# Patient Record
Sex: Male | Born: 1945 | Race: Black or African American | Hispanic: No | Marital: Married | Smoking: Former smoker
Health system: Southern US, Community
[De-identification: ages and names within clinical notes are randomized; demographics above are authoritative.]

## PROBLEM LIST (undated history)

## (undated) ENCOUNTER — Emergency Department (HOSPITAL_COMMUNITY): Admission: EM | Disposition: A | Discharge status: Home / Self Care

## (undated) DIAGNOSIS — I10 Essential (primary) hypertension: Secondary | ICD-10-CM

## (undated) DIAGNOSIS — R519 Headache, unspecified: Secondary | ICD-10-CM

## (undated) DIAGNOSIS — R51 Headache: Secondary | ICD-10-CM

## (undated) DIAGNOSIS — N400 Enlarged prostate without lower urinary tract symptoms: Secondary | ICD-10-CM

## (undated) DIAGNOSIS — M1990 Unspecified osteoarthritis, unspecified site: Secondary | ICD-10-CM

## (undated) HISTORY — PX: JOINT REPLACEMENT: SHX530

---

## 1998-06-08 ENCOUNTER — Ambulatory Visit (HOSPITAL_BASED_OUTPATIENT_CLINIC_OR_DEPARTMENT_OTHER): Admission: RE | Admit: 1998-06-08 | Discharge: 1998-06-08 | Payer: Self-pay | Admitting: Orthopedic Surgery

## 2000-05-14 HISTORY — PX: BACK SURGERY: SHX140

## 2001-01-06 ENCOUNTER — Encounter: Payer: Self-pay | Admitting: Family Medicine

## 2001-01-06 ENCOUNTER — Ambulatory Visit (HOSPITAL_COMMUNITY): Admission: RE | Admit: 2001-01-06 | Discharge: 2001-01-06 | Payer: Self-pay | Admitting: Family Medicine

## 2001-04-03 ENCOUNTER — Inpatient Hospital Stay (HOSPITAL_COMMUNITY): Admission: RE | Admit: 2001-04-03 | Discharge: 2001-04-07 | Payer: Self-pay | Admitting: Neurosurgery

## 2002-11-27 ENCOUNTER — Ambulatory Visit (HOSPITAL_COMMUNITY): Admission: RE | Admit: 2002-11-27 | Discharge: 2002-11-27 | Payer: Self-pay | Admitting: Gastroenterology

## 2002-11-27 ENCOUNTER — Encounter (INDEPENDENT_AMBULATORY_CARE_PROVIDER_SITE_OTHER): Payer: Self-pay | Admitting: *Deleted

## 2004-05-14 HISTORY — PX: KNEE ARTHROSCOPY: SHX127

## 2004-05-23 ENCOUNTER — Ambulatory Visit (HOSPITAL_COMMUNITY): Admission: RE | Admit: 2004-05-23 | Discharge: 2004-05-23 | Payer: Self-pay | Admitting: Orthopedic Surgery

## 2004-05-23 ENCOUNTER — Ambulatory Visit (HOSPITAL_BASED_OUTPATIENT_CLINIC_OR_DEPARTMENT_OTHER): Admission: RE | Admit: 2004-05-23 | Discharge: 2004-05-23 | Payer: Self-pay | Admitting: Orthopedic Surgery

## 2007-05-15 HISTORY — PX: SHOULDER ARTHROSCOPY WITH OPEN ROTATOR CUFF REPAIR: SHX6092

## 2007-06-03 ENCOUNTER — Ambulatory Visit (HOSPITAL_BASED_OUTPATIENT_CLINIC_OR_DEPARTMENT_OTHER): Admission: RE | Admit: 2007-06-03 | Discharge: 2007-06-04 | Payer: Self-pay | Admitting: Orthopedic Surgery

## 2010-09-26 NOTE — Op Note (Signed)
NAMECONSTANTINE, RUDDICK                 ACCOUNT NO.:  [id number]   MEDICAL RECORD NO.:  [id number]          PATIENT TYPE:  AMB   LOCATION:  DSC                          FACILITY:  [facility]   PHYSICIAN:  Katy Fitch. Sypher, M.D. 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   OPERATION:  1. Examination of right shoulder under anesthesia documenting      stability of the glenohumeral joint.  2. Arthroscopic evaluation of the glenohumeral joint, followed by      debridement of labrum and deep surface of rotator cuff tear, also      documenting 20% degenerative tear of biceps tendon just proximal to      rotator interval.  3. Arthroscopic subacromial decompression with bursectomy,      coracoacromial ligament relaxation and acromioplasty.  4. Arthroscopic resection of distal clavicle.  5. Open reconstruction of retracted rotator cuff tear including entire      supraspinatus and infraspinatus tendons.  This tear was      approximately 5 cm wide.   INDICATIONS:  Christoper Bushey is a 65 year old gentleman referred through  the courtesy of Dr. Catha Gosselin for evaluation and management of a  painful right shoulder.  Clinical examination revealed signs of  impingement and weakness of abduction.  Plain x-rays documented AC  arthritis.  There was sclerosis of the greater tuberosity bony with  overgrowth consistent with a chronic rotator cuff tear.  An MRI of the  right shoulder was obtained documenting a retracted, significant rotator  cuff tear and AC  degenerative arthritis.   After informed consent, Mr. Baize is brought to the operating room at  this time.   PROCEDURE:  Tanav Orsak is brought to the operating room and placed in a  supine position on the operating table.   Following an anesthesia consult by Dr. Sampson Goon in the holding area, a  right interscalene block was placed with excellent anesthesia of the  right arm and forequarter.  It appeared that a phrenic nerve block had  occurred as Mr. Bowring did notice slight air hunger.   After detailed informed consent during which questions were invited and  answered, Mr. Legan was transferred to room 6 at the [facility] surgical  center and under Dr. Jarrett Ables direct supervision placed under  general endotracheal anesthesia.  He was carefully positioned in the  beach-chair position with the aid of a torso and head holder designed  for shoulder arthroscopy.   The entire right upper extremity and forequarter were prepped with  DuraPrep and draped with impervious arthroscopy drapes.   The procedure commenced with examination of the shoulder under  anesthesia.  His combined elevation was 180 degrees, external rotation  of  95 degrees, internal rotation of 80.  He was stable in all planes of  stress testing.   The arthroscope was then introduced through a standard posterior viewing  portal.  Diagnostic arthroscopy revealed intact hyaline articular  cartilage surfaces on the glenoid and humeral head.  The labrum was  degenerative from 2 o'clock anteriorly to 10 o'clock posteriorly.  An  anterior portal was created under direct vision, followed by debridement  of the labrum.  The rotator cuff was examined.  The subscapularis was  intact.  The supraspinatus was completely released from the greater  tuberosity and degenerative.  The infraspinatus was completely released  and the greater tuberosity retracted and degenerative.  The teres minor  was intact.  The biceps had a 20%  undersurface tear.  The 4.5-mm suction  shaver was used through the anterior portal to debride the biceps and  deep surface of the rotator cuff.  Synovitis within the joint was  debrided.  Hemostasis was achieved with bipolar cautery, followed by  removal of the arthroscopic equipment.  The scope was placed in the  subacromial space.  After bursectomy, the coracoacromial ligament was  released with the cutting cautery, followed by leveling of the acromion  to a type 1 morphology.  The capsule of the [facility] joint was worn through  and the spur at the distal clavicle was documented with the digital  camera.  After release of the coracoacromial ligament, a significant  spur was noted at the anterior acromion that had grown within the  ligament.  The acromial spur was leveled, as was the spur at the  inferior aspect of the distal clavicle.  The distal 15 mm of clavicle  was removed arthroscopically with the suction bur.  Hemostasis was  achieved with the bipolar cautery.   As this tear was significantly retracted and degenerative, in my  judgment open repair had greater merit with through-bone suture  technique.  Therefore, we remove the arthroscopic equipment and  proceeded with a 4-cm muscle-splitting incision.  The anterior and  middle thirds of the deltoid were split, followed by identification of  the tear.  The edges were freshened with a scalpel and rongeur, followed  by placement of a grasping McLaughlin through-bone suture that was  brought through drill holes over the lateral cortex of the humerus.  To  biocorkscrew anchors, one placed at the medial footprint of the  infraspinatus, one at the medial footprint the supraspinatus, were  placed.  Suture placement was carefully done to avoid the long head of  the biceps.   The tear was advanced laterally to an anatomic footprint with the  [facility] through-bone suture, followed by tying four mattress sutures,  insetting the margin of the  repair.  The suture pairs anteriorly and  posteriorly were then used with an over-the-top technique utilizing a  swivel lock, insetting the repair on the greater tuberosity.   A very satisfactory construct was achieved.   The subacromial space was then thoroughly irrigated with the power  irrigation, followed by use of a hand bur to feather the edge of the  acromion.  The deltoid split was then repaired with a corner suture of  #2 FiberWire, followed by simple suture of 0 Vicryl.  The skin was  repaired with subdermal suture of 2-0 Vicryl and intradermal 3-0 Prolene  with Steri-Strips.  Mr. Klee was placed in a sling and awakened from  general anesthesia, subsequently transferred to the Post Anesthesia Care  Unit for observation  of his vital signs.  We anticipate discharge to the  recovery care center for observation for 23 hours.  During this period  of time he will be provided Ancef 1 g IV q.8h.  x3 doses and appropriate analgesics in the form of p.o. and IV Dilaudid  as well as possible use of IV PCA morphine.  There were no apparent  complications.  Passive calf compression pumps were used for deep vein  thrombosis prophylaxis.  We will mobilize Mr. Gerding immediately  postoperatively.      Katy Fitch Sypher, M.D.  Electronically Signed     RVS/MEDQ  D:  06/03/2007  T:  06/04/2007  Job:  [id number]   cc:   Caryn Bee L. Little, M.D.

## 2010-09-29 NOTE — Op Note (Signed)
NAMECAMDON, SAETERN                 ACCOUNT NO.:  [id number]   MEDICAL RECORD NO.:  [id number]          PATIENT TYPE:  AMB   LOCATION:  DSC                          FACILITY:  [facility]   PHYSICIAN:  Feliberto Gottron. Turner Daniels, M.D.   DATE OF BIRTH:  [birth year]   DATE OF PROCEDURE:  05/23/2004  DATE OF DISCHARGE:                                 OPERATIVE REPORT   New that this Homero Fellers running the pain.   OPERATIVE NOTE:   PREOPERATIVE DIAGNOSIS:  Left knee medial meniscal tear, focal grade III.   POSTOPERATIVE DIAGNOSIS:  Left knee medial meniscal tear, focal grade III.   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   SURGEON:  Feliberto Gottron. Turner Daniels, M.D.   FIRST ASSISTANT:  None.   ANESTHETIC:  Local with IV sedation.   ESTIMATED BLOOD LOSS:  Minimal.   FLUID REPLACEMENT:  800 cc crystalloid.   TOURNIQUET TIME:  None.   INDICATIONS FOR PROCEDURE:  The patient is a 65 year old gentleman who has  had prior arthroscopy of his left knee some years ago,  Retwisted or  reinjured his knee recently, has had a recurrent medial joint line pain,  catching and popping.  Plain radiographs showed fairly normal cartilage site  but his signs and symptoms are classic for a medial meniscal tear.  He has  failed conservative treatment  with anti-inflammatory medicines,  observation,  cortisone injection provided temporary relief. He now desires  repeat elective arthroscopic evaluation and treatment of his left knee  accepting the risks, benefits of surgery.   DESCRIPTION OF PROCEDURE:  The patient identified by armband, taken the  operating room at [facility]. [facility].  Appropriate anesthetic monitors were attached. Local anesthesia with IV  sedation was induced with the patient in supine position and the left lower  extremity prepped and draped in usual sterile fashion from the ankle  to the  mid thigh.  Lateral post was applied to the table prior to the prep and  drape. Using a #11 blade standard inferomedial and inferolateral  peripatellar portals were then made allowing introduction of the arthroscope  through the inferolateral portal and the outflow through the inferomedial  portal. Diagnostic arthroscopy revealed a fairly normal patellofemoral  joint. Moving of medial side grade III chondromalacia near  the notch was  identified and debrided.  A posterior medial fairly big tear the medial  meniscus was identified debrided with straight biters, a 3.5 gator sucker  shaver as well as a 4.4 [place]  sucker shaver. The ACL was noted to be  intact on the lateral side.  There was an incidental fraying of the  lateral  meniscus which was lightly debrided. The  knee was then irrigated out with normal saline solution and the gutters were  cleared medially and laterally as were the posterior horns. The arthroscopic  instruments were removed. A dressing of Xeroform, 4x4 dressing sponges,  Webril and Ace wrap applied. The patient was then awakened and taken to the  recovery room without difficulty.      Emilio Aspen  D:  05/23/2004  T:  05/23/2004  Job:  [id number]

## 2010-09-29 NOTE — Op Note (Signed)
[place]. [facility]  Patient:    KIZER, NOBBE Visit Number: [id number] MRN: [id number]          Service Type: SUR Location: 3000 [id number] Attending Physician:  Cristi Loron Dictated by:   Cristi Loron, M.D. Proc. Date: 04/03/01 Admit Date:  04/03/2001                             Operative Report  PREOPERATIVE DIAGNOSES:  L2-3, L3-4, L4-5, L5-S1 spinal stenosis, degenerative disk disease, spondylosis, lumbago, lumbar radiculopathy.  POSTOPERATIVE DIAGNOSES:  L2-3, L3-4, L4-5, L5-S1 spinal stenosis, degenerative disk disease, spondylosis, lumbago, lumbar radiculopathy.  PROCEDURES: 1. Bilateral L3, L4, and L5 extensive laminectomy. 2. Bilateral laminotomy and foraminotomy at L2 using microdissection.  SURGEON:  Cristi Loron, M.D.  ANESTHESIA:  General endotracheal.  ESTIMATED BLOOD LOSS:  300 cc.  SPECIMENS:  None.  DRAINS:  None.  COMPLICATIONS:  None.  BRIEF HISTORY:  The patient is a 65 year old black male who suffers from back and leg pain.  He failed medical management and was worked up as an outpatient with a lumbar MRI.  It demonstrated significant lumbar spinal stenosis.  I therefore discussed treatment options with him and he decided to proceed with a decompressive lumbar laminectomy.  DESCRIPTION OF PROCEDURE:  The patient was brought to the operating room by the anesthesia team.  General endotracheal anesthesia was induced.  The patient was then turned to the prone position on the Wilson frame.  His lumbosacral region was then prepared with Betadine scrub and Betadine solution and sterile drapes were applied.  I then injected the area to be incised with Marcaine with epinephrine solution.  I used a scalpel to make a midline incision over the spinous processes of L2 to the sacrum.  I used electrocautery to dissect down to the thoracolumbar fascia and divided it bilaterally, performing a bilateral subperiosteal  dissection, stripping the paraspinous musculature from the spinous processes and laminae of L2 down to the upper sacrum.  I inserted the McCullough retractor for exposure.  I then obtained an intraoperative radiograph to confirm our location.  I then used to rongeurs to remove the spinous process of L3, L4, and L5.  I then used the high-speed drill to perform bilateral laminotomies at L2, L3, L4, and L5.  I then brought the operating microscope into the field and under its magnification and illumination, I completed the decompression.  I used the Kerrison punch to remove the L5-S1 ligamentum flavum.  I then completed the L5 laminectomy and removed the L4-5 ligamentum flavum, then completed the L4 laminectomy and removed the L3-4 ligamentum flavum, and then completed the L3 laminectomy and then removed the L2-3 ligamentum flavum.  I then used microdissection to free up the thecal sac and the nerve roots from the hypertrophied ligamentum flavum and facet joints and in the lateral recesses bilaterally and then used the Kerrison punch to remove the hypertrophied ligament and medial facet bilaterally, decompressing the thecal sac from L2 to the upper sacrum and performing foraminotomies about the bilateral L3, L4, L5, and S1 nerve roots.  I then palpated along the exit route of the nerve roots bilaterally with a nerve hook and noted that they were well-decompressed.  Of note, the worst area of compression was around the right L5 nerve root, around which there was severe foraminal stenosis, which I opened up using the Kerrison punch.  I then inspected the  dura and noted that there was a small thinned area at L4-5 posteriorly.  There was no spinal fluid leaking, and I laid a piece of DuraGen over the area of thinned dura.  I then copiously irrigated the wound with bacitracin solution and removed the solution.  I achieved stringent hemostasis using bipolar electrocautery and removed the McCullough  retractor and reapproximated the patients thoracolumbar fascia with interrupted 3-0 Vicryl suture, the subcutaneous tissue with interrupted 2-0 Vicryl suture, and the skin with Steri-Strips and benzoin.  The wound was then coated with bacitracin ointment, and a sterile dressing was applied, the drapes were removed, and the patient was subsequently returned to the supine position, where he was extubated by the anesthesia team and transported to the postanesthesia care unit in stable condition.  All sponge, instrument, and needle counts were correct at the end of the case. Dictated by:   Cristi Loron, M.D. Attending Physician:  Tressie Stalker D DD:  04/03/01 TD:  04/03/01 Job: [id number]

## 2010-09-29 NOTE — Op Note (Signed)
   NAME:  Jared [name], Jared [name]                           ACCOUNT NO.:  [id number]   MEDICAL RECORD NO.:  [id number]                   PATIENT TYPE:  AMB   LOCATION:  ENDO                                 FACILITY:  [facility]   PHYSICIAN:  Petra Kuba, M.D.                 DATE OF BIRTH:  [birth year]   DATE OF PROCEDURE:  11/27/2002  DATE OF DISCHARGE:                                 OPERATIVE REPORT   PROCEDURE:  Colonoscopy with polypectomy.   INDICATIONS:  History of colon polyp, family history of colon cancer in a  sister at a young age.  Consent was signed after risks, benefits, methods,  and options thoroughly discussed in the office in the past.   MEDICINES USED:  Demerol 60 mg, Versed 6 mg.   PROCEDURE:  Rectal inspection was pertinent for external hemorrhoids, small.  Digital exam was negative.  The video regular colonoscope was inserted and  then fairly easily advanced around the colon to the cecum.  This did require  some abdominal pressure and no position changes.  No abnormalities were seen  on insertion.  The cecum was identified by the appendiceal orifice and the  ileocecal valve.  Prep was fairly adequate, did require some washing and  suctioning for adequate visualization.  On slow withdrawal through the  colon, the cecum, ascending, and transverse were normal.  There was a rare  occasional left-sided diverticulum seen as we slowly withdrew back to the  rectum and there were also four tiny to small left-sided polyps, which were  all hot biopsied and put in the same container.  Anorectal pull-through and  retroflexion confirm small hemorrhoids.  The scope was straightened and  readvanced a short way into the left side of the colon, air was suctioned,  the scope removed.  The patient tolerated the procedure well.  There was no  obvious immediate complication.   ENDOSCOPIC DIAGNOSES:  1. Internal-external hemorrhoids.  2. Occasional rare left-sided diverticula.  3. Four tiny to  small left-sided polyps, hot biopsied.  4. Otherwise within normal limits to the cecum.   PLAN:  Happy to see back p.r.n.  Return care to Dr. Clarene Duke for the customary  health care maintenance to include yearly rectals and guaiacs, and otherwise  await pathology to determine future colonic screening.                                               Petra Kuba, M.D.    MEM/MEDQ  D:  11/27/2002  T:  11/28/2002  Job:  [id number]   cc:   Caryn Bee L. Little, M.D.  [address]  [place]  [place] [address]  Fax: [phone]

## 2010-09-29 NOTE — Discharge Summary (Signed)
[place]. [facility]  Patient:    RESHAWN, OSTLUND Visit Number: [id number] MRN: [id number]          Service Type: SUR Location: 3000 [id number] Attending Physician:  Cristi Loron Dictated by:   Mena Goes. Franky Macho, M.D. Admit Date:  04/03/2001 Discharge Date: 04/07/2001                             Discharge Summary  ADMISSION DIAGNOSIS:  Right leg pain, numbness, and weakness.  DISCHARGE DIAGNOSIS:  Right leg pain, numbness, weakness, and lumbar spinal stenosis.  PROCEDURE:  L2-5 decompressive laminectomy.  COMPLICATIONS:  Postoperative fever.  HISTORY OF PRESENT ILLNESS:  Mr. Dambrosia is a 65 year old gentleman who was admitted to the hospital for right lower extremity pain.  He had spinal stenosis on his MRI and degenerative disk disease at L3-4 and L4-5 and L5-S1. Dr. Lovell Sheehan felt that his decompressive laminectomy would be beneficial and Mr. Weed agreed.  HOSPITAL COURSE:  He underwent simple procedure without difficulty. Postoperatively, Mr. Paolucci had normal neurologic examination.  His wound was clean, dry, and without signs of infection.  He did have postoperative fever. Urine culture was done and was negative.  Lung films showed some atelectasis. He had a white count of 8.5 versus 6.1 on admission.  He will be discharged home.  He is afebrile this morning at 97.1.  He will call for return appointment with Dr. Lovell Sheehan in about three weeks.  He was given instructions on heavy lifting, bending, or twisting, no driving. Dictated by:   Mena Goes. Franky Macho, M.D. Attending Physician:  Tressie Stalker D DD:  04/07/01 TD:  04/07/01 Job: [id number]

## 2011-02-01 LAB — BASIC METABOLIC PANEL
BUN: 14
CO2: 28
Calcium: 9.3
Chloride: 105
Creatinine, Ser: 1.21
GFR calc Af Amer: >60
GFR calc non Af Amer: >60
Glucose, Bld: 101 — ABNORMAL HIGH
Potassium: 4.4
Sodium: 139

## 2011-02-01 LAB — POCT HEMOGLOBIN-HEMACUE: Hemoglobin: 14.4

## 2011-04-02 ENCOUNTER — Encounter (HOSPITAL_COMMUNITY): Payer: Self-pay | Admitting: Pharmacy Technician

## 2011-04-09 ENCOUNTER — Encounter (HOSPITAL_COMMUNITY): Payer: Self-pay

## 2011-04-09 ENCOUNTER — Other Ambulatory Visit: Payer: Self-pay

## 2011-04-09 ENCOUNTER — Encounter (HOSPITAL_COMMUNITY)
Admission: RE | Admit: 2011-04-09 | Discharge: 2011-04-09 | Disposition: A | Discharge status: Home / Self Care | Payer: Medicare Other | Source: Ambulatory Visit | Attending: Anesthesiology | Admitting: Anesthesiology

## 2011-04-09 ENCOUNTER — Encounter (HOSPITAL_COMMUNITY)
Admission: RE | Admit: 2011-04-09 | Discharge: 2011-04-09 | Disposition: A | Discharge status: Home / Self Care | Payer: Medicare Other | Source: Ambulatory Visit | Attending: Orthopedic Surgery | Admitting: Orthopedic Surgery

## 2011-04-09 HISTORY — DX: Essential (primary) hypertension: I10

## 2011-04-09 HISTORY — DX: Unspecified osteoarthritis, unspecified site: M19.90

## 2011-04-09 LAB — CBC
HCT: 42.4 % (ref 39.0–52.0)
Hemoglobin: 14.6 g/dL (ref 13.0–17.0)
MCV: 91.2 fL (ref 78.0–100.0)
RDW: 12.6 % (ref 11.5–15.5)
WBC: 5.8 10*3/uL (ref 4.0–10.5)

## 2011-04-09 LAB — SURGICAL PCR SCREEN
MRSA, PCR: NEGATIVE
Staphylococcus aureus: NEGATIVE

## 2011-04-09 LAB — BASIC METABOLIC PANEL
BUN: 16 mg/dL (ref 6–23)
Chloride: 104 mEq/L (ref 96–112)
Creatinine, Ser: 1.33 mg/dL (ref 0.50–1.35)
Glucose, Bld: 88 mg/dL (ref 70–99)
Potassium: 4 mEq/L (ref 3.5–5.1)

## 2011-04-09 LAB — TYPE AND SCREEN
ABO/RH(D): O POS
Antibody Screen: NEGATIVE

## 2011-04-09 NOTE — Pre-Procedure Instructions (Signed)
20 Jared [name]  04/09/2011   Your procedure is scheduled on:  04-16-11 Monday  Report to [facility] Short Stay Center at 8:30 AM.  Call this number if you have problems the morning of surgery: [phone]   Remember:   Do not eat food:After Midnight.  May have clear liquids: up to 4 Hours before arrival.  Clear liquids include soda, tea, black coffee, apple or grape juice, broth.  Take these medicines the morning of surgery with A SIP OF WATER: amlodipine   Do not wear jewelry, make-up or nail polish.  Do not wear lotions, powders, or perfumes. You may wear deodorant.  Do not shave 48 hours prior to surgery.  Do not bring valuables to the hospital.  Contacts, dentures or bridgework may not be worn into surgery.  Leave suitcase in the car. After surgery it may be brought to your room.  For patients admitted to the hospital, checkout time is 11:00 AM the day of discharge.   Patients discharged the day of surgery will not be allowed to drive home.  Name and phone number of your driver: gwen Bergen - wife  [phone]  Special Instructions: CHG Shower Use Special Wash: 1/2 bottle night before surgery and 1/2 bottle morning of surgery.   Please read over the following fact sheets that you were given: Pain Booklet, Coughing and Deep Breathing, Blood Transfusion Information, MRSA Information and Surgical Site Infection Prevention

## 2011-04-10 ENCOUNTER — Other Ambulatory Visit: Payer: Self-pay | Admitting: Orthopedic Surgery

## 2011-04-12 NOTE — Progress Notes (Signed)
Rogelia Mire RN left message for [place] at Dr. Wadie Lessen office at 1025 today about no orders in [employer] or  [address]

## 2011-04-12 NOTE — H&P (Signed)
   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  He comes in today having pretty much reached his wit's end.  He works as both a Education officer, environmental and a custodian.  He is now having a great deal of trouble doing his job.  The pain wakes him up at night.  He has trouble getting in and out of a chair and if anything the varus deformity had previously has progressed.  PAST MEDICAL HISTORY:  Unchanged regarding anything like a stroke, heart attack.  He has no history of diabetes.  He is treated for high blood pressure.  Review of systems reviewed thoroughly and all other systems are negative as related to the chief complaint.  OBJECTIVE:  Well-nourished, well-developed 65 year old gentleman seated on the exam table in no apparent distress.  When he tries to walk he has obvious varus deformities, left greater than right knee. Both knees lack 10 degrees of full extension.  They flex to about 110 degrees.  Trace effusion on the left, none on the right.  Very tender along the medial joint line.  Hawkins test is negative.  RADIOGRAPHS:  X-rays were ordered, performed, and interpreted by me today.  Plain radiographs show end-stage arthritis, bone-on-bone, with actually some gas on the medial side of the joint on both knees and on the lateral view of the left knee you can actually see erosion of the femur into the tibia.  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  PLAN:  Options were discussed at length.  He has already failed anti-inflammatory medicines, cortisone injections.  He does not want to do Supartz injection because of the low success rate.  Therefore, we will get him set up for left total knee arthroplasty.  We will try to get that done at [facility] sooner than later.  Risks and benefits of surgery have been discussed and questioned.  Models are brought into the room and he is aware of what he is  getting himself into.

## 2011-04-13 ENCOUNTER — Other Ambulatory Visit: Payer: Self-pay | Admitting: Orthopedic Surgery

## 2011-04-13 NOTE — Progress Notes (Signed)
Left message on the voice mail of Jared [name](?) at Dr Wadie Lessen office and requested surgical orders again.

## 2011-04-16 ENCOUNTER — Encounter (HOSPITAL_COMMUNITY)
Admission: RE | Disposition: A | Discharge status: Home Health | Payer: Self-pay | Source: Ambulatory Visit | Attending: Orthopedic Surgery

## 2011-04-16 ENCOUNTER — Inpatient Hospital Stay (HOSPITAL_COMMUNITY): Payer: Medicare Other | Admitting: Anesthesiology

## 2011-04-16 ENCOUNTER — Inpatient Hospital Stay (HOSPITAL_COMMUNITY)
Admission: RE | Admit: 2011-04-16 | Discharge: 2011-04-18 | DRG: 470 | Disposition: A | Discharge status: Home Health | Payer: Medicare Other | Source: Ambulatory Visit | Attending: Orthopedic Surgery | Admitting: Orthopedic Surgery

## 2011-04-16 ENCOUNTER — Encounter (HOSPITAL_COMMUNITY): Payer: Self-pay | Admitting: Anesthesiology

## 2011-04-16 ENCOUNTER — Encounter (HOSPITAL_COMMUNITY): Payer: Self-pay | Admitting: *Deleted

## 2011-04-16 ENCOUNTER — Encounter (HOSPITAL_COMMUNITY): Payer: Self-pay | Admitting: Orthopedic Surgery

## 2011-04-16 DIAGNOSIS — M1712 Unilateral primary osteoarthritis, left knee: Secondary | ICD-10-CM

## 2011-04-16 DIAGNOSIS — I10 Essential (primary) hypertension: Secondary | ICD-10-CM | POA: Diagnosis present

## 2011-04-16 DIAGNOSIS — Z01812 Encounter for preprocedural laboratory examination: Secondary | ICD-10-CM

## 2011-04-16 DIAGNOSIS — M1710 Unilateral primary osteoarthritis, unspecified knee: Principal | ICD-10-CM | POA: Diagnosis present

## 2011-04-16 DIAGNOSIS — Z79899 Other long term (current) drug therapy: Secondary | ICD-10-CM

## 2011-04-16 DIAGNOSIS — Z7901 Long term (current) use of anticoagulants: Secondary | ICD-10-CM

## 2011-04-16 HISTORY — PX: TOTAL KNEE ARTHROPLASTY: SHX125

## 2011-04-16 LAB — PROTIME-INR
INR: 1.08 (ref 0.00–1.49)
Prothrombin Time: 14.2 seconds (ref 11.6–15.2)

## 2011-04-16 LAB — DIFFERENTIAL
Basophils Absolute: 0 10*3/uL (ref 0.0–0.1)
Basophils Relative: 0 % (ref 0–1)
Eosinophils Absolute: 0.3 10*3/uL (ref 0.0–0.7)
Eosinophils Relative: 5 % (ref 0–5)
Lymphocytes Relative: 64 % — ABNORMAL HIGH (ref 12–46)
Monocytes Absolute: 0.3 10*3/uL (ref 0.1–1.0)

## 2011-04-16 SURGERY — ARTHROPLASTY, KNEE, TOTAL
Anesthesia: Regional | Site: Knee | Laterality: Left | Wound class: Clean

## 2011-04-16 MED ORDER — THERA M PLUS PO TABS
1.0000 | ORAL_TABLET | Freq: Every day | ORAL | Status: DC
  Administered 2011-04-16 – 2011-04-18 (×3): 1 via ORAL
  Filled 2011-04-16 (×3): qty 1

## 2011-04-16 MED ORDER — HYDROMORPHONE HCL PF 1 MG/ML IJ SOLN
0.5000 mg | INTRAMUSCULAR | Status: DC | PRN
  Administered 2011-04-16 – 2011-04-17 (×4): 1 mg via INTRAVENOUS
  Filled 2011-04-16 (×3): qty 1

## 2011-04-16 MED ORDER — PHENOL 1.4 % MT LIQD
1.0000 | OROMUCOSAL | Status: DC | PRN
  Filled 2011-04-16: qty 177

## 2011-04-16 MED ORDER — EPHEDRINE SULFATE 50 MG/ML IJ SOLN
INTRAMUSCULAR | Status: DC | PRN
  Administered 2011-04-16: 5 mg via INTRAVENOUS

## 2011-04-16 MED ORDER — BUPIVACAINE-EPINEPHRINE PF 0.5-1:200000 % IJ SOLN
INTRAMUSCULAR | Status: DC | PRN
  Administered 2011-04-16: 30 mL

## 2011-04-16 MED ORDER — PATIENT'S GUIDE TO USING COUMADIN BOOK
Freq: Once | Status: AC
  Administered 2011-04-16: 16:00:00
  Filled 2011-04-16: qty 1

## 2011-04-16 MED ORDER — TAMSULOSIN HCL 0.4 MG PO CAPS
0.4000 mg | ORAL_CAPSULE | Freq: Every day | ORAL | Status: DC
  Administered 2011-04-16 – 2011-04-18 (×3): 0.4 mg via ORAL
  Filled 2011-04-16 (×3): qty 1

## 2011-04-16 MED ORDER — PHENYLEPHRINE HCL 10 MG/ML IJ SOLN
INTRAMUSCULAR | Status: DC | PRN
  Administered 2011-04-16: 40 ug via INTRAVENOUS

## 2011-04-16 MED ORDER — ACETAMINOPHEN 650 MG RE SUPP: 650.0000 mg | Freq: Four times a day (QID) | RECTAL | Status: DC | PRN

## 2011-04-16 MED ORDER — SODIUM CHLORIDE 0.9 % IR SOLN
Status: DC | PRN
  Administered 2011-04-16: 3000 mL

## 2011-04-16 MED ORDER — CEFAZOLIN SODIUM 1-5 GM-% IV SOLN
INTRAVENOUS | Status: DC | PRN
  Administered 2011-04-16: 2 g via INTRAVENOUS

## 2011-04-16 MED ORDER — CHLORHEXIDINE GLUCONATE 4 % EX LIQD: 60.0000 mL | Freq: Once | CUTANEOUS | Status: DC

## 2011-04-16 MED ORDER — ONDANSETRON HCL 4 MG PO TABS: 4.0000 mg | ORAL_TABLET | Freq: Four times a day (QID) | ORAL | Status: DC | PRN

## 2011-04-16 MED ORDER — CEFUROXIME SODIUM 1.5 G IJ SOLR
INTRAMUSCULAR | Status: DC | PRN
  Administered 2011-04-16: 1.5 g

## 2011-04-16 MED ORDER — ALUM & MAG HYDROXIDE-SIMETH 200-200-20 MG/5ML PO SUSP: 30.0000 mL | ORAL | Status: DC | PRN

## 2011-04-16 MED ORDER — HYDROCODONE-ACETAMINOPHEN 5-325 MG PO TABS
1.0000 | ORAL_TABLET | ORAL | Status: DC | PRN
  Administered 2011-04-16: 2 via ORAL
  Filled 2011-04-16: qty 2

## 2011-04-16 MED ORDER — ONDANSETRON HCL 4 MG/2ML IJ SOLN
INTRAMUSCULAR | Status: DC | PRN
  Administered 2011-04-16: 4 mg via INTRAVENOUS

## 2011-04-16 MED ORDER — OXYCODONE-ACETAMINOPHEN 5-325 MG PO TABS
1.0000 | ORAL_TABLET | ORAL | Status: DC | PRN
Start: 2011-04-16 — End: 2011-04-18
  Administered 2011-04-17 – 2011-04-18 (×5): 2 via ORAL
  Filled 2011-04-16 (×5): qty 2

## 2011-04-16 MED ORDER — HYDROMORPHONE HCL PF 1 MG/ML IJ SOLN
INTRAMUSCULAR | Status: AC
  Administered 2011-04-17: 1 mg via INTRAVENOUS
  Filled 2011-04-16: qty 1

## 2011-04-16 MED ORDER — WARFARIN SODIUM 7.5 MG PO TABS
7.5000 mg | ORAL_TABLET | Freq: Once | ORAL | Status: AC
  Administered 2011-04-16: 7.5 mg via ORAL
  Filled 2011-04-16: qty 1

## 2011-04-16 MED ORDER — LACTATED RINGERS IV SOLN
INTRAVENOUS | Status: DC
  Administered 2011-04-16: 09:00:00 via INTRAVENOUS

## 2011-04-16 MED ORDER — KCL IN DEXTROSE-NACL 20-5-0.45 MEQ/L-%-% IV SOLN
INTRAVENOUS | Status: DC
  Administered 2011-04-16 – 2011-04-18 (×4): via INTRAVENOUS
  Filled 2011-04-16 (×8): qty 1000

## 2011-04-16 MED ORDER — FENTANYL CITRATE 0.05 MG/ML IJ SOLN
INTRAMUSCULAR | Status: DC | PRN
  Administered 2011-04-16: 25 ug via INTRAVENOUS
  Administered 2011-04-16: 100 ug via INTRAVENOUS
  Administered 2011-04-16: 25 ug via INTRAVENOUS
  Administered 2011-04-16: 50 ug via INTRAVENOUS
  Administered 2011-04-16 (×2): 25 ug via INTRAVENOUS

## 2011-04-16 MED ORDER — METOCLOPRAMIDE HCL 10 MG PO TABS: 5.0000 mg | ORAL_TABLET | Freq: Three times a day (TID) | ORAL | Status: DC | PRN

## 2011-04-16 MED ORDER — PROPOFOL 10 MG/ML IV EMUL
INTRAVENOUS | Status: DC | PRN
  Administered 2011-04-16: 200 mg via INTRAVENOUS

## 2011-04-16 MED ORDER — MENTHOL 3 MG MT LOZG: 1.0000 | LOZENGE | OROMUCOSAL | Status: DC | PRN

## 2011-04-16 MED ORDER — MIDAZOLAM HCL 5 MG/5ML IJ SOLN
INTRAMUSCULAR | Status: DC | PRN
  Administered 2011-04-16: 2 mg via INTRAVENOUS

## 2011-04-16 MED ORDER — ZOLPIDEM TARTRATE 5 MG PO TABS: 5.0000 mg | ORAL_TABLET | Freq: Every evening | ORAL | Status: DC | PRN

## 2011-04-16 MED ORDER — METHOCARBAMOL 100 MG/ML IJ SOLN
500.0000 mg | Freq: Four times a day (QID) | INTRAVENOUS | Status: DC | PRN
  Filled 2011-04-16: qty 5

## 2011-04-16 MED ORDER — HETASTARCH-ELECTROLYTES 6 % IV SOLN
INTRAVENOUS | Status: DC | PRN
  Administered 2011-04-16: 10:00:00 via INTRAVENOUS

## 2011-04-16 MED ORDER — AMLODIPINE BESYLATE 10 MG PO TABS
10.0000 mg | ORAL_TABLET | Freq: Every day | ORAL | Status: DC
  Administered 2011-04-17 – 2011-04-18 (×2): 10 mg via ORAL
  Filled 2011-04-16 (×2): qty 1

## 2011-04-16 MED ORDER — LACTATED RINGERS IV SOLN
INTRAVENOUS | Status: DC | PRN
  Administered 2011-04-16 (×2): via INTRAVENOUS

## 2011-04-16 MED ORDER — ENOXAPARIN SODIUM 30 MG/0.3ML ~~LOC~~ SOLN
30.0000 mg | Freq: Two times a day (BID) | SUBCUTANEOUS | Status: DC
  Administered 2011-04-17 – 2011-04-18 (×3): 30 mg via SUBCUTANEOUS
  Filled 2011-04-16 (×4): qty 0.3

## 2011-04-16 MED ORDER — DEXTROSE-NACL 5-0.45 % IV SOLN: INTRAVENOUS | Status: DC

## 2011-04-16 MED ORDER — METHOCARBAMOL 100 MG/ML IJ SOLN
500.0000 mg | Freq: Once | INTRAVENOUS | Status: AC
  Administered 2011-04-16: 500 mg via INTRAVENOUS
  Filled 2011-04-16: qty 5

## 2011-04-16 MED ORDER — ONDANSETRON HCL 4 MG/2ML IJ SOLN: 4.0000 mg | Freq: Four times a day (QID) | INTRAMUSCULAR | Status: DC | PRN

## 2011-04-16 MED ORDER — WARFARIN VIDEO: Freq: Once | Status: DC

## 2011-04-16 MED ORDER — METHOCARBAMOL 500 MG PO TABS
500.0000 mg | ORAL_TABLET | Freq: Four times a day (QID) | ORAL | Status: DC | PRN
  Administered 2011-04-16 – 2011-04-17 (×2): 500 mg via ORAL
  Filled 2011-04-16 (×3): qty 1

## 2011-04-16 MED ORDER — ACETAMINOPHEN 325 MG PO TABS
650.0000 mg | ORAL_TABLET | Freq: Four times a day (QID) | ORAL | Status: DC | PRN
  Administered 2011-04-18: 650 mg via ORAL
  Filled 2011-04-16: qty 2

## 2011-04-16 MED ORDER — METOCLOPRAMIDE HCL 5 MG/ML IJ SOLN
5.0000 mg | Freq: Three times a day (TID) | INTRAMUSCULAR | Status: DC | PRN
  Filled 2011-04-16: qty 2

## 2011-04-16 MED ORDER — DIPHENHYDRAMINE HCL 12.5 MG/5ML PO ELIX
12.5000 mg | ORAL_SOLUTION | ORAL | Status: DC | PRN
  Filled 2011-04-16: qty 10

## 2011-04-16 SURGICAL SUPPLY — 54 items
BANDAGE ELASTIC 6 VELCRO ST LF (GAUZE/BANDAGES/DRESSINGS) ×2 IMPLANT
BANDAGE ESMARK 6X9 LF (GAUZE/BANDAGES/DRESSINGS) ×1 IMPLANT
BLADE SAG 18X100X1.27 (BLADE) ×2 IMPLANT
BLADE SAW SGTL 13X75X1.27 (BLADE) ×2 IMPLANT
BLADE SURG ROTATE 9660 (MISCELLANEOUS) IMPLANT
BNDG ELASTIC 6X10 VLCR STRL LF (GAUZE/BANDAGES/DRESSINGS) IMPLANT
BNDG ESMARK 6X9 LF (GAUZE/BANDAGES/DRESSINGS) ×2
BOWL SMART MIX CTS (DISPOSABLE) ×2 IMPLANT
CEMENT HV SMART SET (Cement) ×4 IMPLANT
CLOTH BEACON ORANGE TIMEOUT ST (SAFETY) ×2 IMPLANT
COVER BACK TABLE 24X17X13 BIG (DRAPES) IMPLANT
COVER SURGICAL LIGHT HANDLE (MISCELLANEOUS) ×2 IMPLANT
CUFF TOURNIQUET SINGLE 34IN LL (TOURNIQUET CUFF) ×2 IMPLANT
CUFF TOURNIQUET SINGLE 44IN (TOURNIQUET CUFF) IMPLANT
DRAPE EXTREMITY T 121X128X90 (DRAPE) ×2 IMPLANT
DRAPE U-SHAPE 47X51 STRL (DRAPES) ×2 IMPLANT
DURAPREP 26ML APPLICATOR (WOUND CARE) ×2 IMPLANT
ELECT REM PT RETURN 9FT ADLT (ELECTROSURGICAL) ×2
ELECTRODE REM PT RTRN 9FT ADLT (ELECTROSURGICAL) ×1 IMPLANT
EVACUATOR 1/8 PVC DRAIN (DRAIN) ×2 IMPLANT
GAUZE XEROFORM 1X8 LF (GAUZE/BANDAGES/DRESSINGS) ×2 IMPLANT
GLOVE BIO SURGEON STRL SZ7 (GLOVE) ×2 IMPLANT
GLOVE BIO SURGEON STRL SZ7.5 (GLOVE) ×2 IMPLANT
GLOVE BIOGEL PI IND STRL 7.0 (GLOVE) ×1 IMPLANT
GLOVE BIOGEL PI IND STRL 8 (GLOVE) ×1 IMPLANT
GLOVE BIOGEL PI INDICATOR 7.0 (GLOVE) ×1
GLOVE BIOGEL PI INDICATOR 8 (GLOVE) ×1
GOWN PREVENTION PLUS XLARGE (GOWN DISPOSABLE) ×2 IMPLANT
GOWN STRL NON-REIN LRG LVL3 (GOWN DISPOSABLE) ×4 IMPLANT
HANDPIECE INTERPULSE COAX TIP (DISPOSABLE) ×1
HOOD PEEL AWAY FACE SHEILD DIS (HOOD) ×4 IMPLANT
KIT BASIN OR (CUSTOM PROCEDURE TRAY) ×2 IMPLANT
KIT ROOM TURNOVER OR (KITS) ×2 IMPLANT
MANIFOLD NEPTUNE II (INSTRUMENTS) ×2 IMPLANT
NS IRRIG 1000ML POUR BTL (IV SOLUTION) ×2 IMPLANT
PACK TOTAL JOINT (CUSTOM PROCEDURE TRAY) ×2 IMPLANT
PAD ARMBOARD 7.5X6 YLW CONV (MISCELLANEOUS) ×2 IMPLANT
PADDING CAST COTTON 6X4 STRL (CAST SUPPLIES) IMPLANT
PADDING WEBRIL 6 STERILE (GAUZE/BANDAGES/DRESSINGS) ×2 IMPLANT
SET HNDPC FAN SPRY TIP SCT (DISPOSABLE) ×1 IMPLANT
SPONGE GAUZE 4X4 12PLY (GAUZE/BANDAGES/DRESSINGS) ×4 IMPLANT
STAPLER VISISTAT 35W (STAPLE) ×2 IMPLANT
SUCTION FRAZIER TIP 10 FR DISP (SUCTIONS) ×2 IMPLANT
SURGIFLO TRUKIT (HEMOSTASIS) IMPLANT
SUT VIC AB 0 CTX 36 (SUTURE) ×1
SUT VIC AB 0 CTX36XBRD ANTBCTR (SUTURE) ×1 IMPLANT
SUT VIC AB 1 CTX 36 (SUTURE) ×1
SUT VIC AB 1 CTX36XBRD ANBCTR (SUTURE) ×1 IMPLANT
SUT VIC AB 2-0 CT1 27 (SUTURE) ×1
SUT VIC AB 2-0 CT1 TAPERPNT 27 (SUTURE) ×1 IMPLANT
TOWEL OR 17X24 6PK STRL BLUE (TOWEL DISPOSABLE) ×2 IMPLANT
TOWEL OR 17X26 10 PK STRL BLUE (TOWEL DISPOSABLE) ×2 IMPLANT
TRAY FOLEY CATH 14FR (SET/KITS/TRAYS/PACK) ×2 IMPLANT
WATER STERILE IRR 1000ML POUR (IV SOLUTION) ×2 IMPLANT

## 2011-04-16 NOTE — H&P (View-Only) (Signed)
Cynthia Michael RN left message for Cathy at Dr. Rowan's office at 1025 today about no orders in [employer] or  [employer]. 

## 2011-04-16 NOTE — Progress Notes (Signed)
ANTICOAGULATION CONSULT NOTE - Initial Consult  Pharmacy Consult for Coumadin Indication: VTE prophylaxis  No Known Allergies  Vital Signs: Temp: 99.3 F (37.4 C) (12/03 1500) Temp src: Oral (12/03 0752) BP: 135/65 mmHg (12/03 1513) Pulse Rate: 71  (12/03 1522)  Labs: No results found for this basename: HGB:2,HCT:3,PLT:3,APTT:3,LABPROT:3,INR:3,HEPARINUNFRC:3,CREATININE:3,CKTOTAL:3,CKMB:3,TROPONINI:3 in the last 72 hours CrCl is unknown because there is no height on file for the current visit.  Medical History: Past Medical History  Diagnosis Date  . Arthritis     knees and back  . Hypertension     stress test done about 12 yrs ago - results wnl per pt.    Medications:  Scheduled:    . methocarbamol(ROBAXIN) IV  500 mg Intravenous Once  . DISCONTD: chlorhexidine  60 mL Topical Once    Assessment: 18 YOM s/p left total knee arthroplasty to start coumadin for VTE prophylaxis. Pt not on anticoagulants prior to admission. No baseline cbc or PT/INR in chart  Goal of Therapy:  INR 2-3   Plan:  1) Start coumadin 7.5mg  po x 1 tonight 2) check baseline PT/INR 3) f/u daily INR  Riki Rusk 04/16/2011,3:50 PM

## 2011-04-16 NOTE — Anesthesia Procedure Notes (Addendum)
Anesthesia Regional Block:  Femoral nerve block  Pre-Anesthetic Checklist: ,, timeout performed, Correct Patient, Correct Site, Correct Laterality, Correct Procedure,, site marked, risks and benefits discussed, Surgical consent,  Pre-op evaluation,  At surgeon's request and post-op pain management  Laterality: Left  Prep: chloraprep       Needles:  Injection technique: Single-shot  Needle Type: Echogenic Stimulator Needle     Needle Length: 5cm 5 cm Needle Gauge: 22 and 22 G    Additional Needles:  Procedures: ultrasound guided and nerve stimulator Femoral nerve block  Nerve Stimulator or Paresthesia:  Response: quadraceps contraction, 0.45 mA,   Additional Responses:   Narrative:  Start time: 04/16/2011 8:41 AM End time: 04/16/2011 8:51 AM Injection made incrementally with aspirations every 5 mL.  Performed by: Personally  Anesthesiologist: Halford Decamp, MD  Additional Notes: Functioning IV was confirmed and monitors were applied.  A 50mm 22ga Arrow echogenic stimulator needle was used. Sterile prep and drape,hand hygiene and sterile gloves were used. Ultrasound guidance: relevent anatomy identified, needle position confirmed, local anesthetic spread visualized around nerve(s)., vascular puncture avoided.  Image printed for medical record. Negative aspiration and negative test dose prior to incremental administration of local anesthetic. The patient tolerated the procedure well.    Femoral nerve block Procedure Name: LMA Insertion Date/Time: 04/16/2011 9:19 AM Performed by: Margaree Mackintosh Pre-anesthesia Checklist: Patient identified, Emergency Drugs available, Suction available, Patient being monitored and Timeout performed Patient Re-evaluated:Patient Re-evaluated prior to inductionOxygen Delivery Method: Circle System Utilized Preoxygenation: Pre-oxygenation with 100% oxygen Intubation Type: IV induction LMA: LMA inserted LMA Size: 4.0 Number of attempts:  1 Placement Confirmation: positive ETCO2 and breath sounds checked- equal and bilateral Tube secured with: Tape Dental Injury: Teeth and Oropharynx as per pre-operative assessment

## 2011-04-16 NOTE — Op Note (Signed)
PATIENT ID:      KASHTEN GOWIN  MRN:     [id number] DOB/AGE:    1945/05/21 / 65 y.o.       OPERATIVE REPORT    DATE OF PROCEDURE:  04/16/2011       PREOPERATIVE DIAGNOSIS:   OSTEOARTHRITIS LEFT KNEE      There is no height or weight on file to calculate BMI.                                                        POSTOPERATIVE DIAGNOSIS:   OSTEOARTHRITIS LEFT KNEE                                                                      PROCEDURE:  Procedure(s): L TOTAL KNEE ARTHROPLASTY Using Depuy Sigma RP implants #5 Femur, #5Tibia, 10mm sigma RP bearing, 41 Patella     SURGEON: Rayne Loiseau J    ASSISTANT:   Shirl Harris PA-C   (Present and scrubbed throughout the case, critical for assistance with exposure, retraction, instrumentation, and closure.)         ANESTHESIA: GA combined with regional for post-op pain   DRAINS: (2) Hemovact drain(s) in the L knee with  Suction Open and Urinary Catheter (Foley)   TOURNIQUET TIME: *    EBL: Min   COMPLICATIONS:  None     SPECIMENS: None   INDICATIONS FOR PROCEDURE: The patient has  OSTEOARTHRITIS LEFT KNEE, varus deformities, XR shows bone on bone arthritis. Patient has failed all conservative measures including anti-inflammatory medicines, narcotics, attempts at  exercise and weight loss, cortisone injections and viscosupplementation.  Risks and benefits of surgery have been discussed, questions answered.   DESCRIPTION OF PROCEDURE: The patient identified by armband, received  right femoral nerve block and IV antibiotics, in the holding area at [facility]. Patient taken to the operating room, appropriate anesthetic  monitors were attached General endotracheal anesthesia induced with  the patient in supine position, Foley catheter was inserted. Tourniquet  applied high to the operative thigh. Lateral post and foot positioner  applied to the table, the lower extremity was then prepped and draped  in usual sterile fashion from  the ankle to the tourniquet. Time-out procedure was performed. The limb was wrapped with an Esmarch bandage and the tourniquet inflated to 350 mmHg. We began the operation by making the anterior midline incision starting at  handbreadth above the patella going over the patella 1 cm medial to and  4 cm distal to the tibial tubercle. Small bleeders in the skin and the  subcutaneous tissue identified and cauterized. Transverse retinaculum was incised and reflected medially and a medial parapatellar arthrotomy was accomplished. the patella was everted and theprepatellar fat pad resected. The superficial medial collateral  ligament was then elevated from anterior to posterior along the proximal  flare of the tibia and anterior half of the menisci resected. The knee was hyperflexed exposing bone on bone arthritis. Peripheral and notch osteophytes as well as the cruciate ligaments were then resected. We continued to  work our way  around posteriorly along the proximal tibia, and externally  rotated the tibia subluxing it out from underneath the femur. A McHale  retractor was placed through the notch and a lateral Hohmann retractor  placed, and we then drilled through the proximal tibia in line with the  axis of the tibia followed by an intramedullary guide rod and 2-degree  posterior slope cutting guide. The tibial cutting guide was pinned into place  allowing resection of 4 mm of bone medially and about 9 mm of bone  laterally because of her varus deformity. Satisfied with the tibial resection, we then  entered the distal femur 2 mm anterior to the PCL origin with the  intramedullary guide rod and applied the distal femoral cutting guide  set at 11mm, with 5 degrees of valgus. This was pinned along the  epicondylar axis. At this point, the distal femoral cut was accomplished without difficulty. We then sized for a #5 femoral component and pinned the guide in 3 degrees of external rotation.The chamfer  cutting guide was pinned into place. The anterior, posterior, and chamfer cuts were accomplished without difficulty followed by  the Sigma RP box cutting guide and the box cut. We also removed posterior osteophytes from the posterior femoral condyles. At this  time, the knee was brought into full extension. We checked our  extension and flexion gaps and found them symmetric at 10mm.  The patella thickness measured at 26 mm. We felt a 41 patella would  fit. We set the cutting guide at 15 and removed the posterior 9.5-10 mm  of the patella sized for 41 button and drilled the lollipop. The knee  was then once again hyperflexed exposing the proximal tibia. We sized for a #5 tibial base plate, applied the smokestack and the conical reamer followed by the the Delta fin keel punch. We then hammered into place the Sigma RP trial femoral component, inserted a 10-mm trial bearing, trial patellar button, and took the knee through range of motion from 0-130 degrees. No thumb pressure was required for patellar  tracking. At this point, all trial components were removed, a double batch of DePuy HV cement with 1500 mg of Zinacef was mixed and applied to all bony metallic mating surfaces except for the posterior condyles of the femur itself. In order, we  hammered into place the tibial tray and removed excess cement, the femoral component and removed excess cement, a 10-mm Sigma RP bearing  was inserted, and the knee brought to full extension with compression.  The patellar button was clamped into place, and excess cement  removed. While the cement cured the wound was irrigated out with normal saline solution pulse lavage, and medium Hemovac drains were placed from an anterolateral  approach. Ligament stability and patellar tracking were checked and found to be excellent. The parapatellar arthrotomy was closed with  running #1 Vicryl suture. The subcutaneous tissue with 0 and 2-0 undyed  Vicryl suture, and the skin  with skin staples. A dressing of Xeroform,  4 x 4, dressing sponges, Webril, and Ace wrap applied. The patient  awakened, extubated, and taken to recovery room without difficulty.   Camdyn Beske J 04/16/2011, 11:03 AM

## 2011-04-16 NOTE — Anesthesia Postprocedure Evaluation (Signed)
Anesthesia Post Note  Patient: Jared [name]  Procedure(s) Performed:  TOTAL KNEE ARTHROPLASTY - LEFT TOTAL KNEE ARTHROPLASTY   Anesthesia type: General  Patient location: PACU  Post pain: Pain level controlled  Post assessment: Patient's Cardiovascular Status Stable  Last Vitals:  Filed Vitals:   04/16/11 1300  BP: 150/72  Pulse: 78  Temp: 36.8 C  Resp: 15    Post vital signs: Reviewed and stable  Level of consciousness: sedated  Complications: No apparent anesthesia complications

## 2011-04-16 NOTE — Interval H&P Note (Signed)
History and Physical Interval Note:  04/16/2011 9:01 AM  Jared [name]  has presented today for surgery, with the diagnosis of OSTEOARTHRITIS LEFT KNEE  The various methods of treatment have been discussed with the patient and family. After consideration of risks, benefits and other options for treatment, the patient has consented to  Procedure(s): TOTAL KNEE ARTHROPLASTY as a surgical intervention .  The patients' history has been reviewed, patient examined, no change in status, stable for surgery.  I have reviewed the patients' chart and labs.  Questions were answered to the patient's satisfaction.     Nestor Lewandowsky

## 2011-04-16 NOTE — Anesthesia Preprocedure Evaluation (Signed)
Anesthesia Evaluation  Patient identified by MRN, date of birth, ID band Patient awake    Reviewed: Allergy & Precautions, H&P , NPO status , Patient's Chart, lab work & pertinent test results, reviewed documented beta blocker date and time   Airway Mallampati: I      Dental  (+) Edentulous Upper and Partial Lower   Pulmonary neg pulmonary ROS,    Pulmonary exam normal       Cardiovascular hypertension, Pt. on medications     Neuro/Psych Negative Neurological ROS  Negative Psych ROS   GI/Hepatic negative GI ROS, Neg liver ROS,   Endo/Other  Negative Endocrine ROS  Renal/GU negative Renal ROS  Genitourinary negative   Musculoskeletal   Abdominal   Peds  Hematology negative hematology ROS (+)   Anesthesia Other Findings   Reproductive/Obstetrics                           Anesthesia Physical Anesthesia Plan  ASA: II  Anesthesia Plan: General and Regional   Post-op Pain Management: MAC Combined w/ Regional for Post-op pain   Induction: Intravenous  Airway Management Planned: LMA  Additional Equipment:   Intra-op Plan:   Post-operative Plan: Extubation in OR  Informed Consent: I have reviewed the patients History and Physical, chart, labs and discussed the procedure including the risks, benefits and alternatives for the proposed anesthesia with the patient or authorized representative who has indicated his/her understanding and acceptance.   Dental advisory given  Plan Discussed with:   Anesthesia Plan Comments:         Anesthesia Quick Evaluation

## 2011-04-16 NOTE — Preoperative (Signed)
Beta Blockers   Reason not to administer Beta Blockers:Not Applicable. No home beta blockers 

## 2011-04-16 NOTE — Transfer of Care (Signed)
Immediate Anesthesia Transfer of Care Note  Patient: Jared [name]  Procedure(s) Performed:  TOTAL KNEE ARTHROPLASTY - LEFT TOTAL KNEE ARTHROPLASTY   Patient Location: PACU  Anesthesia Type: GA combined with regional for post-op pain  Level of Consciousness: awake and sedated  Airway & Oxygen Therapy: Patient Spontanous Breathing and Patient connected to nasal cannula oxygen  Post-op Assessment: Report given to PACU RN and Post -op Vital signs reviewed and stable  Post vital signs: stable  Complications: No apparent anesthesia complications

## 2011-04-17 LAB — BASIC METABOLIC PANEL
BUN: 10 mg/dL (ref 6–23)
CO2: 26 mEq/L (ref 19–32)
Chloride: 102 mEq/L (ref 96–112)
Glucose, Bld: 149 mg/dL — ABNORMAL HIGH (ref 70–99)
Potassium: 4.1 mEq/L (ref 3.5–5.1)
Sodium: 135 mEq/L (ref 135–145)

## 2011-04-17 LAB — CBC
HCT: 34.8 % — ABNORMAL LOW (ref 39.0–52.0)
Hemoglobin: 11.8 g/dL — ABNORMAL LOW (ref 13.0–17.0)
RBC: 3.77 MIL/uL — ABNORMAL LOW (ref 4.22–5.81)

## 2011-04-17 LAB — PROTIME-INR: INR: 1.19 (ref 0.00–1.49)

## 2011-04-17 MED ORDER — WARFARIN SODIUM 6 MG PO TABS
6.0000 mg | ORAL_TABLET | Freq: Once | ORAL | Status: AC
  Administered 2011-04-17: 6 mg via ORAL
  Filled 2011-04-17: qty 1

## 2011-04-17 NOTE — Progress Notes (Signed)
ANTICOAGULATION CONSULT NOTE - Follow Up Consult  Pharmacy Consult for Coumadin Indication: VTE prophylaxis  No Known Allergies  Patient Measurements:   Adjusted Body Weight:    Vital Signs: Temp: 98.7 F (37.1 C) (12/04 0707) Temp src: Oral (12/04 0707) BP: 157/79 mmHg (12/04 0707) Pulse Rate: 98  (12/04 0707)  Labs:  Basename 04/17/11 0605 04/16/11 1647  HGB 11.8* --  HCT 34.8* --  PLT 142* --  APTT -- --  LABPROT 15.4* 14.2  INR 1.19 1.08  HEPARINUNFRC -- --  CREATININE 1.19 --  CKTOTAL -- --  CKMB -- --  TROPONINI -- --   CrCl is unknown because there is no height on file for the current visit.   Medications:  Scheduled:    . amLODipine  10 mg Oral Daily  . enoxaparin  30 mg Subcutaneous Q12H  . methocarbamol(ROBAXIN) IV  500 mg Intravenous Once  . multivitamins ther. w/minerals  1 tablet Oral Daily  . patient's guide to using coumadin book   Does not apply Once  . Tamsulosin HCl  0.4 mg Oral Daily  . warfarin  7.5 mg Oral ONCE-1800  . warfarin   Does not apply Once  . DISCONTD: chlorhexidine  60 mL Topical Once   Infusions:    . dextrose 5 % and 0.45 % NaCl with KCl 20 mEq/L 125 mL/hr at 04/17/11 0700  . lactated ringers 50 mL/hr at 04/16/11 0841  . DISCONTD: dextrose 5 % and 0.45% NaCl     PRN: acetaminophen, acetaminophen, alum & mag hydroxide-simeth, diphenhydrAMINE, HYDROcodone-acetaminophen, HYDROmorphone, menthol-cetylpyridinium, methocarbamol(ROBAXIN) IV, methocarbamol, metoCLOPramide (REGLAN) injection, metoCLOPramide, ondansetron (ZOFRAN) IV, ondansetron, oxyCODONE-acetaminophen, phenol, zolpidem, DISCONTD: cefUROXime, DISCONTD: sodium chloride irrigation  Assessment: S/p Left TKA 12/3. INR up to 1.19 today. No bleeding noted. Hgb 11.8 (baseline 14.6 on 04/09/11).  Goal of Therapy:  INR 1.5-2 per Dr.Rowan's note today.   Plan:  Lovenox 30mg /12h. Coumadin 6mg  x 1 today  Merilynn Finland, [employer] 04/17/2011,8:57 AM

## 2011-04-17 NOTE — Progress Notes (Signed)
Physical Therapy Evaluation Patient Details Name: Jared [name] MRN: [id number] DOB: [birth year] Today's Date: 04/17/2011  Problem List:  Patient Active Problem List  Diagnoses  . Arthritis of left knee    Past Medical History:  Past Medical History  Diagnosis Date  . Arthritis     knees and back  . Hypertension     stress test done about 12 yrs ago - results wnl per pt.   Past Surgical History:  Past Surgical History  Procedure Date  . Rotator cuff repair 2009    right shoulder  . Back surgery 2002  . Enlarged prostate     enlarged prostate-takes Flomax    PT Assessment/Plan/Recommendation PT Assessment Clinical Impression Statement: 65 yo male s/p LTKA presents with decr functional mobility and impairments listed below; will benefit from acute PT services to maximize independence and safety with mobility, transfers, amb, steps to enable safe dc home PT Recommendation/Assessment: Patient will need skilled PT in the acute care venue PT Problem List: Decreased strength;Decreased range of motion;Decreased activity tolerance;Decreased mobility;Decreased knowledge of use of DME;Pain;Decreased knowledge of precautions PT Therapy Diagnosis : Difficulty walking;Abnormality of gait;Acute pain PT Plan PT Frequency: 7X/week PT Treatment/Interventions: DME instruction;Gait training;Stair training;Functional mobility training;Therapeutic activities;Therapeutic exercise;Patient/family education PT Recommendation Recommendations for Other Services: OT consult Follow Up Recommendations: Home health PT Equipment Recommended: 3 in 1 bedside comode;Rolling walker with 5" wheels PT Goals  Acute Rehab PT Goals PT Goal Formulation: With patient Time For Goal Achievement: 7 days Pt will go Supine/Side to Sit: with modified independence PT Goal: Supine/Side to Sit - Progress: Progressing toward goal Pt will go Sit to Supine/Side: with supervision PT Goal: Sit to Supine/Side - Progress:  Progressing toward goal Pt will go Sit to Stand: with supervision PT Goal: Sit to Stand - Progress: Progressing toward goal Pt will go Stand to Sit: with supervision PT Goal: Stand to Sit - Progress: Progressing toward goal Pt will Ambulate: >150 feet;with supervision;with rolling walker PT Goal: Ambulate - Progress: Progressing toward goal Pt will Go Up / Down Stairs: 3-5 stairs;with supervision;with rail(s) PT Goal: Up/Down Stairs - Progress: Other (comment) Pt will Perform Home Exercise Program: Independently PT Goal: Perform Home Exercise Program - Progress: Progressing toward goal  PT Evaluation Precautions/Restrictions  Precautions Precautions: Knee Restrictions Weight Bearing Restrictions: Yes LLE Weight Bearing: Weight bearing as tolerated Prior Functioning  Home Living Lives With: Spouse Receives Help From: Family Type of Home: House Home Layout: Two level;Able to live on main level with bedroom/bathroom Home Access: Stairs to enter Entrance Stairs-Rails: Right;Left Entrance Stairs-Number of Steps: 4 Home Adaptive Equipment: None Prior Function Level of Independence: Independent with homemaking with ambulation Driving: Yes Vocation: Full time employment 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 Cognition Arousal/Alertness: Awake/alert Overall Cognitive Status: Appears within functional limits for tasks assessed Orientation Level: Oriented X4 Sensation/Coordination Sensation Light Touch: Appears Intact Coordination Gross Motor Movements are Fluid and Coordinated: Yes Fine Motor Movements are Fluid and Coordinated: Yes Extremity Assessment RUE Assessment RUE Assessment: Within Functional Limits LUE Assessment LUE Assessment: Within Functional Limits RLE Assessment RLE Assessment: Within Functional Limits LLE Assessment LLE Assessment: Exceptions to [facility] LLE Strength LLE Overall Strength Comments: Decr AROM and strength,  limited by pain postop; postive quad activation, though weak; range approx 5 deg to 75degrees by visual approximation Mobility (including Balance) Bed Mobility Bed Mobility: Yes Supine to Sit: 4: Min assist;With rails Supine to Sit Details (indicate cue type and reason): step-by-step cues for technique, min  physical assist for LLE Transfers Transfers: Yes Sit to Stand: 4: Min assist;From bed;With upper extremity assist Sit to Stand Details (indicate cue type and reason): cues for safe technique, safe hand placement Stand to Sit: 4: Min assist;To chair/3-in-1;With armrests Stand to Sit Details: cues for optimal positioning, safe technique and hand placement Ambulation/Gait Ambulation/Gait: Yes Ambulation/Gait Assistance: 1: +2 Total assist;Patient percentage (comment) (pt=85%, decreasing to approx 70%) Ambulation/Gait Assistance Details (indicate cue type and reason): requiring more assist as distance incr secondary to dizziness (likely from meds); cues for sequence, and to activate Left quad for stability in stance; required physical block of left knee secondary to buckling; cues to selfmonitor for activity tol Ambulation Distance (Feet): 15 Feet Assistive device: Rolling walker Gait Pattern: Step-to pattern;Antalgic  Posture/Postural Control Posture/Postural Control: No significant limitations Exercise  Total Joint Exercises Ankle Circles/Pumps: AROM;20 reps;Both;Supine;Seated Quad Sets: AROM;Left;20 reps;Seated;Supine Heel Slides: AAROM;Left;5 reps;AROM;10 reps End of Session PT - End of Session Equipment Utilized During Treatment: Gait belt Activity Tolerance: Patient limited by fatigue Patient left: in chair;with call bell in reach;with family/visitor present Nurse Communication: Mobility status for transfers;Mobility status for ambulation General Behavior During Session: [facility] for tasks performed Cognition: [facility] for tasks performed  Van Clines [facility] [place], [place]  [phone]  04/17/2011, 11:56 AM

## 2011-04-17 NOTE — Progress Notes (Signed)
Patient ID: Jared [name], male   DOB: [birth year], 65 y.o.   MRN: [id number] PATIENT ID: Jared [name]  MRN: [id number]  DOB/AGE:  December 21, 1945 / 65 y.o.  1 Day Post-Op Procedure(s) (LRB): TOTAL KNEE ARTHROPLASTY (Left)    PROGRESS NOTE Subjective: Patient is alert, oriented, no Nausea, no Vomiting, yes passing gas, no Bowel Movement. Taking PO liquids. Denies SOB, Chest or Calf Pain. Using Incentive Spirometer, PAS in place. Ambulate today WBAT, CPM 0-30 x 5hr Patient reports pain as 7 on 0-10 scale  .    Objective: Vital signs in last 24 hours: Filed Vitals:   04/16/11 1522 04/16/11 1553 04/16/11 2208 04/17/11 0707  BP:  153/70 131/77 157/79  Pulse: 71 73 70 98  Temp:  98.3 F (36.8 C) 98 F (36.7 C) 98.7 F (37.1 C)  TempSrc:  Oral Oral Oral  Resp: 21 20 24 20   SpO2: 98% 100% 98% 99%      Intake/Output from previous day: I/O last 3 completed shifts: In: 4129.2 [P.O.:250; I.V.:3379.2; IV Piggyback:500] Out: 3270 [Urine:2250; Drains:1000; Blood:20]   Intake/Output this shift: Total I/O In: -  Out: 750 [Urine:650; Drains:100]   LABORATORY DATA:  Basename 04/17/11 0605 04/16/11 1647  WBC 7.2 --  HGB 11.8* --  HCT 34.8* --  PLT 142* --  NA -- --  K -- --  CL -- --  CO2 -- --  BUN -- --  CREATININE -- --  GLUCOSE -- --  GLUCAP -- --  INR 1.19 1.08  CALCIUM -- --    Examination: Neurologically intact ABD soft Neurovascular intact Sensation intact distally Intact pulses distally Dorsiflexion/Plantar flexion intact Incision: dressing C/D/I No cellulitis present Compartment soft hemovac DC'd}  Assessment:   1 Day Post-Op Procedure(s) (LRB): TOTAL KNEE ARTHROPLASTY (Left) ADDITIONAL DIAGNOSIS:  Hypertension  Plan: PT/OT WBAT, CPM 5/hrs day until ROM 0-90 degrees, then D/C CPM DVT Prophylaxis:  Lovenox\Coumadin bridge target INR 1.5-2.0 DISCHARGE PLAN: Home DISCHARGE NEEDS: HHPT, HHRN, CPM, Walker and 3-in-1 comode seat     Thuan Tippett J 04/17/2011,  7:50 AM

## 2011-04-18 ENCOUNTER — Encounter (HOSPITAL_COMMUNITY): Payer: Self-pay | Admitting: Orthopedic Surgery

## 2011-04-18 LAB — CBC
HCT: 29.7 % — ABNORMAL LOW (ref 39.0–52.0)
Hemoglobin: 10.2 g/dL — ABNORMAL LOW (ref 13.0–17.0)
RBC: 3.26 MIL/uL — ABNORMAL LOW (ref 4.22–5.81)
RDW: 12.2 % (ref 11.5–15.5)
WBC: 9.1 10*3/uL (ref 4.0–10.5)

## 2011-04-18 LAB — PROTIME-INR: INR: 1.42 (ref 0.00–1.49)

## 2011-04-18 MED ORDER — WARFARIN SODIUM 5 MG PO TABS: 5.0000 mg | ORAL_TABLET | Freq: Every day | ORAL | Status: DC

## 2011-04-18 MED ORDER — OXYCODONE-ACETAMINOPHEN 5-325 MG PO TABS: 1.0000 | ORAL_TABLET | ORAL | Status: AC | PRN

## 2011-04-18 MED ORDER — METHOCARBAMOL 500 MG PO TABS: 500.0000 mg | ORAL_TABLET | Freq: Four times a day (QID) | ORAL | Status: AC | PRN

## 2011-04-18 MED ORDER — WARFARIN SODIUM 6 MG PO TABS
6.0000 mg | ORAL_TABLET | Freq: Once | ORAL | Status: DC
  Filled 2011-04-18: qty 1

## 2011-04-18 NOTE — Discharge Summary (Signed)
Patient ID: Jared [name] MRN: [id number] DOB/AGE: 09/18/1945 65 y.o.  Admit date: 04/16/2011 Discharge date: 04/18/2011  Admission Diagnoses:  Principal Problem:  *Arthritis of left knee   Discharge Diagnoses:  Same  Past Medical History  Diagnosis Date  . Arthritis     knees and back  . Hypertension     stress test done about 12 yrs ago - results wnl per pt.    Surgeries: Procedure(s): TOTAL KNEE ARTHROPLASTY on 04/16/2011   Consultants:    Discharged Condition: Improved  Hospital Course: ISHAAQ PENNA is an 65 y.o. male who was admitted 04/16/2011 for operative treatment ofArthritis of left knee. Patient has severe unremitting pain that affects sleep, daily activities, and work/hobbies. After pre-op clearance the patient was taken to the operating room on 04/16/2011 and underwent  Procedure(s): TOTAL KNEE ARTHROPLASTY.    Patient was given perioperative antibiotics: Anti-infectives     Start     Dose/Rate Route Frequency Ordered Stop   04/16/11 1001   cefUROXime (ZINACEF) injection  Status:  Discontinued          As needed 04/16/11 1002 04/16/11 1120           Patient was given sequential compression devices, early ambulation, and chemoprophylaxis to prevent DVT.  Patient benefited maximally from hospital stay and there were no complications.    Recent vital signs: Patient Vitals for the past 24 hrs:  BP Temp Temp src Pulse Resp SpO2  04/18/11 0625 137/62 mmHg 98.1 F (36.7 C) Oral 77  18  100 %  04/18/11 0230 - 99.8 F (37.7 C) Oral - - -  04/18/11 0119 - 100.5 F (38.1 C) Oral - - -  May 02, 2011 2327 - 102.2 F (39 C) Oral - - -  05-02-11 2005 158/86 mmHg 100.3 F (37.9 C) Oral 86  18  98 %     Recent laboratory studies:  Basename 04/18/11 0545 2011/05/02 0605  WBC 9.1 7.2  HGB 10.2* 11.8*  HCT 29.7* 34.8*  PLT 134* 142*  NA -- 135  K -- 4.1  CL -- 102  CO2 -- 26  BUN -- 10  CREATININE -- 1.19  GLUCOSE -- 149*  INR 1.42 1.19  CALCIUM -- 8.0*      Discharge Medications:  Current Discharge Medication List    START taking these medications   Details  methocarbamol (ROBAXIN) 500 MG tablet Take 1 tablet (500 mg total) by mouth every 6 (six) hours as needed. Qty: 60 tablet, Refills: 0    oxyCODONE-acetaminophen (PERCOCET) 5-325 MG per tablet Take 1-2 tablets by mouth every 4 (four) hours as needed for pain. Qty: 60 tablet, Refills: 0    warfarin (COUMADIN) 5 MG tablet Take 1 tablet (5 mg total) by mouth daily. Qty: 30 tablet, Refills: 0      CONTINUE these medications which have NOT CHANGED   Details  amLODipine (NORVASC) 10 MG tablet Take 10 mg by mouth daily.     Multiple Vitamins-Minerals (MULTIVITAMINS THER. W/MINERALS) TABS Take 1 tablet by mouth daily.      Tamsulosin HCl (FLOMAX) 0.4 MG CAPS Take 0.4 mg by mouth daily.        STOP taking these medications     aspirin 81 MG tablet      Naproxen Sodium (ALEVE) 220 MG CAPS         Diagnostic Studies: Dg Chest 2 View  04/09/2011  *RADIOLOGY REPORT*  Clinical Data: Preoperative cardiopulmonary evaluation. Hypertension.  CHEST - 2  VIEW  Comparison: 03/03/2010.  Findings: There is slight enlargement cardiac silhouette which is larger than on the previous study.  Mediastinal and hilar contours appear stable.  There is overall mild hyperinflation configuration. No pulmonary edema, pneumonia, or pleural effusion is seen.  There is osteophyte formation in the spine.  IMPRESSION: Slight enlargement of the cardiac silhouette which is larger than on prior study.  Overall mild hyperinflation configuration. No pneumonia, pulmonary edema, or pleural effusion is evident.  Original Report Authenticated By: Crawford Givens, M.D.    Disposition:   Discharge Orders    Future Orders Please Complete By Expires   Increase activity slowly      Walker       May shower / Bathe      Driving Restrictions      Comments:   No driving for 2 weeks.   Change dressing (specify)      Comments:    Dressing change as needed.   Call MD for:  temperature >100.4      Call MD for:  severe uncontrolled pain      Call MD for:  redness, tenderness, or signs of infection (pain, swelling, redness, odor or Guzy/yellow discharge around incision site)      Discharge instructions      Comments:   F/U in 12-14 days         Signed: Shirl Harris M. 04/18/2011, 8:03 AM

## 2011-04-18 NOTE — Progress Notes (Signed)
ANTICOAGULATION CONSULT NOTE - Follow Up Consult  Pharmacy Consult for Coumadin Indication: VTE prophylaxis  No Known Allergies  Patient Measurements:   Adjusted Body Weight:    Vital Signs: Temp: 98.1 F (36.7 C) (12/05 0625) Temp src: Oral (12/05 0625) BP: 137/62 mmHg (12/05 0625) Pulse Rate: 77  (12/05 0625)  Labs:  Basename 04/18/11 0545 04/17/11 0605 04/16/11 1647  HGB 10.2* 11.8* --  HCT 29.7* 34.8* --  PLT 134* 142* --  APTT -- -- --  LABPROT 17.6* 15.4* 14.2  INR 1.42 1.19 1.08  HEPARINUNFRC -- -- --  CREATININE -- 1.19 --  CKTOTAL -- -- --  CKMB -- -- --  TROPONINI -- -- --   CrCl is unknown because there is no height on file for the current visit.   Medications:  Scheduled:     . amLODipine  10 mg Oral Daily  . enoxaparin  30 mg Subcutaneous Q12H  . multivitamins ther. w/minerals  1 tablet Oral Daily  . Tamsulosin HCl  0.4 mg Oral Daily  . warfarin  6 mg Oral ONCE-1800  . warfarin   Does not apply Once   Infusions:     . dextrose 5 % and 0.45 % NaCl with KCl 20 mEq/L 125 mL/hr at 04/18/11 0635  . lactated ringers 50 mL/hr at 04/16/11 0841   PRN: acetaminophen, acetaminophen, alum & mag hydroxide-simeth, diphenhydrAMINE, HYDROcodone-acetaminophen, HYDROmorphone, menthol-cetylpyridinium, methocarbamol(ROBAXIN) IV, methocarbamol, metoCLOPramide (REGLAN) injection, metoCLOPramide, ondansetron (ZOFRAN) IV, ondansetron, oxyCODONE-acetaminophen, phenol, zolpidem  Assessment: S/p Left TKA 12/3. INR up to 1.42 today. No bleeding noted. Hgb 10.2 (baseline 14.6 on 04/09/11).  Goal of Therapy:  INR 1.5-2 per Dr.Rowan's note today.   Plan:  Lovenox 30mg /12h. Coumadin 6mg  x 1 today Agree with d/c home on 5mg /d maintenance  Merilynn Finland, [employer] 04/18/2011,9:00 AM

## 2011-04-18 NOTE — Progress Notes (Signed)
Occupational Therapy Evaluation Patient Details Name: Jared [name] MRN: [id number] DOB: 1945/09/11 Today's Date: 04/18/2011 8:31-8:52  ev1 Problem List:  Patient Active Problem List  Diagnoses  . Arthritis of left knee    Past Medical History:  Past Medical History  Diagnosis Date  . Arthritis     knees and back  . Hypertension     stress test done about 12 yrs ago - results wnl per pt.   Past Surgical History:  Past Surgical History  Procedure Date  . Rotator cuff repair 2009    right shoulder  . Back surgery 2002  . Enlarged prostate     enlarged prostate-takes Flomax    OT Assessment/Plan/Recommendation OT Assessment Clinical Impression Statement: Pt doing very well overall supervision for toileting and shower transfers. Still needs min assist for LB selfcare, but wife can assist until LB selfcare improves. Feel he will need a 3:1 for home.  No further acute or post acute OT needs. OT Recommendation/Assessment: Patient does not need any further OT services OT Recommendation Follow Up Recommendations: None Equipment Recommended: 3 in 1 bedside comode Individuals Consulted Consulted and Agree with Results and Recommendations: Family member/caregiver;Patient Family Member Consulted: Wife and nephew         OT Goals    OT Evaluation Precautions/Restrictions  Precautions Precautions: Knee Restrictions Weight Bearing Restrictions: No LLE Weight Bearing: Weight bearing as tolerated Prior Functioning Home Living Lives With: Spouse Receives Help From: Family Type of Home: House Home Layout: Two level;Able to live on main level with bedroom/bathroom Home Access: Stairs to enter Entrance Stairs-Rails: Right;Left Entrance Stairs-Number of Steps: 4 Bathroom Shower/Tub: Walk-in shower;Door [employer] Toilet: Standard Bathroom Accessibility: Yes Home Adaptive Equipment: None Prior Function Level of Independence: Independent with homemaking with ambulation Driving:  Yes Vocation: Full time employment ADL ADL Eating/Feeding: Simulated;Independent Where Assessed - Eating/Feeding: Chair Grooming: Simulated;Modified independent Where Assessed - Grooming: Standing at sink Upper Body Bathing: Simulated;Modified independent Where Assessed - Upper Body Bathing: Sitting, chair Lower Body Bathing: Simulated;Minimal assistance Where Assessed - Lower Body Bathing: Sit to stand from chair Upper Body Dressing: Simulated;Supervision/safety Where Assessed - Upper Body Dressing: Sitting, chair Lower Body Dressing: Minimal assistance Where Assessed - Lower Body Dressing: Sit to stand from chair Toilet Transfer: Performed;Supervision/safety Toilet Transfer Details (indicate cue type and reason): Pt requires min instructional cueing to decrease step length and move walker further out in front of her. Toilet Transfer Method: Proofreader: Raised toilet seat with arms (or 3-in-1 over toilet) Toileting - Clothing Manipulation: Simulated;Supervision/safety Where Assessed - Toileting Clothing Manipulation: Sit to stand from 3-in-1 or toilet Toileting - Hygiene: Simulated;Supervision/safety Where Assessed - Toileting Hygiene: Sit on 3-in-1 or toilet;Sit to stand from 3-in-1 or toilet Tub/Shower Transfer: Simulated;Supervision/safety Tub/Shower Transfer Method: Science writer: Other (comment) (Bedside commode) Equipment Used: Rolling walker ADL Comments: Pt demonstrates decreased ability to reach the left foot for donning sock and shoe. Wife will assist PRN until pt's flexibility improves. Vision/Perception  Vision - History Baseline Vision: No visual deficits Cognition Cognition Arousal/Alertness: Awake/alert Sensation/Coordination Sensation Light Touch: Appears Intact Coordination Gross Motor Movements are Fluid and Coordinated: Yes Fine Motor Movements are Fluid and Coordinated: Yes Extremity Assessment RUE  Assessment RUE Assessment: Within Functional Limits LUE Assessment LUE Assessment: Within Functional Limits Mobility  Bed Mobility Bed Mobility: No Exercises   End of Session OT - End of Session Equipment Utilized During Treatment: Other (comment) (Rolling Walker) Patient left: in chair;with call bell in reach;with family/visitor  present General Behavior During Session: [facility] for tasks performed Cognition: [facility] for tasks performed   [facility] OTR/L Pager number [phone]  04/18/2011, 9:14 AM

## 2011-04-18 NOTE — Progress Notes (Signed)
Physical Therapy Treatment Patient Details Name: Jared [name] MRN: [id number] DOB: Oct 24, 1945 Today's Date: 04/18/2011  PT Assessment/Plan  PT - Assessment/Plan Comments on Treatment Session: Good progress with mobility and steps, should dc home today PT Plan: Discharge plan remains appropriate PT Frequency: 7X/week Follow Up Recommendations: Home health PT Equipment Recommended: 3 in 1 bedside comode PT Goals  Acute Rehab PT Goals Time For Goal Achievement: 7 days Pt will go Supine/Side to Sit: with modified independence PT Goal: Supine/Side to Sit - Progress: Other (comment) PT Goal: Sit to Supine/Side - Progress: Other (comment) PT Goal: Sit to Stand - Progress: Progressing toward goal PT Goal: Stand to Sit - Progress: Progressing toward goal PT Goal: Ambulate - Progress: Progressing toward goal PT Goal: Up/Down Stairs - Progress: Progressing toward goal PT Goal: Perform Home Exercise Program - Progress: Other (comment)  PT Treatment Precautions/Restrictions  Precautions Precautions: Knee Restrictions Weight Bearing Restrictions: No LLE Weight Bearing: Weight bearing as tolerated Mobility (including Balance) Transfers Sit to Stand: 4: Min assist;From chair/3-in-1;With armrests (without physical contact) Sit to Stand Details (indicate cue type and reason): good use of UE assist to puch up; pt also performed sit to and from stand to low mat table without difficulty Stand to Sit: 4: Min assist;With upper extremity assist;To chair/3-in-1 (and to low mat table ]) Stand to Sit Details: cues to control descent Ambulation/Gait Ambulation/Gait: Yes Ambulation/Gait Assistance: 4: Min assist Ambulation/Gait Assistance Details (indicate cue type and reason): close guard and watch of L knee to monitor for buckling; cues to activate quad for l stance stability Ambulation Distance (Feet): 110 Feet Assistive device: Rolling walker Gait Pattern: Step-through pattern (monitor for left  knee buckle) Stairs: Yes Stairs Assistance: 4: Min assist Stairs Assistance Details (indicate cue type and reason): demo cues for technique, tactile cues to keep quad contracted for Left stance stability; Nephew presnt and given cues for correct guard and assist; Nephew correctly assisted pt up steps Stair Management Technique: One rail Left;Step to pattern Number of Stairs: 2  (x2)    Exercise    End of Session PT - End of Session Equipment Utilized During Treatment: Gait belt Activity Tolerance: Patient tolerated treatment well Patient left: in chair;with call bell in reach;with family/visitor present General Behavior During Session: [facility] for tasks performed Cognition: [facility] for tasks performed  Van Clines [facility] [place], [place] [phone]  04/18/2011, 1:20 PM

## 2011-04-18 NOTE — Progress Notes (Signed)
Discharge planning. Patient preoperatively setup with [facility]. No changes. CPM and 3in1 to be delivered by TNT to his home.

## 2011-04-18 NOTE — Progress Notes (Signed)
PATIENT ID: Jared [name]  MRN: [id number]  DOB/AGE:  1945/09/10 / 65 y.o.  2 Days Post-Op Procedure(s) (LRB): TOTAL KNEE ARTHROPLASTY (Left)    PROGRESS NOTE Subjective: Patient is alert, oriented, no Nausea, no Vomiting, yes passing gas, no Bowel Movement. Taking PO well. Denies SOB, Chest or Calf Pain. Using Incentive Spirometer, PAS in place. Ambulating well with PT Patient reports pain as mild  .    Objective: Vital signs in last 24 hours: Filed Vitals:   04/17/11 2327 04/18/11 0119 04/18/11 0230 04/18/11 0625  BP:    137/62  Pulse:    77  Temp: 102.2 F (39 C) 100.5 F (38.1 C) 99.8 F (37.7 C) 98.1 F (36.7 C)  TempSrc: Oral Oral Oral Oral  Resp:    18  SpO2:    100%      Intake/Output from previous day: I/O last 3 completed shifts: In: 2980 [P.O.:480; I.V.:2500] Out: 2350 [Urine:1850; Drains:500]   Intake/Output this shift:     LABORATORY DATA:  Basename 04/18/11 0545 04/17/11 0605  WBC 9.1 7.2  HGB 10.2* 11.8*  HCT 29.7* 34.8*  PLT 134* 142*  NA -- 135  K -- 4.1  CL -- 102  CO2 -- 26  BUN -- 10  CREATININE -- 1.19  GLUCOSE -- 149*  GLUCAP -- --  INR 1.42 1.19  CALCIUM -- 8.0*    Examination: Neurologically intact ABD soft Neurovascular intact Sensation intact distally Incision: dressing C/D/I}  Assessment:   2 Days Post-Op Procedure(s) (LRB): TOTAL KNEE ARTHROPLASTY (Left) ADDITIONAL DIAGNOSIS:  none  Plan: PT/OT WBAT, CPM 5/hrs day until ROM 0-90 degrees, then D/C CPM DVT Prophylaxis:  Lovenox\Coumadin bridge target INR 1.5-2.0 DISCHARGE PLAN: Home DISCHARGE NEEDS: HHPT, HHRN, CPM, Walker and 3-in-1 comode seat     Jared [name] M. 04/18/2011, 7:56 AM

## 2011-04-18 NOTE — Progress Notes (Addendum)
Pt is alert and oriented x 3. Pt significant other at bedside and supportive. Pt has decreased ROM of LLE related to L TKR. Knee precautions. +cms. WBAT with RW and one assist. Pt dressing to L knee is dry and intact. CPM per order. Pt lungs CTA. Heart rate regular. No s/sx cardiac distress or resp distress and no c/o such. Vital signs stable. Pt repts voiding without difficulty. Pt denies cough. Pt repts passing gas and LBM 12/3. Pt denies nausea or vomiting. Pt tolerates diet fair to good. Pt is progressing with PT/OT. Pt has no pressure skin issues. Pt can turn self and floats heels. Pt has slight edema of LLE related to surgery. Pt performs IS per order.

## 2011-08-27 DIAGNOSIS — I10 Essential (primary) hypertension: Secondary | ICD-10-CM | POA: Insufficient documentation

## 2013-02-17 ENCOUNTER — Other Ambulatory Visit: Payer: Self-pay | Admitting: Gastroenterology

## 2014-03-19 ENCOUNTER — Other Ambulatory Visit: Payer: Self-pay | Admitting: Orthopedic Surgery

## 2014-04-13 ENCOUNTER — Encounter (HOSPITAL_COMMUNITY): Payer: Self-pay

## 2014-04-13 ENCOUNTER — Ambulatory Visit (HOSPITAL_COMMUNITY)
Admission: RE | Admit: 2014-04-13 | Discharge: 2014-04-13 | Disposition: A | Discharge status: Home / Self Care | Payer: Medicare Other | Source: Ambulatory Visit | Attending: Orthopedic Surgery | Admitting: Orthopedic Surgery

## 2014-04-13 ENCOUNTER — Encounter (HOSPITAL_COMMUNITY)
Admission: RE | Admit: 2014-04-13 | Discharge: 2014-04-13 | Disposition: A | Discharge status: Home / Self Care | Payer: Medicare Other | Source: Ambulatory Visit | Attending: Orthopedic Surgery | Admitting: Orthopedic Surgery

## 2014-04-13 DIAGNOSIS — Z01818 Encounter for other preprocedural examination: Secondary | ICD-10-CM | POA: Insufficient documentation

## 2014-04-13 DIAGNOSIS — M179 Osteoarthritis of knee, unspecified: Secondary | ICD-10-CM | POA: Insufficient documentation

## 2014-04-13 LAB — PROTIME-INR
INR: 0.99 (ref 0.00–1.49)
Prothrombin Time: 13.2 seconds (ref 11.6–15.2)

## 2014-04-13 LAB — CBC WITH DIFFERENTIAL/PLATELET
BASOS ABS: 0 10*3/uL (ref 0.0–0.1)
BASOS PCT: 0 % (ref 0–1)
EOS PCT: 4 % (ref 0–5)
Eosinophils Absolute: 0.2 10*3/uL (ref 0.0–0.7)
HEMATOCRIT: 41 % (ref 39.0–52.0)
HEMOGLOBIN: 14.2 g/dL (ref 13.0–17.0)
Lymphocytes Relative: 60 % — ABNORMAL HIGH (ref 12–46)
Lymphs Abs: 3 10*3/uL (ref 0.7–4.0)
MCH: 31.3 pg (ref 26.0–34.0)
MCHC: 34.6 g/dL (ref 30.0–36.0)
MCV: 90.3 fL (ref 78.0–100.0)
MONO ABS: 0.3 10*3/uL (ref 0.1–1.0)
MONOS PCT: 6 % (ref 3–12)
NEUTROS ABS: 1.5 10*3/uL — AB (ref 1.7–7.7)
Neutrophils Relative %: 30 % — ABNORMAL LOW (ref 43–77)
Platelets: 171 10*3/uL (ref 150–400)
RBC: 4.54 MIL/uL (ref 4.22–5.81)
RDW: 12.6 % (ref 11.5–15.5)
WBC: 5 10*3/uL (ref 4.0–10.5)

## 2014-04-13 LAB — SURGICAL PCR SCREEN
MRSA, PCR: NEGATIVE
Staphylococcus aureus: NEGATIVE

## 2014-04-13 LAB — BASIC METABOLIC PANEL
Anion gap: 12 (ref 5–15)
BUN: 13 mg/dL (ref 6–23)
CALCIUM: 9.2 mg/dL (ref 8.4–10.5)
CHLORIDE: 101 meq/L (ref 96–112)
CO2: 26 meq/L (ref 19–32)
CREATININE: 1.23 mg/dL (ref 0.50–1.35)
GFR calc Af Amer: 67 mL/min — ABNORMAL LOW (ref >90)
GFR calc non Af Amer: 58 mL/min — ABNORMAL LOW (ref >90)
GLUCOSE: 94 mg/dL (ref 70–99)
Potassium: 4.2 mEq/L (ref 3.7–5.3)
Sodium: 139 mEq/L (ref 137–147)

## 2014-04-13 LAB — TYPE AND SCREEN
ABO/RH(D): O POS
ANTIBODY SCREEN: NEGATIVE

## 2014-04-13 LAB — APTT: aPTT: 32 seconds (ref 24–37)

## 2014-04-14 LAB — URINE CULTURE
CULTURE: NO GROWTH
Colony Count: NO GROWTH

## 2014-04-14 NOTE — Progress Notes (Signed)
Anesthesia Chart Review:  Patient is a 68 year old male scheduled for right TKA on 04/19/14 by Dr. Turner Danielsowan.    History includes former smoker, HTN, arthritis, back surgery, left TKA '12.  PCP is Dr. Catha GosselinKevin Little, last seen for CPE 03/17/14.  EKG on 03/17/14 (Dr. Clarene DukeLittle): SR, PAC's, anterolateral ST elevation/repolarization variant. He had ST elevation/early repolarization on EKG from 04/09/11 as well.  Dr. Clarene DukeLittle already reviewed his most recent EKG and felt patient could be given medical clearance for surgery in December.  CXR 04/13/14: Stable cardiomegaly. No acute cardiopulmonary disease.  Preoperative labs noted. Urine culture showed no growth.  If no acute changes then I would anticipate that he could proceed as planned.  Velna Ochsllison Zelenak, PA-C [facility]/Anesthesiology Phone [phone] 04/14/2014 3:25 PM

## 2014-04-15 NOTE — H&P (Signed)
TOTAL KNEE ADMISSION H&P  Patient is being admitted for right total knee arthroplasty.  Subjective:  Chief Complaint:right knee pain.  HPI: Jared [name], 68 y.o. male, has a history of pain and functional disability in the right knee due to arthritis and has failed non-surgical conservative treatments for greater than 12 weeks to includeNSAID's and/or analgesics, corticosteriod injections, use of assistive devices and activity modification.  Onset of symptoms was gradual, starting 2 years ago with gradually worsening course since that time. The patient noted no past surgery on the right knee(s).  Patient currently rates pain in the right knee(s) at 10 out of 10 with activity. Patient has night pain, worsening of pain with activity and weight bearing, pain that interferes with activities of daily living and pain with passive range of motion.  Patient has evidence of periarticular osteophytes and joint space narrowing by imaging studies. . There is no active infection.  Patient Active Problem List   Diagnosis Date Noted  . Arthritis of left knee 04/16/2011   Past Medical History  Diagnosis Date  . Arthritis     knees and back  . Hypertension     stress test done about 12 yrs ago - results wnl per pt.    Past Surgical History  Procedure Laterality Date  . Rotator cuff repair  2009    right shoulder  . Back surgery  2002  . Enlarged prostate      enlarged prostate-takes Flomax  . Total knee arthroplasty  04/16/2011    Procedure: TOTAL KNEE ARTHROPLASTY;  Surgeon: Nestor LewandowskyFrank J Rowan;  Location: [facility] OR;  Service: Orthopedics;  Laterality: Left;  LEFT TOTAL KNEE ARTHROPLASTY     No prescriptions prior to admission   No Known Allergies  History  Substance Use Topics  . Smoking status: Former Smoker -- 1.00 packs/day for 10 years    Types: Cigarettes    Quit date: 09/07/1974  . Smokeless tobacco: Never Used  . Alcohol Use: No     Comment: quit 1976 - beer    No family history on file.    Review of Systems  Constitutional: Negative.   HENT: Negative.   Eyes: Positive for blurred vision.       Glasses  Respiratory: Negative.   Cardiovascular: Negative.   Gastrointestinal: Negative.   Genitourinary: Negative.   Musculoskeletal: Positive for joint pain.  Skin: Negative.   Neurological: Negative.   Endo/Heme/Allergies: Negative.   Psychiatric/Behavioral: Negative.     Objective:  Physical Exam  Constitutional: He is oriented to person, place, and time. He appears well-developed and well-nourished.  HENT:  Head: Normocephalic and atraumatic.  Eyes: Pupils are equal, round, and reactive to light.  Neck: Normal range of motion. Neck supple.  Cardiovascular: Intact distal pulses.   Respiratory: Effort normal.  Musculoskeletal: He exhibits tenderness.  the patient has good strength and a range from 5 to 125.  Patient does have mild joint line tenderness.  No noticeable effusion.  He does have obvious crepitance with range of motion.  Neurological: He is alert and oriented to person, place, and time.  Skin: Skin is warm and dry.  Psychiatric: He has a normal mood and affect. His behavior is normal. Judgment and thought content normal.    Vital signs in last 24 hours:    Labs:   Estimated body mass index is 28.79 kg/(m^2) as calculated from the following:   Height as of 04/09/11: 5\' 11"  (1.803 m).   Weight as of 04/09/11: 93.6  kg (206 lb 5.6 oz).   Imaging Review Plain radiographs demonstrate severe arthritis of the medial compartment of the right knee.  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  Assessment/Plan:  End stage arthritis, right knee   The patient history, physical examination, clinical judgment of the provider and imaging studies are consistent with end stage degenerative joint disease of the right knee(s) and total knee arthroplasty is deemed medically necessary. The treatment options including medical management, injection  therapy arthroscopy and arthroplasty were discussed at length. The risks and benefits of total knee arthroplasty were presented and reviewed. The risks due to aseptic loosening, infection, stiffness, patella tracking problems, thromboembolic complications and other imponderables were discussed. The patient acknowledged the explanation, agreed to proceed with the plan and consent was signed. Patient is being admitted for inpatient treatment for surgery, pain control, PT, OT, prophylactic antibiotics, VTE prophylaxis, progressive ambulation and ADL's and discharge planning. The patient is planning to be discharged home with home health services

## 2014-04-18 MED ORDER — CEFAZOLIN SODIUM-DEXTROSE 2-3 GM-% IV SOLR
2.0000 g | INTRAVENOUS | Status: AC
  Administered 2014-04-19: 2 g via INTRAVENOUS
  Filled 2014-04-18: qty 50

## 2014-04-18 NOTE — Anesthesia Preprocedure Evaluation (Addendum)
Anesthesia Evaluation  Patient identified by MRN, date of birth, ID band Patient awake    Reviewed: Allergy & Precautions, H&P , NPO status , Patient's Chart, lab work & pertinent test results  Airway Mallampati: II  TM Distance: >3 FB Neck ROM: Full    Dental  (+) Edentulous Upper, Edentulous Lower, Dental Advisory Given   Pulmonary former smoker (quit 1976),  breath sounds clear to auscultation        Cardiovascular hypertension, Pt. on medications Rhythm:Regular  EKG 2012 sinus arrythmia.  CXR mild cardiomegaly   Neuro/Psych    GI/Hepatic   Endo/Other    Renal/GU GFR 67     Musculoskeletal   Abdominal (+)  Abdomen: soft.    Peds  Hematology   Anesthesia Other Findings   Reproductive/Obstetrics                           Anesthesia Physical Anesthesia Plan  ASA: II  Anesthesia Plan: General   Post-op Pain Management: MAC Combined w/ Regional for Post-op pain   Induction: Intravenous  Airway Management Planned: LMA and Oral ETT  Additional Equipment:   Intra-op Plan:   Post-operative Plan: Extubation in OR  Informed Consent: I have reviewed the patients History and Physical, chart, labs and discussed the procedure including the risks, benefits and alternatives for the proposed anesthesia with the patient or authorized representative who has indicated his/her understanding and acceptance.   Dental advisory given  Plan Discussed with:   Anesthesia Plan Comments:        Anesthesia Quick Evaluation

## 2014-04-19 ENCOUNTER — Inpatient Hospital Stay (HOSPITAL_COMMUNITY)
Admission: RE | Admit: 2014-04-19 | Discharge: 2014-04-21 | DRG: 470 | Disposition: A | Discharge status: Home / Self Care | Payer: Medicare Other | Source: Ambulatory Visit | Attending: Orthopedic Surgery | Admitting: Orthopedic Surgery

## 2014-04-19 ENCOUNTER — Encounter (HOSPITAL_COMMUNITY): Payer: Self-pay

## 2014-04-19 ENCOUNTER — Inpatient Hospital Stay (HOSPITAL_COMMUNITY): Payer: Medicare Other | Admitting: Anesthesiology

## 2014-04-19 ENCOUNTER — Inpatient Hospital Stay (HOSPITAL_COMMUNITY): Payer: Medicare Other | Admitting: Vascular Surgery

## 2014-04-19 ENCOUNTER — Encounter (HOSPITAL_COMMUNITY)
Admission: RE | Disposition: A | Discharge status: Home / Self Care | Payer: Self-pay | Source: Ambulatory Visit | Attending: Orthopedic Surgery

## 2014-04-19 DIAGNOSIS — M25561 Pain in right knee: Secondary | ICD-10-CM | POA: Diagnosis present

## 2014-04-19 DIAGNOSIS — D62 Acute posthemorrhagic anemia: Secondary | ICD-10-CM | POA: Diagnosis not present

## 2014-04-19 DIAGNOSIS — I10 Essential (primary) hypertension: Secondary | ICD-10-CM | POA: Diagnosis present

## 2014-04-19 DIAGNOSIS — M1711 Unilateral primary osteoarthritis, right knee: Principal | ICD-10-CM | POA: Diagnosis present

## 2014-04-19 DIAGNOSIS — M1710 Unilateral primary osteoarthritis, unspecified knee: Secondary | ICD-10-CM | POA: Diagnosis present

## 2014-04-19 HISTORY — PX: TOTAL KNEE ARTHROPLASTY: SHX125

## 2014-04-19 SURGERY — ARTHROPLASTY, KNEE, TOTAL
Anesthesia: General | Laterality: Right

## 2014-04-19 MED ORDER — HYDROMORPHONE HCL 1 MG/ML IJ SOLN
1.0000 mg | INTRAMUSCULAR | Status: DC | PRN
  Administered 2014-04-19 – 2014-04-20 (×3): 1 mg via INTRAVENOUS
  Filled 2014-04-19 (×3): qty 1

## 2014-04-19 MED ORDER — OXYCODONE-ACETAMINOPHEN 5-325 MG PO TABS: 1.0000 | ORAL_TABLET | ORAL | Status: DC | PRN

## 2014-04-19 MED ORDER — CEFUROXIME SODIUM 1.5 G IJ SOLR
INTRAMUSCULAR | Status: AC
  Filled 2014-04-19: qty 1.5

## 2014-04-19 MED ORDER — FENTANYL CITRATE 0.05 MG/ML IJ SOLN
INTRAMUSCULAR | Status: AC
  Filled 2014-04-19: qty 5

## 2014-04-19 MED ORDER — SODIUM CHLORIDE 0.9 % IR SOLN
Status: DC | PRN
  Administered 2014-04-19: 3000 mL

## 2014-04-19 MED ORDER — MAGNESIUM CITRATE PO SOLN: 1.0000 | Freq: Once | ORAL | Status: AC | PRN

## 2014-04-19 MED ORDER — METHOCARBAMOL 1000 MG/10ML IJ SOLN
500.0000 mg | Freq: Four times a day (QID) | INTRAVENOUS | Status: DC | PRN
  Filled 2014-04-19: qty 5

## 2014-04-19 MED ORDER — SODIUM CHLORIDE 0.9 % IJ SOLN
INTRAMUSCULAR | Status: DC | PRN
  Administered 2014-04-19: 40 mL

## 2014-04-19 MED ORDER — METHOCARBAMOL 500 MG PO TABS
500.0000 mg | ORAL_TABLET | Freq: Four times a day (QID) | ORAL | Status: DC | PRN
  Administered 2014-04-20 – 2014-04-21 (×3): 500 mg via ORAL
  Filled 2014-04-19 (×4): qty 1

## 2014-04-19 MED ORDER — LIDOCAINE HCL (CARDIAC) 20 MG/ML IV SOLN
INTRAVENOUS | Status: DC | PRN
  Administered 2014-04-19: 100 mg via INTRAVENOUS

## 2014-04-19 MED ORDER — TRANEXAMIC ACID 100 MG/ML IV SOLN
1000.0000 mg | INTRAVENOUS | Status: AC
  Administered 2014-04-19: 1000 mg via INTRAVENOUS
  Filled 2014-04-19: qty 10

## 2014-04-19 MED ORDER — ACETAMINOPHEN 650 MG RE SUPP: 650.0000 mg | Freq: Four times a day (QID) | RECTAL | Status: DC | PRN

## 2014-04-19 MED ORDER — BISACODYL 5 MG PO TBEC: 5.0000 mg | DELAYED_RELEASE_TABLET | Freq: Every day | ORAL | Status: DC | PRN

## 2014-04-19 MED ORDER — ROCURONIUM BROMIDE 100 MG/10ML IV SOLN
INTRAVENOUS | Status: DC | PRN
  Administered 2014-04-19: 50 mg via INTRAVENOUS

## 2014-04-19 MED ORDER — LIDOCAINE HCL (CARDIAC) 20 MG/ML IV SOLN
INTRAVENOUS | Status: AC
  Filled 2014-04-19: qty 5

## 2014-04-19 MED ORDER — MIDAZOLAM HCL 2 MG/2ML IJ SOLN
INTRAMUSCULAR | Status: AC
  Filled 2014-04-19: qty 2

## 2014-04-19 MED ORDER — METOCLOPRAMIDE HCL 5 MG/ML IJ SOLN: 5.0000 mg | Freq: Three times a day (TID) | INTRAMUSCULAR | Status: DC | PRN

## 2014-04-19 MED ORDER — DEXTROSE-NACL 5-0.45 % IV SOLN: INTRAVENOUS | Status: DC

## 2014-04-19 MED ORDER — ROCURONIUM BROMIDE 50 MG/5ML IV SOLN
INTRAVENOUS | Status: AC
  Filled 2014-04-19: qty 1

## 2014-04-19 MED ORDER — SENNOSIDES-DOCUSATE SODIUM 8.6-50 MG PO TABS: 1.0000 | ORAL_TABLET | Freq: Every evening | ORAL | Status: DC | PRN

## 2014-04-19 MED ORDER — FENTANYL CITRATE 0.05 MG/ML IJ SOLN
INTRAMUSCULAR | Status: DC | PRN
  Administered 2014-04-19: 100 ug via INTRAVENOUS
  Administered 2014-04-19: 50 ug via INTRAVENOUS

## 2014-04-19 MED ORDER — ONDANSETRON HCL 4 MG/2ML IJ SOLN
INTRAMUSCULAR | Status: DC | PRN
  Administered 2014-04-19: 4 mg via INTRAVENOUS

## 2014-04-19 MED ORDER — BUPIVACAINE-EPINEPHRINE (PF) 0.5% -1:200000 IJ SOLN
INTRAMUSCULAR | Status: DC | PRN
  Administered 2014-04-19: 30 mL via PERINEURAL

## 2014-04-19 MED ORDER — ALUM & MAG HYDROXIDE-SIMETH 200-200-20 MG/5ML PO SUSP: 30.0000 mL | ORAL | Status: DC | PRN

## 2014-04-19 MED ORDER — DIPHENHYDRAMINE HCL 12.5 MG/5ML PO ELIX: 12.5000 mg | ORAL_SOLUTION | ORAL | Status: DC | PRN

## 2014-04-19 MED ORDER — BUPIVACAINE LIPOSOME 1.3 % IJ SUSP
20.0000 mL | Freq: Once | INTRAMUSCULAR | Status: DC
  Filled 2014-04-19: qty 20

## 2014-04-19 MED ORDER — OXYCODONE HCL 5 MG PO TABS
5.0000 mg | ORAL_TABLET | ORAL | Status: DC | PRN
  Administered 2014-04-19 – 2014-04-21 (×7): 10 mg via ORAL
  Filled 2014-04-19 (×8): qty 2

## 2014-04-19 MED ORDER — ONDANSETRON HCL 4 MG/2ML IJ SOLN
INTRAMUSCULAR | Status: AC
  Filled 2014-04-19: qty 2

## 2014-04-19 MED ORDER — AMLODIPINE BESYLATE 10 MG PO TABS
10.0000 mg | ORAL_TABLET | Freq: Every day | ORAL | Status: DC
  Administered 2014-04-20 – 2014-04-21 (×2): 10 mg via ORAL
  Filled 2014-04-19 (×2): qty 1

## 2014-04-19 MED ORDER — MEPERIDINE HCL 25 MG/ML IJ SOLN: 6.2500 mg | INTRAMUSCULAR | Status: DC | PRN

## 2014-04-19 MED ORDER — PROMETHAZINE HCL 25 MG/ML IJ SOLN: 6.2500 mg | INTRAMUSCULAR | Status: DC | PRN

## 2014-04-19 MED ORDER — GLYCOPYRROLATE 0.2 MG/ML IJ SOLN
INTRAMUSCULAR | Status: DC | PRN
Start: 2014-04-19 — End: 2014-04-19
  Administered 2014-04-19: 0.6 mg via INTRAVENOUS
  Administered 2014-04-19: 0.2 mg via INTRAVENOUS

## 2014-04-19 MED ORDER — LACTATED RINGERS IV SOLN
INTRAVENOUS | Status: DC | PRN
  Administered 2014-04-19 (×2): via INTRAVENOUS

## 2014-04-19 MED ORDER — ONDANSETRON HCL 4 MG/2ML IJ SOLN: 4.0000 mg | Freq: Four times a day (QID) | INTRAMUSCULAR | Status: DC | PRN

## 2014-04-19 MED ORDER — ASPIRIN EC 325 MG PO TBEC: 325.0000 mg | DELAYED_RELEASE_TABLET | Freq: Two times a day (BID) | ORAL | Status: DC

## 2014-04-19 MED ORDER — DOCUSATE SODIUM 100 MG PO CAPS
100.0000 mg | ORAL_CAPSULE | Freq: Two times a day (BID) | ORAL | Status: DC
  Administered 2014-04-19 – 2014-04-21 (×4): 100 mg via ORAL
  Filled 2014-04-19 (×4): qty 1

## 2014-04-19 MED ORDER — FENTANYL CITRATE 0.05 MG/ML IJ SOLN
25.0000 ug | INTRAMUSCULAR | Status: DC | PRN
  Administered 2014-04-19 (×2): 50 ug via INTRAVENOUS

## 2014-04-19 MED ORDER — MIDAZOLAM HCL 2 MG/2ML IJ SOLN
INTRAMUSCULAR | Status: AC
  Administered 2014-04-19: 2 mg
  Filled 2014-04-19: qty 2

## 2014-04-19 MED ORDER — MENTHOL 3 MG MT LOZG: 1.0000 | LOZENGE | OROMUCOSAL | Status: DC | PRN

## 2014-04-19 MED ORDER — LACTATED RINGERS IV SOLN
INTRAVENOUS | Status: DC
  Administered 2014-04-19: 09:00:00 via INTRAVENOUS

## 2014-04-19 MED ORDER — CEFUROXIME SODIUM 1.5 G IJ SOLR
INTRAMUSCULAR | Status: DC | PRN
  Administered 2014-04-19: 1.5 g

## 2014-04-19 MED ORDER — METOCLOPRAMIDE HCL 10 MG PO TABS: 5.0000 mg | ORAL_TABLET | Freq: Three times a day (TID) | ORAL | Status: DC | PRN

## 2014-04-19 MED ORDER — FENTANYL CITRATE 0.05 MG/ML IJ SOLN
INTRAMUSCULAR | Status: AC
  Administered 2014-04-19: 100 ug
  Filled 2014-04-19: qty 2

## 2014-04-19 MED ORDER — KCL IN DEXTROSE-NACL 20-5-0.45 MEQ/L-%-% IV SOLN
INTRAVENOUS | Status: DC
  Administered 2014-04-19: 17:00:00 via INTRAVENOUS
  Filled 2014-04-19 (×8): qty 1000

## 2014-04-19 MED ORDER — NEOSTIGMINE METHYLSULFATE 10 MG/10ML IV SOLN
INTRAVENOUS | Status: DC | PRN
  Administered 2014-04-19: 4 mg via INTRAVENOUS

## 2014-04-19 MED ORDER — ACETAMINOPHEN 325 MG PO TABS: 650.0000 mg | ORAL_TABLET | Freq: Four times a day (QID) | ORAL | Status: DC | PRN

## 2014-04-19 MED ORDER — METHOCARBAMOL 500 MG PO TABS: 500.0000 mg | ORAL_TABLET | Freq: Two times a day (BID) | ORAL | Status: DC

## 2014-04-19 MED ORDER — PROPOFOL 10 MG/ML IV BOLUS
INTRAVENOUS | Status: AC
  Filled 2014-04-19: qty 20

## 2014-04-19 MED ORDER — ONDANSETRON HCL 4 MG PO TABS: 4.0000 mg | ORAL_TABLET | Freq: Four times a day (QID) | ORAL | Status: DC | PRN

## 2014-04-19 MED ORDER — PROPOFOL 10 MG/ML IV BOLUS
INTRAVENOUS | Status: DC | PRN
  Administered 2014-04-19: 150 mg via INTRAVENOUS

## 2014-04-19 MED ORDER — PHENOL 1.4 % MT LIQD: 1.0000 | OROMUCOSAL | Status: DC | PRN

## 2014-04-19 MED ORDER — FENTANYL CITRATE 0.05 MG/ML IJ SOLN
INTRAMUSCULAR | Status: AC
  Filled 2014-04-19: qty 2

## 2014-04-19 MED ORDER — 0.9 % SODIUM CHLORIDE (POUR BTL) OPTIME
TOPICAL | Status: DC | PRN
  Administered 2014-04-19: 1000 mL

## 2014-04-19 MED ORDER — EPHEDRINE SULFATE 50 MG/ML IJ SOLN
INTRAMUSCULAR | Status: DC | PRN
  Administered 2014-04-19 (×3): 10 mg via INTRAVENOUS
  Administered 2014-04-19: 15 mg via INTRAVENOUS

## 2014-04-19 MED ORDER — TAMSULOSIN HCL 0.4 MG PO CAPS
0.4000 mg | ORAL_CAPSULE | Freq: Every day | ORAL | Status: DC
  Administered 2014-04-20: 0.4 mg via ORAL
  Filled 2014-04-19 (×3): qty 1

## 2014-04-19 MED ORDER — BUPIVACAINE LIPOSOME 1.3 % IJ SUSP
INTRAMUSCULAR | Status: DC | PRN
  Administered 2014-04-19: 20 mL

## 2014-04-19 MED ORDER — ASPIRIN EC 325 MG PO TBEC
325.0000 mg | DELAYED_RELEASE_TABLET | Freq: Every day | ORAL | Status: DC
Start: 2014-04-20 — End: 2014-04-21
  Administered 2014-04-20 – 2014-04-21 (×2): 325 mg via ORAL
  Filled 2014-04-19 (×3): qty 1

## 2014-04-19 SURGICAL SUPPLY — 59 items
BANDAGE ESMARK 6X9 LF (GAUZE/BANDAGES/DRESSINGS) ×1 IMPLANT
BLADE SAG 18X100X1.27 (BLADE) ×3 IMPLANT
BLADE SAW SGTL 13X75X1.27 (BLADE) ×3 IMPLANT
BLADE SURG ROTATE 9660 (MISCELLANEOUS) IMPLANT
BNDG ELASTIC 6X10 VLCR STRL LF (GAUZE/BANDAGES/DRESSINGS) ×3 IMPLANT
BNDG ESMARK 6X9 LF (GAUZE/BANDAGES/DRESSINGS) ×3
BOWL SMART MIX CTS (DISPOSABLE) ×3 IMPLANT
CAP KNEE TOTAL 3 SIGMA ×3 IMPLANT
CEMENT HV SMART SET (Cement) ×6 IMPLANT
COVER SURGICAL LIGHT HANDLE (MISCELLANEOUS) ×3 IMPLANT
CUFF TOURNIQUET SINGLE 34IN LL (TOURNIQUET CUFF) ×3 IMPLANT
CUFF TOURNIQUET SINGLE 44IN (TOURNIQUET CUFF) IMPLANT
DRAPE EXTREMITY T 121X128X90 (DRAPE) ×3 IMPLANT
DRAPE IMP U-DRAPE 54X76 (DRAPES) ×3 IMPLANT
DRAPE U-SHAPE 47X51 STRL (DRAPES) ×3 IMPLANT
DURAPREP 26ML APPLICATOR (WOUND CARE) ×6 IMPLANT
ELECT REM PT RETURN 9FT ADLT (ELECTROSURGICAL) ×3
ELECTRODE REM PT RTRN 9FT ADLT (ELECTROSURGICAL) ×1 IMPLANT
EVACUATOR 1/8 PVC DRAIN (DRAIN) ×3 IMPLANT
GAUZE SPONGE 4X4 12PLY STRL (GAUZE/BANDAGES/DRESSINGS) ×3 IMPLANT
GAUZE XEROFORM 1X8 LF (GAUZE/BANDAGES/DRESSINGS) ×3 IMPLANT
GLOVE BIO SURGEON STRL SZ7.5 (GLOVE) ×3 IMPLANT
GLOVE BIO SURGEON STRL SZ8.5 (GLOVE) ×3 IMPLANT
GLOVE BIOGEL PI IND STRL 8 (GLOVE) ×1 IMPLANT
GLOVE BIOGEL PI IND STRL 9 (GLOVE) ×1 IMPLANT
GLOVE BIOGEL PI INDICATOR 8 (GLOVE) ×2
GLOVE BIOGEL PI INDICATOR 9 (GLOVE) ×2
GOWN STRL REUS W/ TWL LRG LVL3 (GOWN DISPOSABLE) ×1 IMPLANT
GOWN STRL REUS W/ TWL XL LVL3 (GOWN DISPOSABLE) ×2 IMPLANT
GOWN STRL REUS W/TWL LRG LVL3 (GOWN DISPOSABLE) ×2
GOWN STRL REUS W/TWL XL LVL3 (GOWN DISPOSABLE) ×4
HANDPIECE INTERPULSE COAX TIP (DISPOSABLE) ×2
HOOD PEEL AWAY FACE SHEILD DIS (HOOD) ×6 IMPLANT
KIT BASIN OR (CUSTOM PROCEDURE TRAY) ×3 IMPLANT
KIT ROOM TURNOVER OR (KITS) ×3 IMPLANT
MANIFOLD NEPTUNE II (INSTRUMENTS) ×3 IMPLANT
NDL SAFETY ECLIPSE 18X1.5 (NEEDLE) IMPLANT
NEEDLE 22X1 1/2 (OR ONLY) (NEEDLE) ×3 IMPLANT
NEEDLE HYPO 18GX1.5 SHARP (NEEDLE)
NEEDLE SPNL 18GX3.5 QUINCKE PK (NEEDLE) ×3 IMPLANT
NS IRRIG 1000ML POUR BTL (IV SOLUTION) ×3 IMPLANT
PACK TOTAL JOINT (CUSTOM PROCEDURE TRAY) ×3 IMPLANT
PACK UNIVERSAL I (CUSTOM PROCEDURE TRAY) ×3 IMPLANT
PAD ARMBOARD 7.5X6 YLW CONV (MISCELLANEOUS) ×6 IMPLANT
PADDING CAST COTTON 6X4 STRL (CAST SUPPLIES) ×3 IMPLANT
SET HNDPC FAN SPRY TIP SCT (DISPOSABLE) ×1 IMPLANT
STAPLER VISISTAT 35W (STAPLE) ×3 IMPLANT
SUCTION FRAZIER TIP 10 FR DISP (SUCTIONS) ×3 IMPLANT
SUT VIC AB 0 CTX 36 (SUTURE) ×2
SUT VIC AB 0 CTX36XBRD ANTBCTR (SUTURE) ×1 IMPLANT
SUT VIC AB 1 CTX 36 (SUTURE) ×2
SUT VIC AB 1 CTX36XBRD ANBCTR (SUTURE) ×1 IMPLANT
SUT VIC AB 2-0 CT1 27 (SUTURE) ×2
SUT VIC AB 2-0 CT1 TAPERPNT 27 (SUTURE) ×1 IMPLANT
SYR 30ML LL (SYRINGE) ×3 IMPLANT
SYR 50ML LL SCALE MARK (SYRINGE) ×3 IMPLANT
TOWEL OR 17X24 6PK STRL BLUE (TOWEL DISPOSABLE) ×3 IMPLANT
TOWEL OR 17X26 10 PK STRL BLUE (TOWEL DISPOSABLE) ×3 IMPLANT
WATER STERILE IRR 1000ML POUR (IV SOLUTION) IMPLANT

## 2014-04-19 NOTE — Transfer of Care (Signed)
Immediate Anesthesia Transfer of Care Note  Patient: Jared [name]  Procedure(s) Performed: Procedure(s): RIGHT TOTAL KNEE ARTHROPLASTY (Right)  Patient Location: PACU  Anesthesia Type:General and Regional  Level of Consciousness: patient cooperative and responds to stimulation  Airway & Oxygen Therapy: Patient Spontanous Breathing and Patient connected to nasal cannula oxygen  Post-op Assessment: Report given to PACU RN and Post -op Vital signs reviewed and stable  Post vital signs: Reviewed and stable  Complications: No apparent anesthesia complications

## 2014-04-19 NOTE — Progress Notes (Signed)
Orthopedic Tech Progress Note Patient Details:  Jared [name] [birth year] [id number]  CPM Right Knee CPM Right Knee: On Right Knee Flexion (Degrees): 60 Right Knee Extension (Degrees): 0 Additional Comments: trapeze bar patient helper Viewed order from doctor's order list  [name], [name] 04/19/2014, 12:48 PM

## 2014-04-19 NOTE — Progress Notes (Signed)
Orthopedic Tech Progress Note Patient Details:  Jared [name] 15-Dec-1945 [id number] On cpm at 8:05 pm 0-60 Patient ID: Jared [name], male   DOB: 15-Dec-1945, 68 y.o.   MRN: [id number]   Jared [name], Jared [name] 04/19/2014, 8:05 PM

## 2014-04-19 NOTE — Op Note (Signed)
PATIENT ID:      Jared MoundRoger L Schwanz  MRN:     [id number] DOB/AGE:    1946/03/21 / 68 y.o.       OPERATIVE REPORT    DATE OF PROCEDURE:  04/19/2014       PREOPERATIVE DIAGNOSIS:   RIGHT KNEE ARTHRITIS      Estimated body mass index is 29.72 kg/(m^2) as calculated from the following:   Height as of 04/13/14: 5\' 11"  (1.803 m).   Weight as of this encounter: 96.616 kg (213 lb).                                                        POSTOPERATIVE DIAGNOSIS:   RIGHT KNEE ARTHRITIS                                                                      PROCEDURE:  Procedure(s): RIGHT TOTAL KNEE ARTHROPLASTY Using Depuy Sigma RP implants #5R Femur, #5Tibia, 10mm Sigma RP bearing, 41 Patella     SURGEON: Johonna Binette J    ASSISTANT:   Eric K. [employer] PA-C   (Present and scrubbed throughout the case, critical for assistance with exposure, retraction, instrumentation, and closure.)         ANESTHESIA: GET, ACB, Exparel  DRAINS: 2 medium hemovac in knee   TOURNIQUET TIME: 75min   COMPLICATIONS:  None     SPECIMENS: None   INDICATIONS FOR PROCEDURE: The patient has  RIGHT KNEE ARTHRITIS, varus deformities, XR shows bone on bone arthritis. Patient has failed all conservative measures including anti-inflammatory medicines, narcotics, attempts at  exercise and weight loss, cortisone injections and viscosupplementation.  Risks and benefits of surgery have been discussed, questions answered.   DESCRIPTION OF PROCEDURE: The patient identified by armband, received  IV antibiotics, in [facility] holding area at [facility]. Patient taken to the operating room, appropriate anesthetic  monitors were attached, and general endotracheal anesthesia induced with  the patient in supine position, Foley catheter was inserted. Tourniquet  applied high to the operative thigh. Lateral post and foot positioner  applied to the table, the lower extremity was then prepped and draped  in usual sterile fashion from the ankle to  the tourniquet. Time-out procedure was performed. The limb was wrapped with an Esmarch bandage and the tourniquet inflated to 350 mmHg. We began the operation by making the anterior midline incision starting at handbreadth above the patella going over the patella 1 cm medial to and  4 cm distal to the tibial tubercle. Small bleeders in the skin and the  subcutaneous tissue identified and cauterized. Transverse retinaculum was incised and reflected medially and a medial parapatellar arthrotomy was accomplished. the patella was everted and theprepatellar fat pad resected. The superficial medial collateral  ligament was then elevated from anterior to posterior along the proximal  flare of the tibia and anterior half of the menisci resected. The knee was hyperflexed exposing bone on bone arthritis. Peripheral and notch osteophytes as well as the cruciate ligaments were then resected. We continued to  work our way around posteriorly  along the proximal tibia, and externally  rotated the tibia subluxing it out from underneath the femur. A McHale  retractor was placed through the notch and a lateral Hohmann retractor  placed, and we then drilled through the proximal tibia in line with the  axis of the tibia followed by an intramedullary guide rod and 2-degree  posterior slope cutting guide. The tibial cutting guide was pinned into place  allowing resection of 4 mm of bone medially and about 12 mm of bone  laterally because of her varus deformity. Satisfied with the tibial resection, we then  entered the distal femur 2 mm anterior to the PCL origin with the  intramedullary guide rod and applied the distal femoral cutting guide  set at 11mm, with 5 degrees of valgus. This was pinned along the  epicondylar axis. At this point, the distal femoral cut was accomplished without difficulty. We then sized for a #5R femoral component and pinned the guide in 3 degrees of external rotation.The chamfer cutting guide was  pinned into place. The anterior, posterior, and chamfer cuts were accomplished without difficulty followed by  the box cutting guide and the box cut. We also removed posterior osteophytes from the posterior femoral condyles. At this  time, the knee was brought into full extension. We checked our  extension and flexion gaps and found them symmetric at 10mm.  The patella thickness measured at 25 mm. We set the cutting guide at 15 and removed the posterior 10 mm  of the patella, sized for a 41 button and drilled the lollipop. The knee  was then once again hyperflexed exposing the proximal tibia. We sized for a #5 tibial base plate, applied the smokestack and the conical reamer followed by the the Delta fin keel punch. We then hammered into place the Sigma RP trial femoral component, inserted a 10-mm trial bearing, trial patellar button, and took the knee through range of motion from 0-130 degrees. No thumb pressure was required for patellar  tracking. At this point, all trial components were removed, a double batch of DePuy HV cement with 1500 mg of Zinacef was mixed and applied to all bony metallic mating surfaces except for the posterior condyles of the femur itself. In order, we  hammered into place the tibial tray and removed excess cement, the femoral component and removed excess cement, a 10-mm Sigma RP bearing  was inserted, and the knee brought to full extension with compression.  The patellar button was clamped into place, and excess cement  removed. While the cement cured the wound was irrigated out with normal saline solution pulse lavage, and medium Hemovac drains were placed from an anterolateral  approach. Ligament stability and patellar tracking were checked and found to be excellent. The parapatellar arthrotomy was closed with  running #1 Vicryl suture. The subcutaneous tissue with 0 and 2-0 undyed  Vicryl suture, and the skin with skin staples. A dressing of Xeroform,  4 x 4, dressing  sponges, Webril, and Ace wrap applied. The patient  awakened, extubated, and taken to recovery room without difficulty.   Salote Weidmann J 04/19/2014, 11:18 AM

## 2014-04-19 NOTE — Anesthesia Procedure Notes (Addendum)
Anesthesia Regional Block:  Adductor canal block  Pre-Anesthetic Checklist: ,, timeout performed, Correct Patient, Correct Site, Correct Laterality, Correct Procedure, Correct Position, site marked, Risks and benefits discussed,  Surgical consent,  Pre-op evaluation,  At surgeon's request and post-op pain management  Laterality: Right  Prep: Maximum Sterile Barrier Precautions used and chloraprep       Needles:  Injection technique: Single-shot  Needle Type: Echogenic Stimulator Needle     Needle Length: 10cm 10 cm Needle Gauge: 22 and 22 G    Additional Needles:  Procedures: ultrasound guided (picture in chart) Adductor canal block Narrative:  Injection made incrementally with aspirations every 5 mL.  Additional Notes: R AC block, 30ml .5% marcaine with epi, multiple asp, US guidance, talked to patient throughout, no complications   Procedure Name: Intubation Date/Time: 04/19/2014 9:57 AM Performed by: Rogelia BogaMUELLER, Wagner Tanzi P Pre-anesthesia Checklist: Patient identified, Timeout performed, Emergency Drugs available, Suction available and Patient being monitored Patient Re-evaluated:Patient Re-evaluated prior to inductionOxygen Delivery Method: Circle system utilized Preoxygenation: Pre-oxygenation with 100% oxygen Intubation Type: IV induction Ventilation: Mask ventilation without difficulty and Oral airway inserted - appropriate to patient size Laryngoscope Size: Mac and 4 Grade View: Grade I Tube type: Oral Tube size: 7.0 mm Number of attempts: 1 Airway Equipment and Method: Stylet and Bite block Placement Confirmation: ETT inserted through vocal cords under direct vision,  positive ETCO2 and breath sounds checked- equal and bilateral Secured at: 23 cm Tube secured with: Tape Comments: Intubation performed by Esau Grewianarose Onyango, SRNA

## 2014-04-19 NOTE — Interval H&P Note (Signed)
History and Physical Interval Note:  04/19/2014 9:08 AM  Jared [name]  has presented today for surgery, with the diagnosis of RIGHT KNEE ARTHRITIS  The various methods of treatment have been discussed with the patient and family. After consideration of risks, benefits and other options for treatment, the patient has consented to  Procedure(s): RIGHT TOTAL KNEE ARTHROPLASTY (Right) as a surgical intervention .  The patient's history has been reviewed, patient examined, no change in status, stable for surgery.  I have reviewed the patient's chart and labs.  Questions were answered to the patient's satisfaction.     Nestor LewandowskyOWAN,[name] J

## 2014-04-19 NOTE — Anesthesia Postprocedure Evaluation (Signed)
  Anesthesia Post-op Note  Patient: [name]  Procedure(s) Performed: Procedure(s): RIGHT TOTAL KNEE ARTHROPLASTY (Right)  Patient Location: PACU  Anesthesia Type:General  Level of Consciousness: awake and alert   Airway and Oxygen Therapy: Patient Spontanous Breathing and Patient connected to nasal cannula oxygen  Post-op Pain: mild  Post-op Assessment: Post-op Vital signs reviewed, Patient's Cardiovascular Status Stable, Respiratory Function Stable, Patent Airway and No signs of Nausea or vomiting  Post-op Vital Signs: Reviewed and stable  Last Vitals:  Filed Vitals:   04/19/14 1149  BP:   Pulse:   Temp: 36.1 C  Resp:     Complications: No apparent anesthesia complications

## 2014-04-19 NOTE — Progress Notes (Signed)
Utilization review completed.  

## 2014-04-19 NOTE — Evaluation (Signed)
Physical Therapy Evaluation Patient Details Name: Jared MoundRoger L Landsberg MRN: [id number] DOB: 28-May-1945 Today's Date: 04/19/2014   History of Present Illness  68 y.o. male [facility] for elective R TKA.  Pt with significant PMHx of L TKA (per pt ~3 years ago), lumbar surgery, HTN, and R RTC repair.   Clinical Impression  Pt is POD #0 and is moving at supervision level with RW around the room.  He is hopeful to d/c home tomorrow and from a physical standpoint I think this might be possible.  He has had his other knee replaced and is familiar with the process.   PT to follow acutely for deficits listed below.       Follow Up Recommendations Home health PT    Equipment Recommendations  None recommended by PT    Recommendations for Other Services   NA    Precautions / Restrictions Precautions Precautions: Knee Precaution Booklet Issued: Yes (comment) Precaution Comments: knee exercise handout given, WBAT status reviewed Restrictions Weight Bearing Restrictions: Yes RLE Weight Bearing: Weight bearing as tolerated      Mobility  Bed Mobility Overal bed mobility: Modified Independent             General bed mobility comments: Pt able to move to EOB using bed rail for leverage.  No help needed to progress right leg over EOB.   Transfers Overall transfer level: Needs assistance Equipment used: Rolling walker (2 wheeled) Transfers: Sit to/from Stand Sit to Stand: Supervision         General transfer comment: preformed multiple times from both bed and recliner chair.  Safe technique.   Ambulation/Gait Ambulation/Gait assistance: Supervision Ambulation Distance (Feet): 25 Feet Assistive device: Rolling walker (2 wheeled) Gait Pattern/deviations: Step-through pattern;Antalgic Gait velocity: decreased   General Gait Details: Pt with mildly antalgic gait pattern, good knee control in stance, and step through gait pattern already.          Balance Overall  balance assessment: Needs assistance Sitting-balance support: Feet supported;No upper extremity supported Sitting balance-Leahy Scale: Good     Standing balance support: Bilateral upper extremity supported;No upper extremity supported;Single extremity supported Standing balance-Leahy Scale: Good                               Pertinent Vitals/Pain Pain Assessment: 0-10 Pain Score: 5  Pain Location: right knee Pain Descriptors / Indicators: Aching;Burning Pain Intervention(s): Limited activity within patient's tolerance;Monitored during session;Repositioned;Premedicated before session    Home Living Family/patient expects to be discharged to:: Private residence Living Arrangements: Spouse/significant other Available Help at Discharge: Family;Available 24 hours/day Type of Home: House Home Access: Stairs to enter Entrance Stairs-Rails: Left;Right;Can reach both Entrance Stairs-Number of Steps: 3-4 Home Layout: One level Home Equipment: Walker - 2 wheels;Bedside commode;Shower seat      Prior Function Level of Independence: Independent         Comments: drives, works as a custodian day shift     Higher education careers adviserHand Dominance   Dominant Hand: Right    Extremity/Trunk Assessment   Upper Extremity Assessment: Defer to OT evaluation           Lower Extremity Assessment: RLE deficits/detail RLE Deficits / Details:  right leg with normal post op weakness and pain.  Pt with 4/5 ankle DF, 3-/5 knee extension, 3/5 hip flexion.     Cervical / Trunk Assessment: Other exceptions  Communication   Communication: No difficulties  Cognition  Arousal/Alertness: Lethargic;Suspect due to medications Behavior During Therapy: [facility] for tasks assessed/performed Overall Cognitive Status: Within Functional Limits for tasks assessed                         Exercises Total Joint Exercises Ankle Circles/Pumps: AROM;Both;20 reps;Supine      Assessment/Plan    PT Assessment  Patient needs continued PT services  PT Diagnosis Difficulty walking;Abnormality of gait;Generalized weakness;Acute pain   PT Problem List Decreased strength;Decreased range of motion;Decreased activity tolerance;Decreased balance;Decreased mobility;Decreased knowledge of use of DME;Decreased knowledge of precautions;Pain  PT Treatment Interventions DME instruction;Gait training;Stair training;Functional mobility training;Therapeutic activities;Therapeutic exercise;Patient/family education;Modalities;Manual techniques   PT Goals (Current goals can be found in the Care Plan section) Acute Rehab PT Goals Patient Stated Goal: to go home tomorrow PT Goal Formulation: With patient Time For Goal Achievement: 04/26/14 Potential to Achieve Goals: Good    Frequency 7X/week    End of Session   Activity Tolerance: Patient tolerated treatment well Patient left: in chair;with call bell/phone within reach Nurse Communication: Mobility status (to RN)         Time: 4098-1191: 1614-1641 PT Time Calculation (min) (ACUTE ONLY): 27 min   Charges:   PT Evaluation $Initial PT Evaluation Tier I: 1 Procedure PT Treatments $Gait Training: 8-22 mins        Doreene Forrey B. Anmol Fleck, PT, DPT [phone]   04/19/2014, 4:56 PM

## 2014-04-20 ENCOUNTER — Encounter (HOSPITAL_COMMUNITY): Payer: Self-pay | Admitting: Orthopedic Surgery

## 2014-04-20 LAB — CBC
HCT: 37.5 % — ABNORMAL LOW (ref 39.0–52.0)
HEMOGLOBIN: 12.8 g/dL — AB (ref 13.0–17.0)
MCH: 31.4 pg (ref 26.0–34.0)
MCHC: 34.1 g/dL (ref 30.0–36.0)
MCV: 91.9 fL (ref 78.0–100.0)
PLATELETS: 162 10*3/uL (ref 150–400)
RBC: 4.08 MIL/uL — ABNORMAL LOW (ref 4.22–5.81)
RDW: 12.5 % (ref 11.5–15.5)
WBC: 7.6 10*3/uL (ref 4.0–10.5)

## 2014-04-20 NOTE — Progress Notes (Signed)
Patient ID: Jared MoundRoger L Pressley, male   DOB: Aug 18, 1945, 68 y.o.   MRN: [id number] PATIENT ID: Jared MoundRoger L Mcglynn  MRN: [id number]  DOB/AGE:  Aug 18, 1945 / 68 y.o.  1 Day Post-Op Procedure(s) (LRB): RIGHT TOTAL KNEE ARTHROPLASTY (Right)    PROGRESS NOTE Subjective: Patient is alert, oriented, no Nausea, no Vomiting, yes passing gas, no Bowel Movement. Taking PO well. Denies SOB, Chest or Calf Pain. Using Incentive Spirometer, PAS in place. Ambulate 25 ft yesterday, CPM , 0-60 Patient reports pain as 3 on 0-10 scale  .    Objective: Vital signs in last 24 hours: Filed Vitals:   04/20/14 0000 04/20/14 0041 04/20/14 0400 04/20/14 0623  BP:  160/76  154/74  Pulse:  66  68  Temp:  100 F (37.8 C)  98.9 F (37.2 C)  TempSrc:  Oral    Resp: 20 14 16 16   Weight:      SpO2: 98% 98% 97% 98%      Intake/Output from previous day: I/O last 3 completed shifts: In: 2005 [P.O.:730; I.V.:1165; IV Piggyback:110] Out: 1330 [Urine:980; Drains:350]   Intake/Output this shift:     LABORATORY DATA:  Recent Labs  04/20/14 0515  WBC 7.6  HGB 12.8*  HCT 37.5*  PLT 162    Examination: Neurologically intact ABD soft Neurovascular intact Sensation intact distally Intact pulses distally Dorsiflexion/Plantar flexion intact Incision: dressing C/D/I No cellulitis present Compartment soft} Blood and plasma separated in drain indicating minimal recent drainage, drain pulled without difficulty.  Assessment:   1 Day Post-Op Procedure(s) (LRB): RIGHT TOTAL KNEE ARTHROPLASTY (Right) ADDITIONAL DIAGNOSIS: Expected Acute Blood Loss Anemia,   Plan: PT/OT WBAT, CPM 5/hrs day until ROM 0-90 degrees, then D/C CPM DVT Prophylaxis:  SCDx72hrs, ASA 325 mg BID x 2 weeks DISCHARGE PLAN: Home, when he passes physical therapy, which may be today DISCHARGE NEEDS: HHPT, CPM, Walker and 3-in-1 comode seat     Bryton Romagnoli J 04/20/2014, 8:00 AM

## 2014-04-20 NOTE — Progress Notes (Signed)
Now pt denies pain. States "i will hold off for a while." meds returned to pyxis at this time.

## 2014-04-20 NOTE — Plan of Care (Signed)
Problem: Phase II Progression Outcomes Goal: Tolerating diet Outcome: Completed/Met Date Met:  04/20/14  Problem: Phase III Progression Outcomes Goal: Pain controlled on oral analgesia Outcome: Progressing Has not received IV pain medications x1 shift Goal: Incision clean - minimal/no drainage Outcome: Progressing Goal: Anticoagulant follow-up in place Outcome: Completed/Met Date Met:  04/20/14 SCDs, ASA     Problem: Discharge Progression Outcomes Goal: CMS/Neurovascular status at or above baseline Outcome: Completed/Met Date Met:  04/20/14 Goal: Tolerates diet Outcome: Completed/Met Date Met:  04/20/14 Goal: Follows weight - bearing limitations Outcome: Completed/Met Date Met:  04/20/14

## 2014-04-20 NOTE — Progress Notes (Signed)
Physical Therapy Treatment Patient Details Name: Jared MoundRoger L Correll MRN: [id number] DOB: [birth year] Today's Date: 04/20/2014    History of Present Illness 68 [facility].o. [facility] for elective R TKA.  Pt with significant PMHx of L TKA (per pt ~3 years ago), lumbar surgery, HTN, and R RTC repair.     PT Comments    Pt is POD #1 and is progressing well with his mobility. He is generally, supervision with transfers and gait.  He was able to show proficiency on the steps and knee exercises were initiated.  PT will continue to follow acutely until d/c.    Follow Up Recommendations  Home health PT     Equipment Recommendations  None recommended by PT    Recommendations for Other Services   NA     Precautions / Restrictions Precautions Precautions: Knee Restrictions RLE Weight Bearing: Weight bearing as tolerated    Mobility         Transfers Overall transfer level: Needs assistance Equipment used: Rolling walker (2 wheeled) Transfers: Sit to/from Stand Sit to Stand: Supervision         General transfer comment: supervision for safety  Ambulation/Gait Ambulation/Gait assistance: Supervision Ambulation Distance (Feet): 180 Feet Assistive device: Rolling walker (2 wheeled) Gait Pattern/deviations: Step-through pattern;Antalgic Gait velocity: decreased Gait velocity interpretation: Below normal speed for age/gender General Gait Details: Pt with guarded, antalgic gait pattern.  Verbal cues for upright posture.  Gait pattern less stiff with increased gait distance and after preforming seated ROM exercise in therapy gym.    Stairs Stairs: Yes Stairs assistance: Supervision Stair Management: Two rails;Step to pattern;Forwards Number of Stairs: 5 General stair comments: Verbal cues for correct LE sequencing.  Supervision for safety         Balance Overall balance assessment: Needs assistance Sitting-balance support: Feet supported;No upper extremity  supported Sitting balance-Leahy Scale: Good     Standing balance support: Bilateral upper extremity supported;Single extremity supported;No upper extremity supported Standing balance-Leahy Scale: Good                      Cognition Arousal/Alertness: Awake/alert Behavior During Therapy: WFL for tasks assessed/performed Overall Cognitive Status: Within Functional Limits for tasks assessed                      Exercises Total Joint Exercises Long Arc Quad: AROM;Right;10 reps;Seated Knee Flexion: AROM;AAROM;10 reps;Seated        Pertinent Vitals/Pain Pain Assessment: 0-10 Pain Score: 4  Pain Location: right knee Pain Descriptors / Indicators: Burning;Aching Pain Intervention(s): Limited activity within patient's tolerance;Monitored during session;Premedicated before session;Repositioned           PT Goals (current goals can now be found in the care plan section) Acute Rehab PT Goals Patient Stated Goal: home today Progress towards PT goals: Progressing toward goals    Frequency  7X/week    PT Plan Current plan remains appropriate       End of Session   Activity Tolerance: Patient limited by pain Patient left: in chair;with call bell/phone within reach     Time: 1034-1059 PT Time Calculation (min) (ACUTE ONLY): 25 min  Charges:  $Gait Training: 8-22 mins $Therapeutic Exercise: 8-22 mins                      Kallen Delatorre B. Izacc Demeyer, PT, DPT [phone]   04/20/2014, 3:36 PM

## 2014-04-20 NOTE — Progress Notes (Signed)
Physical Therapy Treatment Patient Details Name: Jared MoundRoger L Chanthavong MRN: [id number] DOB: 1945-06-30 Today's Date: 04/20/2014    History of Present Illness 68 y.o. male [facility] for elective R TKA.  Pt with significant PMHx of L TKA (per pt ~3 years ago), lumbar surgery, HTN, and R RTC repair.     PT Comments    Pt has not d/c home due to retaining urine in his bladder.  We were able to progress gait, finish reviewing his knee exercises and practice the stairs once again.  PT will continue to follow acutely until he d/c.    Follow Up Recommendations  Home health PT     Equipment Recommendations  None recommended by PT    Recommendations for Other Services   NA     Precautions / Restrictions Precautions Precautions: Knee Precaution Comments: reviewed no pillow under operated knee Restrictions RLE Weight Bearing: Weight bearing as tolerated    Mobility  Bed Mobility Overal bed mobility: Modified Independent             General bed mobility comments: Pt using his pant leg to pull his right knee up in the bed.   Transfers Overall transfer level: Needs assistance Equipment used: Rolling walker (2 wheeled) Transfers: Sit to/from Stand Sit to Stand: Supervision         General transfer comment: supervision for safety  Ambulation/Gait Ambulation/Gait assistance: Supervision Ambulation Distance (Feet): 200 Feet Assistive device: Rolling walker (2 wheeled) Gait Pattern/deviations: Step-through pattern;Antalgic Gait velocity: decreased Gait velocity interpretation: Below normal speed for age/gender General Gait Details: supervision for safety, continued cues for upright posture and as pt fatigued, cues to stay inside of RW when turing corners.    Stairs Stairs: Yes Stairs assistance: Supervision Stair Management: Two rails;Step to pattern;Forwards Number of Stairs: 5 General stair comments: no cues needed for correct leg sequencing.  Supervision for  safety.          Balance Overall balance assessment: Needs assistance Sitting-balance support: Feet supported;No upper extremity supported Sitting balance-Leahy Scale: Good     Standing balance support: Bilateral upper extremity supported;Single extremity supported;No upper extremity supported Standing balance-Leahy Scale: Good                      Cognition Arousal/Alertness: Awake/alert Behavior During Therapy: WFL for tasks assessed/performed Overall Cognitive Status: Within Functional Limits for tasks assessed                      Exercises Total Joint Exercises Ankle Circles/Pumps: AROM;Both;20 reps;Supine Quad Sets: AROM;Right;10 reps;Supine Towel Squeeze: AROM;Both;10 reps;Supine Short Arc Quad: AROM;Right;10 reps;Supine Heel Slides: AAROM;Right;10 reps;Supine Hip ABduction/ADduction: AROM;Right;10 reps;Supine Straight Leg Raises: AROM;Right;10 reps;Supine Long Arc Quad: AROM;Right;10 reps;Seated Knee Flexion: AROM;AAROM;10 reps;Seated        Pertinent Vitals/Pain Pain Assessment: 0-10 Pain Score: 5  Pain Location: right knee Pain Descriptors / Indicators: Aching;Burning Pain Intervention(s): Limited activity within patient's tolerance;Monitored during session;Repositioned           PT Goals (current goals can now be found in the care plan section) Acute Rehab PT Goals Patient Stated Goal: home today Progress towards PT goals: Progressing toward goals    Frequency  7X/week    PT Plan Current plan remains appropriate       End of Session   Activity Tolerance: Patient limited by fatigue;Patient limited by pain Patient left: in bed;in CPM;with call bell/phone within reach     Time:  [id number] PT Time Calculation (min) (ACUTE ONLY): 28 min  Charges:  $Gait Training: 8-22 mins $Therapeutic Exercise: 8-22 mins                      Javanni Maring B. Amry Cathy, PT, DPT [phone]   04/20/2014, 3:42 PM

## 2014-04-20 NOTE — Discharge Summary (Signed)
Patient ID: Jared MoundRoger L Recendez MRN: [id number] DOB/AGE: 12/26/1945 68 y.o.  Admit date: 04/19/2014 Discharge date: 04/20/2014  Admission Diagnoses:  Active Problems:   Primary osteoarthritis of knee   Discharge Diagnoses:  Same  Past Medical History  Diagnosis Date  . Arthritis     knees and back  . Hypertension     stress test done about 12 yrs ago - results wnl per pt.    Surgeries: Procedure(s): RIGHT TOTAL KNEE ARTHROPLASTY on 04/19/2014   Consultants:    Discharged Condition: Improved  Hospital Course: Jared [name] is an 68 y.o. male who was admitted 04/19/2014 for operative treatment of R Knee DJD. Patient has severe unremitting pain that affects sleep, daily activities, and work/hobbies. After pre-op clearance the patient was taken to the operating room on 04/19/2014 and underwent  Procedure(s): RIGHT TOTAL KNEE ARTHROPLASTY.    Patient was given perioperative antibiotics: Anti-infectives    Start     Dose/Rate Route Frequency Ordered Stop   04/19/14 1018  cefUROXime (ZINACEF) injection  Status:  Discontinued       As needed 04/19/14 1018 04/19/14 1145   04/19/14 0600  ceFAZolin (ANCEF) IVPB 2 g/50 mL premix     2 g100 mL/hr over 30 Minutes Intravenous On call to O.R. 04/18/14 1310 04/19/14 1003       Patient was given sequential compression devices, early ambulation, and chemoprophylaxis to prevent DVT.  Patient benefited maximally from hospital stay and there were no complications.    Recent vital signs: Patient Vitals for the past 24 hrs:  BP Temp Temp src Pulse Resp SpO2  04/20/14 0623 (!) 154/74 mmHg 98.9 F (37.2 C) - 68 16 98 %  04/20/14 0400 - - - - 16 97 %  04/20/14 0041 (!) 160/76 mmHg 100 F (37.8 C) Oral 66 14 98 %  04/20/14 0000 - - - - 20 98 %  04/19/14 2028 (!) 153/73 mmHg 98.7 F (37.1 C) - 69 14 98 %  04/19/14 2000 - - - - 16 98 %  04/19/14 1522 (!) 141/69 mmHg 97.8 F (36.6 C) Oral 65 14 100 %  04/19/14 1500 - 97.6 F (36.4 C) - - - 100 %   04/19/14 1451 (!) 160/76 mmHg - - - 11 -  04/19/14 1445 - - - - - 100 %  04/19/14 1436 (!) 167/75 mmHg - - - 11 -  04/19/14 1430 - - - - 18 100 %  04/19/14 1421 (!) 159/74 mmHg - - - 11 -  04/19/14 1415 - - - - 13 -  04/19/14 1406 (!) 163/76 mmHg - - - 17 -  04/19/14 1400 - 97.5 F (36.4 C) - - 16 100 %  04/19/14 1351 (!) 150/69 mmHg - - - 13 100 %  04/19/14 1345 - - - - 19 100 %  04/19/14 1338 (!) 159/68 mmHg - - - 20 -  04/19/14 1330 - - - - 20 100 %  04/19/14 1321 (!) 153/75 mmHg - - - 17 -     Recent laboratory studies:  Recent Labs  04/20/14 0515  WBC 7.6  HGB 12.8*  HCT 37.5*  PLT 162     Discharge Medications:     Medication List    STOP taking these medications        warfarin 5 MG tablet  Commonly known as:  COUMADIN      TAKE these medications        amLODipine 10 MG tablet  Commonly known as:  NORVASC  Take 10 mg by mouth daily.     aspirin EC 325 MG tablet  Take 1 tablet (325 mg total) by mouth 2 (two) times daily.     methocarbamol 500 MG tablet  Commonly known as:  ROBAXIN  Take 1 tablet (500 mg total) by mouth 2 (two) times daily with a meal.     multivitamins ther. w/minerals Tabs tablet  Take 1 tablet by mouth daily.     oxyCODONE-acetaminophen 5-325 MG per tablet  Commonly known as:  ROXICET  Take 1 tablet by mouth every 4 (four) hours as needed.     tamsulosin 0.4 MG Caps capsule  Commonly known as:  FLOMAX  Take 0.4 mg by mouth.        Diagnostic Studies: Dg Chest 2 View  04/13/2014   CLINICAL DATA:  Hypertension.  EXAM: CHEST  2 VIEW  COMPARISON:  04/09/2011.  FINDINGS: Mediastinum hilar structures normal. Lungs are clear. Cardiomegaly with normal pulmonary vascularity. No pleural effusion or pneumothorax. Degenerative changes thoracic spine .  IMPRESSION: Stable cardiomegaly.  No acute cardiopulmonary disease.   Electronically Signed   By: Maisie Fushomas  Register   On: 04/13/2014 10:11    Disposition: 06-Home-Health Care Svc       Discharge Instructions    CPM    Complete by:  As directed   Continuous passive motion machine (CPM):      Use the CPM from 0 to 40 for 4 hours per day.      You may increase by 10 per day.  You may break it up into 2 or 3 sessions per day.      Use CPM for 2 weeks or until you are told to stop.     Call MD / Call 911    Complete by:  As directed   If you experience chest pain or shortness of breath, CALL 911 and be transported to the hospital emergency room.  If you develope a fever above 101 F, pus (white drainage) or increased drainage or redness at the wound, or calf pain, call your surgeon's office.     Constipation Prevention    Complete by:  As directed   Drink plenty of fluids.  Prune juice may be helpful.  You may use a stool softener, such as Colace (over the counter) 100 mg twice a day.  Use MiraLax (over the counter) for constipation as needed.     Diet - low sodium heart healthy    Complete by:  As directed      Increase activity slowly as tolerated    Complete by:  As directed            Follow-up Information    Follow up with Nestor LewandowskyOWAN,Brittain Hosie J, MD In 2 weeks.   Specialty:  Orthopedic Surgery   Contact information:   [address] [place] [address] [phone]       Follow up with [facility].   Why:  they will call to schedule home physical therapy visits   Contact information:   [address] [place] [address] [phone]       Follow up with Nestor LewandowskyOWAN,Lashai Grosch J, MD In 1 week.   Specialty:  Orthopedic Surgery   Contact information:   [address] [place] [address] [phone]        Signed: Nestor LewandowskyROWAN,Griffith Santilli J 04/20/2014, 1:17 PM

## 2014-04-20 NOTE — Progress Notes (Signed)
Pt has been voiding 50-15150ml at a time today.  Bladder scan at 1430 showed 858 ml of urine in bladder.  In & out catheter completed by myself, 950 ml of urine emptied from bladder.  Nursing to continue to monitor.

## 2014-04-20 NOTE — Progress Notes (Signed)
Occupational Therapy Evaluation Patient Details Name: Jared [name] MRN: [id number] DOB: 1946-05-08 Today's Date: 04/20/2014    History of Present Illness 68 y.o. male [facility] for elective R TKA.  Pt with significant PMHx of L TKA (per pt ~3 years ago), lumbar surgery, HTN, and R RTC repair.    Clinical Impression   PTA pt lived at home and was independent with ADLs and IADLs. Pt is mobilizing well and has hx of L TKA and is familiar with compensatory techniques. Pt hopes to d/c today and feel that he is safe to d/c from OT standpoint. No further acute OT needs. Pt will have 24/7 assist from his wife.     Follow Up Recommendations  No OT follow up;Supervision - Intermittent    Equipment Recommendations  None recommended by OT    Recommendations for Other Services       Precautions / Restrictions Precautions Precautions: Knee Precaution Comments: Reviewed knee precautions Restrictions Weight Bearing Restrictions: Yes RLE Weight Bearing: Weight bearing as tolerated      Mobility Bed Mobility Overal bed mobility: Modified Independent             General bed mobility comments: No use of bed rail. HOB flat.   Transfers Overall transfer level: Needs assistance Equipment used: Rolling walker (2 wheeled) Transfers: Sit to/from Stand Sit to Stand: Supervision         General transfer comment: Supervision for safety.          ADL Overall ADL's : Needs assistance/impaired Eating/Feeding: Independent;Sitting   Grooming: Standing;Supervision/safety   Upper Body Bathing: Set up;Sitting   Lower Body Bathing: Minimal assistance;Sit to/from stand   Upper Body Dressing : Set up;Sitting   Lower Body Dressing: Minimal assistance;Sit to/from stand   Toilet Transfer: Ambulation;RW;Supervision/safety StatisticianToilet Transfer Details (indicate cue type and reason): stood over toilet Toileting- Clothing Manipulation and Hygiene: Psychiatristupervision/safety      Tub/Shower Transfer Details (indicate cue type and reason): pt plans to sponge bathe Functional mobility during ADLs: Rolling walker;Supervision/safety       Vision  Pt wears glasses for reading and reports no change from baseline.                    Perception Perception Perception Tested?: No   Praxis Praxis Praxis tested?: Within functional limits    Pertinent Vitals/Pain Pain Assessment: 0-10 Pain Score: 3  Pain Location: R knee Pain Descriptors / Indicators: Aching Pain Intervention(s): Monitored during session;Repositioned     Hand Dominance Right   Extremity/Trunk Assessment Upper Extremity Assessment Upper Extremity Assessment: Overall WFL for tasks assessed   Lower Extremity Assessment Lower Extremity Assessment: Defer to PT evaluation   Cervical / Trunk Assessment Cervical / Trunk Assessment: Other exceptions Cervical / Trunk Exceptions: pt with history of back surgery pt reports >10 years ago   Communication Communication Communication: No difficulties   Cognition Arousal/Alertness: Awake/alert Behavior During Therapy: WFL for tasks assessed/performed Overall Cognitive Status: Within Functional Limits for tasks assessed                                Home Living Family/patient expects to be discharged to:: Private residence Living Arrangements: Spouse/significant other Available Help at Discharge: Family;Available 24 hours/day Type of Home: House Home Access: Stairs to enter Entergy CorporationEntrance Stairs-Number of Steps: 3-4 Entrance Stairs-Rails: Left;Right;Can reach both Home Layout: One level     Bathroom Shower/Tub: Walk-in  shower;Door   Foot LockerBathroom Toilet: Standard     Home Equipment: Environmental consultantWalker - 2 wheels;Bedside commode;Shower seat          Prior Functioning/Environment Level of Independence: Independent        Comments: drives, works as a Arboriculturistcustodian day shift    OT Diagnosis: Acute pain    End of Session Equipment Utilized  During Treatment: Gait belt;Rolling walker CPM Right Knee CPM Right Knee: Off (pt removed from CPM for OT session).   Activity Tolerance: Patient tolerated treatment well Patient left: in chair;with call bell/phone within reach   Time: 0926-0952 OT Time Calculation (min): 26 min Charges:  OT General Charges $OT Visit: 1 Procedure OT Evaluation $Initial OT Evaluation Tier I: 1 Procedure OT Treatments $Self Care/Home Management : 8-22 mins  Rae LipsMiller, Jeyla Bulger M 04/20/2014, 9:55 AM   Carney LivingLeeAnn Marie Roneisha Stern, OTR/L Occupational Therapist [phone] (pager)

## 2014-04-20 NOTE — Progress Notes (Signed)
PO pain meds due in about 30 min pt stated he will wait until then to receive the medication

## 2014-04-20 NOTE — Progress Notes (Signed)
Dannielle BurnEric Phillips, PA said to cancel discharge unless patient is able to void.  Nursing to continue to monitor.

## 2014-04-21 LAB — CBC
HCT: 32.6 % — ABNORMAL LOW (ref 39.0–52.0)
Hemoglobin: 11.1 g/dL — ABNORMAL LOW (ref 13.0–17.0)
MCH: 30.7 pg (ref 26.0–34.0)
MCHC: 34 g/dL (ref 30.0–36.0)
MCV: 90.1 fL (ref 78.0–100.0)
Platelets: 155 10*3/uL (ref 150–400)
RBC: 3.62 MIL/uL — ABNORMAL LOW (ref 4.22–5.81)
RDW: 12.4 % (ref 11.5–15.5)
WBC: 11.1 10*3/uL — ABNORMAL HIGH (ref 4.0–10.5)

## 2014-04-21 NOTE — Plan of Care (Signed)
Problem: Phase II Progression Outcomes Goal: Ambulates Outcome: Completed/Met Date Met:  04/21/14 Goal: Discharge plan established Outcome: Completed/Met Date Met:  04/21/14 Goal: Other Phase II Outcomes/Goals Outcome: Completed/Met Date Met:  04/21/14  Problem: Phase III Progression Outcomes Goal: Pain controlled on oral analgesia Outcome: Completed/Met Date Met:  04/21/14 Goal: Ambulates Outcome: Completed/Met Date Met:  04/21/14 Goal: Incision clean - minimal/no drainage Outcome: Completed/Met Date Met:  04/21/14 Goal: Discharge plan remains appropriate-arrangements made Outcome: Completed/Met Date Met:  04/21/14 Goal: Other Phase III Outcomes/Goals Outcome: Completed/Met Date Met:  04/21/14  Problem: Discharge Progression Outcomes Goal: Barriers To Progression Addressed/Resolved Outcome: Completed/Met Date Met:  04/21/14 Goal: Anticoagulant follow-up in place Outcome: Completed/Met Date Met:  04/21/14 Goal: Pain controlled with appropriate interventions Outcome: Completed/Met Date Met:  04/21/14 Goal: Hemodynamically stable Outcome: Completed/Met Date Met:  10/40/45 Goal: Complications resolved/controlled Outcome: Completed/Met Date Met:  04/21/14 Goal: Activity appropriate for discharge plan Outcome: Completed/Met Date Met:  04/21/14 Goal: Ambulates safely using assistive device Outcome: Completed/Met Date Met:  04/21/14 Goal: Discharge plan in place and appropriate Outcome: Completed/Met Date Met:  04/21/14 Goal: Negotiates stairs Outcome: Completed/Met Date Met:  04/21/14 Goal: Demonstrates ADLs as appropriate Outcome: Completed/Met Date Met:  04/21/14 Goal: Incision without S/S infection Outcome: Completed/Met Date Met:  04/21/14 Goal: Other Discharge Outcomes/Goals Outcome: Completed/Met Date Met:  04/21/14

## 2014-04-21 NOTE — Progress Notes (Signed)
Physical Therapy Treatment Patient Details Name: Jared MoundRoger L [name] MRN: [id number] DOB: 1945-11-09 Today's Date: 04/21/2014    History of Present Illness 68 [facility].o. [facility] for elective R TKA.  Pt with significant PMHx of L TKA (per pt ~3 years ago), lumbar surgery, HTN, and R RTC repair.     PT Comments    Pt motivated and eager for d/c.  D/c scheduled for this date if pt can empty bladder.  Follow Up Recommendations  Home health PT     Equipment Recommendations  None recommended by PT    Recommendations for Other Services       Precautions / Restrictions Precautions Precautions: Knee Precaution Comments: reviewed no pillow under operated knee Restrictions Weight Bearing Restrictions: No RLE Weight Bearing: Weight bearing as tolerated    Mobility  Bed Mobility Overal bed mobility: Modified Independent                Transfers Overall transfer level: Needs assistance Equipment used: Rolling walker (2 wheeled) Transfers: Sit to/from Stand Sit to Stand: Supervision         General transfer comment: supervision for safety  Ambulation/Gait Ambulation/Gait assistance: Supervision Ambulation Distance (Feet): 160 Feet Assistive device: Rolling walker (2 wheeled) Gait Pattern/deviations: Step-to pattern;Step-through pattern;Decreased step length - right;Decreased step length - left;Shuffle;Trunk flexed Gait velocity: decreased   General Gait Details: cues for posture, R knee ext, position from RW and sequence   Stairs         General stair comments: Pt states he is comfortable with ability on stairs and reviews sequence without cues  Wheelchair Mobility    Modified Rankin (Stroke Patients Only)       Balance                                    Cognition Arousal/Alertness: Awake/alert Behavior During Therapy: WFL for tasks assessed/performed Overall Cognitive Status: Within Functional Limits for tasks assessed                       Exercises Total Joint Exercises Ankle Circles/Pumps: AROM;Both;20 reps;Supine Quad Sets: AROM;Right;Supine;20 reps Heel Slides: AAROM;Right;Supine;20 reps Straight Leg Raises: Right;Supine;20 reps;AAROM;AROM Long Arc Quad: Right;Seated;AAROM;AROM;20 reps    General Comments        Pertinent Vitals/Pain Pain Assessment: 0-10 Pain Score: 6  Pain Location: R knee Pain Descriptors / Indicators: Aching;Sore Pain Intervention(s): Limited activity within patient's tolerance;Monitored during session;Patient requesting pain meds-RN notified    Home Living                      Prior Function            PT Goals (current goals can now be found in the care plan section) Acute Rehab PT Goals Patient Stated Goal: home today PT Goal Formulation: With patient Time For Goal Achievement: 04/26/14 Potential to Achieve Goals: Good Progress towards PT goals: Progressing toward goals    Frequency  7X/week    PT Plan Current plan remains appropriate    Co-evaluation             End of Session Equipment Utilized During Treatment: Gait belt Activity Tolerance: Patient tolerated treatment well Patient left: in chair;with call bell/phone within reach     Time: 0830-0906 PT Time Calculation (min) (ACUTE ONLY): 36 min  Charges:  $Gait Training: 8-22 mins $Therapeutic Exercise:  8-22 mins                    G Codes:      Jared [name] 04/21/2014, 11:43 AM

## 2014-04-21 NOTE — Progress Notes (Signed)
PATIENT ID: Jared MoundRoger L [name]  MRN: [id number]  DOB/AGE:  21-Dec-1945 / 68 y.o.  2 Days Post-Op Procedure(s) (LRB): RIGHT TOTAL KNEE ARTHROPLASTY (Right)    PROGRESS NOTE Subjective: Patient is alert, oriented, no Nausea, no Vomiting, yes passing gas, no Bowel Movement. Taking PO well. Denies SOB, Chest or Calf Pain. Using Incentive Spirometer, PAS in place. Ambulate WBAT, CPM 0-60 Patient reports pain as moderate  .    Objective: Vital signs in last 24 hours: Filed Vitals:   04/20/14 1817 04/20/14 1958 04/20/14 2220 04/21/14 0547  BP:   168/67 134/75  Pulse:   74 65  Temp: 99.9 F (37.7 C)  99.1 F (37.3 C) 98.4 F (36.9 C)  TempSrc:      Resp:  18 18 16   Weight:      SpO2:  99% 99% 98%      Intake/Output from previous day: I/O last 3 completed shifts: In: 1090 [P.O.:1090] Out: 2425 [Urine:2075; Drains:350]   Intake/Output this shift:     LABORATORY DATA:  Recent Labs  04/20/14 0515 04/21/14 0334  WBC 7.6 11.1*  HGB 12.8* 11.1*  HCT 37.5* 32.6*  PLT 162 155    Examination: Neurologically intact Neurovascular intact Sensation intact distally Intact pulses distally Dorsiflexion/Plantar flexion intact Incision: dressing C/D/I and no drainage No cellulitis present Compartment soft}  Assessment:   2 Days Post-Op Procedure(s) (LRB): RIGHT TOTAL KNEE ARTHROPLASTY (Right) ADDITIONAL DIAGNOSIS: Expected Acute Blood Loss Anemia,   Plan: PT/OT WBAT, CPM 5/hrs day until ROM 0-90 degrees, then D/C CPM DVT Prophylaxis:  SCDx72hrs, ASA 325 mg BID x 2 weeks DISCHARGE PLAN: Home, today after pt able to void on his own. DISCHARGE NEEDS: HHPT, HHRN, CPM, Walker and 3-in-1 comode seat     PHILLIPS, ERIC R 04/21/2014, 7:38 AM

## 2014-04-22 NOTE — Progress Notes (Signed)
CARE MANAGEMENT NOTE 04/22/2014  Patient:  Jared MoundGREEN,Jared [name]   Account Number:  [id number]  Date Initiated:  04/22/2014  Documentation initiated by:  [facility] Endoscopy IncKRIEG,Etsuko Dierolf  Subjective/Objective Assessment:   s/p rt TKA     Action/Plan:   Pt/OT evals- recommended HHPT   Anticipated DC Date:  04/21/2014   Anticipated DC Plan:  HOME W HOME HEALTH SERVICES      DC Planning Services  CM consult      [facility]  DURABLE MEDICAL EQUIPMENT   Choice offered to / List presented to:  C-1 Patient   DME arranged  CPM      DME agency  [employer]     HH arranged  HH-2 PT      HH agency  [facility]   Status of service:  Completed, signed off Medicare Important Message given?   (If response is "NO", the following Medicare IM given date fields will be blank) Date Medicare IM given:   Medicare IM given by:   Date Additional Medicare IM given:   Additional Medicare IM given by:    Discharge Disposition:  HOME W HOME HEALTH SERVICES  Per UR Regulation:    If discussed at Long Length of Stay Meetings, dates discussed:    Comments:  04/22/14 Spoke with patient on 04/20/14 about HHC and he chose [facility]. [facility]ed [facility] at [facility] and [facility] HHPT. [employer] is providing CPM. Patient has a rolling walker and 3N1 at home. Jacquelynn CreeMary Chivon Lepage RN, BSN, CCM

## 2015-04-26 ENCOUNTER — Other Ambulatory Visit: Payer: Self-pay | Admitting: Family Medicine

## 2015-04-26 DIAGNOSIS — R9389 Abnormal findings on diagnostic imaging of other specified body structures: Secondary | ICD-10-CM

## 2015-04-26 DIAGNOSIS — R911 Solitary pulmonary nodule: Secondary | ICD-10-CM

## 2015-05-05 ENCOUNTER — Ambulatory Visit
Admission: RE | Admit: 2015-05-05 | Discharge: 2015-05-05 | Disposition: A | Discharge status: Home / Self Care | Payer: Medicare Other | Source: Ambulatory Visit | Attending: Family Medicine | Admitting: Family Medicine

## 2015-05-05 DIAGNOSIS — R9389 Abnormal findings on diagnostic imaging of other specified body structures: Secondary | ICD-10-CM

## 2015-05-05 DIAGNOSIS — R911 Solitary pulmonary nodule: Secondary | ICD-10-CM

## 2015-05-05 MED ORDER — IOPAMIDOL (ISOVUE-300) INJECTION 61%: 75.0000 mL | Freq: Once | INTRAVENOUS | Status: DC | PRN

## 2015-11-21 ENCOUNTER — Other Ambulatory Visit: Payer: Self-pay | Admitting: Gastroenterology

## 2017-09-10 ENCOUNTER — Other Ambulatory Visit: Payer: Self-pay | Admitting: Orthopedic Surgery

## 2017-09-19 NOTE — Pre-Procedure Instructions (Signed)
HESTER FORGET  09/19/2017     Your procedure is scheduled on Wednesday, Oct 02, 2017 at 12:45 PM.   Report to [facility] Entrance "A" Admitting Office at 10:45 AM.   Call this number if you have problems the morning of surgery: [phone]   Questions prior to day of surgery, please call [phone] between 8 & 4 PM.   Remember:  Do not eat food or drink liquids after midnight Tuesday, 10/01/17.  Take these medicines the morning of surgery with A SIP OF WATER: Amlodipine (Norvasc), Oxycodone - if needed, may use eye drops.  Stop NSAIDS (Aleve, Ibuprofen, etc) and Multivitamins 7 days prior to surgery. Do not use Aspirin products 7 days prior to surgery.   Do not wear jewelry.  Do not wear lotions, powders, cologne or deodorant.  Men may shave face and neck.  Do not bring valuables to the hospital.  [facility] is not responsible for any belongings or valuables.  Contacts, dentures or bridgework may not be worn into surgery.  Leave your suitcase in the car.  After surgery it may be brought to your room.  For patients admitted to the hospital, discharge time will be determined by your treatment team.  [facility] - Preparing for Surgery  Before surgery, you can play an important role.  Because skin is not sterile, your skin needs to be as free of germs as possible.  You can reduce the number of germs on you skin by washing with CHG (chlorahexidine gluconate) soap before surgery.  CHG is an antiseptic cleaner which kills germs and bonds with the skin to continue killing germs even after washing.  Please DO NOT use if you have an allergy to CHG or antibacterial soaps.  If your skin becomes reddened/irritated stop using the CHG and inform your nurse when you arrive at Short Stay.  Do not shave (including legs and underarms) for at least 48 hours prior to the first CHG shower.  You may shave your face.  Please follow these instructions carefully:   1.  Shower with CHG Soap  the night before surgery and the                    morning of Surgery.  2.  If you choose to wash your hair, wash your hair first as usual with your       normal shampoo.  3.  After you shampoo, rinse your hair and body thoroughly to remove the shampoo.  4.  Use CHG as you would any other liquid soap.  You can apply chg directly       to the skin and wash gently with scrungie or a clean washcloth.  5.  Apply the CHG Soap to your body ONLY FROM THE NECK DOWN.        Do not use on open wounds or open sores.  Avoid contact with your eyes, ears, mouth and genitals (private parts).  Wash genitals (private parts) with your normal soap.  6.  Wash thoroughly, paying special attention to the area where your surgery        will be performed.  7.  Thoroughly rinse your body with warm water from the neck down.  8.  DO NOT shower/wash with your normal soap after using and rinsing off       the CHG Soap.  9.  Pat yourself dry with a clean towel.  10.  Wear clean pajamas.            11.  Place clean sheets on your bed the night of your first shower and do not        sleep with pets.  Day of Surgery  Shower as above. Do not apply any lotions/deodorants the morning of surgery.  Please wear clean clothes to the hospital.   Please read over the fact sheets that you were given.

## 2017-09-20 ENCOUNTER — Encounter (HOSPITAL_COMMUNITY): Payer: Self-pay

## 2017-09-20 ENCOUNTER — Other Ambulatory Visit: Payer: Self-pay

## 2017-09-20 ENCOUNTER — Ambulatory Visit (HOSPITAL_COMMUNITY)
Admission: RE | Admit: 2017-09-20 | Discharge: 2017-09-20 | Disposition: A | Discharge status: Home / Self Care | Payer: Medicare Other | Source: Ambulatory Visit | Attending: Orthopedic Surgery | Admitting: Orthopedic Surgery

## 2017-09-20 ENCOUNTER — Encounter (HOSPITAL_COMMUNITY)
Admission: RE | Admit: 2017-09-20 | Discharge: 2017-09-20 | Disposition: A | Discharge status: Home / Self Care | Payer: Medicare Other | Source: Ambulatory Visit | Attending: Orthopedic Surgery | Admitting: Orthopedic Surgery

## 2017-09-20 DIAGNOSIS — R001 Bradycardia, unspecified: Secondary | ICD-10-CM | POA: Insufficient documentation

## 2017-09-20 DIAGNOSIS — Z01810 Encounter for preprocedural cardiovascular examination: Secondary | ICD-10-CM | POA: Diagnosis not present

## 2017-09-20 DIAGNOSIS — Z01812 Encounter for preprocedural laboratory examination: Secondary | ICD-10-CM | POA: Insufficient documentation

## 2017-09-20 DIAGNOSIS — Z01811 Encounter for preprocedural respiratory examination: Secondary | ICD-10-CM

## 2017-09-20 HISTORY — DX: Headache, unspecified: R51.9

## 2017-09-20 HISTORY — DX: Headache: R51

## 2017-09-20 LAB — CBC WITH DIFFERENTIAL/PLATELET
Basophils Absolute: 0 10*3/uL (ref 0.0–0.1)
Basophils Relative: 0 %
EOS ABS: 0.3 10*3/uL (ref 0.0–0.7)
EOS PCT: 8 %
HCT: 41.6 % (ref 39.0–52.0)
Hemoglobin: 14.1 g/dL (ref 13.0–17.0)
LYMPHS ABS: 2.2 10*3/uL (ref 0.7–4.0)
Lymphocytes Relative: 55 %
MCH: 31.5 pg (ref 26.0–34.0)
MCHC: 33.9 g/dL (ref 30.0–36.0)
MCV: 93.1 fL (ref 78.0–100.0)
MONOS PCT: 10 %
Monocytes Absolute: 0.4 10*3/uL (ref 0.1–1.0)
Neutro Abs: 1.1 10*3/uL — ABNORMAL LOW (ref 1.7–7.7)
Neutrophils Relative %: 27 %
PLATELETS: 175 10*3/uL (ref 150–400)
RBC: 4.47 MIL/uL (ref 4.22–5.81)
RDW: 13.7 % (ref 11.5–15.5)
WBC: 4 10*3/uL (ref 4.0–10.5)

## 2017-09-20 LAB — PROTIME-INR
INR: 0.95
PROTHROMBIN TIME: 12.6 s (ref 11.4–15.2)

## 2017-09-20 LAB — URINALYSIS, ROUTINE W REFLEX MICROSCOPIC
BILIRUBIN URINE: NEGATIVE
Glucose, UA: NEGATIVE mg/dL
HGB URINE DIPSTICK: NEGATIVE
KETONES UR: NEGATIVE mg/dL
Leukocytes, UA: NEGATIVE
Nitrite: NEGATIVE
Protein, ur: NEGATIVE mg/dL
Specific Gravity, Urine: 1.016 (ref 1.005–1.030)
pH: 6 (ref 5.0–8.0)

## 2017-09-20 LAB — BASIC METABOLIC PANEL
Anion gap: 7 (ref 5–15)
BUN: 12 mg/dL (ref 6–20)
CO2: 26 mmol/L (ref 22–32)
CREATININE: 1.29 mg/dL — AB (ref 0.61–1.24)
Calcium: 9.4 mg/dL (ref 8.9–10.3)
Chloride: 110 mmol/L (ref 101–111)
GFR calc Af Amer: >60 mL/min (ref >60)
GFR calc non Af Amer: 53 mL/min — ABNORMAL LOW (ref >60)
Glucose, Bld: 86 mg/dL (ref 65–99)
Potassium: 4 mmol/L (ref 3.5–5.1)
Sodium: 143 mmol/L (ref 135–145)

## 2017-09-20 LAB — SURGICAL PCR SCREEN
MRSA, PCR: NEGATIVE
Staphylococcus aureus: NEGATIVE

## 2017-09-20 LAB — TYPE AND SCREEN
ABO/RH(D): O POS
Antibody Screen: NEGATIVE

## 2017-09-20 LAB — APTT: aPTT: 32 seconds (ref 24–36)

## 2017-09-20 NOTE — Progress Notes (Addendum)
PCP is Dr. Catha Gosselin  Denies seeing a cardiologist. Denies chest pain, cough,or fever. Denies ever having a card cath, Stress test, or echo

## 2017-10-01 DIAGNOSIS — M1612 Unilateral primary osteoarthritis, left hip: Secondary | ICD-10-CM | POA: Diagnosis present

## 2017-10-01 MED ORDER — TRANEXAMIC ACID 1000 MG/10ML IV SOLN
2000.0000 mg | INTRAVENOUS | Status: AC
  Administered 2017-10-02: 2000 mg via TOPICAL
  Filled 2017-10-01: qty 20

## 2017-10-01 MED ORDER — BUPIVACAINE LIPOSOME 1.3 % IJ SUSP
20.0000 mL | INTRAMUSCULAR | Status: AC
  Administered 2017-10-02: 20 mL
  Filled 2017-10-01: qty 20

## 2017-10-01 NOTE — H&P (Signed)
TOTAL HIP ADMISSION H&P  Patient is admitted for left total hip arthroplasty.  Subjective:  Chief Complaint: left hip pain  HPI: Jared [name], 72 y.o. male, has a history of pain and functional disability in the left hip(s) due to arthritis and patient has failed non-surgical conservative treatments for greater than 12 weeks to include NSAID's and/or analgesics, corticosteriod injections, flexibility and strengthening excercises and activity modification.  Onset of symptoms was gradual starting 2 years ago with gradually worsening course since that time.The patient noted no past surgery on the left hip(s).  Patient currently rates pain in the left hip at 10 out of 10 with activity. Patient has night pain, worsening of pain with activity and weight bearing, trendelenberg gait, pain that interfers with activities of daily living and pain with passive range of motion. Patient has evidence of periarticular osteophytes and joint space narrowing by imaging studies. This condition presents safety issues increasing the risk of falls.    There is no current active infection.  Patient Active Problem List   Diagnosis Date Noted  . Primary osteoarthritis of knee 04/19/2014  . BP (high blood pressure) 08/27/2011  . Arthritis of left knee 04/16/2011   Past Medical History:  Diagnosis Date  . Arthritis    knees and back  . Headache   . Hypertension    stress test done about 12 yrs ago - results wnl per pt.    Past Surgical History:  Procedure Laterality Date  . BACK SURGERY  2002  . Enlarged Prostate     enlarged prostate-takes Flomax  . ROTATOR CUFF REPAIR  2009   right shoulder  . TOTAL KNEE ARTHROPLASTY  04/16/2011   Procedure: TOTAL KNEE ARTHROPLASTY;  Surgeon: Nestor Lewandowsky;  Location: [facility] OR;  Service: Orthopedics;  Laterality: Left;  LEFT TOTAL KNEE ARTHROPLASTY   . TOTAL KNEE ARTHROPLASTY Right 04/19/2014   Procedure: RIGHT TOTAL KNEE ARTHROPLASTY;  Surgeon: Nestor Lewandowsky, MD;  Location: [facility]  OR;  Service: Orthopedics;  Laterality: Right;    No current facility-administered medications for this encounter.    Current Outpatient Medications  Medication Sig Dispense Refill Last Dose  . amLODipine (NORVASC) 10 MG tablet Take 10 mg by mouth daily.    04/19/2014 at Unknown time  . brimonidine (ALPHAGAN) 0.2 % ophthalmic solution Place 1 drop into both eyes 2 (two) times daily.      . dorzolamide-timolol (COSOPT) 22.3-6.8 MG/ML ophthalmic solution Place 1 drop into both eyes 2 (two) times daily.  11   . latanoprost (XALATAN) 0.005 % ophthalmic solution Place 1 drop into both eyes at bedtime.     . Multiple Vitamins-Minerals (MULTIVITAMINS THER. W/MINERALS) TABS Take 1 tablet by mouth daily.     Past Week at Unknown time  . naproxen sodium (ALEVE) 220 MG tablet Take 220 mg by mouth 2 (two) times daily as needed (for pain.).     Marland Kitchen tamsulosin (FLOMAX) 0.4 MG CAPS capsule Take 0.4 mg by mouth daily after supper.    04/19/2014 at Unknown time  . oxyCODONE-acetaminophen (ROXICET) 5-325 MG per tablet Take 1 tablet by mouth every 4 (four) hours as needed. 60 tablet 0    No Known Allergies  Social History   Tobacco Use  . Smoking status: Former Smoker    Packs/day: 1.00    Years: 10.00    Pack years: 10.00    Types: Cigarettes    Last attempt to quit: 09/07/1974    Years since quitting: 43.0  .  Smokeless tobacco: Never Used  Substance Use Topics  . Alcohol use: No    Comment: quit 1976 - beer    Family History  Problem Relation Age of Onset  . Migraines Mother   . Cancer Sister   . Cancer Sister      Review of Systems  Constitutional: Negative.   HENT: Positive for nosebleeds and sinus pain.   Eyes: Negative.   Respiratory: Negative.   Cardiovascular:       HTN  Gastrointestinal: Negative.   Genitourinary:       Enlarged prostate and ED  Musculoskeletal: Positive for joint pain.  Skin: Negative.   Neurological: Negative.   Endo/Heme/Allergies: Negative.    Psychiatric/Behavioral: Negative.     Objective:  Physical Exam  Constitutional: He is oriented to person, place, and time. He appears well-developed and well-nourished.  HENT:  Head: Normocephalic and atraumatic.  Eyes: Pupils are equal, round, and reactive to light.  Neck: Normal range of motion. Neck supple.  Cardiovascular: Intact distal pulses.  Respiratory: Effort normal.  Musculoskeletal: He exhibits tenderness.  he has a left-sided limp.  Internal rotation reproduces his pain.  Foot tap is negative.  If you attempt to abduct past 30 that also reproduces his pain.  Neurological: He is alert and oriented to person, place, and time.  Skin: Skin is warm and dry.  Psychiatric: He has a normal mood and affect. His behavior is normal. Judgment and thought content normal.    Vital signs in last 24 hours:    Labs:   Estimated body mass index is 30.1 kg/m as calculated from the following:   Height as of 09/20/17:  (1.803 m).   Weight as of 09/20/17: 97.9 kg (215 lb 12.8 oz).   Imaging Review Plain radiographs demonstrate moderate osteoarthritis with large peripheral osteophytes and drop osteophyte.      Preoperative templating of the joint replacement has been completed, documented, and submitted to the Operating Room personnel in order to optimize intra-operative equipment management.     Assessment/Plan:  End stage arthritis, left hip(s)  The patient history, physical examination, clinical judgement of the provider and imaging studies are consistent with end stage degenerative joint disease of the left hip(s) and total hip arthroplasty is deemed medically necessary. The treatment options including medical management, injection therapy, arthroscopy and arthroplasty were discussed at length. The risks and benefits of total hip arthroplasty were presented and reviewed. The risks due to aseptic loosening, infection, stiffness, dislocation/subluxation,  thromboembolic  complications and other imponderables were discussed.  The patient acknowledged the explanation, agreed to proceed with the plan and consent was signed. Patient is being admitted for inpatient treatment for surgery, pain control, PT, OT, prophylactic antibiotics, VTE prophylaxis, progressive ambulation and ADL's and discharge planning.The patient is planning to be discharged home with home health services.

## 2017-10-02 ENCOUNTER — Inpatient Hospital Stay (HOSPITAL_COMMUNITY): Payer: Medicare Other | Admitting: Anesthesiology

## 2017-10-02 ENCOUNTER — Encounter (HOSPITAL_COMMUNITY)
Admission: RE | Disposition: A | Discharge status: Home / Self Care | Payer: Self-pay | Source: Ambulatory Visit | Attending: Orthopedic Surgery

## 2017-10-02 ENCOUNTER — Encounter (HOSPITAL_COMMUNITY): Payer: Self-pay | Admitting: *Deleted

## 2017-10-02 ENCOUNTER — Inpatient Hospital Stay (HOSPITAL_COMMUNITY)
Admission: RE | Admit: 2017-10-02 | Discharge: 2017-10-03 | DRG: 470 | Disposition: A | Discharge status: Home Health | Payer: Medicare Other | Source: Ambulatory Visit | Attending: Orthopedic Surgery | Admitting: Orthopedic Surgery

## 2017-10-02 ENCOUNTER — Inpatient Hospital Stay (HOSPITAL_COMMUNITY): Payer: Medicare Other

## 2017-10-02 ENCOUNTER — Other Ambulatory Visit: Payer: Self-pay

## 2017-10-02 DIAGNOSIS — Z87891 Personal history of nicotine dependence: Secondary | ICD-10-CM | POA: Diagnosis not present

## 2017-10-02 DIAGNOSIS — I10 Essential (primary) hypertension: Secondary | ICD-10-CM | POA: Diagnosis present

## 2017-10-02 DIAGNOSIS — M1612 Unilateral primary osteoarthritis, left hip: Principal | ICD-10-CM | POA: Diagnosis present

## 2017-10-02 DIAGNOSIS — N400 Enlarged prostate without lower urinary tract symptoms: Secondary | ICD-10-CM | POA: Diagnosis present

## 2017-10-02 DIAGNOSIS — Z419 Encounter for procedure for purposes other than remedying health state, unspecified: Secondary | ICD-10-CM

## 2017-10-02 DIAGNOSIS — D62 Acute posthemorrhagic anemia: Secondary | ICD-10-CM | POA: Diagnosis not present

## 2017-10-02 HISTORY — DX: Benign prostatic hyperplasia without lower urinary tract symptoms: N40.0

## 2017-10-02 HISTORY — PX: TOTAL HIP ARTHROPLASTY: SHX124

## 2017-10-02 SURGERY — ARTHROPLASTY, HIP, TOTAL, ANTERIOR APPROACH
Anesthesia: General | Site: Hip | Laterality: Left

## 2017-10-02 MED ORDER — GLYCOPYRROLATE 0.2 MG/ML IJ SOLN
INTRAMUSCULAR | Status: DC | PRN
  Administered 2017-10-02: 0.2 mg via INTRAVENOUS

## 2017-10-02 MED ORDER — ALUMINUM HYDROXIDE GEL 320 MG/5ML PO SUSP
15.0000 mL | ORAL | Status: DC | PRN
  Filled 2017-10-02: qty 30

## 2017-10-02 MED ORDER — KCL IN DEXTROSE-NACL 20-5-0.45 MEQ/L-%-% IV SOLN
INTRAVENOUS | Status: DC
  Administered 2017-10-02: via INTRAVENOUS
  Filled 2017-10-02: qty 1000

## 2017-10-02 MED ORDER — HYDROMORPHONE HCL 2 MG/ML IJ SOLN
0.5000 mg | INTRAMUSCULAR | Status: DC | PRN
  Administered 2017-10-02: 0.5 mg via INTRAVENOUS
  Filled 2017-10-02: qty 1

## 2017-10-02 MED ORDER — METHOCARBAMOL 500 MG PO TABS
500.0000 mg | ORAL_TABLET | Freq: Four times a day (QID) | ORAL | Status: DC | PRN
  Administered 2017-10-02: 500 mg via ORAL

## 2017-10-02 MED ORDER — FLEET ENEMA 7-19 GM/118ML RE ENEM: 1.0000 | ENEMA | Freq: Once | RECTAL | Status: DC | PRN

## 2017-10-02 MED ORDER — PROPOFOL 10 MG/ML IV BOLUS
INTRAVENOUS | Status: DC | PRN
  Administered 2017-10-02: 110 mg via INTRAVENOUS

## 2017-10-02 MED ORDER — DEXAMETHASONE SODIUM PHOSPHATE 10 MG/ML IJ SOLN
10.0000 mg | Freq: Once | INTRAMUSCULAR | Status: AC
  Administered 2017-10-03: 10 mg via INTRAVENOUS
  Filled 2017-10-02: qty 1

## 2017-10-02 MED ORDER — FENTANYL CITRATE (PF) 100 MCG/2ML IJ SOLN
25.0000 ug | INTRAMUSCULAR | Status: DC | PRN
  Administered 2017-10-02 (×2): 50 ug via INTRAVENOUS

## 2017-10-02 MED ORDER — LACTATED RINGERS IV SOLN
INTRAVENOUS | Status: DC
  Administered 2017-10-02: 11:00:00 via INTRAVENOUS

## 2017-10-02 MED ORDER — DIPHENHYDRAMINE HCL 12.5 MG/5ML PO ELIX: 12.5000 mg | ORAL_SOLUTION | ORAL | Status: DC | PRN

## 2017-10-02 MED ORDER — MENTHOL 3 MG MT LOZG: 1.0000 | LOZENGE | OROMUCOSAL | Status: DC | PRN

## 2017-10-02 MED ORDER — EPHEDRINE SULFATE 50 MG/ML IJ SOLN
INTRAMUSCULAR | Status: DC | PRN
  Administered 2017-10-02: 5 mg via INTRAVENOUS
  Administered 2017-10-02: 10 mg via INTRAVENOUS
  Administered 2017-10-02: 5 mg via INTRAVENOUS

## 2017-10-02 MED ORDER — CELECOXIB 200 MG PO CAPS
200.0000 mg | ORAL_CAPSULE | Freq: Two times a day (BID) | ORAL | Status: DC
  Administered 2017-10-02 – 2017-10-03 (×2): 200 mg via ORAL
  Filled 2017-10-02 (×2): qty 1

## 2017-10-02 MED ORDER — BRIMONIDINE TARTRATE 0.2 % OP SOLN
1.0000 [drp] | Freq: Two times a day (BID) | OPHTHALMIC | Status: DC
  Administered 2017-10-02 – 2017-10-03 (×2): 1 [drp] via OPHTHALMIC
  Filled 2017-10-02: qty 5

## 2017-10-02 MED ORDER — ROCURONIUM BROMIDE 50 MG/5ML IV SOLN
INTRAVENOUS | Status: AC
  Filled 2017-10-02: qty 1

## 2017-10-02 MED ORDER — TRANEXAMIC ACID 1000 MG/10ML IV SOLN
1000.0000 mg | INTRAVENOUS | Status: AC
  Administered 2017-10-02: 1000 mg via INTRAVENOUS
  Filled 2017-10-02: qty 1100

## 2017-10-02 MED ORDER — METHOCARBAMOL 500 MG PO TABS
ORAL_TABLET | ORAL | Status: AC
  Filled 2017-10-02: qty 1

## 2017-10-02 MED ORDER — ROCURONIUM BROMIDE 10 MG/ML (PF) SYRINGE
PREFILLED_SYRINGE | INTRAVENOUS | Status: DC | PRN
  Administered 2017-10-02: 50 mg via INTRAVENOUS

## 2017-10-02 MED ORDER — FENTANYL CITRATE (PF) 250 MCG/5ML IJ SOLN
INTRAMUSCULAR | Status: AC
  Filled 2017-10-02: qty 5

## 2017-10-02 MED ORDER — BUPIVACAINE-EPINEPHRINE (PF) 0.5% -1:200000 IJ SOLN
INTRAMUSCULAR | Status: AC
  Filled 2017-10-02: qty 60

## 2017-10-02 MED ORDER — FENTANYL CITRATE (PF) 250 MCG/5ML IJ SOLN
INTRAMUSCULAR | Status: DC | PRN
  Administered 2017-10-02 (×2): 25 ug via INTRAVENOUS
  Administered 2017-10-02: 100 ug via INTRAVENOUS

## 2017-10-02 MED ORDER — PANTOPRAZOLE SODIUM 40 MG PO TBEC
40.0000 mg | DELAYED_RELEASE_TABLET | Freq: Every day | ORAL | Status: DC
  Administered 2017-10-02 – 2017-10-03 (×2): 40 mg via ORAL
  Filled 2017-10-02 (×2): qty 1

## 2017-10-02 MED ORDER — ONDANSETRON HCL 4 MG/2ML IJ SOLN
INTRAMUSCULAR | Status: DC | PRN
  Administered 2017-10-02: 4 mg via INTRAVENOUS

## 2017-10-02 MED ORDER — AMLODIPINE BESYLATE 10 MG PO TABS
10.0000 mg | ORAL_TABLET | Freq: Every day | ORAL | Status: DC
  Administered 2017-10-03: 10 mg via ORAL
  Filled 2017-10-02: qty 1

## 2017-10-02 MED ORDER — CHLORHEXIDINE GLUCONATE 4 % EX LIQD: 60.0000 mL | Freq: Once | CUTANEOUS | Status: DC

## 2017-10-02 MED ORDER — TAMSULOSIN HCL 0.4 MG PO CAPS
0.4000 mg | ORAL_CAPSULE | Freq: Every day | ORAL | Status: DC
  Administered 2017-10-02: 0.4 mg via ORAL
  Filled 2017-10-02: qty 1

## 2017-10-02 MED ORDER — ONDANSETRON HCL 4 MG/2ML IJ SOLN
4.0000 mg | Freq: Four times a day (QID) | INTRAMUSCULAR | Status: DC | PRN
  Administered 2017-10-02: 4 mg via INTRAVENOUS
  Filled 2017-10-02: qty 2

## 2017-10-02 MED ORDER — ASPIRIN EC 81 MG PO TBEC: 81.0000 mg | DELAYED_RELEASE_TABLET | Freq: Two times a day (BID) | ORAL | 0 refills | Status: DC

## 2017-10-02 MED ORDER — PROPOFOL 10 MG/ML IV BOLUS
INTRAVENOUS | Status: AC
  Filled 2017-10-02: qty 20

## 2017-10-02 MED ORDER — METOCLOPRAMIDE HCL 5 MG PO TABS: 5.0000 mg | ORAL_TABLET | Freq: Three times a day (TID) | ORAL | Status: DC | PRN

## 2017-10-02 MED ORDER — LIDOCAINE 2% (20 MG/ML) 5 ML SYRINGE
INTRAMUSCULAR | Status: AC
  Filled 2017-10-02: qty 5

## 2017-10-02 MED ORDER — TIZANIDINE HCL 2 MG PO TABS: 2.0000 mg | ORAL_TABLET | Freq: Four times a day (QID) | ORAL | 0 refills | Status: DC | PRN

## 2017-10-02 MED ORDER — PHENYLEPHRINE HCL 10 MG/ML IJ SOLN
INTRAMUSCULAR | Status: AC
  Filled 2017-10-02: qty 1

## 2017-10-02 MED ORDER — BISACODYL 5 MG PO TBEC: 5.0000 mg | DELAYED_RELEASE_TABLET | Freq: Every day | ORAL | Status: DC | PRN

## 2017-10-02 MED ORDER — BUPIVACAINE-EPINEPHRINE (PF) 0.5% -1:200000 IJ SOLN
INTRAMUSCULAR | Status: DC | PRN
  Administered 2017-10-02: 50 mL

## 2017-10-02 MED ORDER — OXYCODONE-ACETAMINOPHEN 5-325 MG PO TABS: 1.0000 | ORAL_TABLET | ORAL | 0 refills | Status: DC | PRN

## 2017-10-02 MED ORDER — GABAPENTIN 300 MG PO CAPS
300.0000 mg | ORAL_CAPSULE | Freq: Three times a day (TID) | ORAL | Status: DC
  Administered 2017-10-02 – 2017-10-03 (×3): 300 mg via ORAL
  Filled 2017-10-02 (×3): qty 1

## 2017-10-02 MED ORDER — DORZOLAMIDE HCL-TIMOLOL MAL 2-0.5 % OP SOLN
1.0000 [drp] | Freq: Two times a day (BID) | OPHTHALMIC | Status: DC
  Administered 2017-10-02 – 2017-10-03 (×2): 1 [drp] via OPHTHALMIC
  Filled 2017-10-02: qty 10

## 2017-10-02 MED ORDER — ACETAMINOPHEN 325 MG PO TABS: 325.0000 mg | ORAL_TABLET | Freq: Four times a day (QID) | ORAL | Status: DC | PRN

## 2017-10-02 MED ORDER — DEXTROSE 5 % IV SOLN
500.0000 mg | Freq: Four times a day (QID) | INTRAVENOUS | Status: DC | PRN
  Filled 2017-10-02: qty 5

## 2017-10-02 MED ORDER — DEXAMETHASONE SODIUM PHOSPHATE 10 MG/ML IJ SOLN
INTRAMUSCULAR | Status: DC | PRN
  Administered 2017-10-02: 10 mg via INTRAVENOUS

## 2017-10-02 MED ORDER — OXYCODONE HCL 5 MG PO TABS
ORAL_TABLET | ORAL | Status: AC
  Filled 2017-10-02: qty 2

## 2017-10-02 MED ORDER — ACETAMINOPHEN 500 MG PO TABS
1000.0000 mg | ORAL_TABLET | Freq: Four times a day (QID) | ORAL | Status: AC
  Administered 2017-10-02 – 2017-10-03 (×4): 1000 mg via ORAL
  Filled 2017-10-02 (×4): qty 2

## 2017-10-02 MED ORDER — SUGAMMADEX SODIUM 200 MG/2ML IV SOLN
INTRAVENOUS | Status: AC
  Filled 2017-10-02: qty 2

## 2017-10-02 MED ORDER — SUGAMMADEX SODIUM 200 MG/2ML IV SOLN
INTRAVENOUS | Status: DC | PRN
  Administered 2017-10-02: 200 mg via INTRAVENOUS

## 2017-10-02 MED ORDER — ASPIRIN EC 81 MG PO TBEC
81.0000 mg | DELAYED_RELEASE_TABLET | Freq: Every day | ORAL | Status: DC
  Administered 2017-10-03: 81 mg via ORAL
  Filled 2017-10-02: qty 1

## 2017-10-02 MED ORDER — POLYETHYLENE GLYCOL 3350 17 G PO PACK: 17.0000 g | PACK | Freq: Every day | ORAL | Status: DC | PRN

## 2017-10-02 MED ORDER — OXYCODONE HCL 5 MG PO TABS
5.0000 mg | ORAL_TABLET | ORAL | Status: DC | PRN
  Administered 2017-10-02: 10 mg via ORAL

## 2017-10-02 MED ORDER — ONDANSETRON HCL 4 MG/2ML IJ SOLN
INTRAMUSCULAR | Status: AC
  Filled 2017-10-02: qty 2

## 2017-10-02 MED ORDER — DOCUSATE SODIUM 100 MG PO CAPS
100.0000 mg | ORAL_CAPSULE | Freq: Two times a day (BID) | ORAL | Status: DC
  Administered 2017-10-02 – 2017-10-03 (×2): 100 mg via ORAL
  Filled 2017-10-02 (×2): qty 1

## 2017-10-02 MED ORDER — ONDANSETRON HCL 4 MG PO TABS: 4.0000 mg | ORAL_TABLET | Freq: Four times a day (QID) | ORAL | Status: DC | PRN

## 2017-10-02 MED ORDER — FENTANYL CITRATE (PF) 100 MCG/2ML IJ SOLN
INTRAMUSCULAR | Status: AC
  Administered 2017-10-02: 50 ug via INTRAVENOUS
  Filled 2017-10-02: qty 2

## 2017-10-02 MED ORDER — LIDOCAINE 2% (20 MG/ML) 5 ML SYRINGE
INTRAMUSCULAR | Status: DC | PRN
  Administered 2017-10-02: 80 mg via INTRAVENOUS

## 2017-10-02 MED ORDER — CEFAZOLIN SODIUM-DEXTROSE 2-4 GM/100ML-% IV SOLN
2.0000 g | INTRAVENOUS | Status: AC
  Administered 2017-10-02: 2 g via INTRAVENOUS
  Filled 2017-10-02: qty 100

## 2017-10-02 MED ORDER — DEXAMETHASONE SODIUM PHOSPHATE 10 MG/ML IJ SOLN
INTRAMUSCULAR | Status: AC
  Filled 2017-10-02: qty 1

## 2017-10-02 MED ORDER — 0.9 % SODIUM CHLORIDE (POUR BTL) OPTIME
TOPICAL | Status: DC | PRN
  Administered 2017-10-02: 1000 mL

## 2017-10-02 MED ORDER — METOCLOPRAMIDE HCL 5 MG/ML IJ SOLN: 5.0000 mg | Freq: Three times a day (TID) | INTRAMUSCULAR | Status: DC | PRN

## 2017-10-02 MED ORDER — LATANOPROST 0.005 % OP SOLN
1.0000 [drp] | Freq: Every day | OPHTHALMIC | Status: DC
  Administered 2017-10-02: 1 [drp] via OPHTHALMIC
  Filled 2017-10-02: qty 2.5

## 2017-10-02 MED ORDER — PHENOL 1.4 % MT LIQD: 1.0000 | OROMUCOSAL | Status: DC | PRN

## 2017-10-02 MED ORDER — TRANEXAMIC ACID 1000 MG/10ML IV SOLN
1000.0000 mg | Freq: Once | INTRAVENOUS | Status: AC
  Administered 2017-10-02: 1000 mg via INTRAVENOUS
  Filled 2017-10-02: qty 10

## 2017-10-02 SURGICAL SUPPLY — 52 items
BAG DECANTER FOR FLEXI CONT (MISCELLANEOUS) ×3 IMPLANT
BLADE SAW SGTL 18X1.27X75 (BLADE) ×2 IMPLANT
BLADE SAW SGTL 18X1.27X75MM (BLADE) ×1
CAPT HIP TOTAL 2 ×3 IMPLANT
COVER PERINEAL POST (MISCELLANEOUS) ×3 IMPLANT
COVER SURGICAL LIGHT HANDLE (MISCELLANEOUS) IMPLANT
DRAPE C-ARM 42X72 X-RAY (DRAPES) ×3 IMPLANT
DRAPE STERI IOBAN 125X83 (DRAPES) ×3 IMPLANT
DRAPE U-SHAPE 47X51 STRL (DRAPES) ×6 IMPLANT
DRSG AQUACEL AG ADV 3.5X10 (GAUZE/BANDAGES/DRESSINGS) ×3 IMPLANT
DURAPREP 26ML APPLICATOR (WOUND CARE) ×3 IMPLANT
ELECT BLADE 4.0 EZ CLEAN MEGAD (MISCELLANEOUS) ×3
ELECT REM PT RETURN 9FT ADLT (ELECTROSURGICAL) ×3
ELECTRODE BLDE 4.0 EZ CLN MEGD (MISCELLANEOUS) ×1 IMPLANT
ELECTRODE REM PT RTRN 9FT ADLT (ELECTROSURGICAL) ×1 IMPLANT
FACESHIELD WRAPAROUND (MASK) ×6 IMPLANT
GLOVE BIO SURGEON STRL SZ7.5 (GLOVE) ×3 IMPLANT
GLOVE BIO SURGEON STRL SZ8.5 (GLOVE) ×3 IMPLANT
GLOVE BIOGEL PI IND STRL 7.0 (GLOVE) ×1 IMPLANT
GLOVE BIOGEL PI IND STRL 7.5 (GLOVE) ×1 IMPLANT
GLOVE BIOGEL PI IND STRL 8 (GLOVE) ×1 IMPLANT
GLOVE BIOGEL PI IND STRL 9 (GLOVE) ×1 IMPLANT
GLOVE BIOGEL PI INDICATOR 7.0 (GLOVE) ×2
GLOVE BIOGEL PI INDICATOR 7.5 (GLOVE) ×2
GLOVE BIOGEL PI INDICATOR 8 (GLOVE) ×2
GLOVE BIOGEL PI INDICATOR 9 (GLOVE) ×2
GLOVE SURG SS PI 6.5 STRL IVOR (GLOVE) ×9 IMPLANT
GLOVE SURG SS PI 7.0 STRL IVOR (GLOVE) ×6 IMPLANT
GOWN STRL REUS W/ TWL LRG LVL3 (GOWN DISPOSABLE) ×2 IMPLANT
GOWN STRL REUS W/ TWL XL LVL3 (GOWN DISPOSABLE) ×2 IMPLANT
GOWN STRL REUS W/TWL LRG LVL3 (GOWN DISPOSABLE) ×4
GOWN STRL REUS W/TWL XL LVL3 (GOWN DISPOSABLE) ×4
KIT BASIN OR (CUSTOM PROCEDURE TRAY) ×3 IMPLANT
KIT TURNOVER KIT B (KITS) ×3 IMPLANT
MANIFOLD NEPTUNE II (INSTRUMENTS) IMPLANT
NEEDLE HYPO 22GX1.5 SAFETY (NEEDLE) ×6 IMPLANT
NS IRRIG 1000ML POUR BTL (IV SOLUTION) ×3 IMPLANT
PACK TOTAL JOINT (CUSTOM PROCEDURE TRAY) ×3 IMPLANT
PAD ARMBOARD 7.5X6 YLW CONV (MISCELLANEOUS) ×6 IMPLANT
SUT ETHIBOND NAB CT1 #1 30IN (SUTURE) ×3 IMPLANT
SUT VIC AB 0 CT1 27 (SUTURE) ×2
SUT VIC AB 0 CT1 27XBRD ANBCTR (SUTURE) ×1 IMPLANT
SUT VIC AB 1 CTX 36 (SUTURE) ×2
SUT VIC AB 1 CTX36XBRD ANBCTR (SUTURE) ×1 IMPLANT
SUT VIC AB 2-0 CT1 27 (SUTURE) ×2
SUT VIC AB 2-0 CT1 TAPERPNT 27 (SUTURE) ×1 IMPLANT
SUT VIC AB 3-0 CT1 27 (SUTURE) ×2
SUT VIC AB 3-0 CT1 TAPERPNT 27 (SUTURE) ×1 IMPLANT
SYR CONTROL 10ML LL (SYRINGE) ×6 IMPLANT
TOWEL OR 17X26 10 PK STRL BLUE (TOWEL DISPOSABLE) ×3 IMPLANT
TRAY CATH 16FR W/PLASTIC CATH (SET/KITS/TRAYS/PACK) IMPLANT
YANKAUER SUCT BULB TIP NO VENT (SUCTIONS) ×3 IMPLANT

## 2017-10-02 NOTE — Anesthesia Procedure Notes (Signed)
Procedure Name: Intubation Date/Time: 10/02/2017 11:09 AM Performed by: White, Amedeo Plenty, CRNA Pre-anesthesia Checklist: Patient identified, Emergency Drugs available, Suction available and Patient being monitored Patient Re-evaluated:Patient Re-evaluated prior to induction Oxygen Delivery Method: Circle System Utilized Preoxygenation: Pre-oxygenation with 100% oxygen Induction Type: IV induction Laryngoscope Size: Mac and 4 Grade View: Grade I Tube type: Oral Tube size: 7.5 mm Number of attempts: 1 Airway Equipment and Method: Stylet Placement Confirmation: ETT inserted through vocal cords under direct vision,  positive ETCO2 and breath sounds checked- equal and bilateral Secured at: 23 cm Tube secured with: Tape Dental Injury: Teeth and Oropharynx as per pre-operative assessment

## 2017-10-02 NOTE — Interval H&P Note (Signed)
History and Physical Interval Note:  10/02/2017 12:46 PM  Jared [name]  has presented today for surgery, with the diagnosis of LEFT HIP OSTEOARTHRITIS  The various methods of treatment have been discussed with the patient and family. After consideration of risks, benefits and other options for treatment, the patient has consented to  Procedure(s): LEFT TOTAL HIP ARTHROPLASTY ANTERIOR APPROACH (Left) as a surgical intervention .  The patient's history has been reviewed, patient examined, no change in status, stable for surgery.  I have reviewed the patient's chart and labs.  Questions were answered to the patient's satisfaction.     Nestor Lewandowsky

## 2017-10-02 NOTE — Transfer of Care (Signed)
Immediate Anesthesia Transfer of Care Note  Patient: Jared [name]  Procedure(s) Performed: LEFT TOTAL HIP ARTHROPLASTY ANTERIOR APPROACH (Left Hip)  Patient Location: PACU  Anesthesia Type:General  Level of Consciousness: awake, alert , oriented and patient cooperative  Airway & Oxygen Therapy: Patient Spontanous Breathing  Post-op Assessment: Report given to RN and Post -op Vital signs reviewed and stable  Post vital signs: Reviewed and stable  Last Vitals:  Vitals Value Taken Time  BP 133/110 10/02/2017  2:51 PM  Temp    Pulse 67 10/02/2017  2:52 PM  Resp 17 10/02/2017  2:52 PM  SpO2 99 % 10/02/2017  2:52 PM  Vitals shown include unvalidated device data.  Last Pain:  Vitals:   10/02/17 1117  TempSrc:   PainSc: 0-No pain         Complications: No apparent anesthesia complications

## 2017-10-02 NOTE — Progress Notes (Signed)
Orthopedic Tech Progress Note Patient Details:  KAYSON TASKER 16-Feb-1946 [id number]  Patient ID: [name], male   DOB: 1945-08-24, 72 y.o.   MRN: [id number] Pt cant have ohf due to age restrictions.  Trinna Post 10/02/2017, 11:22 PM

## 2017-10-02 NOTE — Discharge Instructions (Signed)

## 2017-10-02 NOTE — Progress Notes (Signed)
To [employer] as caregiver

## 2017-10-02 NOTE — Evaluation (Addendum)
Physical Therapy Evaluation Patient Details Name: Jared [name] MRN: [id number] DOB: 28-May-1945 Today's Date: 10/02/2017   History of Present Illness  72 y.o. male s/p L THA 10/02/17. PMH includes: HTN, BL TKA, Shoulder and Back surgery.   Clinical Impression  Patient is s/p above surgery resulting in functional limitations due to the deficits listed below (see PT Problem List). PTA, pt living with wife in home with 4 stairs to enter, independent with all mobility and working part time. Upon eval pt presents with post op pain and weakness. Min guard level for all mobility including short distance ambulation. Next PT visit will progress ambulation, standing therex, and focus on stair training before safe return home when medically appropriate. Anticipate patient will do well.   Patient will benefit from skilled PT to increase their independence and safety with mobility to allow discharge to the venue listed below.       Follow Up Recommendations Follow surgeon's recommendation for DC plan and follow-up therapies;Home health PT    Equipment Recommendations  None recommended by PT    Recommendations for Other Services       Precautions / Restrictions Precautions Precautions: Anterior Hip;Fall Precaution Booklet Issued: Yes (comment) Precaution Comments: per order, ant hip. reviewed verablly and handout Restrictions Weight Bearing Restrictions: Yes      Mobility  Bed Mobility Overal bed mobility: Needs Assistance Bed Mobility: Supine to Sit;Sit to Supine     Supine to sit: Supervision        Transfers Overall transfer level: Needs assistance Equipment used: Rolling walker (2 wheeled) Transfers: Sit to/from Stand Sit to Stand: Min guard         General transfer comment: cues for hand placement, able to power up with min guard   Ambulation/Gait Ambulation/Gait assistance: Min guard Ambulation Distance (Feet): 40 Feet Assistive device: Rolling walker (2 wheeled) Gait  Pattern/deviations: Step-to pattern Gait velocity: decreased   General Gait Details: decreased step length due to pain, RW for safety. min guard at this time.   Stairs            Wheelchair Mobility    Modified Rankin (Stroke Patients Only)       Balance Overall balance assessment: Needs assistance   Sitting balance-Leahy Scale: Good       Standing balance-Leahy Scale: Fair                               Pertinent Vitals/Pain Pain Assessment: 0-10 Pain Score: 4  Pain Location: L hip Pain Descriptors / Indicators: Aching;Operative site guarding Pain Intervention(s): Limited activity within patient's tolerance;Monitored during session;Premedicated before session;Repositioned    Home Living Family/patient expects to be discharged to:: Private residence Living Arrangements: Spouse/significant other Available Help at Discharge: Family Type of Home: House Home Access: Stairs to enter Entrance Stairs-Rails: Doctor, general practice of Steps: 4 Home Layout: One level Home Equipment: Environmental consultant - 2 wheels;Cane - single point;Shower seat      Prior Function Level of Independence: Independent         Comments: working part time, driving, no AD.      Hand Dominance        Extremity/Trunk Assessment   Upper Extremity Assessment Upper Extremity Assessment: Overall WFL for tasks assessed    Lower Extremity Assessment Lower Extremity Assessment: Overall WFL for tasks assessed(L hip weakness consistent with above procedure)       Communication   Communication: No difficulties  Cognition Arousal/Alertness: Awake/alert Behavior During Therapy: WFL for tasks assessed/performed Overall Cognitive Status: Within Functional Limits for tasks assessed                                        General Comments      Exercises Total Joint Exercises Ankle Circles/Pumps: 20 reps Quad Sets: 10 reps Heel Slides: 10 reps    Assessment/Plan    PT Assessment Patient needs continued PT services  PT Problem List Decreased strength;Decreased range of motion;Decreased activity tolerance;Decreased balance;Decreased mobility;Pain       PT Treatment Interventions DME instruction;Stair training;Gait training;Functional mobility training;Therapeutic activities;Therapeutic exercise    PT Goals (Current goals can be found in the Care Plan section)  Acute Rehab PT Goals Patient Stated Goal: return home tomorrow PT Goal Formulation: With patient Time For Goal Achievement: 10/09/17 Potential to Achieve Goals: Good    Frequency 7X/week   Barriers to discharge        Co-evaluation               AM-PAC PT "6 Clicks" Daily Activity  Outcome Measure Difficulty turning over in bed (including adjusting bedclothes, sheets and blankets)?: A Little Difficulty moving from lying on back to sitting on the side of the bed? : A Little Difficulty sitting down on and standing up from a chair with arms (e.g., wheelchair, bedside commode, etc,.)?: A Little Help needed moving to and from a bed to chair (including a wheelchair)?: A Little Help needed walking in hospital room?: A Little Help needed climbing 3-5 steps with a railing? : A Little 6 Click Score: 18    End of Session Equipment Utilized During Treatment: Gait belt Activity Tolerance: Patient tolerated treatment well Patient left: in bed;with call bell/phone within reach;with family/visitor present Nurse Communication: Mobility status PT Visit Diagnosis: Unsteadiness on feet (R26.81);Pain Pain - Right/Left: Left Pain - part of body: Hip    Time: 1810-1840 PT Time Calculation (min) (ACUTE ONLY): 30 min   Charges:   PT Evaluation $PT Eval Low Complexity: 1 Low PT Treatments $Gait Training: 8-22 mins   PT G Codes:       Etta Grandchild, PT, DPT Acute Rehab Services Pager: [phone]    Etta Grandchild 10/02/2017, 10:11 PM

## 2017-10-02 NOTE — Op Note (Signed)
OPERATIVE REPORT    DATE OF PROCEDURE:  10/02/2017       PREOPERATIVE DIAGNOSIS:  LEFT HIP OSTEOARTHRITIS                                                          POSTOPERATIVE DIAGNOSIS:  LEFT HIP OSTEOARTHRITIS                                                           PROCEDURE: Anterior L total hip arthroplasty using a 54 mm DePuy Pinnacle  Cup, [employer], 0-degree polyethylene liner, a +9 36 mm ceramic head, a 4 hi Depuy Triloc stem   SURGEON: Nestor Lewandowsky    ASSISTANT:   Tomi Likens. [employer]  (present throughout entire procedure and necessary for timely completion of the procedure)   ANESTHESIA: Spinal BLOOD LOSS: 300cc FLUID REPLACEMENT: 1500 crystalloid Antibiotic: 2gm ancef Tranexamic Acid: 1gm IV, 2gm Topical COMPLICATIONS: none    INDICATIONS FOR PROCEDURE: A 72 y.o. year-old With  LEFT HIP OSTEOARTHRITIS   for 3 years, x-rays show bone-on-bone arthritic changes, and osteophytes. Despite conservative measures with observation, anti-inflammatory medicine, narcotics, use of a cane, has severe unremitting pain and can ambulate only a few blocks before resting. Patient desires elective L total hip arthroplasty to decrease pain and increase function. The risks, benefits, and alternatives were discussed at length including but not limited to the risks of infection, bleeding, nerve injury, stiffness, blood clots, the need for revision surgery, cardiopulmonary complications, among others, and they were willing to proceed. Questions answered     PROCEDURE IN DETAIL: The patient was identified by armband,  received preoperative IV antibiotics in the holding area at [facility], taken to the operating room , appropriate anesthetic monitors  were attached and  anesthesia was induced with the patienton the gurney. The HANA boots were applied to the feet and he was then transferred to the HANA table with a peroneal post and support underneath the non-operative le,  which was locked in 5 lb traction. Theoperative lower extremity was then prepped and draped in the usual sterile fashion from just above the iliac crest to the knee. And a timeout procedure was performed. We then made a 10 cm incision along the interval at the leading edge of the tensor fascia lata of starting at 2 cm lateral to and 2 cm distal to the ASIS. Small bleeders in the skin and subcutaneous tissue identified and cauterized we dissected down to the fascia and made an incision in the fascia allowing [place] to elevate the fascia of the tensor muscle and exploited the interval between the rectus and the tensor fascia lata. A Hohmann retractor was then placed along the superior neck of the femur and a Cobra retractor along the inferior neck of the femur we teed the capsule starting out at the superior anterior aspect of the acetabulum going distally and made the T along the neck both leaflets of the T were tagged with #2 Ethibond suture. Cobra retractors were then placed along the inferior and superior neck allowing [place] to perform a standard neck cut  and removed the femoral head with a power corkscrew. We then placed a right angle Hohmann retractor along the anterior aspect of the acetabulum a spiked Cobra in the cotyloid notch and posteriorly a Muelller retractor. We then sequentially reamed up to a 53 mm basket reamer obtaining good coverage in all quadrants, verified by C-arm imaging. Under C-arm control with and hammered into place a 54 mm Pinnacle cup in 45 of abduction and 15 of anteversion. The cup seated nicely and required no supplemental screws. We then placed a central hole Eliminator and a 0 polyethylene liner. The foot was then externally rotated to 110, the HANA elevator was placed around the flare of the greater trochanter and the limb was extended and abducted delivering the proximal femur up into the wound. A medium Hohmann retractor was placed over the greater trochanter and a Mueller retractor  along the posterior femoral neck completing the exposure. We then performed releases superiorly and and inferiorly of the capsule going back to the pirformis fossa superiorly and to the lesser trochanter inferiorly. We then entered the proximal femur with the box cutting offset chisel followed by, a canal sounder, the chili pepper and broaching up to a 4 broach. This seated nicely and we reamed the calcar. A trial reduction was performed with a 1.5 mm 36 mm head.The limb lengths were excellent the hip was stable in 90 of external rotation. At this point the trial components removed and we hammered into place a # 4 hi Offset Tri-Lock stem with G[employer] coating. A + 1.5x36 mm ceramic ball was then hammered into place the hip was reduced and final C-arm images obtained. The wound was thoroughly irrigated with normal saline solution. We repaired the ant capsule and the tensor fascia lot a with running 0 vicryl suture. the subcutaneous tissue was closed with 2-0 and 3-0 Vicryl suture followed by an Aquacil dressing. At this point the patient was awaken and transferred to hospital gurney without difficulty. The subcutaneous tissue with 0 and 2-0 undyed Vicryl suture and the skin with running  3-0 vicryl subcuticular suture. Aquacil dressing was applied. The patient was then unclamped, rolled supine, awaken extubated and taken to recovery room without difficulty in stable condition.   Nestor Lewandowsky 10/02/2017, 2:14 PM

## 2017-10-02 NOTE — Anesthesia Preprocedure Evaluation (Addendum)
Anesthesia Evaluation  Patient identified by MRN, date of birth, ID band Patient awake    Reviewed: Allergy & Precautions, H&P , NPO status , Patient's Chart, lab work & pertinent test results  Airway Mallampati: II  TM Distance: >3 FB Neck ROM: Full    Dental no notable dental hx. (+) Upper Dentures, Partial Lower, Dental Advisory Given   Pulmonary neg pulmonary ROS, former smoker,    Pulmonary exam normal breath sounds clear to auscultation       Cardiovascular hypertension, Pt. on medications  Rhythm:Regular Rate:Normal     Neuro/Psych  Headaches, negative psych ROS   GI/Hepatic negative GI ROS, Neg liver ROS,   Endo/Other  negative endocrine ROS  Renal/GU negative Renal ROS  negative genitourinary   Musculoskeletal  (+) Arthritis , Osteoarthritis,    Abdominal   Peds  Hematology negative hematology ROS (+)   Anesthesia Other Findings   Reproductive/Obstetrics negative OB ROS                            Anesthesia Physical Anesthesia Plan  ASA: II  Anesthesia Plan: General   Post-op Pain Management:    Induction: Intravenous  PONV Risk Score and Plan: 2 and Ondansetron and Dexamethasone  Airway Management Planned: Oral ETT  Additional Equipment:   Intra-op Plan:   Post-operative Plan: Extubation in OR  Informed Consent: I have reviewed the patients History and Physical, chart, labs and discussed the procedure including the risks, benefits and alternatives for the proposed anesthesia with the patient or authorized representative who has indicated his/her understanding and acceptance.   Dental advisory given  Plan Discussed with: CRNA  Anesthesia Plan Comments:        Anesthesia Quick Evaluation

## 2017-10-03 ENCOUNTER — Encounter (HOSPITAL_COMMUNITY): Payer: Self-pay | Admitting: Orthopedic Surgery

## 2017-10-03 LAB — CBC
HCT: 38.8 % — ABNORMAL LOW (ref 39.0–52.0)
Hemoglobin: 12.8 g/dL — ABNORMAL LOW (ref 13.0–17.0)
MCH: 30.4 pg (ref 26.0–34.0)
MCHC: 33 g/dL (ref 30.0–36.0)
MCV: 92.2 fL (ref 78.0–100.0)
PLATELETS: 167 10*3/uL (ref 150–400)
RBC: 4.21 MIL/uL — ABNORMAL LOW (ref 4.22–5.81)
RDW: 13.1 % (ref 11.5–15.5)
WBC: 9.5 10*3/uL (ref 4.0–10.5)

## 2017-10-03 LAB — BASIC METABOLIC PANEL
Anion gap: 9 (ref 5–15)
BUN: 14 mg/dL (ref 6–20)
CHLORIDE: 102 mmol/L (ref 101–111)
CO2: 25 mmol/L (ref 22–32)
CREATININE: 1.34 mg/dL — AB (ref 0.61–1.24)
Calcium: 8.7 mg/dL — ABNORMAL LOW (ref 8.9–10.3)
GFR calc Af Amer: 59 mL/min — ABNORMAL LOW (ref >60)
GFR calc non Af Amer: 51 mL/min — ABNORMAL LOW (ref >60)
Glucose, Bld: 163 mg/dL — ABNORMAL HIGH (ref 65–99)
Potassium: 4 mmol/L (ref 3.5–5.1)
SODIUM: 136 mmol/L (ref 135–145)

## 2017-10-03 LAB — GLUCOSE, CAPILLARY: GLUCOSE-CAPILLARY: 124 mg/dL — AB (ref 65–99)

## 2017-10-03 NOTE — Progress Notes (Addendum)
Physical Therapy Treatment Patient Details Name: Jared [name] MRN: [id number] DOB: [birth year] Today's Date: 10/03/2017    History of Present Illness 72 y.o. male s/p L THA 10/02/17. PMH includes: HTN, BL TKA, Shoulder and Back surgery.     PT Comments    Pt performed gait training and stair training before d/c home.  Pt tolerated session well.  Extensive review for anterior hip precautions, PTA issued handout for carryover at home.  Informed supervising PT that patient would benefit from OT consult before d/c.  PT to place order.  Pt is safe to d/c from a mobility stand point after OT evaluations.      Follow Up Recommendations  Follow surgeon's recommendation for DC plan and follow-up therapies;Home health PT     Equipment Recommendations  None recommended by PT    Recommendations for Other Services       Precautions / Restrictions Precautions Precautions: Anterior Hip;Fall Precaution Booklet Issued: Yes (comment) Precaution Comments: per order, ant hip. reviewed verablly  Restrictions Weight Bearing Restrictions: Yes(L WBAT)    Mobility  Bed Mobility               General bed mobility comments: Pt sitting edge of bed on arrival.    Transfers Overall transfer level: Needs assistance Equipment used: Rolling walker (2 wheeled) Transfers: Sit to/from Stand Sit to Stand: Min guard         General transfer comment: cues for hand placement, able to power up with min guard   Ambulation/Gait Ambulation/Gait assistance: Min guard Ambulation Distance (Feet): 400 Feet Assistive device: Rolling walker (2 wheeled) Gait Pattern/deviations: Step-through pattern;Trunk flexed Gait velocity: decreased   General Gait Details: Cues for upper trunk control and safety with turns to the R to avoid ER of LLE.     Stairs Stairs: Yes Stairs assistance: Supervision Stair Management: One rail Right;Step to pattern Number of Stairs: 4 General stair comments: Cues for  sequencing and hand placement on railing.     Wheelchair Mobility    Modified Rankin (Stroke Patients Only)       Balance Overall balance assessment: Mild deficits observed, not formally tested                                          Cognition Arousal/Alertness: Awake/alert Behavior During Therapy: WFL for tasks assessed/performed Overall Cognitive Status: Within Functional Limits for tasks assessed                                        Exercises Total Joint Exercises Ankle Circles/Pumps: AROM;Both;20 reps;Supine Quad Sets: AROM;Left;10 reps;Supine Towel Squeeze: AROM;Both;10 reps;Supine Short Arc Quad: AROM;Left;10 reps;Supine Heel Slides: AROM;Left;10 reps;Supine Long Arc Quad: AROM;Left;10 reps;Supine Knee Flexion: AROM;Left;10 reps;Supine Marching in Standing: AROM;Left;10 reps;Supine    General Comments        Pertinent Vitals/Pain Pain Assessment: No/denies pain    Home Living Family/patient expects to be discharged to:: Private residence Living Arrangements: Spouse/significant other Available Help at Discharge: Family Type of Home: House Home Access: Stairs to enter Entrance Stairs-Rails: Right;Left Home Layout: One level Home Equipment: Environmental consultant - 2 wheels;Cane - single point;Bedside commode      Prior Function Level of Independence: Independent      Comments: working part time, driving, no AD.    PT  Goals (current goals can now be found in the care plan section) Acute Rehab PT Goals Patient Stated Goal: home today Potential to Achieve Goals: Good Progress towards PT goals: Progressing toward goals    Frequency    7X/week      PT Plan Current plan remains appropriate    Co-evaluation              AM-PAC PT "6 Clicks" Daily Activity  Outcome Measure  Difficulty turning over in bed (including adjusting bedclothes, sheets and blankets)?: None Difficulty moving from lying on back to sitting on the  side of the bed? : None Difficulty sitting down on and standing up from a chair with arms (e.g., wheelchair, bedside commode, etc,.)?: A Little Help needed moving to and from a bed to chair (including a wheelchair)?: A Little Help needed walking in hospital room?: A Little Help needed climbing 3-5 steps with a railing? : A Little 6 Click Score: 20    End of Session Equipment Utilized During Treatment: Gait belt Activity Tolerance: Patient tolerated treatment well Patient left: in bed;with call bell/phone within reach;with family/visitor present Nurse Communication: Mobility status PT Visit Diagnosis: Unsteadiness on feet (R26.81);Pain Pain - Right/Left: Left Pain - part of body: Hip     Time: 1610-9604 PT Time Calculation (min) (ACUTE ONLY): 26 min  Charges:  $Gait Training: 8-22 mins $Therapeutic Exercise: 8-22 mins                    G Codes:       [name], PTA pager [phone]    Florestine Avers 10/03/2017, 5:24 PM

## 2017-10-03 NOTE — Progress Notes (Signed)
Discharge instructions completed with patient and his wife.  They verbalized understanding of the information.  Pt alert and oriented x4.  Pt denies chest pain, shortness of breath, dizziness, lightheadedness, and n/v.  Pt discharged home.

## 2017-10-03 NOTE — Evaluation (Signed)
Occupational Therapy Evaluation and Discharge Patient Details Name: Jared [name] MRN: [id number] DOB: 01-Jun-1945 Today's Date: 10/03/2017    History of Present Illness 72 y.o. male s/p L THA 10/02/17. PMH includes: HTN, BL TKA, Shoulder and Back surgery.    Clinical Impression   This 72 yo male admitted and underwent above presents to acute OT with all education completed with pt and wife on B/D/toileting transfers/shower stall transfers to 3n1. They do not have any further basic ADL questions, we will D/C from acute OT.    Follow Up Recommendations  No OT follow up;Supervision - Intermittent    Equipment Recommendations  None recommended by OT       Precautions / Restrictions Precautions Precautions: Anterior Hip;Fall Precaution Comments: per order, ant hip. reviewed verablly  Restrictions Weight Bearing Restrictions: Yes             ADL either performed or assessed with clinical judgement   ADL                                         General ADL Comments: Educated pt and wife on most efficient sequence of getting dressed and shower stall transfers to 3n1. Pt is able to get socks off and on while seated in recliner by bending forward. Wife is there to A prn. Pt is familiar with transfers to/from recliner which is same as to/from 3n1.     Vision Patient Visual Report: No change from baseline              Pertinent Vitals/Pain Pain Assessment: No/denies pain     Hand Dominance Right   Extremity/Trunk Assessment Upper Extremity Assessment Upper Extremity Assessment: Overall WFL for tasks assessed           Communication Communication Communication: No difficulties   Cognition Arousal/Alertness: Awake/alert Behavior During Therapy: WFL for tasks assessed/performed Overall Cognitive Status: Within Functional Limits for tasks assessed                                                Home Living Family/patient expects to  be discharged to:: Private residence Living Arrangements: Spouse/significant other Available Help at Discharge: Family Type of Home: House Home Access: Stairs to enter Secretary/administrator of Steps: 4 Entrance Stairs-Rails: Right;Left Home Layout: One level     Bathroom Shower/Tub: Walk-in shower;Door   [employer] Toilet: Standard     Home Equipment: Environmental consultant - 2 wheels;Cane - single point;Bedside commode          Prior Functioning/Environment Level of Independence: Independent        Comments: working part time, driving, no AD.         OT Problem List: Decreased range of motion;Impaired balance (sitting and/or standing)         OT Goals(Current goals can be found in the care plan section) Acute Rehab OT Goals Patient Stated Goal: home today  OT Frequency:                AM-PAC PT "6 Clicks" Daily Activity     Outcome Measure Help from another person eating meals?: None Help from another person taking care of personal grooming?: A Little Help from another person toileting, which includes using toliet, bedpan, or urinal?: A Little  Help from another person bathing (including washing, rinsing, drying)?: A Little Help from another person to put on and taking off regular upper body clothing?: A Little Help from another person to put on and taking off regular lower body clothing?: A Little 6 Click Score: 19   End of Session Nurse Communication: (pt evaled by OT )  Activity Tolerance: Patient tolerated treatment well Patient left: in chair;with call bell/phone within reach;with family/visitor present  OT Visit Diagnosis: Other abnormalities of gait and mobility (R26.89)                Time: 1610-9604 OT Time Calculation (min): 13 min Charges:  OT General Charges $OT Visit: 1 Visit OT Evaluation $OT Eval Low Complexity: 1 Low Ignacia Palma, OTR/L [phone] 10/03/2017

## 2017-10-03 NOTE — Discharge Summary (Signed)
Patient ID: Jared [name] MRN: [id number] DOB/AGE: Feb 11, 1946 72 y.o.  Admit date: 10/02/2017 Discharge date: 10/03/2017  Admission Diagnoses:  Principal Problem:   Osteoarthritis of left hip Active Problems:   Primary osteoarthritis of left hip   Discharge Diagnoses:  Same  Past Medical History:  Diagnosis Date  . Arthritis    knees and back  . Enlarged prostate    takes Flomax  . Headache   . Hypertension    stress test done about 12 yrs ago - results wnl per pt.    Surgeries: Procedure(s): LEFT TOTAL HIP ARTHROPLASTY ANTERIOR APPROACH on 10/02/2017   Consultants:   Discharged Condition: Improved  Hospital Course: KRUZE ATCHLEY is an 72 y.o. male who was admitted 10/02/2017 for operative treatment ofOsteoarthritis of left hip. Patient has severe unremitting pain that affects sleep, daily activities, and work/hobbies. After pre-op clearance the patient was taken to the operating room on 10/02/2017 and underwent  Procedure(s): LEFT TOTAL HIP ARTHROPLASTY ANTERIOR APPROACH.    Patient was given perioperative antibiotics:  Anti-infectives (From admission, onward)   Start     Dose/Rate Route Frequency Ordered Stop   10/02/17 1100  ceFAZolin (ANCEF) IVPB 2g/100 mL premix     2 g 200 mL/hr over 30 Minutes Intravenous On call to O.R. 10/02/17 1057 10/02/17 1320       Patient was given sequential compression devices, early ambulation, and chemoprophylaxis to prevent DVT. Walked 40' on DOS.  Patient benefited maximally from hospital stay and there were no complications.    Recent vital signs:  Patient Vitals for the past 24 hrs:  BP Temp Temp src Pulse Resp SpO2 Weight  10/03/17 0448 (Abnormal) 137/93 (Abnormal) 97.4 F (36.3 C) Oral (Abnormal) 48 16 98 % no documentation  10/02/17 1956 (Abnormal) 151/71 no documentation no documentation (Abnormal) 49 16 96 % no documentation  10/02/17 1645 (Abnormal) 162/94 (Abnormal) 97.4 F (36.3 C) Oral (Abnormal) 50 no documentation  100 % no documentation  10/02/17 1628 no documentation (Abnormal) 97.2 F (36.2 C) no documentation 62 19 98 % no documentation  10/02/17 1615 (Abnormal) 150/76 no documentation no documentation (Abnormal) 45 16 96 % no documentation  10/02/17 1600 (Abnormal) 151/71 no documentation no documentation (Abnormal) 54 18 97 % no documentation  10/02/17 1545 (Abnormal) 149/76 no documentation no documentation (Abnormal) 52 18 95 % no documentation  10/02/17 1530 136/76 no documentation no documentation (Abnormal) 54 13 94 % no documentation  10/02/17 1515 129/78 no documentation no documentation (Abnormal) 58 19 95 % no documentation  10/02/17 1500 127/80 no documentation no documentation 62 (Abnormal) 21 95 % no documentation  10/02/17 1450 (Abnormal) 133/110 97.7 F (36.5 C) no documentation 64 17 100 % no documentation  10/02/17 1052 (Abnormal) 170/85 98.4 F (36.9 C) Oral (Abnormal) 50 20 100 % 215 lb (97.5 kg)     Recent laboratory studies:  Recent Labs    10/03/17 0352  WBC 9.5  HGB 12.8*  HCT 38.8*  PLT 167  NA 136  K 4.0  CL 102  CO2 25  BUN 14  CREATININE 1.34*  GLUCOSE 163*  CALCIUM 8.7*     Discharge Medications:   Allergies as of 10/03/2017   No Known Allergies     Medication List    Stop taking these medications   naproxen sodium 220 MG tablet Commonly known as:  ALEVE     Take these medications   amLODipine 10 MG tablet Commonly known as:  NORVASC Take 10 mg by  mouth daily.   aspirin EC 81 MG tablet Take 1 tablet (81 mg total) by mouth 2 (two) times daily.   brimonidine 0.2 % ophthalmic solution Commonly known as:  ALPHAGAN Place 1 drop into both eyes 2 (two) times daily.   dorzolamide-timolol 22.3-6.8 MG/ML ophthalmic solution Commonly known as:  COSOPT Place 1 drop into both eyes 2 (two) times daily.   latanoprost 0.005 % ophthalmic solution Commonly known as:  XALATAN Place 1 drop into both eyes at bedtime.   multivitamins ther. w/minerals  Tabs tablet Take 1 tablet by mouth daily.   oxyCODONE-acetaminophen 5-325 MG tablet Commonly known as:  PERCOCET/ROXICET Take 1 tablet by mouth every 4 (four) hours as needed for severe pain. What changed:  reasons to take this   tamsulosin 0.4 MG Caps capsule Commonly known as:  FLOMAX Take 0.4 mg by mouth daily after supper.   tiZANidine 2 MG tablet Commonly known as:  ZANAFLEX Take 1 tablet (2 mg total) by mouth every 6 (six) hours as needed.        Durable Medical Equipment  (From admission, onward)        Start     Ordered   10/02/17 1643  DME Walker rolling  Once    Question:  Patient needs a walker to treat with the following condition  Answer:  Status post total hip replacement, left   10/02/17 1642   10/02/17 1643  DME 3 n 1  Once     10/02/17 1642      Diagnostic Studies: Dg Chest 2 View  Result Date: 09/20/2017 CLINICAL DATA:  Preoperative chest radiograph prior to hip replacement. EXAM: CHEST - 2 VIEW COMPARISON:  04/13/2014 and prior radiographs FINDINGS: Heart size is UPPER limits of normal. There is no evidence of focal airspace disease, pulmonary edema, suspicious pulmonary nodule/mass, pleural effusion, or pneumothorax. No acute bony abnormalities are identified. IMPRESSION: No active cardiopulmonary disease. Electronically Signed   By: Harmon Pier M.D.   On: 09/20/2017 15:00   Dg C-arm 1-60 Min  Result Date: 10/02/2017 CLINICAL DATA:  Left hip arthroplasty EXAM: OPERATIVE LEFT HIP WITH PELVIS; DG C-ARM 61-120 MIN COMPARISON:  None. FLUOROSCOPY TIME:  Radiation Exposure Index (as provided by the fluoroscopic device): Not available If the device does not provide the exposure index: Fluoroscopy Time:  21 seconds Number of Acquired Images:  2 FINDINGS: Spot films show left hip replacement in satisfactory position. No acute soft tissue or bony abnormality is noted. IMPRESSION: Left hip replacement Electronically Signed   By: Alcide Clever M.D.   On: 10/02/2017 15:56    Dg Hip Operative Unilat W Or W/o Pelvis Left  Result Date: 10/02/2017 CLINICAL DATA:  Left hip arthroplasty EXAM: OPERATIVE LEFT HIP WITH PELVIS; DG C-ARM 61-120 MIN COMPARISON:  None. FLUOROSCOPY TIME:  Radiation Exposure Index (as provided by the fluoroscopic device): Not available If the device does not provide the exposure index: Fluoroscopy Time:  21 seconds Number of Acquired Images:  2 FINDINGS: Spot films show left hip replacement in satisfactory position. No acute soft tissue or bony abnormality is noted. IMPRESSION: Left hip replacement Electronically Signed   By: Alcide Clever M.D.   On: 10/02/2017 15:56    Disposition: Discharge disposition: 01-Home or Self Care       Discharge Instructions    Call MD / Call 911   Complete by:  As directed    If you experience chest pain or shortness of breath, CALL 911 and be transported  to the hospital emergency room.  If you develope a fever above 101 F, pus (white drainage) or increased drainage or redness at the wound, or calf pain, call your surgeon's office.   Constipation Prevention   Complete by:  As directed    Drink plenty of fluids.  Prune juice may be helpful.  You may use a stool softener, such as Colace (over the counter) 100 mg twice a day.  Use MiraLax (over the counter) for constipation as needed.   Diet - low sodium heart healthy   Complete by:  As directed    Follow the hip precautions as taught in Physical Therapy   Complete by:  As directed    Increase activity slowly as tolerated   Complete by:  As directed       Follow-up Information    Gean Birchwood, MD In 2 weeks.   Specialty:  Orthopedic Surgery Contact information: [address] [place] [address] [phone]            Signed: Nestor Lewandowsky 10/03/2017, 8:47 AM

## 2017-10-03 NOTE — Anesthesia Postprocedure Evaluation (Signed)
Anesthesia Post Note  Patient: Jared [name]  Procedure(s) Performed: LEFT TOTAL HIP ARTHROPLASTY ANTERIOR APPROACH (Left Hip)     Patient location during evaluation: PACU Anesthesia Type: General Level of consciousness: oriented and awake and alert Pain management: pain level controlled Vital Signs Assessment: post-procedure vital signs reviewed and stable Respiratory status: spontaneous breathing, respiratory function stable and patient connected to nasal cannula oxygen Cardiovascular status: blood pressure returned to baseline and stable Postop Assessment: no headache, no backache and no apparent nausea or vomiting Anesthetic complications: no    Last Vitals:  Vitals:   10/02/17 1956 10/03/17 0448  BP: (!) 151/71 (!) 137/93  Pulse: (!) 49 (!) 48  Resp: 16 16  Temp:  (!) 36.3 C  SpO2: 96% 98%    Last Pain:  Vitals:   10/03/17 1241  TempSrc:   PainSc: 0-No pain                 Dom Haverland,JAMES TERRILL

## 2017-10-03 NOTE — Progress Notes (Signed)
PATIENT ID: Jared [name]  MRN: [id number]  DOB/AGE:  01/27/1946 / 72 y.o.  1 Day Post-Op Procedure(s) (LRB): LEFT TOTAL HIP ARTHROPLASTY ANTERIOR APPROACH (Left)    PROGRESS NOTE Subjective: Patient is alert, oriented, no Nausea, no Vomiting, yes passing gas, . Taking PO well. Denies SOB, Chest or Calf Pain. Using Incentive Spirometer, PAS in place. Ambulate 40' yesterday. Walking in room without walker today Patient reports pain as  1/10  .    Objective: Vital signs in last 24 hours: Vitals:   10/02/17 1628 10/02/17 1645 10/02/17 1956 10/03/17 0448  BP:  (Abnormal) 162/94 (Abnormal) 151/71 (Abnormal) 137/93  Pulse: 62 (Abnormal) 50 (Abnormal) 49 (Abnormal) 48  Resp: Temp: (Abnormal) 97.2 F (36.2 C) (Abnormal) 97.4 F (36.3 C)  (Abnormal) 97.4 F (36.3 C)  TempSrc:  Oral  Oral  SpO2: 98% 100% 96% 98%  Weight:          Intake/Output from previous day: I/O last 3 completed shifts: In: 458.3 [I.V.:458.3] Out: 550 [Urine:300; Blood:250]   Intake/Output this shift: No intake/output data recorded.   LABORATORY DATA: Recent Labs    10/03/17 0352  WBC 9.5  HGB 12.8*  HCT 38.8*  PLT 167  NA 136  K 4.0  CL 102  CO2 25  BUN 14  CREATININE 1.34*  GLUCOSE 163*  CALCIUM 8.7*    Examination: Neurologically intact ABD soft Neurovascular intact Sensation intact distally Intact pulses distally Dorsiflexion/Plantar flexion intact Incision: dressing C/D/I No cellulitis present Compartment soft} XR AP&Lat of hip shows well placed\fixed THA  Assessment:   1 Day Post-Op Procedure(s) (LRB): LEFT TOTAL HIP ARTHROPLASTY ANTERIOR APPROACH (Left) ADDITIONAL DIAGNOSIS:  Expected Acute Blood Loss Anemia, Hypertension  Plan: PT/OT WBAT, THA  DVT Prophylaxis: SCDx72 hrs, ASA 325 mg BID x 2 weeks  DISCHARGE PLAN: Home, today  DISCHARGE NEEDS: HHPT, Walker and 3-in-1 comode seat

## 2018-03-27 DIAGNOSIS — H409 Unspecified glaucoma: Secondary | ICD-10-CM

## 2018-03-27 DIAGNOSIS — K5720 Diverticulitis of large intestine with perforation and abscess without bleeding: Secondary | ICD-10-CM | POA: Diagnosis not present

## 2018-03-27 DIAGNOSIS — R109 Unspecified abdominal pain: Secondary | ICD-10-CM

## 2018-03-27 DIAGNOSIS — I10 Essential (primary) hypertension: Secondary | ICD-10-CM

## 2018-03-27 DIAGNOSIS — N400 Enlarged prostate without lower urinary tract symptoms: Secondary | ICD-10-CM

## 2018-03-28 DIAGNOSIS — R109 Unspecified abdominal pain: Secondary | ICD-10-CM | POA: Diagnosis not present

## 2018-03-28 DIAGNOSIS — K5720 Diverticulitis of large intestine with perforation and abscess without bleeding: Secondary | ICD-10-CM | POA: Diagnosis not present

## 2018-03-28 DIAGNOSIS — I10 Essential (primary) hypertension: Secondary | ICD-10-CM | POA: Diagnosis not present

## 2018-03-28 DIAGNOSIS — H409 Unspecified glaucoma: Secondary | ICD-10-CM | POA: Diagnosis not present

## 2018-03-29 DIAGNOSIS — H409 Unspecified glaucoma: Secondary | ICD-10-CM | POA: Diagnosis not present

## 2018-03-29 DIAGNOSIS — R109 Unspecified abdominal pain: Secondary | ICD-10-CM | POA: Diagnosis not present

## 2018-03-29 DIAGNOSIS — I10 Essential (primary) hypertension: Secondary | ICD-10-CM | POA: Diagnosis not present

## 2018-03-29 DIAGNOSIS — K5720 Diverticulitis of large intestine with perforation and abscess without bleeding: Secondary | ICD-10-CM | POA: Diagnosis not present

## 2018-12-15 ENCOUNTER — Other Ambulatory Visit: Payer: Self-pay

## 2018-12-15 DIAGNOSIS — R0989 Other specified symptoms and signs involving the circulatory and respiratory systems: Secondary | ICD-10-CM

## 2018-12-17 ENCOUNTER — Ambulatory Visit (INDEPENDENT_AMBULATORY_CARE_PROVIDER_SITE_OTHER): Payer: Medicare Other | Admitting: Vascular Surgery

## 2018-12-17 ENCOUNTER — Other Ambulatory Visit: Payer: Self-pay

## 2018-12-17 ENCOUNTER — Ambulatory Visit (HOSPITAL_COMMUNITY)
Admission: RE | Admit: 2018-12-17 | Discharge: 2018-12-17 | Disposition: A | Discharge status: Home / Self Care | Payer: Medicare Other | Source: Ambulatory Visit | Attending: Family | Admitting: Family

## 2018-12-17 ENCOUNTER — Encounter: Payer: Self-pay | Admitting: Vascular Surgery

## 2018-12-17 VITALS — BP 129/85 | HR 59 | Temp 98.0°F | Resp 20 | Ht 71.0 in | Wt 211.0 lb

## 2018-12-17 DIAGNOSIS — I739 Peripheral vascular disease, unspecified: Secondary | ICD-10-CM

## 2018-12-17 DIAGNOSIS — R0989 Other specified symptoms and signs involving the circulatory and respiratory systems: Secondary | ICD-10-CM | POA: Insufficient documentation

## 2018-12-17 NOTE — Progress Notes (Signed)
REASON FOR CONSULT:    "Reduced pedal pulses per housecall nurse."  The consult is requested by Dr. Catha GosselinKevin Little.  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  However, he has normal ABIs normal Doppler signals and normal toe pressures.  He does not describe any significant claudication, rest pain, or history of nonhealing ulcers.  I do not think any further arterial work-up is indicated.  I reassured him that he has excellent arterial flow.  He is not a smoker.  I encouraged him to stay as active as possible.  I will see him back as needed.  Waverly Ferrarihristopher Kimmarie Pascale, MD, FACS Beeper [phone] Office: [phone]   HPI:   Jared [name] is a pleasant 73 y.o. male, who is referred with diminished pedal pulses.  I have reviewed the records from the referring office.  The patient was seen on 10/29/2018.  This was for annual physical exam.  The patient does have essential hypertension which has been under good control.  Patient does have a history of benign prostatic hypertrophy.  GFR is 59.  Creatinine 1.2.  LDL cholesterol is 108.  On my history the patient did injure the plantar aspect of his right foot about 6 years ago when he was shoveling and apparently damaged the plantar aspect of his foot.  He has had some persistent numbness in this area since that time.  He does not describe any symptoms consistent with claudication.  He denies any rest pain or history of nonhealing ulcers.  His risk factors for peripheral vascular disease include hypertension, hypercholesterolemia, and a remote history of tobacco use.  He quit 1976.  He denies any history of diabetes or family history of premature cardiovascular disease.  Past Medical History:  Diagnosis Date  . Arthritis    knees and back  . Enlarged prostate    takes Flomax  . Headache   . Hypertension    stress test done about 12 yrs ago - results wnl per pt.     Family History  Problem Relation Age of Onset  . Migraines Mother   . Cancer Sister   . Cancer Sister     SOCIAL HISTORY: Social History   Socioeconomic History  . Marital status: Married    Spouse name: Not on file  . Number of children: Not on file  . Years of education: Not on file  . Highest education level: Not on file  Occupational History  . Not on file  Social Needs  . Financial resource strain: Not on file  . Food insecurity    Worry: Not on file    Inability: Not on file  . Transportation needs    Medical: Not on file    Non-medical: Not on file  Tobacco Use  . Smoking status: Former Smoker    Packs/day: 1.00    Years: 10.00    Pack years: 10.00    Types: Cigarettes    Quit date: 09/07/1974    Years since quitting: 44.3  . Smokeless tobacco: Never Used  Substance and Sexual Activity  . Alcohol use: No    Comment: quit 1976 - beer  . Drug use: No  . Sexual activity: Not on file  Lifestyle  . Physical activity    Days per week: Not on file    Minutes per session: Not on file  . Stress: Not on file  Relationships  . Social connections  Talks on phone: Not on file    Gets together: Not on file    Attends religious service: Not on file    Active member of club or organization: Not on file    Attends meetings of clubs or organizations: Not on file    Relationship status: Not on file  . Intimate partner violence    Fear of current or ex partner: Not on file    Emotionally abused: Not on file    Physically abused: Not on file    Forced sexual activity: Not on file  Other Topics Concern  . Not on file  Social History Narrative  . Not on file    No Known Allergies  Current Outpatient Medications  Medication Sig Dispense Refill  . amLODipine (NORVASC) 10 MG tablet Take 10 mg by mouth daily.     . brimonidine (ALPHAGAN) 0.2 % ophthalmic solution Place 1 drop into both eyes 2 (two) times daily.     Marland Kitchen. BRIMONIDINE-DORZOLAMIDE OP Apply to eye.    .  dorzolamide-timolol (COSOPT) 22.3-6.8 MG/ML ophthalmic solution Place 1 drop into both eyes 2 (two) times daily.  11  . indomethacin (INDOCIN) 50 MG capsule Take by mouth.    . latanoprost (XALATAN) 0.005 % ophthalmic solution Place 1 drop into both eyes at bedtime.    . Multiple Vitamins-Minerals (MULTIVITAMINS THER. W/MINERALS) TABS Take 1 tablet by mouth daily.      . tamsulosin (FLOMAX) 0.4 MG CAPS capsule Take 0.4 mg by mouth daily after supper.     . timolol (TIMOPTIC) 0.5 % ophthalmic solution      No current facility-administered medications for this visit.     REVIEW OF SYSTEMS:  [X]  denotes positive finding, [ ]  denotes negative finding Cardiac  Comments:  Chest pain or chest pressure:    Shortness of breath upon exertion: x   Short of breath when lying flat:    Irregular heart rhythm:        Vascular    Pain in calf, thigh, or hip brought on by ambulation:    Pain in feet at night that wakes you up from your sleep:     Blood clot in your veins:    Leg swelling:         Pulmonary    Oxygen at home:    Productive cough:     Wheezing:         Neurologic    Sudden weakness in arms or legs:     Sudden numbness in arms or legs:     Sudden onset of difficulty speaking or slurred speech:    Temporary loss of vision in one eye:     Problems with dizziness:         Gastrointestinal    Blood in stool:     Vomited blood:         Genitourinary    Burning when urinating:     Blood in urine:        Psychiatric    Major depression:         Hematologic    Bleeding problems:    Problems with blood clotting too easily:        Skin    Rashes or ulcers:        Constitutional    Fever or chills:     PHYSICAL EXAM:   Vitals:   12/17/18 1032  BP: 129/85  Pulse: (!) 59  Resp: 20  Temp: 98 F (36.7  C)  SpO2: 100%  Weight: 211 lb (95.7 kg)  Height: 5\' 11"  (1.803 m)    GENERAL: The patient is a well-nourished male, in no acute distress. The vital signs are  documented above. CARDIAC: There is a regular rate and rhythm.  VASCULAR: I do not detect carotid bruits. On the right side he has a palpable femoral, popliteal, and posterior tibial pulse. On the left side he has a palpable femoral, popliteal, dorsalis pedis, and posterior tibial pulse. PULMONARY: There is good air exchange bilaterally without wheezing or rales. ABDOMEN: Soft and non-tender with normal pitched bowel sounds.  MUSCULOSKELETAL: There are no major deformities or cyanosis. NEUROLOGIC: No focal weakness or paresthesias are detected. SKIN: There are no ulcers or rashes noted. PSYCHIATRIC: The patient has a normal affect.  DATA:    ARTERIAL DOPPLER STUDY: I have independently interpreted his arterial Doppler study today.  On the right side there is a triphasic dorsalis pedis and posterior tibial signal.  ABI is 100%.  Toe pressures 116 mmHg.  On the left side there is a triphasic dorsalis pedis and posterior tibial signal.  ABI is 100%.  Toe pressure is 108 mmHg.

## 2019-10-05 ENCOUNTER — Other Ambulatory Visit (HOSPITAL_BASED_OUTPATIENT_CLINIC_OR_DEPARTMENT_OTHER): Payer: Self-pay | Admitting: Family Medicine

## 2019-10-05 DIAGNOSIS — M5416 Radiculopathy, lumbar region: Secondary | ICD-10-CM

## 2019-10-10 ENCOUNTER — Other Ambulatory Visit: Payer: Self-pay

## 2019-10-10 ENCOUNTER — Ambulatory Visit (HOSPITAL_BASED_OUTPATIENT_CLINIC_OR_DEPARTMENT_OTHER)
Admission: RE | Admit: 2019-10-10 | Discharge: 2019-10-10 | Disposition: A | Discharge status: Home / Self Care | Payer: Medicare Other | Source: Ambulatory Visit | Attending: Family Medicine | Admitting: Family Medicine

## 2019-10-10 DIAGNOSIS — M5416 Radiculopathy, lumbar region: Secondary | ICD-10-CM | POA: Insufficient documentation

## 2020-05-23 DIAGNOSIS — E7800 Pure hypercholesterolemia, unspecified: Secondary | ICD-10-CM | POA: Diagnosis not present

## 2020-05-23 DIAGNOSIS — I10 Essential (primary) hypertension: Secondary | ICD-10-CM | POA: Diagnosis not present

## 2020-05-23 DIAGNOSIS — M48062 Spinal stenosis, lumbar region with neurogenic claudication: Secondary | ICD-10-CM | POA: Diagnosis not present

## 2020-05-23 DIAGNOSIS — H409 Unspecified glaucoma: Secondary | ICD-10-CM | POA: Diagnosis not present

## 2020-06-03 DIAGNOSIS — Z86010 Personal history of colonic polyps: Secondary | ICD-10-CM | POA: Diagnosis not present

## 2020-06-03 DIAGNOSIS — R899 Unspecified abnormal finding in specimens from other organs, systems and tissues: Secondary | ICD-10-CM | POA: Diagnosis not present

## 2020-06-03 DIAGNOSIS — H409 Unspecified glaucoma: Secondary | ICD-10-CM | POA: Diagnosis not present

## 2020-06-03 DIAGNOSIS — R7301 Impaired fasting glucose: Secondary | ICD-10-CM | POA: Diagnosis not present

## 2020-06-12 DIAGNOSIS — J209 Acute bronchitis, unspecified: Secondary | ICD-10-CM | POA: Diagnosis not present

## 2020-06-12 DIAGNOSIS — J0100 Acute maxillary sinusitis, unspecified: Secondary | ICD-10-CM | POA: Diagnosis not present

## 2020-06-28 DIAGNOSIS — M48062 Spinal stenosis, lumbar region with neurogenic claudication: Secondary | ICD-10-CM | POA: Diagnosis not present

## 2020-07-20 DIAGNOSIS — I10 Essential (primary) hypertension: Secondary | ICD-10-CM | POA: Diagnosis not present

## 2020-07-20 DIAGNOSIS — E7800 Pure hypercholesterolemia, unspecified: Secondary | ICD-10-CM | POA: Diagnosis not present

## 2020-07-20 DIAGNOSIS — H409 Unspecified glaucoma: Secondary | ICD-10-CM | POA: Diagnosis not present

## 2020-08-08 DIAGNOSIS — H00021 Hordeolum internum right upper eyelid: Secondary | ICD-10-CM | POA: Diagnosis not present

## 2020-10-25 DIAGNOSIS — I10 Essential (primary) hypertension: Secondary | ICD-10-CM | POA: Diagnosis not present

## 2020-10-25 DIAGNOSIS — M48062 Spinal stenosis, lumbar region with neurogenic claudication: Secondary | ICD-10-CM | POA: Diagnosis not present

## 2020-10-25 DIAGNOSIS — M4180 Other forms of scoliosis, site unspecified: Secondary | ICD-10-CM | POA: Diagnosis not present

## 2020-10-26 IMAGING — MR MR LUMBAR SPINE W/O CM
4 of 5 series · 25 of 48 positions shown · non-contrast
Comparison: None.

CLINICAL DATA: Low back pain.  Pain radiates into the left hip.

EXAM:
MRI LUMBAR SPINE WITHOUT CONTRAST
TECHNIQUE: Multiplanar, multisequence MR imaging of the lumbar spine was
performed. No intravenous contrast was administered.

[Series 2: T1 · sagittal · 4.0mm · 0.81mm/px · 6 of 18 slices shown (1 of 2)]
[im 1/18]
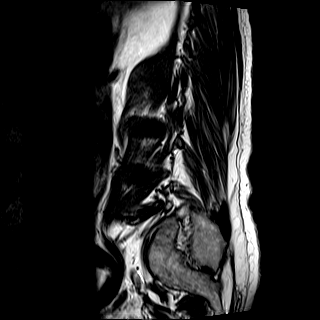
[im 4/18]
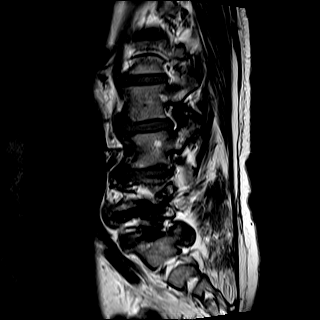
[im 7/18]
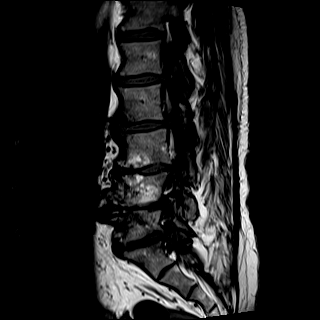
[im 11/18]
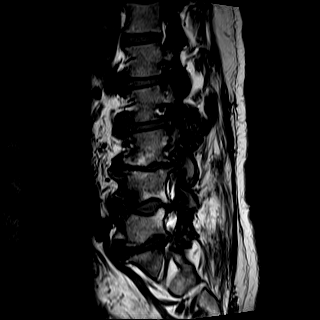
[im 14/18]
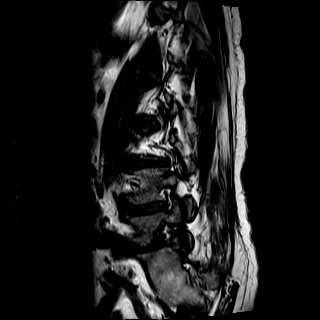
[im 18/18]
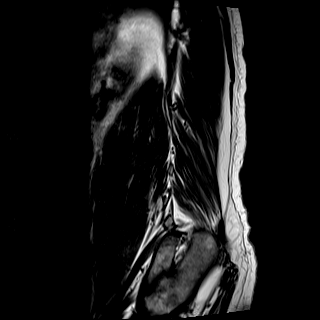

[Series 3: T2 · sagittal · 4.0mm · 0.81mm/px · 6 of 18 slices shown (1 of 2)]
[im 1/18]
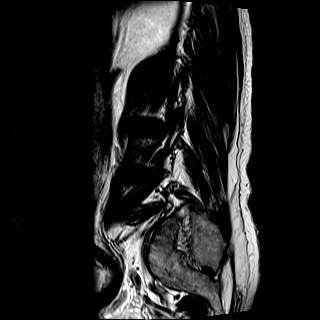
[im 4/18]
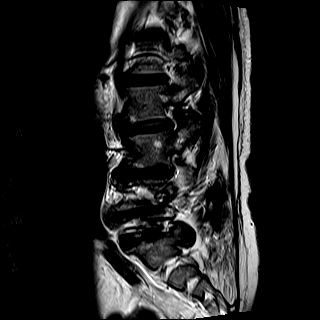
[im 7/18]
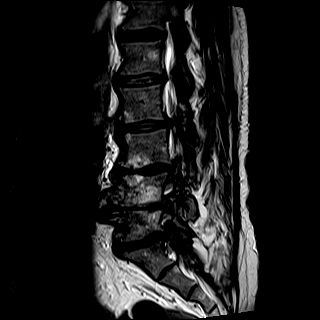
[im 11/18]
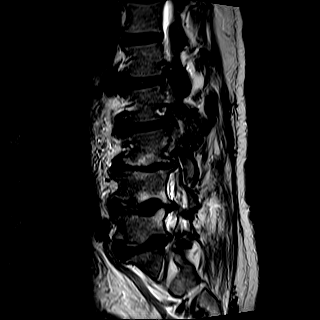
[im 14/18]
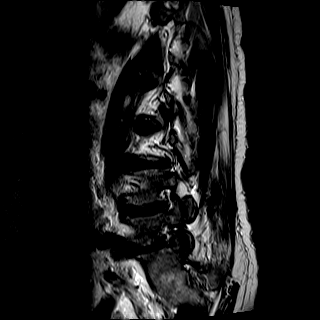
[im 18/18]
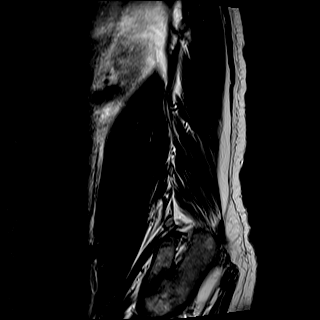

[Series 5: T2 · axial · 4.0mm · 0.39mm/px · z∈[-56,+163]mm · 9 of 45 slices shown (2 of 2)]
[im 1/45]
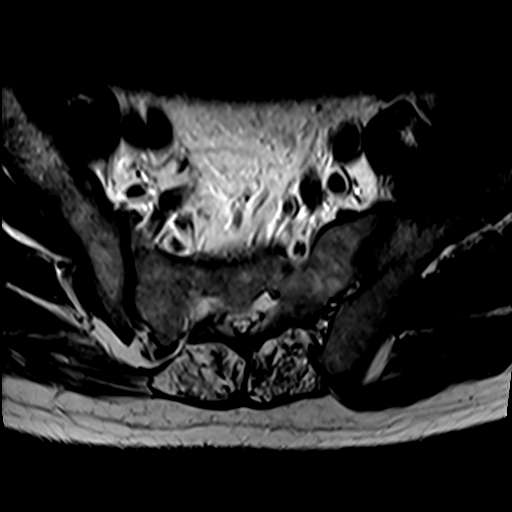
[im 7/45]
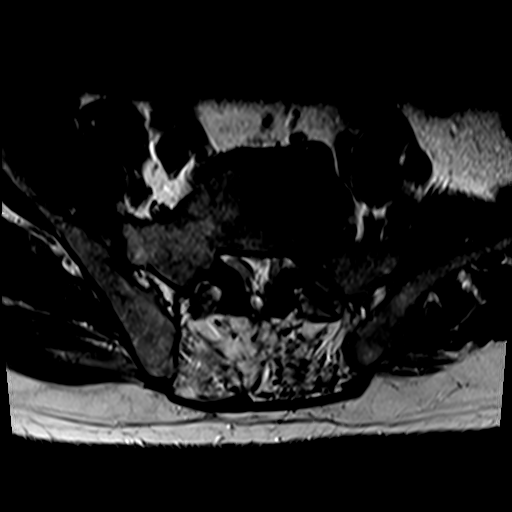
[im 13/45]
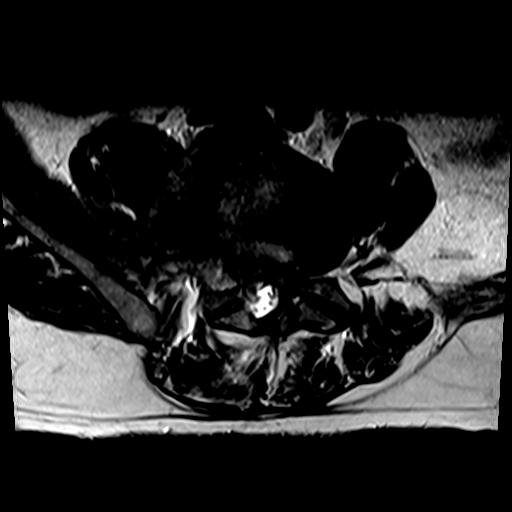
[im 19/45]
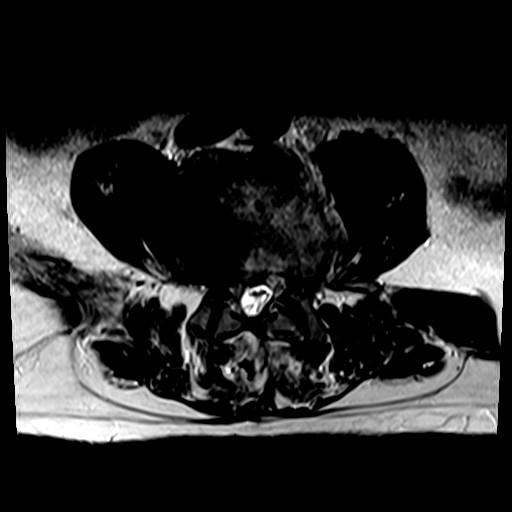
[im 23/45]
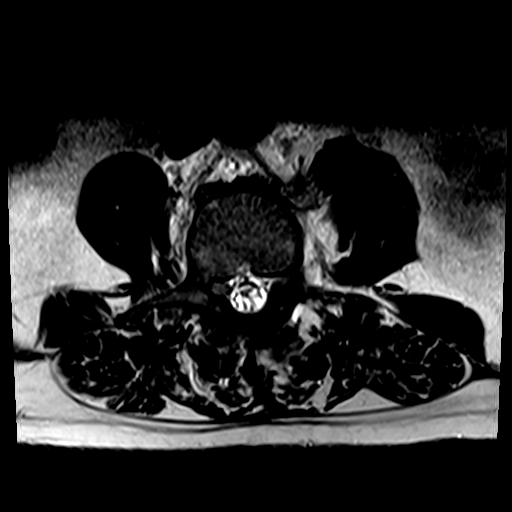
[im 26/45]
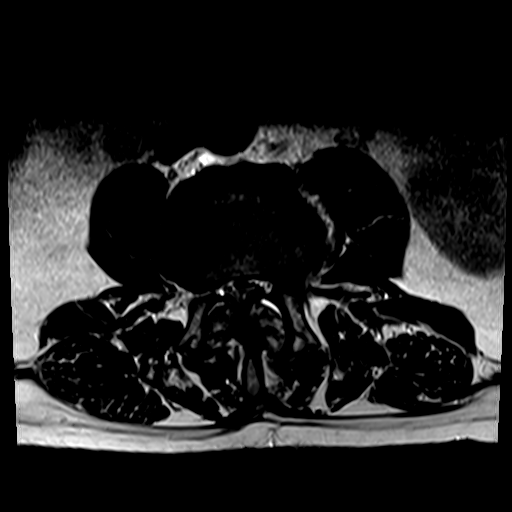
[im 32/45]
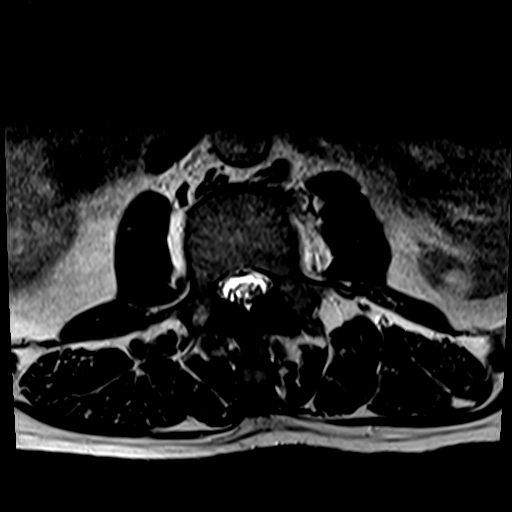
[im 38/45]
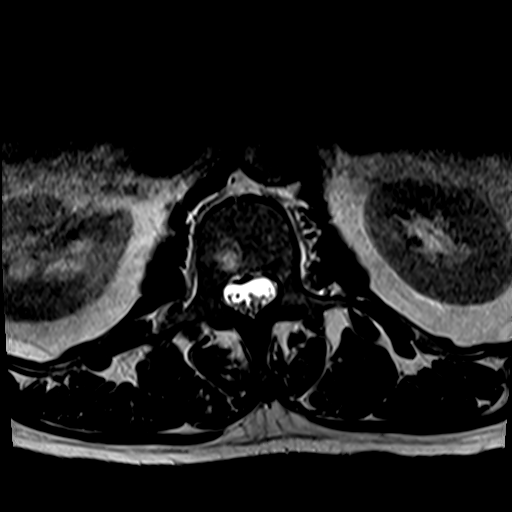
[im 45/45]
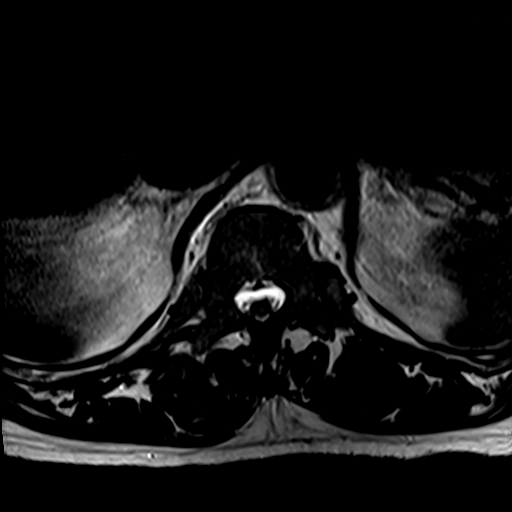

[Series 6: T1 · axial · 4.0mm · 0.39mm/px · z∈[-56,+128]mm · 4 of 45 slices shown (2 of 2)]
[im 1/45]
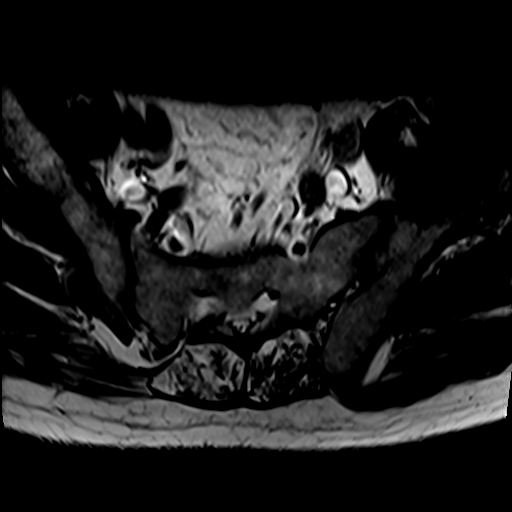
[im 7/45]
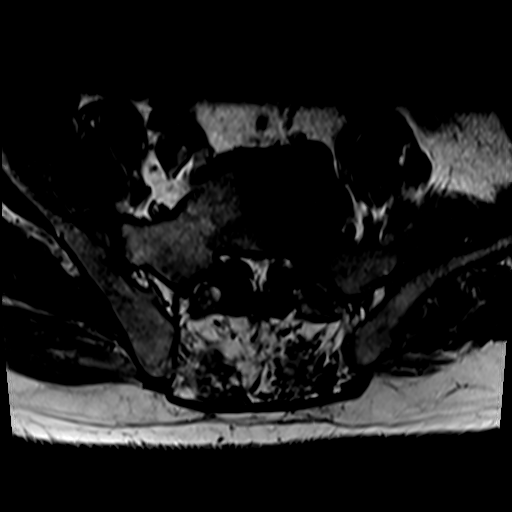
[im 23/45]
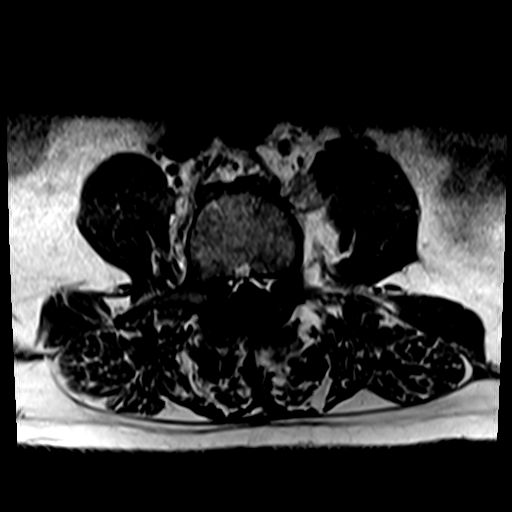
[im 38/45]
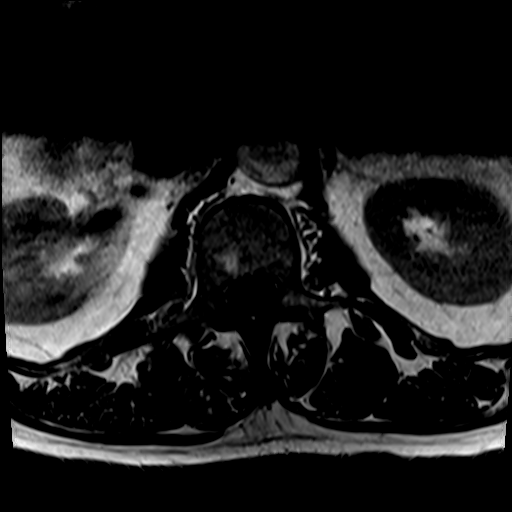

[25 of 48 positions shown; findings below may reference images not displayed]

FINDINGS: Segmentation:  Standard.

Alignment: Minimal grade 1 anterolisthesis of L2 on L3. 3 mm
retrolisthesis of L3 on L4.

Vertebrae:  No fracture, evidence of discitis, or bone lesion.

Conus medullaris and cauda equina: Conus extends to the T12 level.
Conus and cauda equina appear normal.

Paraspinal and other soft tissues: No acute paraspinal abnormality.

Disc levels:

Disc spaces: Degenerative disease with disc height loss at L3-4,
L4-5 and L5-S1 reactive endplate changes.

T12-L1: Mild broad-based disc bulge. Moderate bilateral facet
arthropathy. No evidence of neural foraminal stenosis. No central
canal stenosis.

L1-L2: Broad-based disc bulge. Severe bilateral facet arthropathy.
Moderate spinal stenosis. Bilateral subarticular recess stenosis.
Severe left foraminal stenosis. Moderate right foraminal stenosis.

L2-L3: Broad-based disc bulge flattening the ventral thecal sac.
Severe bilateral facet arthropathy with bilateral facet effusions.
Severe spinal stenosis. Moderate-severe bilateral foraminal
stenosis.

L3-L4: Broad-based disc osteophyte complex. Prior laminectomy.
Moderate bilateral facet arthropathy. Moderate-severe right
foraminal stenosis. Moderate left foraminal stenosis. No significant
spinal stenosis.

L4-L5: Broad-based disc osteophyte complex. Prior laminectomy.
Moderate bilateral facet arthropathy. Severe spinal stenosis. Severe
right foraminal stenosis. Moderate left foraminal stenosis.

L5-S1: Broad-based disc osteophyte complex. Prior laminectomy.
Prominence of the epidural fat deforming the thecal sac. Severe
bilateral foraminal stenosis.
IMPRESSION: 1. Diffuse lumbar spine spondylosis as described above.
2. No acute osseous injury of the lumbar spine.

## 2020-11-24 DIAGNOSIS — M48062 Spinal stenosis, lumbar region with neurogenic claudication: Secondary | ICD-10-CM | POA: Diagnosis not present

## 2020-12-21 DIAGNOSIS — Z0000 Encounter for general adult medical examination without abnormal findings: Secondary | ICD-10-CM | POA: Diagnosis not present

## 2020-12-21 DIAGNOSIS — N1831 Chronic kidney disease, stage 3a: Secondary | ICD-10-CM | POA: Diagnosis not present

## 2020-12-21 DIAGNOSIS — I10 Essential (primary) hypertension: Secondary | ICD-10-CM | POA: Diagnosis not present

## 2020-12-21 DIAGNOSIS — E7800 Pure hypercholesterolemia, unspecified: Secondary | ICD-10-CM | POA: Diagnosis not present

## 2020-12-21 DIAGNOSIS — R7301 Impaired fasting glucose: Secondary | ICD-10-CM | POA: Diagnosis not present

## 2021-01-06 DIAGNOSIS — H401134 Primary open-angle glaucoma, bilateral, indeterminate stage: Secondary | ICD-10-CM | POA: Diagnosis not present

## 2021-01-06 DIAGNOSIS — H5703 Miosis: Secondary | ICD-10-CM | POA: Diagnosis not present

## 2021-01-06 DIAGNOSIS — Z9889 Other specified postprocedural states: Secondary | ICD-10-CM | POA: Diagnosis not present

## 2021-01-06 DIAGNOSIS — H00021 Hordeolum internum right upper eyelid: Secondary | ICD-10-CM | POA: Diagnosis not present

## 2021-01-06 DIAGNOSIS — Z961 Presence of intraocular lens: Secondary | ICD-10-CM | POA: Diagnosis not present

## 2021-01-06 DIAGNOSIS — H2181 Floppy iris syndrome: Secondary | ICD-10-CM | POA: Diagnosis not present

## 2021-01-06 DIAGNOSIS — H04123 Dry eye syndrome of bilateral lacrimal glands: Secondary | ICD-10-CM | POA: Diagnosis not present

## 2021-01-09 DIAGNOSIS — M4180 Other forms of scoliosis, site unspecified: Secondary | ICD-10-CM | POA: Diagnosis not present

## 2021-01-09 DIAGNOSIS — I10 Essential (primary) hypertension: Secondary | ICD-10-CM | POA: Diagnosis not present

## 2021-01-09 DIAGNOSIS — M48062 Spinal stenosis, lumbar region with neurogenic claudication: Secondary | ICD-10-CM | POA: Diagnosis not present

## 2021-02-21 DIAGNOSIS — H00021 Hordeolum internum right upper eyelid: Secondary | ICD-10-CM | POA: Diagnosis not present

## 2021-02-21 DIAGNOSIS — H04123 Dry eye syndrome of bilateral lacrimal glands: Secondary | ICD-10-CM | POA: Diagnosis not present

## 2021-02-21 DIAGNOSIS — Z9889 Other specified postprocedural states: Secondary | ICD-10-CM | POA: Diagnosis not present

## 2021-02-21 DIAGNOSIS — H5703 Miosis: Secondary | ICD-10-CM | POA: Diagnosis not present

## 2021-02-21 DIAGNOSIS — H401134 Primary open-angle glaucoma, bilateral, indeterminate stage: Secondary | ICD-10-CM | POA: Diagnosis not present

## 2021-02-21 DIAGNOSIS — H2181 Floppy iris syndrome: Secondary | ICD-10-CM | POA: Diagnosis not present

## 2021-02-21 DIAGNOSIS — Z961 Presence of intraocular lens: Secondary | ICD-10-CM | POA: Diagnosis not present

## 2021-02-27 DIAGNOSIS — M48062 Spinal stenosis, lumbar region with neurogenic claudication: Secondary | ICD-10-CM | POA: Diagnosis not present

## 2021-03-01 DIAGNOSIS — Z23 Encounter for immunization: Secondary | ICD-10-CM | POA: Diagnosis not present

## 2021-06-13 ENCOUNTER — Other Ambulatory Visit: Payer: Self-pay | Admitting: Urology

## 2021-06-19 DIAGNOSIS — N1831 Chronic kidney disease, stage 3a: Secondary | ICD-10-CM | POA: Diagnosis not present

## 2021-06-19 DIAGNOSIS — E7800 Pure hypercholesterolemia, unspecified: Secondary | ICD-10-CM | POA: Diagnosis not present

## 2021-06-19 DIAGNOSIS — R7301 Impaired fasting glucose: Secondary | ICD-10-CM | POA: Diagnosis not present

## 2021-06-19 DIAGNOSIS — I10 Essential (primary) hypertension: Secondary | ICD-10-CM | POA: Diagnosis not present

## 2021-06-30 DIAGNOSIS — H2181 Floppy iris syndrome: Secondary | ICD-10-CM | POA: Diagnosis not present

## 2021-06-30 DIAGNOSIS — H5703 Miosis: Secondary | ICD-10-CM | POA: Diagnosis not present

## 2021-06-30 DIAGNOSIS — Z961 Presence of intraocular lens: Secondary | ICD-10-CM | POA: Diagnosis not present

## 2021-06-30 DIAGNOSIS — H401134 Primary open-angle glaucoma, bilateral, indeterminate stage: Secondary | ICD-10-CM | POA: Diagnosis not present

## 2021-06-30 DIAGNOSIS — H04123 Dry eye syndrome of bilateral lacrimal glands: Secondary | ICD-10-CM | POA: Diagnosis not present

## 2021-07-12 DIAGNOSIS — Z87440 Personal history of urinary (tract) infections: Secondary | ICD-10-CM | POA: Diagnosis not present

## 2021-08-14 DIAGNOSIS — J309 Allergic rhinitis, unspecified: Secondary | ICD-10-CM | POA: Diagnosis not present

## 2021-08-14 DIAGNOSIS — R519 Headache, unspecified: Secondary | ICD-10-CM | POA: Diagnosis not present

## 2021-08-14 DIAGNOSIS — J0100 Acute maxillary sinusitis, unspecified: Secondary | ICD-10-CM | POA: Diagnosis not present

## 2021-09-21 DIAGNOSIS — N1831 Chronic kidney disease, stage 3a: Secondary | ICD-10-CM | POA: Diagnosis not present

## 2021-09-21 DIAGNOSIS — E7800 Pure hypercholesterolemia, unspecified: Secondary | ICD-10-CM | POA: Diagnosis not present

## 2021-09-21 DIAGNOSIS — I10 Essential (primary) hypertension: Secondary | ICD-10-CM | POA: Diagnosis not present

## 2021-10-16 DIAGNOSIS — M79671 Pain in right foot: Secondary | ICD-10-CM | POA: Diagnosis not present

## 2021-10-24 DIAGNOSIS — M4150 Other secondary scoliosis, site unspecified: Secondary | ICD-10-CM | POA: Diagnosis not present

## 2021-10-24 DIAGNOSIS — M4180 Other forms of scoliosis, site unspecified: Secondary | ICD-10-CM | POA: Diagnosis not present

## 2021-10-30 DIAGNOSIS — H401134 Primary open-angle glaucoma, bilateral, indeterminate stage: Secondary | ICD-10-CM | POA: Diagnosis not present

## 2021-10-30 DIAGNOSIS — H04123 Dry eye syndrome of bilateral lacrimal glands: Secondary | ICD-10-CM | POA: Diagnosis not present

## 2021-10-30 DIAGNOSIS — H5703 Miosis: Secondary | ICD-10-CM | POA: Diagnosis not present

## 2021-10-30 DIAGNOSIS — Z961 Presence of intraocular lens: Secondary | ICD-10-CM | POA: Diagnosis not present

## 2021-10-30 DIAGNOSIS — H2181 Floppy iris syndrome: Secondary | ICD-10-CM | POA: Diagnosis not present

## 2021-12-01 DIAGNOSIS — N481 Balanitis: Secondary | ICD-10-CM | POA: Diagnosis not present

## 2021-12-14 DIAGNOSIS — E7800 Pure hypercholesterolemia, unspecified: Secondary | ICD-10-CM | POA: Diagnosis not present

## 2021-12-14 DIAGNOSIS — N1831 Chronic kidney disease, stage 3a: Secondary | ICD-10-CM | POA: Diagnosis not present

## 2021-12-14 DIAGNOSIS — I10 Essential (primary) hypertension: Secondary | ICD-10-CM | POA: Diagnosis not present

## 2022-01-09 DIAGNOSIS — R0602 Shortness of breath: Secondary | ICD-10-CM | POA: Diagnosis not present

## 2022-01-09 DIAGNOSIS — I10 Essential (primary) hypertension: Secondary | ICD-10-CM | POA: Diagnosis not present

## 2022-01-09 DIAGNOSIS — R079 Chest pain, unspecified: Secondary | ICD-10-CM | POA: Diagnosis not present

## 2022-01-09 DIAGNOSIS — Z0000 Encounter for general adult medical examination without abnormal findings: Secondary | ICD-10-CM | POA: Diagnosis not present

## 2022-01-09 DIAGNOSIS — E7800 Pure hypercholesterolemia, unspecified: Secondary | ICD-10-CM | POA: Diagnosis not present

## 2022-01-09 DIAGNOSIS — N1831 Chronic kidney disease, stage 3a: Secondary | ICD-10-CM | POA: Diagnosis not present

## 2022-01-22 ENCOUNTER — Other Ambulatory Visit (HOSPITAL_COMMUNITY): Payer: Self-pay | Admitting: Family Medicine

## 2022-01-22 DIAGNOSIS — R0602 Shortness of breath: Secondary | ICD-10-CM

## 2022-01-29 ENCOUNTER — Ambulatory Visit (HOSPITAL_COMMUNITY)
Admission: RE | Admit: 2022-01-29 | Discharge: 2022-01-29 | Disposition: A | Discharge status: Home / Self Care | Payer: Medicare Other | Source: Ambulatory Visit | Attending: Family Medicine | Admitting: Family Medicine

## 2022-01-29 DIAGNOSIS — I081 Rheumatic disorders of both mitral and tricuspid valves: Secondary | ICD-10-CM | POA: Diagnosis not present

## 2022-01-29 DIAGNOSIS — I10 Essential (primary) hypertension: Secondary | ICD-10-CM | POA: Diagnosis not present

## 2022-01-29 DIAGNOSIS — I77819 Aortic ectasia, unspecified site: Secondary | ICD-10-CM | POA: Insufficient documentation

## 2022-01-29 DIAGNOSIS — R0602 Shortness of breath: Secondary | ICD-10-CM | POA: Diagnosis not present

## 2022-01-29 LAB — ECHOCARDIOGRAM COMPLETE
Area-P 1/2: 3.2 cm2
Calc EF: 56.1 %
MV M vel: 5.71 m/s
MV Peak grad: 130.4 mmHg
Radius: 0.3 cm
S' Lateral: 2.8 cm
Single Plane A2C EF: 52.8 %
Single Plane A4C EF: 60.1 %

## 2022-01-29 NOTE — Progress Notes (Signed)
Echocardiogram 2D Echocardiogram has been performed.  Jared [name] 01/29/2022, 1:40 PM

## 2022-02-10 DIAGNOSIS — S0502XA Injury of conjunctiva and corneal abrasion without foreign body, left eye, initial encounter: Secondary | ICD-10-CM | POA: Diagnosis not present

## 2022-02-10 DIAGNOSIS — H5712 Ocular pain, left eye: Secondary | ICD-10-CM | POA: Diagnosis not present

## 2022-02-13 ENCOUNTER — Telehealth: Payer: Self-pay | Admitting: Podiatry

## 2022-02-13 ENCOUNTER — Ambulatory Visit: Payer: Medicare Other | Admitting: Podiatry

## 2022-02-13 ENCOUNTER — Ambulatory Visit (INDEPENDENT_AMBULATORY_CARE_PROVIDER_SITE_OTHER): Payer: Medicare Other

## 2022-02-13 DIAGNOSIS — M25571 Pain in right ankle and joints of right foot: Secondary | ICD-10-CM | POA: Diagnosis not present

## 2022-02-13 DIAGNOSIS — M778 Other enthesopathies, not elsewhere classified: Secondary | ICD-10-CM | POA: Diagnosis not present

## 2022-02-13 DIAGNOSIS — Q742 Other congenital malformations of lower limb(s), including pelvic girdle: Secondary | ICD-10-CM

## 2022-02-13 MED ORDER — MELOXICAM 7.5 MG PO TABS: 7.5000 mg | ORAL_TABLET | Freq: Every day | ORAL | 0 refills | Status: DC

## 2022-02-13 NOTE — Telephone Encounter (Signed)
Patient wants to know if there is something else he can take - the pharmacist said that he should not take this because he is having problems with his kidneys  meloxicam (MOBIC) 7.5 MG tablet , please advise .

## 2022-02-13 NOTE — Progress Notes (Signed)
Subjective:  Patient ID: Jared [name], male    DOB: [birth year],  MRN: [id number]  Chief Complaint  Patient presents with   Foot Pain    Patient complaining of pain to bottom of right foot near the heel. Burning sensation. Patient does not take any medication. Patient has tried using inserts from the store.     76 y.o. male presents with pain at the inside of his right medial rear foot and ankle area.  He says has been present for many years.  Not sure exactly how long.  Not tried much for it yet.  He has seen a vascular doctor recently for concern for lack of pedal pulses but was told everything was all right per the note that I read.  He says the pain is primarily aggravated with gardening and walking.  Past Medical History:  Diagnosis Date   Arthritis    knees and back   Enlarged prostate    takes Flomax   Headache    Hypertension    stress test done about 12 yrs ago - results wnl per pt.    No Known Allergies  ROS: Negative except as per HPI above  Objective:  General: AAO x3, NAD  Dermatological: With inspection and palpation of the right and left lower extremities there are no open sores, no preulcerative lesions, no rash or signs of infection present. Nails are of normal length thickness and coloration.   Vascular:  Dorsalis Pedis artery and Posterior Tibial artery pedal pulses are weakly palpable on the right foot  Neruologic: Grossly intact via light touch bilateral. Protective threshold intact to all sites bilateral. Patellar and Achilles deep tendon reflexes 2+ bilateral. Negative Babinski reflex.   Musculoskeletal: Osseous prominence located at the medial aspect of the navicular tuberosity.  There is pain over the navicular tuberosity and the distal portion of the posterior tibial tendon as it inserts into this area.  Gait: Unassisted, Nonantalgic.   No images are attached to the encounter.  Radiographs:  Date: February 13, 2022 XR right foot weightbearing  AP/Lateral/Oblique   Findings: Attention directed to the right midfoot on AP view there is noted to be large circular osseous fragment consistent with an accessory ossicle/Oz navicularis at the navicular tuberosity.  This this is completely detached from the primary navicular body.  Mild soft tissue edema at this area. 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 Acute right ankle pain   3. Capsulitis of foot, right      Plan:  Patient was evaluated and treated and all questions answered.  #Painful accessory navicular bone of the right foot -Discussed with the patient that his pain is likely related to a accessory bone present at the insertion of the posterior tibial tendon navicular.  He has likely had this accessory ossicle for many many years and has only recently worsened in terms of the pain that is causing him. -Conservatively can proceed with anti-inflammatory medications could consider steroid injection and offloading pads around the area. -Surgically the consideration would be for removal of the accessory ossicle however the patient is at high risk for surgery related to his peripheral arterial disease in the right lower extremity.  He does not want to proceed with surgery and I do not believe that is the best course of action for him either. -We will proceed with meloxicam 7.5 mg once daily as needed for pain control.  If this helps he can take it if not I  would recommend against using it.  Otherwise use offloading pads. -If he continues to have pain in the future and it worsens we can consider steroid injection about the area.  Patient will follow-up as needed  Return if symptoms worsen or fail to improve.          Corinna Gab, DPM [facility]

## 2022-02-13 NOTE — Telephone Encounter (Signed)
Left a detailed message letting the patient know to take Tylenol instead and is he has nay questions or concerns to give our office a call back.

## 2022-02-15 DIAGNOSIS — Z23 Encounter for immunization: Secondary | ICD-10-CM | POA: Diagnosis not present

## 2022-02-27 ENCOUNTER — Encounter: Payer: Self-pay | Admitting: Internal Medicine

## 2022-02-27 ENCOUNTER — Ambulatory Visit: Payer: Medicare Other | Admitting: Internal Medicine

## 2022-02-27 VITALS — BP 142/70 | HR 60 | Temp 97.6°F | Resp 16 | Ht 71.0 in | Wt 197.0 lb

## 2022-02-27 DIAGNOSIS — H401134 Primary open-angle glaucoma, bilateral, indeterminate stage: Secondary | ICD-10-CM | POA: Diagnosis not present

## 2022-02-27 DIAGNOSIS — Z961 Presence of intraocular lens: Secondary | ICD-10-CM | POA: Diagnosis not present

## 2022-02-27 DIAGNOSIS — R002 Palpitations: Secondary | ICD-10-CM | POA: Diagnosis not present

## 2022-02-27 DIAGNOSIS — H04123 Dry eye syndrome of bilateral lacrimal glands: Secondary | ICD-10-CM | POA: Diagnosis not present

## 2022-02-27 DIAGNOSIS — I10 Essential (primary) hypertension: Secondary | ICD-10-CM | POA: Diagnosis not present

## 2022-02-27 DIAGNOSIS — R0609 Other forms of dyspnea: Secondary | ICD-10-CM | POA: Diagnosis not present

## 2022-02-27 DIAGNOSIS — H2181 Floppy iris syndrome: Secondary | ICD-10-CM | POA: Diagnosis not present

## 2022-02-27 DIAGNOSIS — H5703 Miosis: Secondary | ICD-10-CM | POA: Diagnosis not present

## 2022-02-27 MED ORDER — LISINOPRIL 5 MG PO TABS: 5.0000 mg | ORAL_TABLET | Freq: Every day | ORAL | 3 refills | Status: DC

## 2022-02-27 NOTE — Progress Notes (Signed)
Primary Physician/Referring:  Kristen Loader, FNP  Patient ID: Jared [name], male    DOB: 1945-11-03, 76 y.o.   MRN: [id number]  No chief complaint on file.  HPI:    [name]  is a 76 y.o. male with past medical history significant for hypertension, hyperlipidemia, and valvular heart disease who is here to establish care with cardiology.  He had an echocardiogram performed last month which did show moderate tricuspid regurgitation in addition to mild to moderate mitral regurgitation.  Patient has never had a stress test in the past.  He does admit to chest pain and shortness of breath with even minimal activity lately.  He states he gets short of breath just walking to the mailbox or going up any flight of stairs.  He denies heart failure symptoms such as PND, orthopnea, edema.  Patient states he never feels like he gets any swelling in his legs or anywhere else in his body.  His blood pressure and heart rate are well controlled.  He has never had ischemic work-up in the past but he is agreeable to getting a stress test.  Past Medical History:  Diagnosis Date  . Arthritis    knees and back  . Enlarged prostate    takes Flomax  . Headache   . Hypertension    stress test done about 12 yrs ago - results wnl per pt.   Past Surgical History:  Procedure Laterality Date  . BACK SURGERY  2002  . JOINT REPLACEMENT    . KNEE ARTHROSCOPY Left 05/2004   Archie Endo 09/27/2010  . SHOULDER ARTHROSCOPY WITH OPEN ROTATOR CUFF REPAIR Right 2009   Archie Endo 09/13/2010  . 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 TOTAL HIP ARTHROPLASTY Left 10/02/2017   Procedure: LEFT TOTAL HIP ARTHROPLASTY ANTERIOR APPROACH;  Surgeon: Frederik Pear, MD;  Location: [place];  Service: Orthopedics;  Laterality: Left;  . TOTAL KNEE ARTHROPLASTY  04/16/2011   Procedure: TOTAL KNEE ARTHROPLASTY;  Surgeon: Kerin Salen;  Location: [place];  Service: Orthopedics;  Laterality: Left;  LEFT TOTAL KNEE ARTHROPLASTY   . TOTAL KNEE  ARTHROPLASTY Right 04/19/2014   Procedure: RIGHT TOTAL KNEE ARTHROPLASTY;  Surgeon: Kerin Salen, MD;  Location: [place];  Service: Orthopedics;  Laterality: Right;   Family History  Problem Relation Age of Onset  . Migraines Mother   . Cancer Sister   . Cancer Sister     Social History   Tobacco Use  . Smoking status: Former    Packs/day: 1.00    Years: 10.00    Total pack years: 10.00    Types: Cigarettes    Quit date: 09/07/1974    Years since quitting: 47.5  . Smokeless tobacco: Never  Substance Use Topics  . Alcohol use: No    Comment: quit 1976 - beer   Marital Status: Married  ROS  Review of Systems  Cardiovascular:  Positive for chest pain and dyspnea on exertion.  Respiratory:  Positive for shortness of breath.   Objective  Blood pressure (!) 142/70, pulse 60, temperature 97.6 F (36.4 C), temperature source Temporal, resp. rate 16, height 5\' 11"  (1.803 m), weight 197 lb (89.4 kg), SpO2 100 %. Body mass index is 27.48 kg/m.     02/27/2022    2:45 PM 12/17/2018   10:32 AM 10/03/2017    4:48 AM  Vitals with BMI  Height 5\' 11"  5\' 11"    Weight 197 lbs 211 lbs   BMI 27.49 29.44  Systolic [id number]  Diastolic 70 85 93  Pulse 60 59 48     Physical Exam Vitals and nursing note reviewed.  HENT:     Head: Normocephalic and atraumatic.  Neck:     Vascular: No carotid bruit.  Cardiovascular:     Rate and Rhythm: Normal rate and regular rhythm.     Pulses: Normal pulses.     Heart sounds: Murmur heard.  Pulmonary:     Effort: Pulmonary effort is normal.     Breath sounds: Normal breath sounds.  Abdominal:     General: Bowel sounds are normal.  Musculoskeletal:     Right lower leg: No edema.     Left lower leg: No edema.  Skin:    General: Skin is warm and dry.  Neurological:     Mental Status: He is alert.   Medications and allergies  No Known Allergies   Medication list after today's encounter   Current Outpatient Medications:  .  amLODipine  (NORVASC) 10 MG tablet, Take 10 mg by mouth daily. , Disp: , Rfl:  .  atorvastatin (LIPITOR) 10 MG tablet, Take 10 mg by mouth daily., Disp: , Rfl:  .  brimonidine (ALPHAGAN) 0.2 % ophthalmic solution, Place 1 drop into both eyes 2 (two) times daily. , Disp: , Rfl:  .  dorzolamide-timolol (COSOPT) 22.3-6.8 MG/ML ophthalmic solution, Place 1 drop into both eyes 2 (two) times daily., Disp: , Rfl: 11 .  latanoprost (XALATAN) 0.005 % ophthalmic solution, Place 1 drop into both eyes at bedtime., Disp: , Rfl:  .  Multiple Vitamins-Minerals (MULTIVITAMINS THER. W/MINERALS) TABS, Take 1 tablet by mouth daily.  , Disp: , Rfl:  .  tamsulosin (FLOMAX) 0.4 MG CAPS capsule, Take 0.4 mg by mouth daily after supper. , Disp: , Rfl:   Laboratory examination:   Lab Results  Component Value Date   NA 136 10/03/2017   K 4.0 10/03/2017   CO2 25 10/03/2017   GLUCOSE 163 (H) 10/03/2017   BUN 14 10/03/2017   CREATININE 1.34 (H) 10/03/2017   CALCIUM 8.7 (L) 10/03/2017   GFRNONAA 51 (L) 10/03/2017       Latest Ref Rng & Units 10/03/2017    3:52 AM 09/20/2017    9:46 AM 04/13/2014    9:27 AM  CMP  Glucose 65 - 99 mg/dL 163  86  94   BUN 6 - 20 mg/dL 14  12  13    Creatinine 0.61 - 1.24 mg/dL 1.34  1.29  1.23   Sodium 135 - 145 mmol/L 136  143  139   Potassium 3.5 - 5.1 mmol/L 4.0  4.0  4.2   Chloride 101 - 111 mmol/L 102  110  101   CO2 22 - 32 mmol/L 25  26  26    Calcium 8.9 - 10.3 mg/dL 8.7  9.4  9.2       Latest Ref Rng & Units 10/03/2017    3:52 AM 09/20/2017    9:46 AM 04/21/2014    3:34 AM  CBC  WBC 4.0 - 10.5 K/uL 9.5  4.0  11.1   Hemoglobin 13.0 - 17.0 g/dL 12.8  14.1  11.1   Hematocrit 39.0 - 52.0 % 38.8  41.6  32.6   Platelets 150 - 400 K/uL 167  175  155     Lipid Panel No results for input(s): "CHOL", "TRIG", "LDLCALC", "VLDL", "HDL", "CHOLHDL", "LDLDIRECT" in the last 8760 hours.  HEMOGLOBIN A1C No results found for: "HGBA1C", "  MPG" TSH No results for input(s): "TSH" in the last  8760 hours.  External labs:     Radiology:    Cardiac Studies:   01/29/2022 Echocardiogram IMPRESSIONS   1. Left ventricular ejection fraction, by estimation, is 55 to 60%. The left ventricle has normal function. The left ventricle has no regional wall motion abnormalities. Left ventricular diastolic parameters are consistent with Grade II diastolic dysfunction (pseudonormalization).   2. Right ventricular systolic function is normal. The right ventricular size is mildly enlarged. There is mildly elevated pulmonary artery systolic pressure. The estimated right ventricular systolic pressure is 99991111 mmHg.   3. The mitral valve is grossly normal. Mild to moderate mitral valve regurgitation. No evidence of mitral stenosis.   4. The tricuspid valve is abnormal. Tricuspid valve regurgitation is moderate.   5. The aortic valve is tricuspid. There is mild thickening of the aortic valve. Aortic valve regurgitation is trivial. No aortic stenosis is present.   6. There is mild dilatation of the ascending aorta, measuring 42 mm.   7. The inferior vena cava is normal in size with <50% respiratory variability, suggesting right atrial pressure of 8 mmHg.    EKG:   02/27/2022 normal sinus rhythm, single PVC, normal R wave progression, normal axis, no evidence of ischemia  Assessment     ICD-10-CM   1. Palpitations  R00.2 EKG 12-Lead       Orders Placed This Encounter  Procedures  . EKG 12-Lead    No orders of the defined types were placed in this encounter.   Medications Discontinued During This Encounter  Medication Reason  . BRIMONIDINE-DORZOLAMIDE OP   . indomethacin (INDOCIN) 50 MG capsule   . meloxicam (MOBIC) 7.5 MG tablet   . timolol (TIMOPTIC) 0.5 % ophthalmic solution      Recommendations:   MAMORU HERRIG is a 76 y.o.  M with HTN, HLD, and VHD   Palpitations EKG reviewed with patient No evidence of ischemia or arrhythmia on EKG   Essential hypertension Continue  current cardiac medications Will add low-dose lisinopril as blood pressure is not optimal Encouraged DASH diet   DOE (dyspnea on exertion) Echocardiogram results reviewed with the patient Discussed heart failure symptoms to be cognizant of Stress test ordered Follow-up in 4 weeks or sooner if needed     Floydene Flock, DO, [facility]  02/27/2022, 2:59 PM Office: [phone] Pager: [phone]

## 2022-03-20 ENCOUNTER — Ambulatory Visit: Payer: Medicare Other

## 2022-03-20 DIAGNOSIS — R0609 Other forms of dyspnea: Secondary | ICD-10-CM | POA: Diagnosis not present

## 2022-03-20 DIAGNOSIS — R002 Palpitations: Secondary | ICD-10-CM

## 2022-03-20 DIAGNOSIS — I10 Essential (primary) hypertension: Secondary | ICD-10-CM | POA: Diagnosis not present

## 2022-03-27 ENCOUNTER — Ambulatory Visit: Payer: Medicare Other | Admitting: Internal Medicine

## 2022-03-27 ENCOUNTER — Encounter: Payer: Self-pay | Admitting: Internal Medicine

## 2022-03-27 VITALS — BP 163/83 | HR 60 | Ht 71.0 in | Wt 196.0 lb

## 2022-03-27 DIAGNOSIS — I5032 Chronic diastolic (congestive) heart failure: Secondary | ICD-10-CM | POA: Insufficient documentation

## 2022-03-27 DIAGNOSIS — R002 Palpitations: Secondary | ICD-10-CM | POA: Diagnosis not present

## 2022-03-27 DIAGNOSIS — I10 Essential (primary) hypertension: Secondary | ICD-10-CM

## 2022-03-27 DIAGNOSIS — E782 Mixed hyperlipidemia: Secondary | ICD-10-CM | POA: Diagnosis not present

## 2022-03-27 MED ORDER — LISINOPRIL 20 MG PO TABS: 20.0000 mg | ORAL_TABLET | Freq: Every day | ORAL | 3 refills | Status: DC

## 2022-03-27 NOTE — Progress Notes (Signed)
Primary Physician/Referring:  Kristen Loader, FNP  Patient ID: Jared [name], male    DOB: 04-18-1946, 76 y.o.   MRN: [id number]  Chief Complaint  Patient presents with   Palpitations   Follow-up   Results    HPI:    Jared [name]  is a 76 y.o. male with past medical history significant for hypertension, hyperlipidemia, and valvular heart disease who is here for a follow-up visit. His stress test came back normal but his blood pressure was very high during the stress test. Patient thinks this may have been causing palpitations and chest tightness when he is doing things. He is agreeable to increasing his lisinopril. Patient instructed to call if any side effects with higher dose. He will continue to keep log of his pressures. Denies chest pain, shortness of breath, diaphoresis, syncope, edema, PND, orthopnea.   Past Medical History:  Diagnosis Date   Arthritis    knees and back   Enlarged prostate    takes Flomax   Headache    Hypertension    stress test done about 12 yrs ago - results wnl per pt.   Past Surgical History:  Procedure Laterality Date   BACK SURGERY  2002   JOINT REPLACEMENT     KNEE ARTHROSCOPY Left 05/2004   Archie Endo 09/27/2010   SHOULDER ARTHROSCOPY WITH OPEN ROTATOR CUFF REPAIR Right 2009   /notes 09/13/2010   TOTAL HIP ARTHROPLASTY Left 10/02/2017   TOTAL HIP ARTHROPLASTY Left 10/02/2017   Procedure: LEFT TOTAL HIP ARTHROPLASTY ANTERIOR APPROACH;  Surgeon: Frederik Pear, MD;  Location: [place];  Service: Orthopedics;  Laterality: Left;   TOTAL KNEE ARTHROPLASTY  04/16/2011   Procedure: TOTAL KNEE ARTHROPLASTY;  Surgeon: Kerin Salen;  Location: [place];  Service: Orthopedics;  Laterality: Left;  LEFT TOTAL KNEE ARTHROPLASTY    TOTAL KNEE ARTHROPLASTY Right 04/19/2014   Procedure: RIGHT TOTAL KNEE ARTHROPLASTY;  Surgeon: Kerin Salen, MD;  Location: [place];  Service: Orthopedics;  Laterality: Right;   Family History  Problem Relation Age of Onset   Migraines  Mother    Cancer Sister    Cancer Sister     Social History   Tobacco Use   Smoking status: Former    Packs/day: 1.00    Years: 10.00    Total pack years: 10.00    Types: Cigarettes    Quit date: 09/07/1974    Years since quitting: 47.5   Smokeless tobacco: Never  Substance Use Topics   Alcohol use: No    Comment: quit 1976 - beer   Marital Status: Married  ROS  Review of Systems  Cardiovascular:  Positive for palpitations. Negative for chest pain and dyspnea on exertion.  Respiratory:  Negative for shortness of breath.    Objective  Blood pressure (!) 163/83, pulse 60, height 5\' 11"  (1.803 m), weight 196 lb (88.9 kg), SpO2 98 %. Body mass index is 27.34 kg/m.     03/27/2022    9:24 AM 03/27/2022    9:21 AM 02/27/2022    2:45 PM  Vitals with BMI  Height  5\' 11"  5\' 11"   Weight  196 lbs 197 lbs  BMI  [id number]  Systolic [id number]  Diastolic 83 76 70  Pulse 60 65 60     Physical Exam Vitals and nursing note reviewed.  HENT:     Head: Normocephalic and atraumatic.  Neck:     Vascular: No carotid bruit.  Cardiovascular:  Rate and Rhythm: Normal rate and regular rhythm.     Pulses: Normal pulses.     Heart sounds: Murmur heard.  Pulmonary:     Effort: Pulmonary effort is normal.     Breath sounds: Normal breath sounds.  Abdominal:     General: Bowel sounds are normal.  Musculoskeletal:     Right lower leg: No edema.     Left lower leg: No edema.  Skin:    General: Skin is warm and dry.  Neurological:     Mental Status: He is alert.    Medications and allergies  No Known Allergies   Medication list after today's encounter   Current Outpatient Medications:    amLODipine (NORVASC) 10 MG tablet, Take 10 mg by mouth daily. , Disp: , Rfl:    atorvastatin (LIPITOR) 10 MG tablet, Take 10 mg by mouth daily., Disp: , Rfl:    brimonidine (ALPHAGAN) 0.2 % ophthalmic solution, Place 1 drop into both eyes 2 (two) times daily. , Disp: , Rfl:     dorzolamide-timolol (COSOPT) 22.3-6.8 MG/ML ophthalmic solution, Place 1 drop into both eyes 2 (two) times daily., Disp: , Rfl: 11   latanoprost (XALATAN) 0.005 % ophthalmic solution, Place 1 drop into both eyes at bedtime., Disp: , Rfl:    lisinopril (ZESTRIL) 20 MG tablet, Take 1 tablet (20 mg total) by mouth at bedtime., Disp: 90 tablet, Rfl: 3   Multiple Vitamins-Minerals (MULTIVITAMINS THER. W/MINERALS) TABS, Take 1 tablet by mouth daily.  , Disp: , Rfl:    tamsulosin (FLOMAX) 0.4 MG CAPS capsule, Take 0.4 mg by mouth daily after supper. , Disp: , Rfl:   Laboratory examination:   Lab Results  Component Value Date   NA 136 10/03/2017   K 4.0 10/03/2017   CO2 25 10/03/2017   GLUCOSE 163 (H) 10/03/2017   BUN 14 10/03/2017   CREATININE 1.34 (H) 10/03/2017   CALCIUM 8.7 (L) 10/03/2017   GFRNONAA 51 (L) 10/03/2017       Latest Ref Rng & Units 10/03/2017    3:52 AM 09/20/2017    9:46 AM 04/13/2014    9:27 AM  CMP  Glucose 65 - 99 mg/dL 163  86  94   BUN 6 - 20 mg/dL 14  12  13    Creatinine 0.61 - 1.24 mg/dL 1.34  1.29  1.23   Sodium 135 - 145 mmol/L 136  143  139   Potassium 3.5 - 5.1 mmol/L 4.0  4.0  4.2   Chloride 101 - 111 mmol/L 102  110  101   CO2 22 - 32 mmol/L 25  26  26    Calcium 8.9 - 10.3 mg/dL 8.7  9.4  9.2       Latest Ref Rng & Units 10/03/2017    3:52 AM 09/20/2017    9:46 AM 04/21/2014    3:34 AM  CBC  WBC 4.0 - 10.5 K/uL 9.5  4.0  11.1   Hemoglobin 13.0 - 17.0 g/dL 12.8  14.1  11.1   Hematocrit 39.0 - 52.0 % 38.8  41.6  32.6   Platelets 150 - 400 K/uL 167  175  155     Lipid Panel No results for input(s): "CHOL", "TRIG", "LDLCALC", "VLDL", "HDL", "CHOLHDL", "LDLDIRECT" in the last 8760 hours.  HEMOGLOBIN A1C No results found for: "HGBA1C", "MPG" TSH No results for input(s): "TSH" in the last 8760 hours.  External labs:     Radiology:    Cardiac Studies:   01/29/2022  Echocardiogram IMPRESSIONS   1. Left ventricular ejection fraction, by  estimation, is 55 to 60%. The left ventricle has normal function. The left ventricle has no regional wall motion abnormalities. Left ventricular diastolic parameters are consistent with Grade II diastolic dysfunction (pseudonormalization).   2. Right ventricular systolic function is normal. The right ventricular size is mildly enlarged. There is mildly elevated pulmonary artery systolic pressure. The estimated right ventricular systolic pressure is 36.1 mmHg.   3. The mitral valve is grossly normal. Mild to moderate mitral valve regurgitation. No evidence of mitral stenosis.   4. The tricuspid valve is abnormal. Tricuspid valve regurgitation is moderate.   5. The aortic valve is tricuspid. There is mild thickening of the aortic valve. Aortic valve regurgitation is trivial. No aortic stenosis is present.   6. There is mild dilatation of the ascending aorta, measuring 42 mm.   7. The inferior vena cava is normal in size with <50% respiratory variability, suggesting right atrial pressure of 8 mmHg.   Exercise nuclear stress test 03/20/2022 Myocardial perfusion is normal. Low risk study Overall LV systolic function is normal without regional wall motion abnormalities. Stress LV EF: 67%.  Normal ECG stress. The patient exercised for 5 minutes and 48 seconds of a Bruce protocol, achieving approximately 7.05 METs and reached 95% MPHR. The heart rate response was normal. The blood pressure response was normal. No previous exam available for comparison.    EKG:   02/27/2022 normal sinus rhythm, single PVC, normal R wave progression, normal axis, no evidence of ischemia  Assessment     ICD-10-CM   1. Palpitations  R00.2 EKG 12-Lead    2. Essential hypertension  I10     3. Mixed hyperlipidemia  E78.2        Orders Placed This Encounter  Procedures   EKG 12-Lead    Meds ordered this encounter  Medications   lisinopril (ZESTRIL) 20 MG tablet    Sig: Take 1 tablet (20 mg total) by mouth at  bedtime.    Dispense:  90 tablet    Refill:  3    Medications Discontinued During This Encounter  Medication Reason   lisinopril (ZESTRIL) 5 MG tablet      Recommendations:   Jared [name] is a 76 y.o.  M with HTN, HLD, and VHD   Palpitations Stress test negative for ischemia   Essential hypertension Continue current cardiac medications Will increase lisinopril as BP still uncontrolled Encouraged DASH diet   DOE (dyspnea on exertion) Echocardiogram results reviewed with the patient Discussed heart failure symptoms to be cognizant of   Follow-up in 6 months or sooner if needed     Clotilde Dieter, DO, [facility]  03/27/2022, 10:05 AM Office: [phone] Pager: [phone]

## 2022-04-10 ENCOUNTER — Other Ambulatory Visit: Payer: Self-pay

## 2022-04-10 ENCOUNTER — Telehealth: Payer: Self-pay

## 2022-04-10 NOTE — Telephone Encounter (Signed)
yes

## 2022-04-10 NOTE — Telephone Encounter (Signed)
Patient called and is having side effects from lisinopril. Is he able to half it?

## 2022-04-15 ENCOUNTER — Other Ambulatory Visit: Payer: Self-pay | Admitting: Urology

## 2022-06-04 DIAGNOSIS — J309 Allergic rhinitis, unspecified: Secondary | ICD-10-CM | POA: Diagnosis not present

## 2022-06-22 ENCOUNTER — Other Ambulatory Visit: Payer: Self-pay

## 2022-06-22 MED ORDER — LISINOPRIL 20 MG PO TABS: 20.0000 mg | ORAL_TABLET | Freq: Every day | ORAL | 3 refills | Status: DC

## 2022-06-26 DIAGNOSIS — D709 Neutropenia, unspecified: Secondary | ICD-10-CM | POA: Diagnosis not present

## 2022-06-26 DIAGNOSIS — R7303 Prediabetes: Secondary | ICD-10-CM | POA: Diagnosis not present

## 2022-06-26 DIAGNOSIS — I519 Heart disease, unspecified: Secondary | ICD-10-CM | POA: Diagnosis not present

## 2022-06-26 DIAGNOSIS — N1831 Chronic kidney disease, stage 3a: Secondary | ICD-10-CM | POA: Diagnosis not present

## 2022-06-26 DIAGNOSIS — E7800 Pure hypercholesterolemia, unspecified: Secondary | ICD-10-CM | POA: Diagnosis not present

## 2022-06-26 DIAGNOSIS — D649 Anemia, unspecified: Secondary | ICD-10-CM | POA: Diagnosis not present

## 2022-06-26 DIAGNOSIS — I739 Peripheral vascular disease, unspecified: Secondary | ICD-10-CM | POA: Diagnosis not present

## 2022-06-26 DIAGNOSIS — I10 Essential (primary) hypertension: Secondary | ICD-10-CM | POA: Diagnosis not present

## 2022-07-04 DIAGNOSIS — H5703 Miosis: Secondary | ICD-10-CM | POA: Diagnosis not present

## 2022-07-04 DIAGNOSIS — Z961 Presence of intraocular lens: Secondary | ICD-10-CM | POA: Diagnosis not present

## 2022-07-04 DIAGNOSIS — H04123 Dry eye syndrome of bilateral lacrimal glands: Secondary | ICD-10-CM | POA: Diagnosis not present

## 2022-07-04 DIAGNOSIS — H2181 Floppy iris syndrome: Secondary | ICD-10-CM | POA: Diagnosis not present

## 2022-07-04 DIAGNOSIS — H401134 Primary open-angle glaucoma, bilateral, indeterminate stage: Secondary | ICD-10-CM | POA: Diagnosis not present

## 2022-07-24 DIAGNOSIS — D649 Anemia, unspecified: Secondary | ICD-10-CM | POA: Diagnosis not present

## 2022-07-24 DIAGNOSIS — D709 Neutropenia, unspecified: Secondary | ICD-10-CM | POA: Diagnosis not present

## 2022-07-24 DIAGNOSIS — I10 Essential (primary) hypertension: Secondary | ICD-10-CM | POA: Diagnosis not present

## 2022-07-24 DIAGNOSIS — N1831 Chronic kidney disease, stage 3a: Secondary | ICD-10-CM | POA: Diagnosis not present

## 2022-09-25 ENCOUNTER — Ambulatory Visit: Payer: Medicare Other | Admitting: Internal Medicine

## 2022-09-25 ENCOUNTER — Encounter: Payer: Self-pay | Admitting: Internal Medicine

## 2022-09-25 VITALS — BP 140/68 | HR 61 | Ht 71.0 in | Wt 187.0 lb

## 2022-09-25 DIAGNOSIS — R0609 Other forms of dyspnea: Secondary | ICD-10-CM | POA: Diagnosis not present

## 2022-09-25 DIAGNOSIS — E782 Mixed hyperlipidemia: Secondary | ICD-10-CM

## 2022-09-25 DIAGNOSIS — I340 Nonrheumatic mitral (valve) insufficiency: Secondary | ICD-10-CM

## 2022-09-25 DIAGNOSIS — I10 Essential (primary) hypertension: Secondary | ICD-10-CM

## 2022-09-25 NOTE — Progress Notes (Signed)
Primary Physician/Referring:  Soundra Pilon, FNP  Patient ID: Jared [name], male    DOB: 1945-07-13, 77 y.o.   MRN: [id number]  Chief Complaint  Patient presents with   Palpitations   Follow-up    HPI:    Jared [name]  is a 77 y.o. male with past medical history significant for hypertension, hyperlipidemia, and valvular heart disease who is here for a follow-up visit. He has been doing well since I last saw him. He has lost some weight due to gardening and playing golf regularly. He no longer has any shortness of breath with exertion. He is not limited in any of his activities. He does check his blood pressure at home and it runs around 130/70s every time he checks it. He is just a little nervous at the doctors office so his BP is a little elevated compared to his usual. He is tolerating his medications without issues. Denies chest pain, shortness of breath, diaphoresis, syncope, edema, PND, orthopnea.   Past Medical History:  Diagnosis Date   Arthritis    knees and back   Enlarged prostate    takes Flomax   Headache    Hypertension    stress test done about 12 yrs ago - results wnl per pt.   Past Surgical History:  Procedure Laterality Date   BACK SURGERY  2002   JOINT REPLACEMENT     KNEE ARTHROSCOPY Left 05/2004   Hattie Perch 09/27/2010   SHOULDER ARTHROSCOPY WITH OPEN ROTATOR CUFF REPAIR Right 2009   /notes 09/13/2010   TOTAL HIP ARTHROPLASTY Left 10/02/2017   TOTAL HIP ARTHROPLASTY Left 10/02/2017   Procedure: LEFT TOTAL HIP ARTHROPLASTY ANTERIOR APPROACH;  Surgeon: Gean Birchwood, MD;  Location: [facility];  Service: Orthopedics;  Laterality: Left;   TOTAL KNEE ARTHROPLASTY  04/16/2011   Procedure: TOTAL KNEE ARTHROPLASTY;  Surgeon: Nestor Lewandowsky;  Location: [facility];  Service: Orthopedics;  Laterality: Left;  LEFT TOTAL KNEE ARTHROPLASTY    TOTAL KNEE ARTHROPLASTY Right 04/19/2014   Procedure: RIGHT TOTAL KNEE ARTHROPLASTY;  Surgeon: Nestor Lewandowsky, MD;  Location: [facility];  Service:  Orthopedics;  Laterality: Right;   Family History  Problem Relation Age of Onset   Migraines Mother    Cancer Sister    Cancer Sister     Social History   Tobacco Use   Smoking status: Former    Packs/day: 1.00    Years: 10.00    Additional pack years: 0.00    Total pack years: 10.00    Types: Cigarettes    Quit date: 09/07/1974    Years since quitting: 48.0   Smokeless tobacco: Never  Substance Use Topics   Alcohol use: No    Comment: quit 1976 - beer   Marital Status: Married  ROS  Review of Systems  Cardiovascular:  Negative for chest pain, dyspnea on exertion and palpitations.  Respiratory:  Negative for shortness of breath.    Objective  Blood pressure (!) 140/68, pulse 61, height 5\' 11"  (1.803 m), weight 187 lb (84.8 kg), SpO2 100 %. Body mass index is 26.08 kg/m.     09/25/2022    9:26 AM 03/27/2022    9:24 AM 03/27/2022    9:21 AM  Vitals with BMI  Height 5\' 11"   5\' 11"   Weight 187 lbs  196 lbs  BMI 26.09  27.35  Systolic 140 163 409  Diastolic 68 83 76  Pulse 61 60 65     Physical Exam Vitals  and nursing note reviewed.  HENT:     Head: Normocephalic and atraumatic.  Neck:     Vascular: No carotid bruit.  Cardiovascular:     Rate and Rhythm: Normal rate and regular rhythm.     Pulses: Normal pulses.     Heart sounds: Murmur heard.  Pulmonary:     Effort: Pulmonary effort is normal.     Breath sounds: Normal breath sounds.  Abdominal:     General: Bowel sounds are normal.  Musculoskeletal:     Right lower leg: No edema.     Left lower leg: No edema.  Skin:    General: Skin is warm and dry.  Neurological:     Mental Status: He is alert.    Medications and allergies  No Known Allergies   Medication list after today's encounter   Current Outpatient Medications:    amLODipine (NORVASC) 10 MG tablet, Take 10 mg by mouth daily., Disp: , Rfl:    atorvastatin (LIPITOR) 10 MG tablet, Take 10 mg by mouth daily., Disp: , Rfl:    brimonidine  (ALPHAGAN) 0.2 % ophthalmic solution, Place 1 drop into both eyes 2 (two) times daily. , Disp: , Rfl:    dorzolamide-timolol (COSOPT) 22.3-6.8 MG/ML ophthalmic solution, Place 1 drop into both eyes 2 (two) times daily., Disp: , Rfl: 11   latanoprost (XALATAN) 0.005 % ophthalmic solution, Place 1 drop into both eyes at bedtime., Disp: , Rfl:    lisinopril (ZESTRIL) 20 MG tablet, Take 1 tablet (20 mg total) by mouth at bedtime., Disp: 90 tablet, Rfl: 3   Multiple Vitamins-Minerals (MULTIVITAMINS THER. W/MINERALS) TABS, Take 1 tablet by mouth daily.  , Disp: , Rfl:    tamsulosin (FLOMAX) 0.4 MG CAPS capsule, Take 0.4 mg by mouth daily after supper. , Disp: , Rfl:   Laboratory examination:   Lab Results  Component Value Date   NA 136 10/03/2017   K 4.0 10/03/2017   CO2 25 10/03/2017   GLUCOSE 163 (H) 10/03/2017   BUN 14 10/03/2017   CREATININE 1.34 (H) 10/03/2017   CALCIUM 8.7 (L) 10/03/2017   GFRNONAA 51 (L) 10/03/2017       Latest Ref Rng & Units 10/03/2017    3:52 AM 09/20/2017    9:46 AM 04/13/2014    9:27 AM  CMP  Glucose 65 - 99 mg/dL 161  86  94   BUN 6 - 20 mg/dL 14  12  13    Creatinine 0.61 - 1.24 mg/dL 0.96  0.45  4.09   Sodium 135 - 145 mmol/L 136  143  139   Potassium 3.5 - 5.1 mmol/L 4.0  4.0  4.2   Chloride 101 - 111 mmol/L 102  110  101   CO2 22 - 32 mmol/L 25  26  26    Calcium 8.9 - 10.3 mg/dL 8.7  9.4  9.2       Latest Ref Rng & Units 10/03/2017    3:52 AM 09/20/2017    9:46 AM 04/21/2014    3:34 AM  CBC  WBC 4.0 - 10.5 K/uL 9.5  4.0  11.1   Hemoglobin 13.0 - 17.0 g/dL 81.1  91.4  78.2   Hematocrit 39.0 - 52.0 % 38.8  41.6  32.6   Platelets 150 - 400 K/uL 167  175  155     Lipid Panel No results for input(s): "CHOL", "Jared", "LDLCALC", "VLDL", "HDL", "CHOLHDL", "LDLDIRECT" in the last 8760 hours.  HEMOGLOBIN A1C No results found for: "  HGBA1C", "MPG" TSH No results for input(s): "TSH" in the last 8760 hours.  External labs:     Radiology:     Cardiac Studies:   01/29/2022 Echocardiogram IMPRESSIONS   1. Left ventricular ejection fraction, by estimation, is 55 to 60%. The left ventricle has normal function. The left ventricle has no regional wall motion abnormalities. Left ventricular diastolic parameters are consistent with Grade II diastolic dysfunction (pseudonormalization).   2. Right ventricular systolic function is normal. The right ventricular size is mildly enlarged. There is mildly elevated pulmonary artery systolic pressure. The estimated right ventricular systolic pressure is 36.1 mmHg.   3. The mitral valve is grossly normal. Mild to moderate mitral valve regurgitation. No evidence of mitral stenosis.   4. The tricuspid valve is abnormal. Tricuspid valve regurgitation is moderate.   5. The aortic valve is tricuspid. There is mild thickening of the aortic valve. Aortic valve regurgitation is trivial. No aortic stenosis is present.   6. There is mild dilatation of the ascending aorta, measuring 42 mm.   7. The inferior vena cava is normal in size with <50% respiratory variability, suggesting right atrial pressure of 8 mmHg.   Exercise nuclear stress test 03/20/2022 Myocardial perfusion is normal. Low risk study Overall LV systolic function is normal without regional wall motion abnormalities. Stress LV EF: 67%.  Normal ECG stress. The patient exercised for 5 minutes and 48 seconds of a Bruce protocol, achieving approximately 7.05 METs and reached 95% MPHR. The heart rate response was normal. The blood pressure response was normal. No previous exam available for comparison.    EKG:   02/27/2022 normal sinus rhythm, single PVC, normal R wave progression, normal axis, no evidence of ischemia  Assessment     ICD-10-CM   1. Nonrheumatic mitral valve regurgitation  I34.0 PCV ECHOCARDIOGRAM COMPLETE    2. DOE (dyspnea on exertion)  R06.09     3. Essential hypertension  I10     4. Mixed hyperlipidemia  E78.2         Orders Placed This Encounter  Procedures   PCV ECHOCARDIOGRAM COMPLETE    Standing Status:   Future    Standing Expiration Date:   09/25/2023    No orders of the defined types were placed in this encounter.   There are no discontinued medications.    Recommendations:   Jared [name] is a 77 y.o.  M with HTN, HLD, and VHD   Mitral regurgitation Mild-moderate MR on echo Currently patient is asymptomatic Will repeat echo before next follow up visit   Mixed hyperlipidemia Continue lipitor PCP following lipids   Essential hypertension Continue current cardiac medications BP well controlled at home, runs 130s/70s Encouraged DASH diet   DOE (dyspnea on exertion) Patient has host some weight and is no longer having DOE Stress test negative for ischemia  Follow-up in 6 months or sooner if needed     Clotilde Dieter, DO, [facility]  09/25/2022, 9:51 AM Office: [phone] Pager: [phone]

## 2022-10-24 DIAGNOSIS — Z9889 Other specified postprocedural states: Secondary | ICD-10-CM | POA: Diagnosis not present

## 2022-10-24 DIAGNOSIS — H401134 Primary open-angle glaucoma, bilateral, indeterminate stage: Secondary | ICD-10-CM | POA: Diagnosis not present

## 2022-11-09 DIAGNOSIS — D219 Benign neoplasm of connective and other soft tissue, unspecified: Secondary | ICD-10-CM | POA: Diagnosis not present

## 2022-11-12 DIAGNOSIS — R2242 Localized swelling, mass and lump, left lower limb: Secondary | ICD-10-CM | POA: Diagnosis not present

## 2023-01-17 DIAGNOSIS — D709 Neutropenia, unspecified: Secondary | ICD-10-CM | POA: Diagnosis not present

## 2023-01-17 DIAGNOSIS — E7800 Pure hypercholesterolemia, unspecified: Secondary | ICD-10-CM | POA: Diagnosis not present

## 2023-01-17 DIAGNOSIS — J309 Allergic rhinitis, unspecified: Secondary | ICD-10-CM | POA: Diagnosis not present

## 2023-01-17 DIAGNOSIS — I10 Essential (primary) hypertension: Secondary | ICD-10-CM | POA: Diagnosis not present

## 2023-01-17 DIAGNOSIS — R7301 Impaired fasting glucose: Secondary | ICD-10-CM | POA: Diagnosis not present

## 2023-01-17 DIAGNOSIS — I519 Heart disease, unspecified: Secondary | ICD-10-CM | POA: Diagnosis not present

## 2023-01-17 DIAGNOSIS — D649 Anemia, unspecified: Secondary | ICD-10-CM | POA: Diagnosis not present

## 2023-01-17 DIAGNOSIS — Z0000 Encounter for general adult medical examination without abnormal findings: Secondary | ICD-10-CM | POA: Diagnosis not present

## 2023-01-17 DIAGNOSIS — N1831 Chronic kidney disease, stage 3a: Secondary | ICD-10-CM | POA: Diagnosis not present

## 2023-02-22 ENCOUNTER — Other Ambulatory Visit: Payer: Medicare Other

## 2023-02-23 DIAGNOSIS — R1084 Generalized abdominal pain: Secondary | ICD-10-CM | POA: Diagnosis not present

## 2023-02-23 DIAGNOSIS — K5732 Diverticulitis of large intestine without perforation or abscess without bleeding: Secondary | ICD-10-CM | POA: Diagnosis not present

## 2023-03-11 DIAGNOSIS — R21 Rash and other nonspecific skin eruption: Secondary | ICD-10-CM | POA: Diagnosis not present

## 2023-03-11 DIAGNOSIS — B3749 Other urogenital candidiasis: Secondary | ICD-10-CM | POA: Diagnosis not present

## 2023-03-18 DIAGNOSIS — H401134 Primary open-angle glaucoma, bilateral, indeterminate stage: Secondary | ICD-10-CM | POA: Diagnosis not present

## 2023-03-18 DIAGNOSIS — H5703 Miosis: Secondary | ICD-10-CM | POA: Diagnosis not present

## 2023-03-18 DIAGNOSIS — H2181 Floppy iris syndrome: Secondary | ICD-10-CM | POA: Diagnosis not present

## 2023-03-18 DIAGNOSIS — H04123 Dry eye syndrome of bilateral lacrimal glands: Secondary | ICD-10-CM | POA: Diagnosis not present

## 2023-03-25 ENCOUNTER — Ambulatory Visit: Payer: Self-pay | Admitting: Cardiology

## 2023-03-26 ENCOUNTER — Ambulatory Visit: Payer: Medicare Other | Admitting: Internal Medicine

## 2023-04-01 DIAGNOSIS — H2181 Floppy iris syndrome: Secondary | ICD-10-CM | POA: Diagnosis not present

## 2023-04-01 DIAGNOSIS — H5703 Miosis: Secondary | ICD-10-CM | POA: Diagnosis not present

## 2023-04-01 DIAGNOSIS — Z961 Presence of intraocular lens: Secondary | ICD-10-CM | POA: Diagnosis not present

## 2023-04-01 DIAGNOSIS — H04123 Dry eye syndrome of bilateral lacrimal glands: Secondary | ICD-10-CM | POA: Diagnosis not present

## 2023-04-01 DIAGNOSIS — H401134 Primary open-angle glaucoma, bilateral, indeterminate stage: Secondary | ICD-10-CM | POA: Diagnosis not present

## 2023-04-03 DIAGNOSIS — H04123 Dry eye syndrome of bilateral lacrimal glands: Secondary | ICD-10-CM | POA: Diagnosis not present

## 2023-04-03 DIAGNOSIS — H401134 Primary open-angle glaucoma, bilateral, indeterminate stage: Secondary | ICD-10-CM | POA: Diagnosis not present

## 2023-04-03 DIAGNOSIS — H5703 Miosis: Secondary | ICD-10-CM | POA: Diagnosis not present

## 2023-04-03 DIAGNOSIS — H2181 Floppy iris syndrome: Secondary | ICD-10-CM | POA: Diagnosis not present

## 2023-04-03 DIAGNOSIS — Z961 Presence of intraocular lens: Secondary | ICD-10-CM | POA: Diagnosis not present

## 2023-04-09 ENCOUNTER — Ambulatory Visit (HOSPITAL_COMMUNITY): Payer: Medicare Other | Attending: Internal Medicine

## 2023-04-09 DIAGNOSIS — I340 Nonrheumatic mitral (valve) insufficiency: Secondary | ICD-10-CM | POA: Insufficient documentation

## 2023-04-09 LAB — ECHOCARDIOGRAM COMPLETE
Area-P 1/2: 2.66 cm2
P 1/2 time: 550 ms
S' Lateral: 3 cm

## 2023-04-17 ENCOUNTER — Ambulatory Visit: Payer: Medicare Other | Attending: Internal Medicine | Admitting: Cardiology

## 2023-04-17 ENCOUNTER — Encounter: Payer: Self-pay | Admitting: Cardiology

## 2023-04-17 VITALS — BP 110/68 | HR 60 | Resp 16 | Ht 71.0 in | Wt 185.0 lb

## 2023-04-17 DIAGNOSIS — R079 Chest pain, unspecified: Secondary | ICD-10-CM | POA: Diagnosis not present

## 2023-04-17 DIAGNOSIS — R0609 Other forms of dyspnea: Secondary | ICD-10-CM

## 2023-04-17 DIAGNOSIS — R0683 Snoring: Secondary | ICD-10-CM | POA: Diagnosis not present

## 2023-04-17 DIAGNOSIS — D649 Anemia, unspecified: Secondary | ICD-10-CM | POA: Diagnosis not present

## 2023-04-17 DIAGNOSIS — Z01812 Encounter for preprocedural laboratory examination: Secondary | ICD-10-CM | POA: Diagnosis not present

## 2023-04-17 DIAGNOSIS — I340 Nonrheumatic mitral (valve) insufficiency: Secondary | ICD-10-CM

## 2023-04-17 DIAGNOSIS — I2729 Other secondary pulmonary hypertension: Secondary | ICD-10-CM

## 2023-04-17 MED ORDER — NITROGLYCERIN 0.4 MG SL SUBL: 0.4000 mg | SUBLINGUAL_TABLET | SUBLINGUAL | 5 refills | Status: DC | PRN

## 2023-04-17 MED ORDER — ASPIRIN 81 MG PO TBEC: 81.0000 mg | DELAYED_RELEASE_TABLET | Freq: Every day | ORAL | 1 refills | Status: DC

## 2023-04-17 NOTE — Progress Notes (Signed)
Cardiology Office Note:  .   Date:  04/17/2023  ID:  Jared [name], DOB [birth year], MRN [id number] PCP: Soundra Pilon, FNP  [facility] Providers Cardiologist:  Truett Mainland, MD PCP: Soundra Pilon, FNP  Chief Complaint  Patient presents with   Nonrheumatic mitral valve regurgitation   Follow-up      History of Present Illness: .    Jared [name] is a 77 y.o. male with hypertension, hyperlipidemia, mod TR, mild PH, aortic root dilatation, PAD, anemia, mild PH, with exertional dyspnea, chest pain  Patient was last seen by Dr. Melton Alar in 02/2022.  I reviewed prior notes, lab results, echocardiogram and stress test results in detail.    Patient has continued to have exertional dyspnea with minimal activity, such as walking from parking lot to today's visit.  His walking is significantly limited due to exertional dyspnea.  In addition, he also reports exertional chest pain lasting for few minutes, improved with rest.  He denies any orthopnea, PND, leg edema symptoms.  Reviewed recent echocardiogram results with the patient, details below.  Patient was noted to have a hemoglobin of 10.6 on 9 September, but does not appear that he has undergone any workup for anemia.  Vitals:   04/17/23 0907  BP: 110/68  Pulse: 60  Resp: 16  SpO2: 95%     ROS:  Review of Systems  Cardiovascular:  Positive for chest pain and dyspnea on exertion. Negative for leg swelling, palpitations and syncope.  Respiratory:  Positive for snoring.      Studies Reviewed: Marland Kitchen       EKG 04/17/2023: Sinus rhythm with Premature atrial complexes When compared with ECG of 20-Sep-2017 09:53, Premature atrial complexes are now Present    Echocardiogram 04/09/2023: 1. Left ventricular ejection fraction, by estimation, is 60 to 65%. Left  ventricular ejection fraction by 3D volume is 64 %. The left ventricle has  normal function. The left ventricle has no regional wall motion  abnormalities. Left  ventricular diastolic  parameters were normal. The average left ventricular global longitudinal strain is -25.7 %. The global longitudinal strain is normal.   2. Right ventricular systolic function is normal. The right ventricular  size is mildly enlarged. There is normal pulmonary artery systolic  pressure. The estimated right ventricular systolic pressure is 34.6 mmHg.   3. Right atrial size was mildly dilated.   4. The mitral valve is grossly normal. Mild to moderate mitral valve  regurgitation. No evidence of mitral stenosis.   5. Tricuspid valve regurgitation is moderate.   6. The aortic valve is tricuspid. Aortic valve regurgitation is trivial.  No aortic stenosis is present.   7. There is mild dilatation of the ascending aorta, measuring 42 mm.   8. The inferior vena cava is normal in size with greater than 50%  respiratory variability, suggesting right atrial pressure of 3 mmHg.   Comparison(s): No significant change from prior study.   Independently interpreted Labs 01/17/2023: Chol 107, TG 51, HDL 57, LDL 45 HbA1C 5.5% Hb 10.6 Cr NA, K 3.8 BNP 123 (12/2021)    Physical Exam:   Physical Exam Vitals and nursing note reviewed.  Constitutional:      General: He is not in acute distress. Neck:     Vascular: No JVD.  Cardiovascular:     Rate and Rhythm: Normal rate and regular rhythm.     Heart sounds: Murmur heard.     High-pitched blowing holosystolic murmur is present with a  grade of 2/6 at the apex.  Pulmonary:     Effort: Pulmonary effort is normal.     Breath sounds: Normal breath sounds. No wheezing or rales.  Musculoskeletal:     Right lower leg: No edema.     Left lower leg: No edema.      VISIT DIAGNOSES:   ICD-10-CM   1. Nonrheumatic mitral valve regurgitation  I34.0 EKG 12-Lead    2. Exertional dyspnea  R06.09     3. Exertional chest pain  R07.9     4. Snoring  R06.83     5. 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 Anemia,  unspecified type  D64.9        ASSESSMENT AND PLAN: .    Jared [name] is a 77 y.o. male with hypertension, hyperlipidemia, mod TR, mild PH, aortic root dilatation, PAD, anemia, mild PH, with exertional dyspnea, chest pain  Exertional chest pain, dyspnea: Persistent symptoms, gradually progressive and lifestyle limiting. Etiology could be multifactorial, including anemia, mild pulmonary hypertension, as well as possibility of obstructive coronary artery disease. Stress test in 03/2022 did not show any ischemia, however his symptoms remain. He does have mild PAD, which is risk factor for CAD. Recommend right and left heart catheterization extremity angiogram for definitive coronary artery evaluation, as well as evaluate for pulmonary hypertension-mild pulmonary hypertension noted on echocardiogram. Unless any critical stenosis found, I would likely not perform coronary artery stenting at the same time, pending workup for anemia, as well as trial of medical therapy, if necessary. Added aspirin 81 mg daily. Continue statin, lipids well-controlled.  Anemia: Hemoglobin 10.6 in 01/2023. Previously, hemoglobin has been around 12.  His last colonoscopy was about 5 years ago.  He has always had few polyps noted on colonoscopy. Defer workup to primary care provider, with whom he has appointment next month.  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 Heart rate and blood pressure well-controlled. Repeat echocardiogram in 1 year (can be ordered at subsequent visits.  Snoring: Given his snoring reported by patient and his wife, mild pulmonary hypertension with moderate TR noted on echocardiogram, recommend sleep study for evaluation of possible OSA.  I spent 15 minutes the day of her visit reviewing her records- I reviewed prior notes, lab results, echocardiogram and stress test results in detail; 20 minutes talking with the patient about her history and in face-to-face counseling and 10 minutes in  adjusting medication and discussing future test and procedures, for a total of 45 minutes spent in patient care.  Informed Consent   Shared Decision Making/Informed Consent The risks [stroke (1 in 1000), death (1 in 1000), kidney failure [usually temporary] (1 in 500), bleeding (1 in 200), allergic reaction [possibly serious] (1 in 200)], benefits (diagnostic support and management of coronary artery disease) and alternatives of a cardiac catheterization were discussed in detail with Mr. Space and he is willing to proceed.       No orders of the defined types were placed in this encounter.    F/u after heart catheterization  Signed, Elder Negus, MD

## 2023-04-17 NOTE — Patient Instructions (Signed)
Medication Instructions:   START TAKING ASPIRIN 81 MG BY MOUTH DAILY  START TAKING NITROGLYCERIN 0.4 MG SUBLINGUAL (UNDER THE TONGUE)--PLACE ONE TABLET UNDER THE TONGUE EVERY 5 MINUTES AS NEEDED FOR CHEST PAIN--IF YOU ARE GETTING TO TABLET #3 WITH NO CHEST PAIN RELIEF, PLEASE CALL 911  *If you need a refill on your cardiac medications before your next appointment, please call your pharmacy*   Lab Work:  TODAY--BMET, CBC W DIFF, AND PRO-BNP  If you have labs (blood work) drawn today and your tests are completely normal, you will receive your results only by: MyChart Message (if you have MyChart) OR A paper copy in the mail If you have any lab test that is abnormal or we need to change your treatment, we will call you to review the results.   Testing/Procedures:  Your physician has recommended that you have a SPLIT NIGHT sleep study. This test records several body functions during sleep, including: brain activity, eye movement, oxygen and carbon dioxide blood levels, heart rate and rhythm, breathing rate and rhythm, the flow of air through your mouth and nose, snoring, body muscle movements, and chest and belly movement. OUR SLEEP STUDY TEAM AND COORDINATOR WILL CALL YOU SOON TO MAKE ARRANGEMENTS TO HAVE YOUR SLEEP STUDY SCHEDULED.          Cardiac/Peripheral Catheterization   You are scheduled for a Cardiac Catheterization on Tuesday, December 10 with Dr.  Rosemary Holms .  1. Please arrive at the [facility] (Main Entrance A) at [facility]: [address] [place], [place] [address] at 7:00 AM (This time is 2 hour(s) before your procedure to ensure your preparation).   Free valet parking service is available. You will check in at ADMITTING. The support person will be asked to wait in the waiting room.  It is OK to have someone drop you off and come back when you are ready to be discharged.        Special note: Every effort is made to have your procedure done on time. Please  understand that emergencies sometimes delay scheduled procedures.  2. Diet: Do not eat solid foods after midnight.  You may have clear liquids until 5 AM the day of the procedure.  3. Labs: You will need to have blood drawn on Wednesday, December 4 at [facility]. [address], [place]  Open: 7:30am - 5pm    Phone: [phone]. You do not need to be fasting.  4. Medication instructions in preparation for your procedure:   Contrast Allergy: No    Stop taking, Lisinopril (Zestril or Prinivil) Tuesday, December 10,   On the morning of your procedure, take Aspirin 81 mg and any morning medicines NOT listed above.  You may use sips of water.  5. Plan to go home the same day, you will only stay overnight if medically necessary. 6. You MUST have a responsible adult to drive you home. 7. An adult MUST be with you the first 24 hours after you arrive home. 8. Bring a current list of your medications, and the last time and date medication taken. 9. Bring ID and current insurance cards. 10.Please wear clothes that are easy to get on and off and wear slip-on shoes.  Thank you for allowing [place] to care for you!   --  Invasive Cardiovascular services   Follow-Up:  1.) ONE MONTH WITH AN EXTENDER IN THE OFFICE  2.) 6 MONTHS WITH DR. PATWARDHAN

## 2023-04-18 LAB — CBC WITH DIFFERENTIAL/PLATELET
Basophils Absolute: 0 10*3/uL (ref 0.0–0.2)
Basos: 1 %
EOS (ABSOLUTE): 1.6 10*3/uL — ABNORMAL HIGH (ref 0.0–0.4)
Eos: 35 %
Hematocrit: 30.9 % — ABNORMAL LOW (ref 37.5–51.0)
Hemoglobin: 10 g/dL — ABNORMAL LOW (ref 13.0–17.7)
Immature Grans (Abs): 0.1 10*3/uL (ref 0.0–0.1)
Immature Granulocytes: 1 %
Lymphocytes Absolute: 1.9 10*3/uL (ref 0.7–3.1)
Lymphs: 41 %
MCH: 33.2 pg — ABNORMAL HIGH (ref 26.6–33.0)
MCHC: 32.4 g/dL (ref 31.5–35.7)
MCV: 103 fL — ABNORMAL HIGH (ref 79–97)
Monocytes Absolute: 0.2 10*3/uL (ref 0.1–0.9)
Monocytes: 4 %
NRBC: 1 % — ABNORMAL HIGH (ref 0–0)
Neutrophils Absolute: 0.8 10*3/uL — ABNORMAL LOW (ref 1.4–7.0)
Neutrophils: 18 %
Platelets: 134 10*3/uL — ABNORMAL LOW (ref 150–450)
RBC: 3.01 x10E6/uL — ABNORMAL LOW (ref 4.14–5.80)
RDW: 21.5 % — ABNORMAL HIGH (ref 11.6–15.4)
WBC: 4.6 10*3/uL (ref 3.4–10.8)

## 2023-04-18 LAB — BASIC METABOLIC PANEL
BUN/Creatinine Ratio: 11 (ref 10–24)
BUN: 13 mg/dL (ref 8–27)
CO2: 24 mmol/L (ref 20–29)
Calcium: 9.3 mg/dL (ref 8.6–10.2)
Chloride: 105 mmol/L (ref 96–106)
Creatinine, Ser: 1.17 mg/dL (ref 0.76–1.27)
Glucose: 89 mg/dL (ref 70–99)
Potassium: 4.2 mmol/L (ref 3.5–5.2)
Sodium: 139 mmol/L (ref 134–144)
eGFR: 63 mL/min/{1.73_m2} (ref >59)

## 2023-04-18 LAB — PRO B NATRIURETIC PEPTIDE: NT-Pro BNP: 635 pg/mL — ABNORMAL HIGH (ref 0–486)

## 2023-04-22 ENCOUNTER — Telehealth: Payer: Self-pay | Admitting: *Deleted

## 2023-04-22 NOTE — Telephone Encounter (Signed)
Reviewed procedure instructions with patient.  

## 2023-04-22 NOTE — Telephone Encounter (Signed)
Pt is returning your call

## 2023-04-22 NOTE — Telephone Encounter (Signed)
Cardiac Catheterization scheduled at [facility] for: Tuesday April 23, 2023 9 AM Arrival time [facility] Main Entrance A at: 7 AM  Nothing to eat after midnight prior to procedure, clear liquids until 5 AM day of procedure.  Medication instructions: -Usual morning medications can be taken with sips of water including aspirin 81 mg.  Plan to go home the same day, you will only stay overnight if medically necessary.  You must have responsible adult to drive you home.  Someone must be with you the first 24 hours after you arrive home.  Left message for patient to call back to review procedure instructions

## 2023-04-23 ENCOUNTER — Ambulatory Visit (HOSPITAL_COMMUNITY)
Admission: RE | Admit: 2023-04-23 | Discharge: 2023-04-23 | Disposition: A | Discharge status: Home / Self Care | Payer: Medicare Other | Attending: Cardiology | Admitting: Cardiology

## 2023-04-23 ENCOUNTER — Encounter (HOSPITAL_COMMUNITY)
Admission: RE | Disposition: A | Discharge status: Home / Self Care | Payer: Self-pay | Source: Home / Self Care | Attending: Cardiology

## 2023-04-23 ENCOUNTER — Other Ambulatory Visit (HOSPITAL_COMMUNITY): Payer: Self-pay

## 2023-04-23 ENCOUNTER — Encounter (HOSPITAL_COMMUNITY): Payer: Self-pay | Admitting: Cardiology

## 2023-04-23 ENCOUNTER — Other Ambulatory Visit: Payer: Self-pay

## 2023-04-23 DIAGNOSIS — E785 Hyperlipidemia, unspecified: Secondary | ICD-10-CM | POA: Diagnosis not present

## 2023-04-23 DIAGNOSIS — R0609 Other forms of dyspnea: Secondary | ICD-10-CM | POA: Insufficient documentation

## 2023-04-23 DIAGNOSIS — I2584 Coronary atherosclerosis due to calcified coronary lesion: Secondary | ICD-10-CM | POA: Diagnosis not present

## 2023-04-23 DIAGNOSIS — I272 Pulmonary hypertension, unspecified: Secondary | ICD-10-CM | POA: Diagnosis not present

## 2023-04-23 DIAGNOSIS — D649 Anemia, unspecified: Secondary | ICD-10-CM | POA: Diagnosis not present

## 2023-04-23 DIAGNOSIS — R079 Chest pain, unspecified: Secondary | ICD-10-CM | POA: Diagnosis present

## 2023-04-23 DIAGNOSIS — I2510 Atherosclerotic heart disease of native coronary artery without angina pectoris: Secondary | ICD-10-CM | POA: Diagnosis not present

## 2023-04-23 DIAGNOSIS — R0683 Snoring: Secondary | ICD-10-CM | POA: Diagnosis not present

## 2023-04-23 DIAGNOSIS — I10 Essential (primary) hypertension: Secondary | ICD-10-CM | POA: Diagnosis not present

## 2023-04-23 DIAGNOSIS — I77810 Thoracic aortic ectasia: Secondary | ICD-10-CM | POA: Diagnosis not present

## 2023-04-23 HISTORY — PX: RIGHT/LEFT HEART CATH AND CORONARY ANGIOGRAPHY: CATH118266

## 2023-04-23 HISTORY — PX: CORONARY PRESSURE/FFR STUDY: CATH118243

## 2023-04-23 LAB — POCT I-STAT 7, (LYTES, BLD GAS, ICA,H+H)
Acid-base deficit: 4 mmol/L — ABNORMAL HIGH (ref 0.0–2.0)
Bicarbonate: 20.9 mmol/L (ref 20.0–28.0)
Calcium, Ion: 1.24 mmol/L (ref 1.15–1.40)
HCT: 30 % — ABNORMAL LOW (ref 39.0–52.0)
Hemoglobin: 10.2 g/dL — ABNORMAL LOW (ref 13.0–17.0)
O2 Saturation: 95 %
Potassium: 3.9 mmol/L (ref 3.5–5.1)
Sodium: 141 mmol/L (ref 135–145)
TCO2: 22 mmol/L (ref 22–32)
pCO2 arterial: 35.5 mm[Hg] (ref 32–48)
pH, Arterial: 7.378 (ref 7.35–7.45)
pO2, Arterial: 78 mm[Hg] — ABNORMAL LOW (ref 83–108)

## 2023-04-23 LAB — POCT I-STAT EG7
Acid-base deficit: 2 mmol/L (ref 0.0–2.0)
Bicarbonate: 23.1 mmol/L (ref 20.0–28.0)
Calcium, Ion: 1.28 mmol/L (ref 1.15–1.40)
HCT: 31 % — ABNORMAL LOW (ref 39.0–52.0)
Hemoglobin: 10.5 g/dL — ABNORMAL LOW (ref 13.0–17.0)
O2 Saturation: 68 %
Potassium: 3.9 mmol/L (ref 3.5–5.1)
Sodium: 141 mmol/L (ref 135–145)
TCO2: 24 mmol/L (ref 22–32)
pCO2, Ven: 41.5 mm[Hg] — ABNORMAL LOW (ref 44–60)
pH, Ven: 7.353 (ref 7.25–7.43)
pO2, Ven: 37 mm[Hg] (ref 32–45)

## 2023-04-23 LAB — POCT ACTIVATED CLOTTING TIME
Activated Clotting Time: 216 s
Activated Clotting Time: 250 s

## 2023-04-23 SURGERY — RIGHT/LEFT HEART CATH AND CORONARY ANGIOGRAPHY
Anesthesia: LOCAL

## 2023-04-23 MED ORDER — LIDOCAINE HCL (PF) 1 % IJ SOLN
INTRAMUSCULAR | Status: AC
  Filled 2023-04-23: qty 30

## 2023-04-23 MED ORDER — SODIUM CHLORIDE 0.9 % WEIGHT BASED INFUSION
3.0000 mL/kg/h | INTRAVENOUS | Status: AC
  Administered 2023-04-23: 3 mL/kg/h via INTRAVENOUS

## 2023-04-23 MED ORDER — LABETALOL HCL 5 MG/ML IV SOLN: 10.0000 mg | INTRAVENOUS | Status: DC | PRN

## 2023-04-23 MED ORDER — FUROSEMIDE 20 MG PO TABS
20.0000 mg | ORAL_TABLET | Freq: Every day | ORAL | 3 refills | Status: DC
  Filled 2023-04-23: qty 30, 30d supply, fill #0

## 2023-04-23 MED ORDER — SODIUM CHLORIDE 0.9 % WEIGHT BASED INFUSION: 1.0000 mL/kg/h | INTRAVENOUS | Status: DC

## 2023-04-23 MED ORDER — LIDOCAINE HCL (PF) 1 % IJ SOLN
INTRAMUSCULAR | Status: DC | PRN
  Administered 2023-04-23: 5 mL via INTRADERMAL

## 2023-04-23 MED ORDER — NITROGLYCERIN 1 MG/10 ML FOR IR/CATH LAB
INTRA_ARTERIAL | Status: AC
  Filled 2023-04-23: qty 10

## 2023-04-23 MED ORDER — VERAPAMIL HCL 2.5 MG/ML IV SOLN
INTRAVENOUS | Status: AC
  Filled 2023-04-23: qty 2

## 2023-04-23 MED ORDER — VERAPAMIL HCL 2.5 MG/ML IV SOLN
INTRAVENOUS | Status: DC | PRN
  Administered 2023-04-23 (×2): 10 mL via INTRA_ARTERIAL

## 2023-04-23 MED ORDER — MIDAZOLAM HCL 2 MG/2ML IJ SOLN
INTRAMUSCULAR | Status: DC | PRN
  Administered 2023-04-23 (×2): 1 mg via INTRAVENOUS

## 2023-04-23 MED ORDER — IOHEXOL 350 MG/ML SOLN
INTRAVENOUS | Status: DC | PRN
  Administered 2023-04-23: 90 mL

## 2023-04-23 MED ORDER — MIDAZOLAM HCL 2 MG/2ML IJ SOLN
INTRAMUSCULAR | Status: AC
Start: 2023-04-23 — End: ?
  Filled 2023-04-23: qty 2

## 2023-04-23 MED ORDER — HEPARIN SODIUM (PORCINE) 1000 UNIT/ML IJ SOLN
INTRAMUSCULAR | Status: AC
  Filled 2023-04-23: qty 10

## 2023-04-23 MED ORDER — FENTANYL CITRATE (PF) 100 MCG/2ML IJ SOLN
INTRAMUSCULAR | Status: AC
  Filled 2023-04-23: qty 2

## 2023-04-23 MED ORDER — HEPARIN (PORCINE) IN NACL 1000-0.9 UT/500ML-% IV SOLN
INTRAVENOUS | Status: DC | PRN
  Administered 2023-04-23 (×2): 500 mL

## 2023-04-23 MED ORDER — HEPARIN SODIUM (PORCINE) 1000 UNIT/ML IJ SOLN
INTRAMUSCULAR | Status: DC | PRN
  Administered 2023-04-23: 4500 [IU] via INTRAVENOUS
  Administered 2023-04-23: 4000 [IU] via INTRAVENOUS
  Administered 2023-04-23: 3000 [IU] via INTRAVENOUS

## 2023-04-23 MED ORDER — HYDRALAZINE HCL 20 MG/ML IJ SOLN: 10.0000 mg | INTRAMUSCULAR | Status: DC | PRN

## 2023-04-23 MED ORDER — SODIUM CHLORIDE 0.9% FLUSH
3.0000 mL | INTRAVENOUS | Status: DC | PRN
Start: 2023-04-23 — End: 2023-04-23

## 2023-04-23 MED ORDER — SODIUM CHLORIDE 0.9% FLUSH: 3.0000 mL | Freq: Two times a day (BID) | INTRAVENOUS | Status: DC

## 2023-04-23 MED ORDER — SODIUM CHLORIDE 0.9 % IV SOLN
INTRAVENOUS | Status: AC
Start: 2023-04-23 — End: 2023-04-23

## 2023-04-23 MED ORDER — FENTANYL CITRATE (PF) 100 MCG/2ML IJ SOLN
INTRAMUSCULAR | Status: DC | PRN
  Administered 2023-04-23 (×2): 25 ug via INTRAVENOUS

## 2023-04-23 MED ORDER — ACETAMINOPHEN 325 MG PO TABS: 650.0000 mg | ORAL_TABLET | ORAL | Status: DC | PRN

## 2023-04-23 MED ORDER — VERAPAMIL HCL 2.5 MG/ML IV SOLN
INTRAVENOUS | Status: DC | PRN
  Administered 2023-04-23: 10 mL via INTRA_ARTERIAL

## 2023-04-23 MED ORDER — SODIUM CHLORIDE 0.9 % IV SOLN
250.0000 mL | INTRAVENOUS | Status: DC | PRN
Start: 2023-04-23 — End: 2023-04-23

## 2023-04-23 MED ORDER — ASPIRIN 81 MG PO CHEW: 81.0000 mg | CHEWABLE_TABLET | ORAL | Status: DC

## 2023-04-23 MED ORDER — ONDANSETRON HCL 4 MG/2ML IJ SOLN: 4.0000 mg | Freq: Four times a day (QID) | INTRAMUSCULAR | Status: DC | PRN

## 2023-04-23 SURGICAL SUPPLY — 16 items
CATH 5FR JL3.5 JR4 ANG PIG MP (CATHETERS) IMPLANT
CATH BALLN WEDGE 5F 110CM (CATHETERS) IMPLANT
CATH INFINITI 5FR JL4 (CATHETERS) IMPLANT
CATH INFINITI AMBI 5FR TG (CATHETERS) IMPLANT
CATH LAUNCHER 6FR EBU 3 (CATHETERS) IMPLANT
CATH LAUNCHER 6FR EBU3.5 (CATHETERS) IMPLANT
CATH LAUNCHER 6FR JL3.5 (CATHETERS) IMPLANT
DEVICE RAD COMP TR BAND LRG (VASCULAR PRODUCTS) IMPLANT
GLIDESHEATH SLEND SS 6F .021 (SHEATH) IMPLANT
GUIDEWIRE INQWIRE 1.5J.035X260 (WIRE) IMPLANT
GUIDEWIRE PRESSURE X 175 (WIRE) IMPLANT
INQWIRE 1.5J .035X260CM (WIRE) ×1
KIT HEMO VALVE WATCHDOG (MISCELLANEOUS) IMPLANT
PACK CARDIAC CATHETERIZATION (CUSTOM PROCEDURE TRAY) ×1 IMPLANT
SET ATX-X65L (MISCELLANEOUS) IMPLANT
SHEATH GLIDE SLENDER 4/5FR (SHEATH) IMPLANT

## 2023-04-23 NOTE — H&P (Signed)
OV 12/4 copied for documentation   Cardiology Office Note:  .   Date:  04/23/2023  ID:  Jared [name], DOB August 10, 1945, MRN [id number] PCP: Soundra Pilon, FNP   HeartCare Providers Cardiologist:  Truett Mainland, MD PCP: Soundra Pilon, FNP  C/C: Exertional dyspnea, chest pain   History of Present Illness: .    Jared [name] is a 77 y.o. male with hypertension, hyperlipidemia, mod TR, mild PH, aortic root dilatation, PAD, anemia, mild PH, with exertional dyspnea, chest pain  Patient was last seen by Dr. Melton Alar in 02/2022.  I reviewed prior notes, lab results, echocardiogram and stress test results in detail.    Patient has continued to have exertional dyspnea with minimal activity, such as walking from parking lot to today's visit.  His walking is significantly limited due to exertional dyspnea.  In addition, he also reports exertional chest pain lasting for few minutes, improved with rest.  He denies any orthopnea, PND, leg edema symptoms.  Reviewed recent echocardiogram results with the patient, details below.  Patient was noted to have a hemoglobin of 10.6 on 9 September, but does not appear that he has undergone any workup for anemia.  Vitals:   04/23/23 0711  BP: 139/70  Pulse: 64  Resp: 16  Temp: 98.3 F (36.8 C)  SpO2: 100%     ROS:  Review of Systems  Cardiovascular:  Positive for chest pain and dyspnea on exertion. Negative for leg swelling, palpitations and syncope.  Respiratory:  Positive for snoring.      Studies Reviewed: Marland Kitchen       EKG 04/17/2023: Sinus rhythm with Premature atrial complexes When compared with ECG of 20-Sep-2017 09:53, Premature atrial complexes are now Present    Echocardiogram 04/09/2023: 1. Left ventricular ejection fraction, by estimation, is 60 to 65%. Left  ventricular ejection fraction by 3D volume is 64 %. The left ventricle has  normal function. The left ventricle has no regional wall motion  abnormalities. Left  ventricular diastolic  parameters were normal. The average left ventricular global longitudinal strain is -25.7 %. The global longitudinal strain is normal.   2. Right ventricular systolic function is normal. The right ventricular  size is mildly enlarged. There is normal pulmonary artery systolic  pressure. The estimated right ventricular systolic pressure is 34.6 mmHg.   3. Right atrial size was mildly dilated.   4. The mitral valve is grossly normal. Mild to moderate mitral valve  regurgitation. No evidence of mitral stenosis.   5. Tricuspid valve regurgitation is moderate.   6. The aortic valve is tricuspid. Aortic valve regurgitation is trivial.  No aortic stenosis is present.   7. There is mild dilatation of the ascending aorta, measuring 42 mm.   8. The inferior vena cava is normal in size with greater than 50%  respiratory variability, suggesting right atrial pressure of 3 mmHg.   Comparison(s): No significant change from prior study.   Independently interpreted Labs 01/17/2023: Chol 107, TG 51, HDL 57, LDL 45 HbA1C 5.5% Hb 10.6 Cr NA, K 3.8 BNP 123 (12/2021)    Physical Exam:   Physical Exam Vitals and nursing note reviewed.  Constitutional:      General: He is not in acute distress. Neck:     Vascular: No JVD.  Cardiovascular:     Rate and Rhythm: Normal rate and regular rhythm.     Heart sounds: Murmur heard.     High-pitched blowing holosystolic murmur is present with a  grade of 2/6 at the apex.  Pulmonary:     Effort: Pulmonary effort is normal.     Breath sounds: Normal breath sounds. No wheezing or rales.  Musculoskeletal:     Right lower leg: No edema.     Left lower leg: No edema.      VISIT DIAGNOSES: No diagnosis found.    ASSESSMENT AND PLAN: .    Jared [name] is a 77 y.o. male with hypertension, hyperlipidemia, mod TR, mild PH, aortic root dilatation, PAD, anemia, mild PH, with exertional dyspnea, chest pain  Exertional chest pain,  dyspnea: Persistent symptoms, gradually progressive and lifestyle limiting. Etiology could be multifactorial, including anemia, mild pulmonary hypertension, as well as possibility of obstructive coronary artery disease. Stress test in 03/2022 did not show any ischemia, however his symptoms remain. He does have mild PAD, which is risk factor for CAD. Recommend right and left heart catheterization extremity angiogram for definitive coronary artery evaluation, as well as evaluate for pulmonary hypertension-mild pulmonary hypertension noted on echocardiogram. Unless any critical stenosis found, I would likely not perform coronary artery stenting at the same time, pending workup for anemia, as well as trial of medical therapy, if necessary. Added aspirin 81 mg daily. Continue statin, lipids well-controlled.  Anemia: Hemoglobin 10.6 in 01/2023. Previously, hemoglobin has been around 12.  His last colonoscopy was about 5 years ago.  He has always had few polyps noted on colonoscopy. Defer workup to primary care provider, with whom he has appointment next month.  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 Heart rate and blood pressure well-controlled. Repeat echocardiogram in 1 year (can be ordered at subsequent visits.  Snoring: Given his snoring reported by patient and his wife, mild pulmonary hypertension with moderate TR noted on echocardiogram, recommend sleep study for evaluation of possible OSA.  I spent 15 minutes the day of her visit reviewing her records- I reviewed prior notes, lab results, echocardiogram and stress test results in detail; 20 minutes talking with the patient about her history and in face-to-face counseling and 10 minutes in adjusting medication and discussing future test and procedures, for a total of 45 minutes spent in patient care.  Informed Consent   Shared Decision Making/Informed Consent The risks [stroke (1 in 1000), death (1 in 1000), kidney failure [usually  temporary] (1 in 500), bleeding (1 in 200), allergic reaction [possibly serious] (1 in 200)], benefits (diagnostic support and management of coronary artery disease) and alternatives of a cardiac catheterization were discussed in detail with Mr. Hearns and he is willing to proceed.       Meds ordered this encounter  Medications   aspirin chewable tablet 81 mg   FOLLOWED BY Linked Order Group    0.9% sodium chloride infusion    0.9% sodium chloride infusion     F/u after heart catheterization  Signed, Elder Negus, MD

## 2023-04-23 NOTE — Discharge Instructions (Signed)

## 2023-04-23 NOTE — Progress Notes (Signed)
TR BAND REMOVAL  LOCATION:    right radial  DEFLATED PER PROTOCOL:    Yes.    TIME BAND OFF / DRESSING APPLIED:    1330 gauze dressing applied   SITE UPON ARRIVAL:    Level 0  SITE AFTER BAND REMOVAL:    Level 0  CIRCULATION SENSATION AND MOVEMENT:    Within Normal Limits   Yes.    COMMENTS:   no issues noted

## 2023-04-23 NOTE — Interval H&P Note (Signed)
History and Physical Interval Note:  04/23/2023 9:14 AM  Jared [name]  has presented today for surgery, with the diagnosis of chest pain.  The various methods of treatment have been discussed with the patient and family. After consideration of risks, benefits and other options for treatment, the patient has consented to  Procedure(s): RIGHT/LEFT HEART CATH AND CORONARY ANGIOGRAPHY (N/A) as a surgical intervention.  The patient's history has been reviewed, patient examined, no change in status, stable for surgery.  I have reviewed the patient's chart and labs.  Questions were answered to the patient's satisfaction.     [name] J [name]

## 2023-04-24 ENCOUNTER — Telehealth (HOSPITAL_BASED_OUTPATIENT_CLINIC_OR_DEPARTMENT_OTHER): Payer: Self-pay | Admitting: *Deleted

## 2023-04-24 DIAGNOSIS — H04123 Dry eye syndrome of bilateral lacrimal glands: Secondary | ICD-10-CM | POA: Diagnosis not present

## 2023-04-24 DIAGNOSIS — Z961 Presence of intraocular lens: Secondary | ICD-10-CM | POA: Diagnosis not present

## 2023-04-24 DIAGNOSIS — H5703 Miosis: Secondary | ICD-10-CM | POA: Diagnosis not present

## 2023-04-24 DIAGNOSIS — H2181 Floppy iris syndrome: Secondary | ICD-10-CM | POA: Diagnosis not present

## 2023-04-24 DIAGNOSIS — H401134 Primary open-angle glaucoma, bilateral, indeterminate stage: Secondary | ICD-10-CM | POA: Diagnosis not present

## 2023-04-24 NOTE — Telephone Encounter (Signed)
   Pre-operative Risk Assessment    Patient Name: Jared [name]  DOB: August 27, 1945 MRN: [id number]   Last OV: Dr. Rosemary Holms 04/17/2023 Upcoming OV: Robin Searing, NP 05/17/2023  Request for Surgical Clearance    Procedure:   Bleb Needlling OD  w. Mitomycin  Date of Surgery:  Clearance 05/02/23                                 Surgeon:  Dr. Hollace Kinnier Surgeon's Group or Practice Name:  [facility] Phone number:  [phone] Fax number:  [phone]   Type of Clearance Requested:   - Medical  - Pharmacy:  Hold Aspirin Not Indicated.   Type of Anesthesia:  [facility] Local   Additional requests/questions:    Signed, Emmit Pomfret   04/24/2023, 1:48 PM

## 2023-04-24 NOTE — Telephone Encounter (Signed)
Good afternoon Dr. Joaquim Nam  We have received a surgical clearance request for Mr. Quinney for upcoming bleb needling of right eye with mitomycin. They were seen recently in clinic on 04/17/23. Can you please comment on surgical clearance and guidance on holding ASA 81 mg. Please forward you guidance and recommendations to P CV DIV PREOP   Thank you, Robin Searing, NP

## 2023-04-25 NOTE — Telephone Encounter (Signed)
   Patient Name: Jared [name]  DOB: Feb 24, 1946 MRN: [id number]  Primary Cardiologist: Dr. Rosemary Holms   Chart reviewed as part of pre-operative protocol coverage. Given past medical history and time since last visit, based on ACC/AHA guidelines, [name] is at acceptable risk for the planned procedure without further cardiovascular testing.   The patient was advised that if he develops new symptoms prior to surgery to contact our office to arrange for a follow-up visit, and he verbalized understanding  Per office protocol, if patient is without any new symptoms or concerns at the time of their virtual visit, he/she may hold ASA for 7 days prior to procedure. Please resume ASA as soon as possible postprocedure, at the discretion of the surgeon.    I will route this recommendation to the requesting party via Epic fax function and remove from pre-op pool.  Please call with questions.  Joni Reining, NP 04/25/2023, 10:49 AM

## 2023-05-02 DIAGNOSIS — H401134 Primary open-angle glaucoma, bilateral, indeterminate stage: Secondary | ICD-10-CM | POA: Diagnosis not present

## 2023-05-15 DIAGNOSIS — R519 Headache, unspecified: Secondary | ICD-10-CM | POA: Diagnosis not present

## 2023-05-15 DIAGNOSIS — R059 Cough, unspecified: Secondary | ICD-10-CM | POA: Diagnosis not present

## 2023-05-15 DIAGNOSIS — R0981 Nasal congestion: Secondary | ICD-10-CM | POA: Diagnosis not present

## 2023-05-15 NOTE — Progress Notes (Addendum)
 Cardiology Office Note    Patient Name: Jared [name] Date of Encounter: 05/15/2023  Primary Care Provider:  [name], FNP Primary Cardiologist:  None Primary Electrophysiologist: None   Past Medical History    Past Medical History:  Diagnosis Date   Arthritis    knees and back   Enlarged prostate    takes Flomax    Headache    Hypertension    stress test done about 12 yrs ago - results wnl per pt.    History of Present Illness  Jared [name] is a 78 y.o. male with a PMH of PE, HTN, aortic root dilation PAD, pulmonary HTN, moderate TR and mild to moderate MR, aortic root dilation who presents today for 1 month follow-up.  Jared [name] was seen initially by Jared [name] and is currently followed by Jared [name].  He established care in 2023 and was initially seen by Jared [name] on 04/17/23 with complaint of exertional chest pain and shortness of breath.  He had previous stress test completed that was normal in 03/2022 and underwent R/LHC repeat 2D echo.  Echo showed EF of 60-65% with no RWMA, enlarged RV with mild to moderate MV and moderate TR with trivial AVR.  Heart catheterization revealed moderate nonobstructive CAD with mild pulmonary HTN.  He was referred to PCP due to anemia and had Lasix  20 mg added to current medication regimen.  Jared [name] presents today for post heart catheter follow-up.  The catheterization was prompted by complaints of chest pain and shortness of breath. The procedure revealed moderate plaque buildup but did not require stent placement. It also identified pulmonary hypertension, which may be contributing to the patient's shortness of breath. Since the catheterization, the patient has been started on Lasix , which has led to frequent urination. The patient is unsure if the Lasix  has improved the shortness of breath, as he has not been very active recently. The patient continues to experience chest pain, particularly with exertion such as lawn work,  golfing, and climbing stairs. The chest pain does not occur every day but seems to be triggered by physical activity. The patient also reports feeling wobbly at times, particularly when walking. This symptom has been present for a few years and is not associated with dizziness or a feeling of impending fall. The patient is unsure if this symptom is related to his medications.  Patient denies palpitations, dyspnea, PND, orthopnea, nausea, vomiting, dizziness, syncope, edema, weight gain, or early satiety.   Review of Systems  Please see the history of present illness.    All other systems reviewed and are otherwise negative except as noted above.  Physical Exam    Wt Readings from Last 3 Encounters:  04/23/23 185 lb (83.9 kg)  04/17/23 185 lb (83.9 kg)  09/25/22 187 lb (84.8 kg)   CD:Uyzmz were no vitals filed for this visit.,There is no height or weight on file to calculate BMI. GEN: Well nourished, well developed in no acute distress Neck: No JVD; No carotid bruits Pulmonary: Clear to auscultation without rales, wheezing or rhonchi  Cardiovascular: Normal rate. Regular rhythm. Normal S1. Normal S2.   Murmurs: There is no murmur.  ABDOMEN: Soft, non-tender, non-distended EXTREMITIES:  No edema; No deformity   EKG/LABS/ Recent Cardiac Studies   ECG personally reviewed by me today -none completed today  Risk Assessment/Calculations:          Lab Results  Component Value Date   WBC 4.6 04/17/2023   HGB 10.5 (L)  04/23/2023   HGB 10.2 (L) 04/23/2023   HCT 31.0 (L) 04/23/2023   HCT 30.0 (L) 04/23/2023   MCV 103 (H) 04/17/2023   PLT 134 (L) 04/17/2023   Lab Results  Component Value Date   CREATININE 1.17 04/17/2023   BUN 13 04/17/2023   NA 141 04/23/2023   NA 141 04/23/2023   K 3.9 04/23/2023   K 3.9 04/23/2023   CL 105 04/17/2023   CO2 24 04/17/2023   No results found for: CHOL, HDL, LDLCALC, LDLDIRECT, TRIG, CHOLHDL  No results found for:  HGBA1C Assessment & Plan    1.  Stable angina: -Exertional chest pain despite moderate non-obstructive coronary artery disease on recent catheterization. Discussed the pathophysiology of angina and the potential benefit of long-acting nitroglycerin  (Isosorbide ). -Start Isosorbide  15mg  daily with an additional 15mg  as needed for breakthrough symptoms. -Return in 3 months or sooner if symptoms persist despite medication adjustment.  2.  Coronary artery disease: -Recent L/RHC completed showing moderate nonobstructive disease in coronaries. -Patient continues to have discomfort following left heart catheterization and will start Imdur  as noted above. -Continue current GDMT with ASA 81 mg, and Lipitor 10 mg  3.    Essential hypertension: -Patient's blood pressure today is well-controlled at 110/52 -Continue lisinopril  20 mg daily Norvasc  10 mg daily  4.  Hyperlipidemia: -Patient's last LDL cholesterol was 38 at goal -Continue Lipitor 10 mg daily  5.  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 Currently on Lasix  20mg  daily. -Continue Lasix  20mg  daily. -Encourage increased fluid intake to 64 ounces daily. -Check kidney function and CBC today.  6.  Postural dizziness: -Patient reports episodes of dizziness upon standing with stumbling during ambulation  -This could be related to ongoing anemia we will check CBC today -Patient was advised to change positions slowly and contact our office if he continues to feel dizziness or postural symptoms.  Disposition: Follow-up with None or APP in 3 months    Signed, Wyn Raddle, Jackee Shove, NP 05/15/2023, 8:04 PM [facility]

## 2023-05-17 ENCOUNTER — Telehealth: Payer: Self-pay

## 2023-05-17 ENCOUNTER — Ambulatory Visit: Payer: Medicare Other | Attending: Nurse Practitioner | Admitting: Nurse Practitioner

## 2023-05-17 ENCOUNTER — Encounter: Payer: Self-pay | Admitting: Nurse Practitioner

## 2023-05-17 VITALS — BP 110/52 | HR 60 | Ht 71.0 in | Wt 186.0 lb

## 2023-05-17 DIAGNOSIS — I2510 Atherosclerotic heart disease of native coronary artery without angina pectoris: Secondary | ICD-10-CM | POA: Diagnosis not present

## 2023-05-17 DIAGNOSIS — E782 Mixed hyperlipidemia: Secondary | ICD-10-CM | POA: Diagnosis not present

## 2023-05-17 DIAGNOSIS — I209 Angina pectoris, unspecified: Secondary | ICD-10-CM

## 2023-05-17 DIAGNOSIS — I340 Nonrheumatic mitral (valve) insufficiency: Secondary | ICD-10-CM | POA: Diagnosis not present

## 2023-05-17 DIAGNOSIS — I2729 Other secondary pulmonary hypertension: Secondary | ICD-10-CM

## 2023-05-17 DIAGNOSIS — I10 Essential (primary) hypertension: Secondary | ICD-10-CM

## 2023-05-17 DIAGNOSIS — I77810 Thoracic aortic ectasia: Secondary | ICD-10-CM | POA: Diagnosis not present

## 2023-05-17 DIAGNOSIS — Z79899 Other long term (current) drug therapy: Secondary | ICD-10-CM

## 2023-05-17 MED ORDER — ISOSORBIDE MONONITRATE ER 30 MG PO TB24: 15.0000 mg | ORAL_TABLET | Freq: Every day | ORAL | 3 refills | Status: AC

## 2023-05-17 NOTE — Patient Instructions (Signed)
 Medication Instructions:  Please START taking 15 mg IMDUR  daily, you can take an additional 15 mg as needed for chest pain.  *If you need a refill on your cardiac medications before your next appointment, please call your pharmacy*   Lab Work: Please complete a CBC and a BMET at the first floor [facility] before you leave today.  If you have labs (blood work) drawn today and your tests are completely normal, you will receive your results only by: MyChart Message (if you have MyChart) OR A paper copy in the mail If you have any lab test that is abnormal or we need to change your treatment, we will call you to review the results.   Testing/Procedures: None.   Follow-Up:  Your next appointment:   3 month(s)  Provider:   Dr. CHRISTELLA. Patwardhan or Jackee Alberts, NP   Other Instructions Please follow a low-sodium, heart healthy diet.   Please ensure you drink 64 ounces of water a day.

## 2023-05-17 NOTE — Telephone Encounter (Signed)
**Note De-Identified Kaydince Towles Obfuscation** Per the [facility] Provider Portal: Prior Authorization/Notification is not required for the requested service(s). CPT Code: 13086  You are not required to submit a notification/prior authorization based on the information provided. Decision ID #: [id number]

## 2023-05-18 LAB — CBC WITH DIFFERENTIAL/PLATELET
Basophils Absolute: 0 10*3/uL (ref 0.0–0.2)
Basos: 0 %
EOS (ABSOLUTE): 0.1 10*3/uL (ref 0.0–0.4)
Eos: 1 %
Hematocrit: 29.2 % — ABNORMAL LOW (ref 37.5–51.0)
Hemoglobin: 9.5 g/dL — ABNORMAL LOW (ref 13.0–17.7)
Immature Grans (Abs): 0.2 10*3/uL — ABNORMAL HIGH (ref 0.0–0.1)
Immature Granulocytes: 3 %
Lymphocytes Absolute: 2.3 10*3/uL (ref 0.7–3.1)
Lymphs: 42 %
MCH: 33.2 pg — ABNORMAL HIGH (ref 26.6–33.0)
MCHC: 32.5 g/dL (ref 31.5–35.7)
MCV: 102 fL — ABNORMAL HIGH (ref 79–97)
Monocytes Absolute: 0.2 10*3/uL (ref 0.1–0.9)
Monocytes: 4 %
NRBC: 1 % — ABNORMAL HIGH (ref 0–0)
Neutrophils Absolute: 2.7 10*3/uL (ref 1.4–7.0)
Neutrophils: 50 %
Platelets: 132 10*3/uL — ABNORMAL LOW (ref 150–450)
RBC: 2.86 x10E6/uL — ABNORMAL LOW (ref 4.14–5.80)
RDW: 21.6 % — ABNORMAL HIGH (ref 11.6–15.4)
WBC: 5.5 10*3/uL (ref 3.4–10.8)

## 2023-05-18 LAB — BASIC METABOLIC PANEL
BUN/Creatinine Ratio: 15 (ref 10–24)
BUN: 22 mg/dL (ref 8–27)
CO2: 22 mmol/L (ref 20–29)
Calcium: 9.5 mg/dL (ref 8.6–10.2)
Chloride: 105 mmol/L (ref 96–106)
Creatinine, Ser: 1.43 mg/dL — ABNORMAL HIGH (ref 0.76–1.27)
Glucose: 93 mg/dL (ref 70–99)
Potassium: 4.6 mmol/L (ref 3.5–5.2)
Sodium: 143 mmol/L (ref 134–144)
eGFR: 50 mL/min/{1.73_m2} — ABNORMAL LOW (ref >59)

## 2023-05-20 ENCOUNTER — Telehealth: Payer: Self-pay | Admitting: *Deleted

## 2023-05-20 NOTE — Telephone Encounter (Signed)
-----   Message from Nurse Avelina GAILS sent at 05/17/2023  8:57 AM EST ----- Regarding: RE: split night sleep study per Dr. Elmira Erskin Eans, Sorry for the delay in responding to you but I had to wait until the new year to do this mans PA per [facility]. The pts Ins [facility]) did not req a PA for his Split Night Sleep Study. I have forwarded his order to the sleep lab and they will contact him to schedule his sleep test. Thanks, Macario ----- Message ----- From: Gladis Eans HERO, LPN Sent: [id number]/09/7973   9:54 AM EST To: Dalton KANDICE Molt, CMA; Avelina HERO Via, LPN; # Subject: split night sleep study per Dr. Elmira     Dr. Elmira ordered for this pt to get a split night sleep study only, to be done in lab for snoring and HTN  Order is in the system and pt aware you will call to schedule  Can you please arrange and let me know the date when scheduled?   Thanks [employer]

## 2023-05-29 ENCOUNTER — Telehealth: Payer: Self-pay | Admitting: Cardiology

## 2023-05-29 MED ORDER — LISINOPRIL 20 MG PO TABS: 20.0000 mg | ORAL_TABLET | Freq: Every day | ORAL | 3 refills | Status: DC

## 2023-05-29 NOTE — Telephone Encounter (Signed)
 Pt's medication was sent to pt's pharmacy as requested. Confirmation received.

## 2023-05-29 NOTE — Telephone Encounter (Signed)
*  STAT* If patient is at the pharmacy, call can be transferred to refill team.   1. Which medications need to be refilled? (please list name of each medication and dose if known) lisinopril  (ZESTRIL ) 20 MG tablet    2. Would you like to learn more about the convenience, safety, & potential cost savings by using the [facility]?    3. Are you open to using the [facility] (Type [facility]. ).   4. Which pharmacy/location (including street and city if local pharmacy) is medication to be sent to? [id number] - [place], [place] - [address]    5. Do they need a 30 day or 90 day supply? 90

## 2023-06-13 ENCOUNTER — Encounter (HOSPITAL_BASED_OUTPATIENT_CLINIC_OR_DEPARTMENT_OTHER): Payer: Medicare Other | Admitting: Cardiology

## 2023-06-21 ENCOUNTER — Other Ambulatory Visit: Payer: Self-pay

## 2023-06-27 DIAGNOSIS — N1831 Chronic kidney disease, stage 3a: Secondary | ICD-10-CM | POA: Diagnosis not present

## 2023-06-27 DIAGNOSIS — I071 Rheumatic tricuspid insufficiency: Secondary | ICD-10-CM | POA: Diagnosis not present

## 2023-06-27 DIAGNOSIS — I340 Nonrheumatic mitral (valve) insufficiency: Secondary | ICD-10-CM | POA: Diagnosis not present

## 2023-06-27 DIAGNOSIS — R7303 Prediabetes: Secondary | ICD-10-CM | POA: Diagnosis not present

## 2023-06-27 DIAGNOSIS — Z23 Encounter for immunization: Secondary | ICD-10-CM | POA: Diagnosis not present

## 2023-06-27 DIAGNOSIS — I10 Essential (primary) hypertension: Secondary | ICD-10-CM | POA: Diagnosis not present

## 2023-06-27 DIAGNOSIS — H409 Unspecified glaucoma: Secondary | ICD-10-CM | POA: Diagnosis not present

## 2023-06-27 DIAGNOSIS — E7800 Pure hypercholesterolemia, unspecified: Secondary | ICD-10-CM | POA: Diagnosis not present

## 2023-07-01 ENCOUNTER — Other Ambulatory Visit: Payer: Self-pay

## 2023-07-09 ENCOUNTER — Other Ambulatory Visit: Payer: Self-pay | Admitting: Nurse Practitioner

## 2023-08-19 NOTE — Progress Notes (Unsigned)
 Cardiology Office Note    Patient Name: Jared [name] Date of Encounter: 08/19/2023  Primary Care Provider:  Soundra Pilon, FNP Primary Cardiologist:  None Primary Electrophysiologist: None   Past Medical History    Past Medical History:  Diagnosis Date   Arthritis    knees and back   Enlarged prostate    takes Flomax   Headache    Hypertension    stress test done about 12 yrs ago - results wnl per pt.    History of Present Illness  Jared [name] is a 78 y.o. 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  Jared [name] was last seen on 05/17/2023 following previous heart catheterization procedure that identified pulmonary HTN with no significant blockages.  He reported infrequent urination with current dose of Lasix and was unsure if his shortness of breath had improved.  He also noted ongoing chest pain with activity such as mowing grass golfing and climbing stairs.  He was started on Imdur 15 mg with as needed 50 mg for breakthrough discomfort.  He also reported postural dizziness and had CBC checked to determine if anemia was calling symptoms that revealed decreased hemoglobin and hematocrit.  He was advised to follow-up with his PCP for further evaluation.  He was noted to have well-controlled BP during visit and advised to continue current dose of Lasix.  Jared [name] presents today for 46-month follow-up.  The pain has improved with the use of isosorbide mononitrate, becoming less severe and less frequent, primarily occurring during exertion. He has shortness of breath that worsens with exertion, such as jogging, running, or walking fast, especially uphill. The symptom is more pronounced with overexertion. He experiences dizziness, particularly when standing up quickly from a sitting position or bending over. This symptom has improved but still occurs occasionally. He  has a history of anemia, with low blood counts, which could contribute to his symptoms. He is currently taking Norvasc (amlodipine) and Lasix (furosemide) daily. The Norvasc may contribute to his dizziness due to its vasodilatory effects. No significant swelling in his ankles or legs. Patient denies chest pain, palpitations, dyspnea, PND, orthopnea, nausea, vomiting, dizziness, syncope, edema, weight gain, or early satiety.  Discussed the use of AI scribe software for clinical note transcription with the patient, who gave verbal consent to proceed.  History of Present Illness   Review of Systems  Please see the history of present illness.    All other systems reviewed and are otherwise negative except as noted above.  Physical Exam    Wt Readings from Last 3 Encounters:  05/17/23 186 lb (84.4 kg)  04/23/23 185 lb (83.9 kg)  04/17/23 185 lb (83.9 kg)   YN:WGNFA were no vitals filed for this visit.,There is no height or weight on file to calculate BMI. GEN: Well nourished, well developed in no acute distress Neck: No JVD; No carotid bruits Pulmonary: Clear to auscultation without rales, wheezing or rhonchi  Cardiovascular: Normal rate. Regular rhythm. Normal S1. Normal S2.   Murmurs: There is no murmur.  ABDOMEN: Soft, non-tender, non-distended EXTREMITIES:  No edema; No deformity   EKG/LABS/ Recent Cardiac Studies   ECG personally reviewed by me today -none completed today  Risk Assessment/Calculations:          Lab Results  Component Value Date   WBC 5.5 05/17/2023   HGB 9.5 (L) 05/17/2023  HCT 29.2 (L) 05/17/2023   MCV 102 (H) 05/17/2023   PLT 132 (L) 05/17/2023   Lab Results  Component Value Date   CREATININE 1.43 (H) 05/17/2023   BUN 22 05/17/2023   NA 143 05/17/2023   K 4.6 05/17/2023   CL 105 05/17/2023   CO2 22 05/17/2023   No results found for: "CHOL", "HDL", "LDLCALC", "LDLDIRECT", "TRIG", "CHOLHDL"  No results found for: "HGBA1C" Assessment & Plan     1.   Coronary artery disease/exertional chest pain: -Recent L/RHC completed showing moderate nonobstructive disease in coronaries. -Patient reports improvement to chest discomfort with Imdur 50 mg daily.  He was advised to take additional 50 mg as needed. -Continue current GDMT with ASA 81 mg, Lipitor 10 mg, Imdur 15 mg and as needed Nitrostat 0.4 mg  2.  Essential HTN: -Patients BP today was stable at 116/58 -Continue Norvasc 10 mg daily, lisinopril 20 mg daily  3.  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 -Continue amlodipine and furosemide as prescribed. - Report increased dyspnea.  5.  Postural dizziness: -Dizziness upon standing likely related to amlodipine and anemia. - Discuss with primary care physician if symptoms persist.  Disposition: Follow-up with None or APP in 6 months   Signed, Napoleon Form, Leodis Rains, NP 08/19/2023, 10:06 AM [facility]

## 2023-08-20 ENCOUNTER — Ambulatory Visit: Payer: Medicare Other | Attending: Cardiology | Admitting: Nurse Practitioner

## 2023-08-20 ENCOUNTER — Encounter: Payer: Self-pay | Admitting: Nurse Practitioner

## 2023-08-20 VITALS — BP 116/58 | HR 66 | Ht 71.0 in | Wt 184.0 lb

## 2023-08-20 DIAGNOSIS — I10 Essential (primary) hypertension: Secondary | ICD-10-CM | POA: Diagnosis not present

## 2023-08-20 DIAGNOSIS — R079 Chest pain, unspecified: Secondary | ICD-10-CM

## 2023-08-20 DIAGNOSIS — I340 Nonrheumatic mitral (valve) insufficiency: Secondary | ICD-10-CM

## 2023-08-20 DIAGNOSIS — I2510 Atherosclerotic heart disease of native coronary artery without angina pectoris: Secondary | ICD-10-CM

## 2023-08-20 DIAGNOSIS — E782 Mixed hyperlipidemia: Secondary | ICD-10-CM

## 2023-08-20 NOTE — Patient Instructions (Addendum)
 Medication Instructions:  Your physician recommends that you continue on your current medications as directed. Please refer to the Current Medication list given to you today. *If you need a refill on your cardiac medications before your next appointment, please call your pharmacy*  Lab Work: None ordered If you have labs (blood work) drawn today and your tests are completely normal, you will receive your results only by: MyChart Message (if you have MyChart) OR A paper copy in the mail If you have any lab test that is abnormal or we need to change your treatment, we will call you to review the results.  Testing/Procedures: None ordered  Follow-Up: At [facility], you and your health needs are our priority.  As part of our continuing mission to provide you with exceptional heart care, our providers are all part of one team.  This team includes your primary Cardiologist (physician) and Advanced Practice Providers or APPs (Physician Assistants and Nurse Practitioners) who all work together to provide you with the care you need, when you need it.  Your next appointment:   6 month(s)  Provider:   Armanda Magic, MD or Robin Searing, NP  We recommend signing up for the patient portal called "MyChart".  Sign up information is provided on this After Visit Summary.  MyChart is used to connect with patients for Virtual Visits (Telemedicine).  Patients are able to view lab/test results, encounter notes, upcoming appointments, etc.  Non-urgent messages can be sent to your provider as well.   To learn more about what you can do with MyChart, go to [email or web address].   Other Instructions PLEASE CONTACT YOUR PRIMARY CARE PROVIDER REGARDING STARTING IRON FOR YOUR ANEMIA      1st Floor: - Lobby - Registration  - Pharmacy  - Lab - Cafe  2nd Floor: - PV Lab - Diagnostic Testing (echo, CT, nuclear med)  3rd Floor: - Vacant  4th Floor: - TCTS (cardiothoracic surgery) - AFib  Clinic - Structural Heart Clinic - Vascular Surgery  - Vascular Ultrasound  5th Floor: - HeartCare Cardiology (general and EP) - Clinical Pharmacy for coumadin, hypertension, lipid, weight-loss medications, and med management appointments    Valet parking services will be available as well.

## 2023-09-02 DIAGNOSIS — H401134 Primary open-angle glaucoma, bilateral, indeterminate stage: Secondary | ICD-10-CM | POA: Diagnosis not present

## 2023-09-02 DIAGNOSIS — H04123 Dry eye syndrome of bilateral lacrimal glands: Secondary | ICD-10-CM | POA: Diagnosis not present

## 2023-09-02 DIAGNOSIS — H5703 Miosis: Secondary | ICD-10-CM | POA: Diagnosis not present

## 2023-09-02 DIAGNOSIS — H2181 Floppy iris syndrome: Secondary | ICD-10-CM | POA: Diagnosis not present

## 2023-09-05 ENCOUNTER — Other Ambulatory Visit: Payer: Self-pay | Admitting: Cardiology

## 2023-09-05 DIAGNOSIS — R079 Chest pain, unspecified: Secondary | ICD-10-CM

## 2023-09-05 DIAGNOSIS — I2729 Other secondary pulmonary hypertension: Secondary | ICD-10-CM

## 2023-09-05 DIAGNOSIS — I340 Nonrheumatic mitral (valve) insufficiency: Secondary | ICD-10-CM

## 2023-09-05 DIAGNOSIS — R0609 Other forms of dyspnea: Secondary | ICD-10-CM

## 2023-09-05 DIAGNOSIS — R0683 Snoring: Secondary | ICD-10-CM

## 2023-09-05 DIAGNOSIS — D649 Anemia, unspecified: Secondary | ICD-10-CM

## 2023-09-16 DIAGNOSIS — N3091 Cystitis, unspecified with hematuria: Secondary | ICD-10-CM | POA: Diagnosis not present

## 2023-09-16 DIAGNOSIS — R300 Dysuria: Secondary | ICD-10-CM | POA: Diagnosis not present

## 2023-09-16 DIAGNOSIS — N3001 Acute cystitis with hematuria: Secondary | ICD-10-CM | POA: Diagnosis not present

## 2023-09-30 ENCOUNTER — Other Ambulatory Visit: Payer: Self-pay

## 2023-10-28 DIAGNOSIS — D649 Anemia, unspecified: Secondary | ICD-10-CM | POA: Diagnosis not present

## 2023-12-03 DIAGNOSIS — M25511 Pain in right shoulder: Secondary | ICD-10-CM | POA: Diagnosis not present

## 2023-12-04 DIAGNOSIS — N1831 Chronic kidney disease, stage 3a: Secondary | ICD-10-CM | POA: Diagnosis not present

## 2023-12-04 DIAGNOSIS — D649 Anemia, unspecified: Secondary | ICD-10-CM | POA: Diagnosis not present

## 2023-12-04 DIAGNOSIS — M25511 Pain in right shoulder: Secondary | ICD-10-CM | POA: Diagnosis not present

## 2023-12-04 DIAGNOSIS — I10 Essential (primary) hypertension: Secondary | ICD-10-CM | POA: Diagnosis not present

## 2023-12-04 DIAGNOSIS — E7800 Pure hypercholesterolemia, unspecified: Secondary | ICD-10-CM | POA: Diagnosis not present

## 2023-12-17 DIAGNOSIS — J0100 Acute maxillary sinusitis, unspecified: Secondary | ICD-10-CM | POA: Diagnosis not present

## 2023-12-25 DIAGNOSIS — M19011 Primary osteoarthritis, right shoulder: Secondary | ICD-10-CM | POA: Diagnosis not present

## 2023-12-25 DIAGNOSIS — M19012 Primary osteoarthritis, left shoulder: Secondary | ICD-10-CM | POA: Diagnosis not present

## 2023-12-30 DIAGNOSIS — H5703 Miosis: Secondary | ICD-10-CM | POA: Diagnosis not present

## 2023-12-30 DIAGNOSIS — H2181 Floppy iris syndrome: Secondary | ICD-10-CM | POA: Diagnosis not present

## 2023-12-30 DIAGNOSIS — H401134 Primary open-angle glaucoma, bilateral, indeterminate stage: Secondary | ICD-10-CM | POA: Diagnosis not present

## 2023-12-30 DIAGNOSIS — H04123 Dry eye syndrome of bilateral lacrimal glands: Secondary | ICD-10-CM | POA: Diagnosis not present

## 2023-12-31 ENCOUNTER — Inpatient Hospital Stay: Attending: Hematology and Oncology | Admitting: Hematology and Oncology

## 2023-12-31 ENCOUNTER — Inpatient Hospital Stay

## 2023-12-31 VITALS — BP 120/57 | HR 67 | Temp 98.6°F | Resp 13 | Wt 170.2 lb

## 2023-12-31 DIAGNOSIS — N189 Chronic kidney disease, unspecified: Secondary | ICD-10-CM | POA: Diagnosis not present

## 2023-12-31 DIAGNOSIS — R5383 Other fatigue: Secondary | ICD-10-CM | POA: Insufficient documentation

## 2023-12-31 DIAGNOSIS — D539 Nutritional anemia, unspecified: Secondary | ICD-10-CM

## 2023-12-31 DIAGNOSIS — Z79899 Other long term (current) drug therapy: Secondary | ICD-10-CM | POA: Insufficient documentation

## 2023-12-31 DIAGNOSIS — D696 Thrombocytopenia, unspecified: Secondary | ICD-10-CM

## 2023-12-31 DIAGNOSIS — R634 Abnormal weight loss: Secondary | ICD-10-CM | POA: Insufficient documentation

## 2023-12-31 DIAGNOSIS — Z87891 Personal history of nicotine dependence: Secondary | ICD-10-CM | POA: Insufficient documentation

## 2023-12-31 LAB — CBC WITH DIFFERENTIAL/PLATELET
Abs Immature Granulocytes: 0.51 K/uL — ABNORMAL HIGH (ref 0.00–0.07)
Basophils Absolute: 0.1 K/uL (ref 0.0–0.1)
Basophils Relative: 1 %
Eosinophils Absolute: 0.6 K/uL — ABNORMAL HIGH (ref 0.0–0.5)
Eosinophils Relative: 7 %
HCT: 29.6 % — ABNORMAL LOW (ref 39.0–52.0)
Hemoglobin: 9.2 g/dL — ABNORMAL LOW (ref 13.0–17.0)
Immature Granulocytes: 6 %
Lymphocytes Relative: 19 %
Lymphs Abs: 1.7 K/uL (ref 0.7–4.0)
MCH: 31.5 pg (ref 26.0–34.0)
MCHC: 31.1 g/dL (ref 30.0–36.0)
MCV: 101.4 fL — ABNORMAL HIGH (ref 80.0–100.0)
Monocytes Absolute: 0.4 K/uL (ref 0.1–1.0)
Monocytes Relative: 4 %
Neutro Abs: 5.9 K/uL (ref 1.7–7.7)
Neutrophils Relative %: 63 %
Platelets: 169 K/uL (ref 150–400)
RBC: 2.92 MIL/uL — ABNORMAL LOW (ref 4.22–5.81)
RDW: 25.2 % — ABNORMAL HIGH (ref 11.5–15.5)
WBC: 9.2 K/uL (ref 4.0–10.5)
nRBC: 2.1 % — ABNORMAL HIGH (ref 0.0–0.2)

## 2023-12-31 LAB — IRON AND IRON BINDING CAPACITY (CC-WL,HP ONLY)
Iron: 119 ug/dL (ref 45–182)
Saturation Ratios: 31 % (ref 17.9–39.5)
TIBC: 386 ug/dL (ref 250–450)
UIBC: 267 ug/dL (ref 117–376)

## 2023-12-31 LAB — CMP (CANCER CENTER ONLY)
ALT: 10 U/L (ref 0–44)
AST: 19 U/L (ref 15–41)
Albumin: 4.6 g/dL (ref 3.5–5.0)
Alkaline Phosphatase: 81 U/L (ref 38–126)
Anion gap: 5 (ref 5–15)
BUN: 27 mg/dL — ABNORMAL HIGH (ref 8–23)
CO2: 27 mmol/L (ref 22–32)
Calcium: 9.6 mg/dL (ref 8.9–10.3)
Chloride: 106 mmol/L (ref 98–111)
Creatinine: 1.74 mg/dL — ABNORMAL HIGH (ref 0.61–1.24)
GFR, Estimated: 39 mL/min — ABNORMAL LOW (ref >60)
Glucose, Bld: 96 mg/dL (ref 70–99)
Potassium: 4.3 mmol/L (ref 3.5–5.1)
Sodium: 138 mmol/L (ref 135–145)
Total Bilirubin: 1.4 mg/dL — ABNORMAL HIGH (ref 0.0–1.2)
Total Protein: 6.6 g/dL (ref 6.5–8.1)

## 2023-12-31 LAB — VITAMIN B12: Vitamin B-12: 1072 pg/mL — ABNORMAL HIGH (ref 180–914)

## 2023-12-31 NOTE — Progress Notes (Signed)
 [facility] CONSULT NOTE  Patient Care Team: [name], FNP as PCP - General (Family Medicine)  CHIEF COMPLAINTS/PURPOSE OF CONSULTATION:  Macrocytic anemia and thrombocytopenia.  ASSESSMENT & PLAN:  Assessment and Plan Assessment & Plan Macrocytic anemia and thrombocytopenia, evaluation for myelodysplasia Macrocytic anemia with hemoglobin at 9.2 g/dL and thrombocytopenia. Differential includes myelodysplasia, vitamin B12 deficiency, thyroid  dysfunction, and chronic kidney disease. Myelodysplasia suspected but unconfirmed. - Order blood tests for vitamin B12, thyroid  function, and other causes of macrocytic anemia and thrombocytopenia. - Arrange bone marrow biopsy if blood tests are inconclusive. - Discuss potential outcomes and treatment options post-diagnosis. - Call next week to discuss blood test results and next steps.  Chronic kidney disease Potential contributor to anemia but not primary cause of hematological abnormalities.   HISTORY OF PRESENTING ILLNESS:  Jared [name] 78 y.o. male is here because of macrocytic anemia and thrombocytopenia.  Discussed the use of AI scribe software for clinical note transcription with the patient, who gave verbal consent to proceed.  History of Present Illness Jared [name] is an 78 year old male who presents for evaluation of low blood counts.  He has been experiencing increased fatigue and significant weight loss over the past few years, with his weight decreasing from approximately 250 pounds to 170 pounds. He also notes a decreased appetite, often eating only a small amount at dinner and feeling hungry again shortly after. Additionally, he experiences shortness of breath and persistent fatigue.  His medical history includes a hospitalization in December 2012 for right knee surgery, during which low platelet counts were noted but later returned to normal. He has a history of kidney function issues, initially thought to  contribute to his anemia.  He has a history of arthritis, prostate issues, high blood pressure, and has undergone back surgery, knee replacement, and hip replacement. He quit smoking a long time ago. His current medications include Flomax , amlodipine , and lisinopril . He recently stopped taking multivitamins.  He is retired, previously worked for a [employer] and [employer] for a total of 30 years. 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    REVIEW OF SYSTEMS:   Constitutional: Denies fevers, chills or abnormal night sweats Eyes: Denies blurriness of vision, double vision or watery eyes Ears, nose, mouth, throat, and face: Denies mucositis or sore throat Respiratory: Denies cough, dyspnea or wheezes Cardiovascular: Denies palpitation, chest discomfort or lower extremity swelling Gastrointestinal:  Denies nausea, heartburn or change in bowel habits Skin: Denies abnormal skin rashes Lymphatics: Denies new lymphadenopathy or easy bruising Neurological:Denies numbness, tingling or new weaknesses Behavioral/Psych: Mood is stable, no new changes  All other systems were reviewed with the patient and are negative.  MEDICAL HISTORY:  Past Medical History:  Diagnosis Date   Arthritis    knees and back   Enlarged prostate    takes Flomax    Headache    Hypertension    stress test done about 12 yrs ago - results wnl per pt.    SURGICAL HISTORY: Past Surgical History:  Procedure Laterality Date   BACK SURGERY  2002   CORONARY PRESSURE/FFR STUDY N/A 04/23/2023   Procedure: CORONARY PRESSURE/FFR STUDY;  Surgeon: Elmira Newman PARAS, MD;  Location: [facility] INVASIVE CV LAB;  Service: Cardiovascular;  Laterality: N/A;   JOINT REPLACEMENT     KNEE ARTHROSCOPY Left 05/2004   thelbert 09/27/2010   RIGHT/LEFT HEART CATH AND CORONARY ANGIOGRAPHY N/A 04/23/2023   Procedure: RIGHT/LEFT HEART CATH AND CORONARY ANGIOGRAPHY;  Surgeon: Elmira Newman PARAS, MD;  Location: [facility] INVAS[facility] CV [facility];   Service: Cardiovascular;  Laterality: N/A;   SHOULDER ARTHROSCOPY WITH OPEN ROTATOR CUFF REPAIR Right 2009   /notes 09/13/2010   TOTAL HIP ARTHROPLASTY Left 10/02/2017   TOTAL HIP ARTHROPLASTY Left 10/02/2017   Procedure: LEFT TOTAL HIP ARTHROPLASTY ANTERIOR APPROACH;  Surgeon: Liam Lerner, MD;  Location: [facility] OR;  Service: Orthopedics;  Laterality: Left;   TOTAL KNEE ARTHROPLASTY  04/16/2011   Procedure: TOTAL KNEE ARTHROPLASTY;  Surgeon: Lerner PARAS Liam;  Location: [facility] OR;  Service: Orthopedics;  Laterality: Left;  LEFT TOTAL KNEE ARTHROPLASTY    TOTAL KNEE ARTHROPLASTY Right 04/19/2014   Procedure: RIGHT TOTAL KNEE ARTHROPLASTY;  Surgeon: Lerner PARAS Liam, MD;  Location: [facility] OR;  Service: Orthopedics;  Laterality: Right;    SOCIAL HISTORY: Social History   Socioeconomic History   Marital status: Married    Spouse name: Not on file   Number of children: Not on file   Years of education: Not on file   Highest education level: Not on file  Occupational History   Not on file  Tobacco Use   Smoking status: Former    Current packs/day: 0.00    Average packs/day: 1 pack/day for 10.0 years (10.0 ttl pk-yrs)    Types: Cigarettes    Start date: 09/06/1964    Quit date: 09/07/1974    Years since quitting: 49.3   Smokeless tobacco: Never  Vaping Use   Vaping status: Never Used  Substance and Sexual Activity   Alcohol use: No    Comment: quit 1976 - beer   Drug use: No   Sexual activity: Not on file  Other Topics Concern   Not on file  Social History Narrative   Not on file   Social Drivers of Health   Financial Resource Strain: Not on file  Food Insecurity: Not on file  Transportation Needs: Not on file  Physical Activity: Not on file  Stress: Not on file  Social Connections: Not on file  Intimate Partner Violence: Not on file    FAMILY HISTORY: Family History  Problem Relation Age of Onset   Migraines Mother    Cancer Sister    Cancer Sister     ALLERGIES:  has no known  allergies.  MEDICATIONS:  Current Outpatient Medications  Medication Sig Dispense Refill   acetaminophen  (TYLENOL ) 650 MG CR tablet 650 mg as needed for pain.     amLODipine  (NORVASC ) 10 MG tablet Take 10 mg by mouth daily.     aspirin  EC (ASPIRIN  LOW DOSE) 81 MG tablet TAKE 1 TABLET (81 MG TOTAL) BY MOUTH DAILY. SWALLOW WHOLE. 450 tablet 3   atorvastatin (LIPITOR) 10 MG tablet Take 10 mg by mouth daily.     Azelastine HCl 137 MCG/SPRAY SOLN Place into both nostrils as needed (allergies).     brimonidine  (ALPHAGAN ) 0.2 % ophthalmic solution Place 1 drop into both eyes 2 (two) times daily.      Brimonidine -Dorzolamide  0.15-2 % SOLN Place 1 drop into the right eye daily.     furosemide  (LASIX ) 20 MG tablet Take 1 tablet (20 mg total) by mouth daily. 30 tablet 3   isosorbide  mononitrate (IMDUR ) 30 MG 24 hr tablet Take 0.5 tablets (15 mg total) by mouth daily. You can take an additional 15 mg as needed for chest pain. 120 tablet 3   latanoprost  (XALATAN ) 0.005 % ophthalmic solution Place 1 drop into both eyes at bedtime.     lisinopril  (  ZESTRIL ) 20 MG tablet Take 1 tablet (20 mg total) by mouth at bedtime. 90 tablet 3   Multiple Vitamin (MULTI VITAMIN) TABS daily at 6 (six) AM.     Multiple Vitamins-Minerals (MULTIVITAMINS THER. W/MINERALS) TABS Take 1 tablet by mouth daily.       nitroGLYCERIN  (NITROSTAT ) 0.4 MG SL tablet Place 1 tablet (0.4 mg total) under the tongue every 5 (five) minutes as needed for chest pain. 25 tablet 5   prednisoLONE acetate (PRED FORTE) 1 % ophthalmic suspension Place 1 drop into the right eye 4 (four) times daily.     sildenafil (VIAGRA) 100 MG tablet as needed for erectile dysfunction.     tamsulosin  (FLOMAX ) 0.4 MG CAPS capsule Take 0.4 mg by mouth daily after supper.      No current facility-administered medications for this visit.     PHYSICAL EXAMINATION: ECOG PERFORMANCE STATUS: 0 - Asymptomatic  Vitals:   12/31/23 1511  BP: (!) 120/57  Pulse: 67   Resp: 13  Temp: 98.6 F (37 C)   Filed Weights   12/31/23 1511  Weight: 170 lb 3.2 oz (77.2 kg)    GENERAL:alert, no distress and comfortable SKIN: skin color, texture, turgor are normal, no rashes or significant lesions EYES: normal, conjunctiva are pink and non-injected, sclera clear OROPHARYNX:no exudate, no erythema and lips, buccal mucosa, and tongue normal  NECK: supple, thyroid  normal size, non-tender, without nodularity LYMPH:  no palpable lymphadenopathy in the cervical, axillary or inguinal LUNGS: clear to auscultation and percussion with normal breathing effort HEART: regular rate & rhythm and no murmurs and no lower extremity edema ABDOMEN:abdomen soft, non-tender and normal bowel sounds Musculoskeletal:no cyanosis of digits and no clubbing  PSYCH: alert & oriented x 3 with fluent speech NEURO: no focal motor/sensory deficits  LABORATORY DATA:  I have reviewed the data as listed Lab Results  Component Value Date   WBC 9.2 12/31/2023   HGB 9.2 (L) 12/31/2023   HCT 30.5 (L) 12/31/2023   MCV 101.4 (H) 12/31/2023   PLT 169 12/31/2023     Chemistry      Component Value Date/Time   NA 138 12/31/2023 1601   NA 143 05/17/2023 1216   K 4.3 12/31/2023 1601   CL 106 12/31/2023 1601   CO2 27 12/31/2023 1601   BUN 27 (H) 12/31/2023 1601   BUN 22 05/17/2023 1216   CREATININE 1.74 (H) 12/31/2023 1601      Component Value Date/Time   CALCIUM 9.6 12/31/2023 1601   ALKPHOS 81 12/31/2023 1601   AST 19 12/31/2023 1601   ALT 10 12/31/2023 1601   BILITOT 1.4 (H) 12/31/2023 1601       RADIOGRAPHIC STUDIES: I have personally reviewed the radiological images as listed and agreed with the findings in the report. No results found.  All questions were answered. The patient knows to call the clinic with any problems, questions or concerns. I spent 45 minutes in the care of this patient including H and P, review of records, counseling and coordination of care.      Amber Stalls, MD 01/07/2024 12:12 PM

## 2024-01-01 LAB — FOLATE RBC
Folate, Hemolysate: 522 ng/mL
Folate, RBC: 1711 ng/mL (ref >498)
Hematocrit: 30.5 % — ABNORMAL LOW (ref 37.5–51.0)

## 2024-01-01 LAB — ANTINUCLEAR ANTIBODIES, IFA: ANA Ab, IFA: NEGATIVE

## 2024-01-01 LAB — PROTEIN ELECTROPHORESIS, SERUM, WITH REFLEX
A/G Ratio: 1.9 — ABNORMAL HIGH (ref 0.7–1.7)
Albumin ELP: 4.1 g/dL (ref 2.9–4.4)
Alpha-1-Globulin: 0.2 g/dL (ref 0.0–0.4)
Alpha-2-Globulin: 0.5 g/dL (ref 0.4–1.0)
Beta Globulin: 0.9 g/dL (ref 0.7–1.3)
Gamma Globulin: 0.7 g/dL (ref 0.4–1.8)
Globulin, Total: 2.2 g/dL (ref 2.2–3.9)
Total Protein ELP: 6.3 g/dL (ref 6.0–8.5)

## 2024-01-01 LAB — PSA, TOTAL AND FREE
PSA, Free Pct: 24 %
PSA, Free: 1.08 ng/mL
Prostate Specific Ag, Serum: 4.5 ng/mL — ABNORMAL HIGH (ref 0.0–4.0)

## 2024-01-01 LAB — FERRITIN: Ferritin: 491 ng/mL — ABNORMAL HIGH (ref 24–336)

## 2024-01-01 LAB — TSH: TSH: 2.03 u[IU]/mL (ref 0.350–4.500)

## 2024-01-07 ENCOUNTER — Inpatient Hospital Stay (HOSPITAL_BASED_OUTPATIENT_CLINIC_OR_DEPARTMENT_OTHER): Admitting: Hematology and Oncology

## 2024-01-07 DIAGNOSIS — D696 Thrombocytopenia, unspecified: Secondary | ICD-10-CM | POA: Diagnosis not present

## 2024-01-07 DIAGNOSIS — D539 Nutritional anemia, unspecified: Secondary | ICD-10-CM

## 2024-01-08 NOTE — Progress Notes (Signed)
 [facility] CONSULT NOTE  Patient Care Team: [name], FNP as PCP - General (Family Medicine)  CHIEF COMPLAINTS/PURPOSE OF CONSULTATION:  Macrocytic anemia and thrombocytopenia.  ASSESSMENT & PLAN:  Assessment and Plan Assessment & Plan Macrocytic anemia and thrombocytopenia, evaluation for myelodysplasia Macrocytic anemia with hemoglobin at 9.2 g/dL and thrombocytopenia. Differential includes myelodysplasia, vitamin B12 deficiency, thyroid  dysfunction, and chronic kidney disease. Myelodysplasia suspected but unconfirmed. This is a follow up telephone visit. Macrocytic anemia noted. No nutritional def, hypo/hyperthyroidism, autoimmune disease, SPEP neg. Will recommend we proceed with BMB. He is agreeable, this will be ordered. He will RTC in approximately 4 weeks.  Chronic kidney disease Potential contributor to anemia but not primary cause of hematological abnormalities.   HISTORY OF PRESENTING ILLNESS:  Jared [name] 78 y.o. male is here because of macrocytic anemia and thrombocytopenia.  Discussed the use of AI scribe software for clinical note transcription with the patient, who gave verbal consent to proceed.  History of Present Illness Jared [name] is an 78 year old male who presents for evaluation of low blood counts.  He has been experiencing increased fatigue and significant weight loss over the past few years, with his weight decreasing from approximately 250 pounds to 170 pounds. He also notes a decreased appetite, often eating only a small amount at dinner and feeling hungry again shortly after. Additionally, he experiences shortness of breath and persistent fatigue.  His medical history includes a hospitalization in December 2012 for right knee surgery, during which low platelet counts were noted but later returned to normal. He has a history of kidney function issues, initially thought to contribute to his anemia.  He has a history of arthritis,  prostate issues, high blood pressure, and has undergone back surgery, knee replacement, and hip replacement. He quit smoking a long time ago. His current medications include Flomax , amlodipine , and lisinopril . He recently stopped taking multivitamins.  He is retired, previously worked for a [employer] and [employer] for a total of 30 years. 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  Interval History  Jared [name] is here for a telephone visit. No new complaints.  MEDICAL HISTORY:  Past Medical History:  Diagnosis Date   Arthritis    knees and back   Enlarged prostate    takes Flomax    Headache    Hypertension    stress test done about 12 yrs ago - results wnl per pt.    SURGICAL HISTORY: Past Surgical History:  Procedure Laterality Date   BACK SURGERY  2002   CORONARY PRESSURE/FFR STUDY N/A 04/23/2023   Procedure: CORONARY PRESSURE/FFR STUDY;  Surgeon: Elmira Newman PARAS, MD;  Location: [facility];  Service: Cardiovascular;  Laterality: N/A;   JOINT REPLACEMENT     KNEE ARTHROSCOPY Left 05/2004   thelbert 09/27/2010   RIGHT/LEFT HEART CATH AND CORONARY ANGIOGRAPHY N/A 04/23/2023   Procedure: RIGHT/LEFT HEART CATH AND CORONARY ANGIOGRAPHY;  Surgeon: Elmira Newman PARAS, MD;  Location: [facility];  Service: Cardiovascular;  Laterality: N/A;   SHOULDER ARTHROSCOPY WITH OPEN ROTATOR CUFF REPAIR Right 2009   /notes 09/13/2010   TOTAL HIP ARTHROPLASTY Left 10/02/2017   TOTAL HIP ARTHROPLASTY Left 10/02/2017   Procedure: LEFT TOTAL HIP ARTHROPLASTY ANTERIOR APPROACH;  Surgeon: Liam Lerner, MD;  Location: [facility] OR;  Service: Orthopedics;  Laterality: Left;   TOTAL KNEE ARTHROPLASTY  04/16/2011   Procedure: TOTAL KNEE ARTHROPLASTY;  Surgeon: Lerner PARAS Liam;  Location: [facility] OR;  Service: Orthopedics;  Laterality: Left;  LEFT TOTAL KNEE ARTHROPLASTY    TOTAL KNEE ARTHROPLASTY Right 04/19/2014   Procedure: RIGHT TOTAL KNEE ARTHROPLASTY;  Surgeon: Dempsey JINNY Sensor, MD;   Location: [facility] OR;  Service: Orthopedics;  Laterality: Right;    SOCIAL HISTORY: Social History   Socioeconomic History   Marital status: Married    Spouse name: Not on file   Number of children: Not on file   Years of education: Not on file   Highest education level: Not on file  Occupational History   Not on file  Tobacco Use   Smoking status: Former    Current packs/day: 0.00    Average packs/day: 1 pack/day for 10.0 years (10.0 ttl pk-yrs)    Types: Cigarettes    Start date: 09/06/1964    Quit date: 09/07/1974    Years since quitting: 49.3   Smokeless tobacco: Never  Vaping Use   Vaping status: Never Used  Substance and Sexual Activity   Alcohol use: No    Comment: quit 1976 - beer   Drug use: No   Sexual activity: Not on file  Other Topics Concern   Not on file  Social History Narrative   Not on file   Social Drivers of Health   Financial Resource Strain: Not on file  Food Insecurity: Not on file  Transportation Needs: Not on file  Physical Activity: Not on file  Stress: Not on file  Social Connections: Not on file  Intimate Partner Violence: Not on file    FAMILY HISTORY: Family History  Problem Relation Age of Onset   Migraines Mother    Cancer Sister    Cancer Sister     ALLERGIES:  has no known allergies.  MEDICATIONS:  Current Outpatient Medications  Medication Sig Dispense Refill   acetaminophen  (TYLENOL ) 650 MG CR tablet 650 mg as needed for pain.     amLODipine  (NORVASC ) 10 MG tablet Take 10 mg by mouth daily.     aspirin  EC (ASPIRIN  LOW DOSE) 81 MG tablet TAKE 1 TABLET (81 MG TOTAL) BY MOUTH DAILY. SWALLOW WHOLE. 450 tablet 3   atorvastatin (LIPITOR) 10 MG tablet Take 10 mg by mouth daily.     Azelastine HCl 137 MCG/SPRAY SOLN Place into both nostrils as needed (allergies).     brimonidine  (ALPHAGAN ) 0.2 % ophthalmic solution Place 1 drop into both eyes 2 (two) times daily.      Brimonidine -Dorzolamide  0.15-2 % SOLN Place 1 drop into the  right eye daily.     furosemide  (LASIX ) 20 MG tablet Take 1 tablet (20 mg total) by mouth daily. 30 tablet 3   isosorbide  mononitrate (IMDUR ) 30 MG 24 hr tablet Take 0.5 tablets (15 mg total) by mouth daily. You can take an additional 15 mg as needed for chest pain. 120 tablet 3   latanoprost  (XALATAN ) 0.005 % ophthalmic solution Place 1 drop into both eyes at bedtime.     lisinopril  (ZESTRIL ) 20 MG tablet Take 1 tablet (20 mg total) by mouth at bedtime. 90 tablet 3   Multiple Vitamin (MULTI VITAMIN) TABS daily at 6 (six) AM.     Multiple Vitamins-Minerals (MULTIVITAMINS THER. W/MINERALS) TABS Take 1 tablet by mouth daily.       nitroGLYCERIN  (NITROSTAT ) 0.4 MG SL tablet Place 1 tablet (0.4 mg total) under the tongue every 5 (five) minutes as needed for chest pain. 25 tablet 5   prednisoLONE acetate (PRED FORTE) 1 % ophthalmic suspension Place 1 drop into the  right eye 4 (four) times daily.     sildenafil (VIAGRA) 100 MG tablet as needed for erectile dysfunction.     tamsulosin  (FLOMAX ) 0.4 MG CAPS capsule Take 0.4 mg by mouth daily after supper.      No current facility-administered medications for this visit.     PHYSICAL EXAMINATION: ECOG PERFORMANCE STATUS: 0 - Asymptomatic  There were no vitals filed for this visit.  There were no vitals filed for this visit.   Telephone visit  LABORATORY DATA:  I have reviewed the data as listed Lab Results  Component Value Date   WBC 9.2 12/31/2023   HGB 9.2 (L) 12/31/2023   HCT 30.5 (L) 12/31/2023   MCV 101.4 (H) 12/31/2023   PLT 169 12/31/2023     Chemistry      Component Value Date/Time   NA 138 12/31/2023 1601   NA 143 05/17/2023 1216   K 4.3 12/31/2023 1601   CL 106 12/31/2023 1601   CO2 27 12/31/2023 1601   BUN 27 (H) 12/31/2023 1601   BUN 22 05/17/2023 1216   CREATININE 1.74 (H) 12/31/2023 1601      Component Value Date/Time   CALCIUM 9.6 12/31/2023 1601   ALKPHOS 81 12/31/2023 1601   AST 19 12/31/2023 1601   ALT 10  12/31/2023 1601   BILITOT 1.4 (H) 12/31/2023 1601       RADIOGRAPHIC STUDIES: I have personally reviewed the radiological images as listed and agreed with the findings in the report. No results found.  All questions were answered. The patient knows to call the clinic with any problems, questions or concerns. I spent 10 minutes in the care of this patient including H and P, review of records, counseling and coordination of care.  Location of pt: Home Location of provider: Office.    Amber Stalls, MD 01/08/2024 8:36 AM

## 2024-01-19 NOTE — H&P (Signed)
 Chief Complaint: Macrocytic anemia/thrombocytopenia of uncertain etiology; referred for image guided bone marrow biopsy for further evaluation, rule out myelodysplasia  Referring Provider(s): Iruku,P  Supervising Physician: Karalee Beat  Patient Status: [facility] - Out-pt  History of Present Illness: Jared [name] is an 78 y.o. male with past medical history significant for arthritis, chronic kidney disease, BPH, hypertension, prior hip/knee arthroplasties, back surgery presenting now with macrocytic anemia and thrombocytopenia of uncertain etiology.  He is scheduled today for image guided bone marrow biopsy for further evaluation, rule out myelodysplasia.  *** Patient is Full Code  Past Medical History:  Diagnosis Date   Arthritis    knees and back   Enlarged prostate    takes Flomax    Headache    Hypertension    stress test done about 12 yrs ago - results wnl per pt.    Past Surgical History:  Procedure Laterality Date   BACK SURGERY  2002   CORONARY PRESSURE/FFR STUDY N/A 04/23/2023   Procedure: CORONARY PRESSURE/FFR STUDY;  Surgeon: Elmira Newman PARAS, MD;  Location: [facility];  Service: Cardiovascular;  Laterality: N/A;   JOINT REPLACEMENT     KNEE ARTHROSCOPY Left 05/2004   thelbert 09/27/2010   RIGHT/LEFT HEART CATH AND CORONARY ANGIOGRAPHY N/A 04/23/2023   Procedure: RIGHT/LEFT HEART CATH AND CORONARY ANGIOGRAPHY;  Surgeon: Elmira Newman PARAS, MD;  Location: [facility];  Service: Cardiovascular;  Laterality: N/A;   SHOULDER ARTHROSCOPY WITH OPEN ROTATOR CUFF REPAIR Right 2009   /notes 09/13/2010   TOTAL HIP ARTHROPLASTY Left 10/02/2017   TOTAL HIP ARTHROPLASTY Left 10/02/2017   Procedure: LEFT TOTAL HIP ARTHROPLASTY ANTERIOR APPROACH;  Surgeon: Liam Lerner, MD;  Location: [facility];  Service: Orthopedics;  Laterality: Left;   TOTAL KNEE ARTHROPLASTY  04/16/2011   Procedure: TOTAL KNEE ARTHROPLASTY;  Surgeon: Lerner PARAS Liam;  Location: [facility];  Service:  Orthopedics;  Laterality: Left;  LEFT TOTAL KNEE ARTHROPLASTY    TOTAL KNEE ARTHROPLASTY Right 04/19/2014   Procedure: RIGHT TOTAL KNEE ARTHROPLASTY;  Surgeon: Lerner PARAS Liam, MD;  Location: [facility] OR;  Service: Orthopedics;  Laterality: Right;    Allergies: Patient has no known allergies.  Medications: Prior to Admission medications   Medication Sig Start Date End Date Taking? Authorizing Provider  acetaminophen  (TYLENOL ) 650 MG CR tablet 650 mg as needed for pain.    [provider]  amLODipine  (NORVASC ) 10 MG tablet Take 10 mg by mouth daily.    [provider]  aspirin  EC (ASPIRIN  LOW DOSE) 81 MG tablet TAKE 1 TABLET (81 MG TOTAL) BY MOUTH DAILY. SWALLOW WHOLE. 09/05/23   Patwardhan, Newman PARAS, MD  atorvastatin (LIPITOR) 10 MG tablet Take 10 mg by mouth daily. 12/22/21   [provider]  Azelastine HCl 137 MCG/SPRAY SOLN Place into both nostrils as needed (allergies). 05/15/23   [provider]  brimonidine  (ALPHAGAN ) 0.2 % ophthalmic solution Place 1 drop into both eyes 2 (two) times daily.     [provider]  Brimonidine -Dorzolamide  0.15-2 % SOLN Place 1 drop into the right eye daily.    [provider]  furosemide  (LASIX ) 20 MG tablet Take 1 tablet (20 mg total) by mouth daily. 04/23/23 04/22/24  Patwardhan, Newman PARAS, MD  isosorbide  mononitrate (IMDUR ) 30 MG 24 hr tablet Take 0.5 tablets (15 mg total) by mouth daily. You can take an additional 15 mg as needed for chest pain. 05/17/23 12/31/23  Wyn Jackee VEAR Mickey., NP  latanoprost  (XALATAN ) 0.005 % ophthalmic  solution Place 1 drop into both eyes at bedtime.    [provider]  lisinopril  (ZESTRIL ) 20 MG tablet Take 1 tablet (20 mg total) by mouth at bedtime. 05/29/23   Wyn Jackee VEAR Mickey., NP  Multiple Vitamin (MULTI VITAMIN) TABS daily at 6 (six) AM.    [provider]  Multiple Vitamins-Minerals (MULTIVITAMINS THER. W/MINERALS) TABS Take 1 tablet by mouth daily.      [provider]  nitroGLYCERIN  (NITROSTAT ) 0.4 MG SL tablet Place 1 tablet (0.4 mg total) under the tongue every 5 (five) minutes as needed for chest pain. 04/17/23   Patwardhan, Newman PARAS, MD  prednisoLONE acetate (PRED FORTE) 1 % ophthalmic suspension Place 1 drop into the right eye 4 (four) times daily. 05/03/23   [provider]  sildenafil (VIAGRA) 100 MG tablet as needed for erectile dysfunction.    [provider]  tamsulosin  (FLOMAX ) 0.4 MG CAPS capsule Take 0.4 mg by mouth daily after supper.     [provider]     Family History  Problem Relation Age of Onset   Migraines Mother    Cancer Sister    Cancer Sister     Social History   Socioeconomic History   Marital status: Married    Spouse name: Not on file   Number of children: Not on file   Years of education: Not on file   Highest education level: Not on file  Occupational History   Not on file  Tobacco Use   Smoking status: Former    Current packs/day: 0.00    Average packs/day: 1 pack/day for 10.0 years (10.0 ttl pk-yrs)    Types: Cigarettes    Start date: 09/06/1964    Quit date: 09/07/1974    Years since quitting: 49.4   Smokeless tobacco: Never  Vaping Use   Vaping status: Never Used  Substance and Sexual Activity   Alcohol use: No    Comment: quit 1976 - beer   Drug use: No   Sexual activity: Not on file  Other Topics Concern   Not on file  Social History Narrative   Not on file   Social Drivers of Health   Financial Resource Strain: Not on file  Food Insecurity: Not on file  Transportation Needs: Not on file  Physical Activity: Not on file  Stress: Not on file  Social Connections: Not on file       Review of Systems  Vital Signs:   Advance Care Plan: No documents on file   Physical Exam  Imaging: No results found.  Labs:  CBC: Recent Labs    04/17/23 1042 04/23/23 0955 05/17/23 1216 12/31/23 1601 12/31/23 1602  WBC 4.6  --  5.5 9.2  --   HGB  10.0* 10.2*  10.5* 9.5* 9.2*  --   HCT 30.9* 30.0*  31.0* 29.2* 29.6* 30.5*  PLT 134*  --  132* 169  --     COAGS: No results for input(s): INR, APTT in the last 8760 hours.  BMP: Recent Labs    04/17/23 1042 04/23/23 0955 05/17/23 1216 12/31/23 1601  NA 139 141  141 143 138  K 4.2 3.9  3.9 4.6 4.3  CL 105  --  105 106  CO2 24  --  22 27  GLUCOSE 89  --  93 96  BUN 13  --  22 27*  CALCIUM 9.3  --  9.5 9.6  CREATININE 1.17  --  1.43* 1.74*  GFRNONAA  --   --   --  39*    LIVER FUNCTION TESTS: Recent Labs    12/31/23 1601  BILITOT 1.4*  AST 19  ALT 10  ALKPHOS 81  PROT 6.6  ALBUMIN 4.6    TUMOR MARKERS: No results for input(s): AFPTM, CEA, CA199, CHROMGRNA in the last 8760 hours.  Assessment and Plan: 78 y.o. male with past medical history significant for arthritis, chronic kidney disease, BPH, hypertension, prior hip/knee arthroplasties, back surgery presenting now with macrocytic anemia and thrombocytopenia of uncertain etiology.  He is scheduled today for image guided bone marrow biopsy for further evaluation, rule out myelodysplasia.Risks and benefits of procedure was discussed with the patient  including, but not limited to bleeding, infection, damage to adjacent structures or low yield requiring additional tests.  All of the questions were answered and there is agreement to proceed.  Consent signed and in chart.    Thank you for allowing our service to participate in DAESHAUN SPECHT 's care.  Electronically Signed: D. Franky Rakers, PA-C   01/19/2024, 12:31 PM      I spent a total of   15 minutes  in face to face in clinical consultation, greater than 50% of which was counseling/coordinating care for image guided bone marrow biopsy

## 2024-01-21 ENCOUNTER — Encounter (HOSPITAL_COMMUNITY): Payer: Self-pay

## 2024-01-21 ENCOUNTER — Ambulatory Visit (HOSPITAL_COMMUNITY)
Admission: RE | Admit: 2024-01-21 | Discharge: 2024-01-21 | Disposition: A | Discharge status: Home / Self Care | Source: Ambulatory Visit | Attending: Hematology and Oncology

## 2024-01-21 ENCOUNTER — Other Ambulatory Visit: Payer: Self-pay

## 2024-01-21 ENCOUNTER — Ambulatory Visit (HOSPITAL_COMMUNITY)
Admission: RE | Admit: 2024-01-21 | Discharge: 2024-01-21 | Disposition: A | Discharge status: Home / Self Care | Source: Ambulatory Visit | Attending: Hematology and Oncology | Admitting: Hematology and Oncology

## 2024-01-21 VITALS — BP 107/63 | HR 65 | Temp 98.5°F | Resp 16 | Ht 71.0 in | Wt 170.0 lb

## 2024-01-21 DIAGNOSIS — D469 Myelodysplastic syndrome, unspecified: Secondary | ICD-10-CM | POA: Diagnosis not present

## 2024-01-21 DIAGNOSIS — I129 Hypertensive chronic kidney disease with stage 1 through stage 4 chronic kidney disease, or unspecified chronic kidney disease: Secondary | ICD-10-CM | POA: Insufficient documentation

## 2024-01-21 DIAGNOSIS — D696 Thrombocytopenia, unspecified: Secondary | ICD-10-CM | POA: Diagnosis not present

## 2024-01-21 DIAGNOSIS — Z79899 Other long term (current) drug therapy: Secondary | ICD-10-CM | POA: Diagnosis not present

## 2024-01-21 DIAGNOSIS — Z87891 Personal history of nicotine dependence: Secondary | ICD-10-CM | POA: Diagnosis not present

## 2024-01-21 DIAGNOSIS — D539 Nutritional anemia, unspecified: Secondary | ICD-10-CM | POA: Diagnosis not present

## 2024-01-21 DIAGNOSIS — M1710 Unilateral primary osteoarthritis, unspecified knee: Secondary | ICD-10-CM | POA: Diagnosis not present

## 2024-01-21 DIAGNOSIS — N189 Chronic kidney disease, unspecified: Secondary | ICD-10-CM | POA: Insufficient documentation

## 2024-01-21 DIAGNOSIS — D72829 Elevated white blood cell count, unspecified: Secondary | ICD-10-CM | POA: Diagnosis not present

## 2024-01-21 DIAGNOSIS — N400 Enlarged prostate without lower urinary tract symptoms: Secondary | ICD-10-CM | POA: Insufficient documentation

## 2024-01-21 DIAGNOSIS — Z7982 Long term (current) use of aspirin: Secondary | ICD-10-CM | POA: Diagnosis not present

## 2024-01-21 DIAGNOSIS — D649 Anemia, unspecified: Secondary | ICD-10-CM

## 2024-01-21 DIAGNOSIS — D72828 Other elevated white blood cell count: Secondary | ICD-10-CM | POA: Diagnosis not present

## 2024-01-21 DIAGNOSIS — D7589 Other specified diseases of blood and blood-forming organs: Secondary | ICD-10-CM | POA: Diagnosis not present

## 2024-01-21 LAB — CBC WITH DIFFERENTIAL/PLATELET
Abs Immature Granulocytes: 0.6 K/uL — ABNORMAL HIGH (ref 0.00–0.07)
Basophils Absolute: 0.1 K/uL (ref 0.0–0.1)
Basophils Relative: 1 %
Eosinophils Absolute: 0.9 K/uL — ABNORMAL HIGH (ref 0.0–0.5)
Eosinophils Relative: 6 %
HCT: 27.5 % — ABNORMAL LOW (ref 39.0–52.0)
Hemoglobin: 8 g/dL — ABNORMAL LOW (ref 13.0–17.0)
Lymphocytes Relative: 10 %
Lymphs Abs: 1.5 K/uL (ref 0.7–4.0)
MCH: 30.5 pg (ref 26.0–34.0)
MCHC: 29.1 g/dL — ABNORMAL LOW (ref 30.0–36.0)
MCV: 105 fL — ABNORMAL HIGH (ref 80.0–100.0)
Metamyelocytes Relative: 2 %
Monocytes Absolute: 0 K/uL — ABNORMAL LOW (ref 0.1–1.0)
Monocytes Relative: 0 %
Myelocytes: 2 %
Neutro Abs: 11.4 K/uL — ABNORMAL HIGH (ref 1.7–7.7)
Neutrophils Relative %: 77 %
Other: 2 %
Platelets: 115 K/uL — ABNORMAL LOW (ref 150–400)
RBC: 2.62 MIL/uL — ABNORMAL LOW (ref 4.22–5.81)
RDW: 25.7 % — ABNORMAL HIGH (ref 11.5–15.5)
WBC: 14.8 K/uL — ABNORMAL HIGH (ref 4.0–10.5)
nRBC: 2.1 % — ABNORMAL HIGH (ref 0.0–0.2)

## 2024-01-21 LAB — PATHOLOGIST SMEAR REVIEW

## 2024-01-21 MED ORDER — MIDAZOLAM HCL 2 MG/2ML IJ SOLN
INTRAMUSCULAR | Status: AC | PRN
  Administered 2024-01-21 (×2): 1 mg via INTRAVENOUS

## 2024-01-21 MED ORDER — SODIUM CHLORIDE 0.9 % IV SOLN: INTRAVENOUS | Status: DC

## 2024-01-21 MED ORDER — FENTANYL CITRATE (PF) 100 MCG/2ML IJ SOLN
INTRAMUSCULAR | Status: AC
  Filled 2024-01-21: qty 2

## 2024-01-21 MED ORDER — FENTANYL CITRATE (PF) 100 MCG/2ML IJ SOLN
INTRAMUSCULAR | Status: AC | PRN
  Administered 2024-01-21 (×2): 50 ug via INTRAVENOUS

## 2024-01-21 MED ORDER — MIDAZOLAM HCL 2 MG/2ML IJ SOLN
INTRAMUSCULAR | Status: AC
  Filled 2024-01-21: qty 4

## 2024-01-21 NOTE — Procedures (Signed)
 Interventional Radiology Procedure Note  Procedure: CT BONE MARROW ASP AND CORE    Complications: None  Estimated Blood Loss:  MIN  Findings: 11 G CORE AND ASP    M. TREVOR Cambren Helm, MD

## 2024-01-24 ENCOUNTER — Ambulatory Visit: Payer: Self-pay | Admitting: Hematology and Oncology

## 2024-01-24 LAB — SURGICAL PATHOLOGY

## 2024-01-27 DIAGNOSIS — R7303 Prediabetes: Secondary | ICD-10-CM | POA: Diagnosis not present

## 2024-01-27 DIAGNOSIS — Z0000 Encounter for general adult medical examination without abnormal findings: Secondary | ICD-10-CM | POA: Diagnosis not present

## 2024-01-27 DIAGNOSIS — E7800 Pure hypercholesterolemia, unspecified: Secondary | ICD-10-CM | POA: Diagnosis not present

## 2024-01-27 DIAGNOSIS — R5383 Other fatigue: Secondary | ICD-10-CM | POA: Diagnosis not present

## 2024-01-27 DIAGNOSIS — N1831 Chronic kidney disease, stage 3a: Secondary | ICD-10-CM | POA: Diagnosis not present

## 2024-01-27 DIAGNOSIS — I10 Essential (primary) hypertension: Secondary | ICD-10-CM | POA: Diagnosis not present

## 2024-01-27 DIAGNOSIS — D709 Neutropenia, unspecified: Secondary | ICD-10-CM | POA: Diagnosis not present

## 2024-01-27 DIAGNOSIS — I739 Peripheral vascular disease, unspecified: Secondary | ICD-10-CM | POA: Diagnosis not present

## 2024-01-27 DIAGNOSIS — D649 Anemia, unspecified: Secondary | ICD-10-CM | POA: Diagnosis not present

## 2024-01-29 ENCOUNTER — Encounter (HOSPITAL_COMMUNITY): Payer: Self-pay | Admitting: Hematology and Oncology

## 2024-01-31 DIAGNOSIS — N1832 Chronic kidney disease, stage 3b: Secondary | ICD-10-CM | POA: Diagnosis not present

## 2024-02-03 ENCOUNTER — Other Ambulatory Visit: Payer: Self-pay

## 2024-02-03 DIAGNOSIS — N1832 Chronic kidney disease, stage 3b: Secondary | ICD-10-CM

## 2024-02-12 DIAGNOSIS — I10 Essential (primary) hypertension: Secondary | ICD-10-CM | POA: Diagnosis not present

## 2024-02-12 DIAGNOSIS — N1831 Chronic kidney disease, stage 3a: Secondary | ICD-10-CM | POA: Diagnosis not present

## 2024-02-13 ENCOUNTER — Telehealth: Payer: Self-pay

## 2024-02-13 NOTE — Telephone Encounter (Signed)
 Spoke with patient and confirmed appointment on 10/3

## 2024-02-14 ENCOUNTER — Ambulatory Visit: Payer: Self-pay | Admitting: Hematology and Oncology

## 2024-02-14 ENCOUNTER — Inpatient Hospital Stay: Attending: Hematology and Oncology | Admitting: Hematology and Oncology

## 2024-02-14 ENCOUNTER — Inpatient Hospital Stay

## 2024-02-14 ENCOUNTER — Telehealth: Payer: Self-pay | Admitting: *Deleted

## 2024-02-14 ENCOUNTER — Other Ambulatory Visit: Payer: Self-pay | Admitting: *Deleted

## 2024-02-14 VITALS — BP 120/58 | HR 79 | Temp 98.3°F | Resp 16 | Ht 71.0 in | Wt 170.3 lb

## 2024-02-14 DIAGNOSIS — D461 Refractory anemia with ring sideroblasts: Secondary | ICD-10-CM | POA: Diagnosis present

## 2024-02-14 DIAGNOSIS — D46Z Other myelodysplastic syndromes: Secondary | ICD-10-CM

## 2024-02-14 DIAGNOSIS — C9110 Chronic lymphocytic leukemia of B-cell type not having achieved remission: Secondary | ICD-10-CM | POA: Insufficient documentation

## 2024-02-14 DIAGNOSIS — Z79899 Other long term (current) drug therapy: Secondary | ICD-10-CM | POA: Diagnosis not present

## 2024-02-14 DIAGNOSIS — Z809 Family history of malignant neoplasm, unspecified: Secondary | ICD-10-CM | POA: Diagnosis not present

## 2024-02-14 DIAGNOSIS — N189 Chronic kidney disease, unspecified: Secondary | ICD-10-CM | POA: Insufficient documentation

## 2024-02-14 DIAGNOSIS — D539 Nutritional anemia, unspecified: Secondary | ICD-10-CM

## 2024-02-14 DIAGNOSIS — Z87891 Personal history of nicotine dependence: Secondary | ICD-10-CM | POA: Insufficient documentation

## 2024-02-14 DIAGNOSIS — D696 Thrombocytopenia, unspecified: Secondary | ICD-10-CM | POA: Diagnosis not present

## 2024-02-14 LAB — CBC WITH DIFFERENTIAL/PLATELET
Abs Immature Granulocytes: 2.15 K/uL — ABNORMAL HIGH (ref 0.00–0.07)
Basophils Absolute: 0.2 K/uL — ABNORMAL HIGH (ref 0.0–0.1)
Basophils Relative: 1 %
Eosinophils Absolute: 0 K/uL (ref 0.0–0.5)
Eosinophils Relative: 0 %
HCT: 24.1 % — ABNORMAL LOW (ref 39.0–52.0)
Hemoglobin: 7.3 g/dL — ABNORMAL LOW (ref 13.0–17.0)
Immature Granulocytes: 9 %
Lymphocytes Relative: 7 %
Lymphs Abs: 1.6 K/uL (ref 0.7–4.0)
MCH: 30.8 pg (ref 26.0–34.0)
MCHC: 30.3 g/dL (ref 30.0–36.0)
MCV: 101.7 fL — ABNORMAL HIGH (ref 80.0–100.0)
Monocytes Absolute: 1 K/uL (ref 0.1–1.0)
Monocytes Relative: 4 %
Neutro Abs: 19 K/uL — ABNORMAL HIGH (ref 1.7–7.7)
Neutrophils Relative %: 79 %
Platelets: 203 K/uL (ref 150–400)
RBC: 2.37 MIL/uL — ABNORMAL LOW (ref 4.22–5.81)
RDW: 28.1 % — ABNORMAL HIGH (ref 11.5–15.5)
WBC: 24 K/uL — ABNORMAL HIGH (ref 4.0–10.5)
nRBC: 2.8 % — ABNORMAL HIGH (ref 0.0–0.2)

## 2024-02-14 LAB — PREPARE RBC (CROSSMATCH)

## 2024-02-14 LAB — TSH: TSH: 3.18 u[IU]/mL (ref 0.350–4.500)

## 2024-02-14 LAB — SAMPLE TO BLOOD BANK

## 2024-02-14 NOTE — Telephone Encounter (Signed)
 Sent message for MDS FISH to [facility] on pathology from 01/21/2024

## 2024-02-14 NOTE — Progress Notes (Signed)
 [facility] CONSULT NOTE  Patient Care Team: [name], FNP as PCP - General (Family Medicine)  CHIEF COMPLAINTS/PURPOSE OF CONSULTATION:  Macrocytic anemia and thrombocytopenia.  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 Bone marrow transplant not feasible due to age and medical fitness. - Recommend supportive care and we will consider hypomethylating agents down the lane if he has worsening multiple cytopenias. - Labs today, consider PRBC transfusion to maintain a Hb of 8 g/dl - Order weekly EPO or darbepoietin to improve hemoglobin. - EPO levels to be drawn today, will conder ESA if Epo levels less than 200 mu/ml - Dose of EPO alpha is 40000-60000 units/week [place],  - He understands that this is not a curative treatment and we have discussed the prognosis.  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 - I dont plan to treat this at this time. I will continue to monitor this.   HISTORY OF PRESENTING ILLNESS:  Jared [name] 78 y.o. male is here because of macrocytic anemia and thrombocytopenia.  Discussed the use of AI scribe software for clinical note transcription with the patient, who gave verbal consent to proceed.  History of Present Illness  Jared [name] is an 78 year old male with new diagnosis of myelodysplastic syndrome and chronic lymphocytic leukemia who presents with fatigue and evaluation of his blood conditions.  He reports significant fatigue. Otherwise no new complaints. Wife is with him. Since his last visit here, he had BMB and is here to discuss results.  No bleeding issues. No falls.  Rest of the pertinent 10 point ROS reviewed and neg.  MEDICAL HISTORY:  Past Medical History:  Diagnosis Date   Arthritis    knees and back   Enlarged prostate    takes Flomax    Headache     Hypertension    stress test done about 12 yrs ago - results wnl per pt.    SURGICAL HISTORY: Past Surgical History:  Procedure Laterality Date   BACK SURGERY  2002   CORONARY PRESSURE/FFR STUDY N/A 04/23/2023   Procedure: CORONARY PRESSURE/FFR STUDY;  Surgeon: Elmira Newman PARAS, MD;  [facility];  Service: Cardiovascular;  Laterality: N/A;   JOINT REPLACEMENT     KNEE ARTHROSCOPY Left 05/2004   thelbert 09/27/2010   RIGHT/LEFT HEART CATH AND CORONARY ANGIOGRAPHY N/A 04/23/2023   Procedure: RIGHT/LEFT HEART CATH AND CORONARY ANGIOGRAPHY;  Surgeon: Elmira Newman PARAS, MD;  [facility]: [facility];  Service: Cardiovascular;  Laterality: N/A;   SHOULDER ARTHROSCOPY WITH OPEN ROTATOR CUFF REPAIR Right 2009   /notes 09/13/2010   TOTAL HIP ARTHROPLASTY Left 10/02/2017   TOTAL HIP ARTHROPLASTY Left 10/02/2017   Procedure: LEFT TOTAL HIP ARTHROPLASTY ANTERIOR APPROACH;  Surgeon: Liam Lerner, MD;  Location: [facility] OR;  Service: Orthopedics;  Laterality: Left;   TOTAL KNEE ARTHROPLASTY  04/16/2011   Procedure: TOTAL KNEE ARTHROPLASTY;  Surgeon: Lerner PARAS Liam;  Location: [facility] OR;  Service: Orthopedics;  Laterality: Left;  LEFT TOTAL KNEE ARTHROPLASTY    TOTAL KNEE ARTHROPLASTY Right 04/19/2014   Procedure: RIGHT TOTAL KNEE ARTHROPLASTY;  Surgeon: Lerner PARAS Liam, MD;  Location: [facility] OR;  Service: Orthopedics;  Laterality: Right;    SOCIAL HISTORY: Social History   Socioeconomic History   Marital status: Married    Spouse name: Not on file  Number of children: Not on file   Years of education: Not on file   Highest education level: Not on file  Occupational History   Not on file  Tobacco Use   Smoking status: Former    Current packs/day: 0.00    Average packs/day: 1 pack/day for 10.0 years (10.0 ttl pk-yrs)    Types: Cigarettes    Start date: 09/06/1964    Quit date: 09/07/1974    Years since quitting: 49.4   Smokeless tobacco: Never  Vaping Use   Vaping status: Never Used   Substance and Sexual Activity   Alcohol use: No    Comment: quit 1976 - beer   Drug use: No   Sexual activity: Not on file  Other Topics Concern   Not on file  Social History Narrative   Not on file   Social Drivers of Health   Financial Resource Strain: Not on file  Food Insecurity: Not on file  Transportation Needs: Not on file  Physical Activity: Not on file  Stress: Not on file  Social Connections: Not on file  Intimate Partner Violence: Not on file    FAMILY HISTORY: Family History  Problem Relation Age of Onset   Migraines Mother    Cancer Sister    Cancer Sister     ALLERGIES:  has no known allergies.  MEDICATIONS:  Current Outpatient Medications  Medication Sig Dispense Refill   acetaminophen  (TYLENOL ) 650 MG CR tablet 650 mg as needed for pain.     amLODipine  (NORVASC ) 10 MG tablet Take 10 mg by mouth daily.     aspirin  EC (ASPIRIN  LOW DOSE) 81 MG tablet TAKE 1 TABLET (81 MG TOTAL) BY MOUTH DAILY. SWALLOW WHOLE. 450 tablet 3   atorvastatin (LIPITOR) 10 MG tablet Take 10 mg by mouth daily.     Azelastine HCl 137 MCG/SPRAY SOLN Place into both nostrils as needed (allergies).     brimonidine  (ALPHAGAN ) 0.2 % ophthalmic solution Place 1 drop into both eyes 2 (two) times daily.      Brimonidine -Dorzolamide  0.15-2 % SOLN Place 1 drop into the right eye daily.     empagliflozin (JARDIANCE) 10 MG TABS tablet Take 10 mg by mouth daily.     furosemide  (LASIX ) 20 MG tablet Take 1 tablet (20 mg total) by mouth daily. 30 tablet 3   isosorbide  mononitrate (IMDUR ) 30 MG 24 hr tablet Take 0.5 tablets (15 mg total) by mouth daily. You can take an additional 15 mg as needed for chest pain. 120 tablet 3   latanoprost  (XALATAN ) 0.005 % ophthalmic solution Place 1 drop into both eyes at bedtime.     lisinopril  (ZESTRIL ) 20 MG tablet Take 1 tablet (20 mg total) by mouth at bedtime. 90 tablet 3   Multiple Vitamin (MULTI VITAMIN) TABS daily at 6 (six) AM.     Multiple  Vitamins-Minerals (MULTIVITAMINS THER. W/MINERALS) TABS Take 1 tablet by mouth daily.       nitroGLYCERIN  (NITROSTAT ) 0.4 MG SL tablet Place 1 tablet (0.4 mg total) under the tongue every 5 (five) minutes as needed for chest pain. 25 tablet 5   prednisoLONE acetate (PRED FORTE) 1 % ophthalmic suspension Place 1 drop into the right eye 4 (four) times daily.     sildenafil (VIAGRA) 100 MG tablet as needed for erectile dysfunction.     tamsulosin  (FLOMAX ) 0.4 MG CAPS capsule Take 0.4 mg by mouth daily after supper.      No current facility-administered medications for this visit.  PHYSICAL EXAMINATION: ECOG PERFORMANCE STATUS: 0 - Asymptomatic  Vitals:   02/14/24 1005  BP: (!) 120/58  Pulse: 79  Resp: 16  Temp: 98.3 F (36.8 C)  SpO2: 100%    Filed Weights   02/14/24 1005  Weight: 170 lb 4.8 oz (77.2 kg)     Physical Exam Constitutional:      Appearance: Normal appearance.  Neurological:     Mental Status: He is alert.      LABORATORY DATA:  I have reviewed the data as listed Lab Results  Component Value Date   WBC 14.8 (H) 01/21/2024   HGB 8.0 (L) 01/21/2024   HCT 27.5 (L) 01/21/2024   MCV 105.0 (H) 01/21/2024   PLT 115 (L) 01/21/2024     Chemistry      Component Value Date/Time   NA 138 12/31/2023 1601   NA 143 05/17/2023 1216   K 4.3 12/31/2023 1601   CL 106 12/31/2023 1601   CO2 27 12/31/2023 1601   BUN 27 (H) 12/31/2023 1601   BUN 22 05/17/2023 1216   CREATININE 1.74 (H) 12/31/2023 1601      Component Value Date/Time   CALCIUM 9.6 12/31/2023 1601   ALKPHOS 81 12/31/2023 1601   AST 19 12/31/2023 1601   ALT 10 12/31/2023 1601   BILITOT 1.4 (H) 12/31/2023 1601       RADIOGRAPHIC STUDIES: I have personally reviewed the radiological images as listed and agreed with the findings in the report. CT BONE MARROW BIOPSY & ASPIRATION Result Date: 01/21/2024 INDICATION: ANEMIA, THROMBOCYTOPENIA, CONCERN FOR MILD DYSPLASTIC SYNDROME EXAM: CT GUIDED RIGHT  ILIAC BONE MARROW ASPIRATION AND CORE BIOPSY Date:  01/21/2024 01/21/2024 11:52 am Radiologist:  M. Frederic Specking, MD Guidance:  CT FLUOROSCOPY: Fluoroscopy Time: None. MEDICATIONS: 1% lidocaine  local ANESTHESIA/SEDATION: 2.0 mg IV Versed ; 100 mcg IV Fentanyl  Moderate Sedation Time:  10 minutes The patient was continuously monitored during the procedure by the interventional radiology nurse under my direct supervision. CONTRAST:  None. COMPLICATIONS: None PROCEDURE: Informed consent was obtained from the patient following explanation of the procedure, risks, benefits and alternatives. The patient understands, agrees and consents for the procedure. All questions were addressed. A time out was performed. The patient was positioned prone and non-contrast localization CT was performed of the pelvis to demonstrate the iliac marrow spaces. Maximal barrier sterile technique utilized including caps, mask, sterile gowns, sterile gloves, large sterile drape, hand hygiene, and Betadine prep. Under sterile conditions and local anesthesia, an 11 gauge coaxial bone biopsy needle was advanced into the right iliac marrow space. Needle position was confirmed with CT imaging. Initially, bone marrow aspiration was performed. Next, the 11 gauge outer cannula was utilized to obtain a right iliac bone marrow core biopsy. Needle was removed. Hemostasis was obtained with compression. The patient tolerated the procedure well. Samples were prepared with the cytotechnologist. No immediate complications. IMPRESSION: CT guided right iliac bone marrow aspiration and core biopsy. Electronically Signed   By: CHRISTELLA.  Shick M.D.   On: 01/21/2024 12:36    All questions were answered. The patient knows to call the clinic with any problems, questions or concerns.     Amber Stalls, MD 02/14/2024 10:22 AM

## 2024-02-14 NOTE — Addendum Note (Signed)
 Addended by: Brandan Robicheaux, NAGA VENKATA KALIPRAVEENA on: 02/14/2024 01:50 PM   Modules accepted: Orders

## 2024-02-14 NOTE — Progress Notes (Signed)
 Called pt to make aware that he will HGB was 7.3 and provider would like for him to receive 1 unit of prbc's. Advised to be here by 0815 for 0830 appt on 10/6 and to keep blue bracelet. Pt verbalized understanding.

## 2024-02-15 LAB — ERYTHROPOIETIN: Erythropoietin: 26.4 m[IU]/mL — ABNORMAL HIGH (ref 2.6–18.5)

## 2024-02-17 ENCOUNTER — Inpatient Hospital Stay

## 2024-02-17 DIAGNOSIS — D461 Refractory anemia with ring sideroblasts: Secondary | ICD-10-CM | POA: Diagnosis not present

## 2024-02-17 DIAGNOSIS — D46Z Other myelodysplastic syndromes: Secondary | ICD-10-CM

## 2024-02-17 DIAGNOSIS — D539 Nutritional anemia, unspecified: Secondary | ICD-10-CM

## 2024-02-17 MED ORDER — SODIUM CHLORIDE 0.9% IV SOLUTION
250.0000 mL | INTRAVENOUS | Status: DC
  Administered 2024-02-17: 100 mL via INTRAVENOUS

## 2024-02-17 MED ORDER — ACETAMINOPHEN 325 MG PO TABS
650.0000 mg | ORAL_TABLET | Freq: Once | ORAL | Status: AC
  Administered 2024-02-17: 650 mg via ORAL
  Filled 2024-02-17: qty 2

## 2024-02-17 MED ORDER — DIPHENHYDRAMINE HCL 25 MG PO CAPS
25.0000 mg | ORAL_CAPSULE | Freq: Once | ORAL | Status: AC
  Administered 2024-02-17: 25 mg via ORAL
  Filled 2024-02-17: qty 1

## 2024-02-17 NOTE — Patient Instructions (Signed)

## 2024-02-18 ENCOUNTER — Other Ambulatory Visit: Payer: Self-pay | Admitting: Hematology and Oncology

## 2024-02-18 DIAGNOSIS — D469 Myelodysplastic syndrome, unspecified: Secondary | ICD-10-CM | POA: Insufficient documentation

## 2024-02-18 LAB — TYPE AND SCREEN
ABO/RH(D): O POS
Antibody Screen: NEGATIVE
Unit division: 0
Unit division: 0

## 2024-02-18 LAB — BPAM RBC
Blood Product Expiration Date: 202511052359
Blood Product Unit Number: 202510092359
ISSUE DATE / TIME: 202510051546
PRODUCT CODE: 202510060931
PRODUCT CODE: 202511052359
Unit Type and Rh: 5100
Unit Type and Rh: 202510092359
Unit Type and Rh: 5100
Unit Type and Rh: 5100

## 2024-02-21 ENCOUNTER — Inpatient Hospital Stay

## 2024-02-21 VITALS — BP 136/67 | HR 81 | Temp 98.7°F | Resp 18

## 2024-02-21 DIAGNOSIS — D469 Myelodysplastic syndrome, unspecified: Secondary | ICD-10-CM

## 2024-02-21 DIAGNOSIS — R531 Weakness: Secondary | ICD-10-CM | POA: Diagnosis not present

## 2024-02-21 DIAGNOSIS — K2971 Gastritis, unspecified, with bleeding: Secondary | ICD-10-CM | POA: Diagnosis not present

## 2024-02-21 DIAGNOSIS — D539 Nutritional anemia, unspecified: Secondary | ICD-10-CM

## 2024-02-21 DIAGNOSIS — D46Z Other myelodysplastic syndromes: Secondary | ICD-10-CM

## 2024-02-21 DIAGNOSIS — D696 Thrombocytopenia, unspecified: Secondary | ICD-10-CM

## 2024-02-21 LAB — CBC WITH DIFFERENTIAL/PLATELET
Abs Immature Granulocytes: 1.81 K/uL — ABNORMAL HIGH (ref 0.00–0.07)
Basophils Absolute: 0.3 K/uL — ABNORMAL HIGH (ref 0.0–0.1)
Basophils Relative: 1 %
Eosinophils Absolute: 0.1 K/uL (ref 0.0–0.5)
Eosinophils Relative: 0 %
HCT: 26.3 % — ABNORMAL LOW (ref 39.0–52.0)
Hemoglobin: 8 g/dL — ABNORMAL LOW (ref 13.0–17.0)
Immature Granulocytes: 8 %
Lymphocytes Relative: 7 %
Lymphs Abs: 1.5 K/uL (ref 0.7–4.0)
MCH: 30.2 pg (ref 26.0–34.0)
MCHC: 30.4 g/dL (ref 30.0–36.0)
MCV: 99.2 fL (ref 80.0–100.0)
Monocytes Absolute: 0.7 K/uL (ref 0.1–1.0)
Monocytes Relative: 3 %
Neutro Abs: 17.9 K/uL — ABNORMAL HIGH (ref 1.7–7.7)
Neutrophils Relative %: 81 %
Platelets: 166 K/uL (ref 150–400)
RBC: 2.65 MIL/uL — ABNORMAL LOW (ref 4.22–5.81)
RDW: 27.6 % — ABNORMAL HIGH (ref 11.5–15.5)
WBC: 22.2 K/uL — ABNORMAL HIGH (ref 4.0–10.5)
nRBC: 1.7 % — ABNORMAL HIGH (ref 0.0–0.2)

## 2024-02-21 LAB — TSH: TSH: 3.17 u[IU]/mL (ref 0.350–4.500)

## 2024-02-21 MED ORDER — EPOETIN ALFA-EPBX 40000 UNIT/ML IJ SOLN
40000.0000 [IU] | Freq: Once | INTRAMUSCULAR | Status: AC
  Administered 2024-02-21: 40000 [IU] via SUBCUTANEOUS
  Filled 2024-02-21: qty 1

## 2024-02-24 ENCOUNTER — Other Ambulatory Visit: Payer: Self-pay

## 2024-02-24 ENCOUNTER — Encounter (HOSPITAL_COMMUNITY): Payer: Self-pay | Admitting: Hematology and Oncology

## 2024-02-24 ENCOUNTER — Emergency Department (HOSPITAL_COMMUNITY)

## 2024-02-24 ENCOUNTER — Encounter (HOSPITAL_COMMUNITY): Payer: Self-pay

## 2024-02-24 ENCOUNTER — Inpatient Hospital Stay (HOSPITAL_COMMUNITY)
Admission: EM | Admit: 2024-02-24 | Discharge: 2024-03-08 | DRG: 377 | Disposition: A | Discharge status: Home Health | Attending: Internal Medicine | Admitting: Internal Medicine

## 2024-02-24 DIAGNOSIS — S0990XA Unspecified injury of head, initial encounter: Secondary | ICD-10-CM | POA: Diagnosis not present

## 2024-02-24 DIAGNOSIS — Z96642 Presence of left artificial hip joint: Secondary | ICD-10-CM | POA: Diagnosis present

## 2024-02-24 DIAGNOSIS — N1831 Chronic kidney disease, stage 3a: Secondary | ICD-10-CM | POA: Diagnosis not present

## 2024-02-24 DIAGNOSIS — Z66 Do not resuscitate: Secondary | ICD-10-CM | POA: Diagnosis present

## 2024-02-24 DIAGNOSIS — Z96653 Presence of artificial knee joint, bilateral: Secondary | ICD-10-CM | POA: Diagnosis present

## 2024-02-24 DIAGNOSIS — Z87891 Personal history of nicotine dependence: Secondary | ICD-10-CM

## 2024-02-24 DIAGNOSIS — K2970 Gastritis, unspecified, without bleeding: Secondary | ICD-10-CM | POA: Insufficient documentation

## 2024-02-24 DIAGNOSIS — R630 Anorexia: Secondary | ICD-10-CM | POA: Diagnosis present

## 2024-02-24 DIAGNOSIS — Y92002 Bathroom of unspecified non-institutional (private) residence single-family (private) house as the place of occurrence of the external cause: Secondary | ICD-10-CM

## 2024-02-24 DIAGNOSIS — N281 Cyst of kidney, acquired: Secondary | ICD-10-CM | POA: Diagnosis not present

## 2024-02-24 DIAGNOSIS — Z7982 Long term (current) use of aspirin: Secondary | ICD-10-CM

## 2024-02-24 DIAGNOSIS — E8721 Acute metabolic acidosis: Secondary | ICD-10-CM

## 2024-02-24 DIAGNOSIS — M5030 Other cervical disc degeneration, unspecified cervical region: Secondary | ICD-10-CM | POA: Diagnosis not present

## 2024-02-24 DIAGNOSIS — N170 Acute kidney failure with tubular necrosis: Secondary | ICD-10-CM | POA: Diagnosis present

## 2024-02-24 DIAGNOSIS — K2971 Gastritis, unspecified, with bleeding: Principal | ICD-10-CM | POA: Diagnosis present

## 2024-02-24 DIAGNOSIS — E872 Acidosis, unspecified: Secondary | ICD-10-CM | POA: Diagnosis not present

## 2024-02-24 DIAGNOSIS — R531 Weakness: Secondary | ICD-10-CM | POA: Diagnosis not present

## 2024-02-24 DIAGNOSIS — R112 Nausea with vomiting, unspecified: Secondary | ICD-10-CM | POA: Diagnosis not present

## 2024-02-24 DIAGNOSIS — J189 Pneumonia, unspecified organism: Secondary | ICD-10-CM | POA: Diagnosis present

## 2024-02-24 DIAGNOSIS — M47812 Spondylosis without myelopathy or radiculopathy, cervical region: Secondary | ICD-10-CM | POA: Diagnosis not present

## 2024-02-24 DIAGNOSIS — Z79899 Other long term (current) drug therapy: Secondary | ICD-10-CM | POA: Diagnosis not present

## 2024-02-24 DIAGNOSIS — D62 Acute posthemorrhagic anemia: Secondary | ICD-10-CM | POA: Diagnosis present

## 2024-02-24 DIAGNOSIS — I5032 Chronic diastolic (congestive) heart failure: Secondary | ICD-10-CM | POA: Diagnosis present

## 2024-02-24 DIAGNOSIS — T502X5A Adverse effect of carbonic-anhydrase inhibitors, benzothiadiazides and other diuretics, initial encounter: Secondary | ICD-10-CM | POA: Diagnosis not present

## 2024-02-24 DIAGNOSIS — I2510 Atherosclerotic heart disease of native coronary artery without angina pectoris: Secondary | ICD-10-CM | POA: Diagnosis present

## 2024-02-24 DIAGNOSIS — D469 Myelodysplastic syndrome, unspecified: Secondary | ICD-10-CM

## 2024-02-24 DIAGNOSIS — Z7984 Long term (current) use of oral hypoglycemic drugs: Secondary | ICD-10-CM | POA: Diagnosis not present

## 2024-02-24 DIAGNOSIS — C9110 Chronic lymphocytic leukemia of B-cell type not having achieved remission: Secondary | ICD-10-CM | POA: Diagnosis present

## 2024-02-24 DIAGNOSIS — D72829 Elevated white blood cell count, unspecified: Secondary | ICD-10-CM | POA: Diagnosis not present

## 2024-02-24 DIAGNOSIS — K8020 Calculus of gallbladder without cholecystitis without obstruction: Secondary | ICD-10-CM | POA: Diagnosis not present

## 2024-02-24 DIAGNOSIS — M479 Spondylosis, unspecified: Secondary | ICD-10-CM | POA: Diagnosis present

## 2024-02-24 DIAGNOSIS — E8809 Other disorders of plasma-protein metabolism, not elsewhere classified: Secondary | ICD-10-CM | POA: Diagnosis present

## 2024-02-24 DIAGNOSIS — K3189 Other diseases of stomach and duodenum: Secondary | ICD-10-CM | POA: Diagnosis not present

## 2024-02-24 DIAGNOSIS — K922 Gastrointestinal hemorrhage, unspecified: Secondary | ICD-10-CM

## 2024-02-24 DIAGNOSIS — R1084 Generalized abdominal pain: Secondary | ICD-10-CM | POA: Diagnosis not present

## 2024-02-24 DIAGNOSIS — I129 Hypertensive chronic kidney disease with stage 1 through stage 4 chronic kidney disease, or unspecified chronic kidney disease: Secondary | ICD-10-CM | POA: Diagnosis not present

## 2024-02-24 DIAGNOSIS — R140 Abdominal distension (gaseous): Secondary | ICD-10-CM | POA: Diagnosis not present

## 2024-02-24 DIAGNOSIS — N19 Unspecified kidney failure: Secondary | ICD-10-CM | POA: Diagnosis not present

## 2024-02-24 DIAGNOSIS — R162 Hepatomegaly with splenomegaly, not elsewhere classified: Secondary | ICD-10-CM | POA: Diagnosis not present

## 2024-02-24 DIAGNOSIS — K5730 Diverticulosis of large intestine without perforation or abscess without bleeding: Secondary | ICD-10-CM | POA: Diagnosis not present

## 2024-02-24 DIAGNOSIS — R578 Other shock: Secondary | ICD-10-CM | POA: Diagnosis present

## 2024-02-24 DIAGNOSIS — S199XXA Unspecified injury of neck, initial encounter: Secondary | ICD-10-CM | POA: Diagnosis not present

## 2024-02-24 DIAGNOSIS — E669 Obesity, unspecified: Secondary | ICD-10-CM | POA: Diagnosis present

## 2024-02-24 DIAGNOSIS — I130 Hypertensive heart and chronic kidney disease with heart failure and stage 1 through stage 4 chronic kidney disease, or unspecified chronic kidney disease: Secondary | ICD-10-CM | POA: Diagnosis present

## 2024-02-24 DIAGNOSIS — Z221 Carrier of other intestinal infectious diseases: Secondary | ICD-10-CM

## 2024-02-24 DIAGNOSIS — D649 Anemia, unspecified: Principal | ICD-10-CM

## 2024-02-24 DIAGNOSIS — E876 Hypokalemia: Secondary | ICD-10-CM | POA: Diagnosis not present

## 2024-02-24 DIAGNOSIS — R7401 Elevation of levels of liver transaminase levels: Secondary | ICD-10-CM | POA: Diagnosis present

## 2024-02-24 DIAGNOSIS — R188 Other ascites: Secondary | ICD-10-CM | POA: Diagnosis present

## 2024-02-24 DIAGNOSIS — R933 Abnormal findings on diagnostic imaging of other parts of digestive tract: Secondary | ICD-10-CM | POA: Diagnosis not present

## 2024-02-24 DIAGNOSIS — Z6823 Body mass index (BMI) 23.0-23.9, adult: Secondary | ICD-10-CM

## 2024-02-24 DIAGNOSIS — R748 Abnormal levels of other serum enzymes: Secondary | ICD-10-CM | POA: Diagnosis present

## 2024-02-24 DIAGNOSIS — I110 Hypertensive heart disease with heart failure: Secondary | ICD-10-CM | POA: Diagnosis not present

## 2024-02-24 DIAGNOSIS — N400 Enlarged prostate without lower urinary tract symptoms: Secondary | ICD-10-CM | POA: Diagnosis present

## 2024-02-24 DIAGNOSIS — I2723 Pulmonary hypertension due to lung diseases and hypoxia: Secondary | ICD-10-CM | POA: Diagnosis present

## 2024-02-24 DIAGNOSIS — K921 Melena: Secondary | ICD-10-CM | POA: Diagnosis not present

## 2024-02-24 DIAGNOSIS — M170 Bilateral primary osteoarthritis of knee: Secondary | ICD-10-CM | POA: Diagnosis present

## 2024-02-24 DIAGNOSIS — W010XXA Fall on same level from slipping, tripping and stumbling without subsequent striking against object, initial encounter: Secondary | ICD-10-CM | POA: Diagnosis present

## 2024-02-24 DIAGNOSIS — M47813 Spondylosis without myelopathy or radiculopathy, cervicothoracic region: Secondary | ICD-10-CM | POA: Diagnosis not present

## 2024-02-24 DIAGNOSIS — K219 Gastro-esophageal reflux disease without esophagitis: Secondary | ICD-10-CM | POA: Diagnosis not present

## 2024-02-24 DIAGNOSIS — N179 Acute kidney failure, unspecified: Secondary | ICD-10-CM | POA: Diagnosis not present

## 2024-02-24 LAB — COMPREHENSIVE METABOLIC PANEL WITH GFR
ALT: 13 U/L (ref 0–44)
AST: 28 U/L (ref 15–41)
Albumin: 3.4 g/dL — ABNORMAL LOW (ref 3.5–5.0)
Alkaline Phosphatase: 252 U/L — ABNORMAL HIGH (ref 38–126)
Anion gap: 17 — ABNORMAL HIGH (ref 5–15)
BUN: 21 mg/dL (ref 8–23)
CO2: 16 mmol/L — ABNORMAL LOW (ref 22–32)
Calcium: 8.7 mg/dL — ABNORMAL LOW (ref 8.9–10.3)
Chloride: 102 mmol/L (ref 98–111)
Creatinine, Ser: 1.72 mg/dL — ABNORMAL HIGH (ref 0.61–1.24)
GFR, Estimated: 40 mL/min — ABNORMAL LOW (ref >60)
Glucose, Bld: 148 mg/dL — ABNORMAL HIGH (ref 70–99)
Potassium: 4.3 mmol/L (ref 3.5–5.1)
Sodium: 134 mmol/L — ABNORMAL LOW (ref 135–145)
Total Bilirubin: 1.1 mg/dL (ref 0.0–1.2)
Total Protein: 5.4 g/dL — ABNORMAL LOW (ref 6.5–8.1)

## 2024-02-24 LAB — CBC WITH DIFFERENTIAL/PLATELET
Abs Immature Granulocytes: 3.04 K/uL — ABNORMAL HIGH (ref 0.00–0.07)
Basophils Absolute: 0.3 K/uL — ABNORMAL HIGH (ref 0.0–0.1)
Basophils Relative: 1 %
Eosinophils Absolute: 0 K/uL (ref 0.0–0.5)
Eosinophils Relative: 0 %
HCT: 19.3 % — ABNORMAL LOW (ref 39.0–52.0)
Hemoglobin: 5.6 g/dL — CL (ref 13.0–17.0)
Immature Granulocytes: 11 %
Lymphocytes Relative: 9 %
Lymphs Abs: 2.6 K/uL (ref 0.7–4.0)
MCH: 30.9 pg (ref 26.0–34.0)
MCHC: 29 g/dL — ABNORMAL LOW (ref 30.0–36.0)
MCV: 106.6 fL — ABNORMAL HIGH (ref 80.0–100.0)
Monocytes Absolute: 0.9 K/uL (ref 0.1–1.0)
Monocytes Relative: 3 %
Neutro Abs: 22.1 K/uL — ABNORMAL HIGH (ref 1.7–7.7)
Neutrophils Relative %: 76 %
Platelets: 193 K/uL (ref 150–400)
RBC: 1.81 MIL/uL — ABNORMAL LOW (ref 4.22–5.81)
RDW: 28.2 % — ABNORMAL HIGH (ref 11.5–15.5)
WBC: 29 K/uL — ABNORMAL HIGH (ref 4.0–10.5)
nRBC: 2.5 % — ABNORMAL HIGH (ref 0.0–0.2)

## 2024-02-24 LAB — I-STAT CG4 LACTIC ACID, ED: Lactic Acid, Venous: 5.7 mmol/L (ref 0.5–1.9)

## 2024-02-24 LAB — PROTIME-INR
INR: 1.4 — ABNORMAL HIGH (ref 0.8–1.2)
Prothrombin Time: 18.1 s — ABNORMAL HIGH (ref 11.4–15.2)

## 2024-02-24 LAB — MRSA NEXT GEN BY PCR, NASAL: MRSA by PCR Next Gen: NOT DETECTED

## 2024-02-24 LAB — POC OCCULT BLOOD, ED: Fecal Occult Bld: POSITIVE — AB

## 2024-02-24 LAB — PREPARE RBC (CROSSMATCH)

## 2024-02-24 MED ORDER — SODIUM BICARBONATE 8.4 % IV SOLN
50.0000 meq | Freq: Once | INTRAVENOUS | Status: AC
  Administered 2024-02-24: 50 meq via INTRAVENOUS
  Filled 2024-02-24: qty 50

## 2024-02-24 MED ORDER — PIPERACILLIN-TAZOBACTAM 3.375 G IVPB 30 MIN
3.3750 g | Freq: Once | INTRAVENOUS | Status: AC
  Administered 2024-02-24: 3.375 g via INTRAVENOUS
  Filled 2024-02-24: qty 50

## 2024-02-24 MED ORDER — SODIUM CHLORIDE 0.9 % IV BOLUS
1000.0000 mL | Freq: Once | INTRAVENOUS | Status: AC
  Administered 2024-02-24: 1000 mL via INTRAVENOUS

## 2024-02-24 MED ORDER — SODIUM CHLORIDE 0.9 % IV SOLN
250.0000 mL | INTRAVENOUS | Status: AC
Start: 2024-02-24 — End: 2024-02-25

## 2024-02-24 MED ORDER — ORAL CARE MOUTH RINSE: 15.0000 mL | OROMUCOSAL | Status: DC | PRN

## 2024-02-24 MED ORDER — PANTOPRAZOLE SODIUM 40 MG IV SOLR
40.0000 mg | Freq: Two times a day (BID) | INTRAVENOUS | Status: DC
  Administered 2024-02-25 – 2024-02-26 (×3): 40 mg via INTRAVENOUS
  Filled 2024-02-24 (×3): qty 10

## 2024-02-24 MED ORDER — VITAMIN K1 10 MG/ML IJ SOLN
5.0000 mg | Freq: Once | INTRAVENOUS | Status: AC
  Administered 2024-02-24: 5 mg via INTRAVENOUS
  Filled 2024-02-24: qty 0.5

## 2024-02-24 MED ORDER — PANTOPRAZOLE SODIUM 40 MG IV SOLR
40.0000 mg | Freq: Once | INTRAVENOUS | Status: AC
  Administered 2024-02-24: 40 mg via INTRAVENOUS
  Filled 2024-02-24: qty 10

## 2024-02-24 MED ORDER — NOREPINEPHRINE 4 MG/250ML-% IV SOLN: 0.0000 ug/min | INTRAVENOUS | Status: DC

## 2024-02-24 MED ORDER — LACTATED RINGERS IV SOLN: INTRAVENOUS | Status: AC

## 2024-02-24 MED ORDER — PIPERACILLIN-TAZOBACTAM 3.375 G IVPB
3.3750 g | Freq: Three times a day (TID) | INTRAVENOUS | Status: AC
  Administered 2024-02-24 – 2024-03-03 (×25): 3.375 g via INTRAVENOUS
  Filled 2024-02-24 (×25): qty 50

## 2024-02-24 MED ORDER — IOHEXOL 300 MG/ML  SOLN
100.0000 mL | Freq: Once | INTRAMUSCULAR | Status: AC | PRN
Start: 2024-02-24 — End: 2024-02-24
  Administered 2024-02-24: 100 mL via INTRAVENOUS

## 2024-02-24 MED ORDER — POLYETHYLENE GLYCOL 3350 17 G PO PACK: 17.0000 g | PACK | Freq: Every day | ORAL | Status: DC | PRN

## 2024-02-24 MED ORDER — SODIUM CHLORIDE 0.9% IV SOLUTION
Freq: Once | INTRAVENOUS | Status: AC
Start: 2024-02-24 — End: ?

## 2024-02-24 MED ORDER — SODIUM CHLORIDE 0.9 % IV SOLN
50.0000 ug/h | INTRAVENOUS | Status: DC
  Administered 2024-02-24 – 2024-02-28 (×8): 50 ug/h via INTRAVENOUS
  Filled 2024-02-24 (×12): qty 1

## 2024-02-24 MED ORDER — CALCIUM GLUCONATE-NACL 2-0.675 GM/100ML-% IV SOLN
2.0000 g | Freq: Once | INTRAVENOUS | Status: AC
  Administered 2024-02-24: 2000 mg via INTRAVENOUS
  Filled 2024-02-24: qty 100

## 2024-02-24 MED ORDER — DOCUSATE SODIUM 100 MG PO CAPS: 100.0000 mg | ORAL_CAPSULE | Freq: Two times a day (BID) | ORAL | Status: DC | PRN

## 2024-02-24 MED ORDER — CHLORHEXIDINE GLUCONATE CLOTH 2 % EX PADS
6.0000 | MEDICATED_PAD | Freq: Every day | CUTANEOUS | Status: DC
  Administered 2024-02-24 – 2024-02-28 (×5): 6 via TOPICAL

## 2024-02-24 NOTE — ED Notes (Signed)
 Pt not currently in room. 2nd time checking room

## 2024-02-24 NOTE — ED Notes (Signed)
 Pt provided with urinal and told we need urine

## 2024-02-24 NOTE — H&P (Cosign Needed Addendum)
 NAME:  Jared [name], MRN:  [id number], DOB:  1945-06-03, LOS: 0 ADMISSION DATE:  02/24/2024, CONSULTATION DATE:  02/24/24 REFERRING MD:  Cottie, CHIEF COMPLAINT:  weakness   History of Present Illness:  78 yo M PMH CLL MDS HTN CKD 3a diastolic HF who presented to ED 02/24/24 w CC weakness/lethargy. Hypotensive prehospital and given small IVF bolus. In ED anemic hgb 5.6, + fecal occult, LA 5.6 and SBP low 90s. CT a/p with contrast shows a possible gastric wall / perigastric hematoma or other complex fluid collection. Also BLL (L>R) ASD,ascites, splenomegaly and hepatomegaly.   Getting 2 PRBC  GI has been called by EM  PCCM to admit    In my d/w pt, in addition to weakness/malaise above he endorses unintentional wt loss, loss of appetite and abd pain a few weeks. Endorsed emesis today. No hematemesis, no coffee ground emesis. Endorses darker stools than usual -- thought maybe related to iron but isnt sure. Wife suspects melena.   Pertinent  Medical History  CLL MDS HTN CKD3a dHF  Significant Hospital Events: Including procedures, antibiotic start and stop dates in addition to other pertinent events    10/13 admit to ICU w GIB  Interim History / Subjective:  Getting transfused with some improvement in BP, but bulk of recent SBP still 90s   Objective    Blood pressure (!) 93/50, pulse 93, temperature 97.8 F (36.6 C), temperature source Oral, resp. rate 20, height 5' 11 (1.803 m), weight 77.2 kg, SpO2 94%.        Intake/Output Summary (Last 24 hours) at 02/24/2024 1703 Last data filed at 02/24/2024 1651 Gross per 24 hour  Intake 2048.29 ml  Output --  Net 2048.29 ml   Filed Weights   02/24/24 1419  Weight: 77.2 kg    Examination: General: chronically and acutely ill M NAD  HENT: NCAT pale tacky mm  Lungs: even unlabored on RA  Cardiovascular: rr  Abdomen: round soft generalized ttp, normactive bowel sounds Extremities: no obvious acute joint deformity but limited  by attire  Neuro: lethargic, oriented x3 GU: defer  Resolved problem list   Assessment and Plan   ABLA / hemorrhagic shock Lactic acidosis 2/2 above GIB, concern for gastric wall vs perigastric hematoma  Possible hemoperitoneum, ascites Hepatomegaly, splenomegaly  Elevated LFTs CKD 3a  AGMA  CLL MDS Hx HTN CAP  DNR P -admit to ICU  -serial H/H -CBC DIC this evening  -GI was engaged by EM -- pending consult -I reached out to IR -- gastric wall hematoma on CT a/p with contrast, no obvious bleed source. In IR review of imaging also some concern for hemoperitoneum -Plan will be to got for CTA GIB protocol if his hgb is dropping or if he becomes unstable as he does have CKD & already had a contrast load today unless clinically needed -will also page out to [facility] re possible hemoperitoneum though doubt there will be intervention for this acutely ** addendum, spoke w [facility], agree w plan for CTA GI if h/h dropping or unstable, surgery would really only be last resort  -PPI, octreotide (maybe reduce splanchnic flow)  -IVF, abx  -NPO -hold home meds w current soft pressures  -talked about code status w pt, wife. DNR - pre arrest interventions desired     Labs   CBC: Recent Labs  Lab 02/21/24 0944 02/24/24 1446  WBC 22.2* 29.0*  NEUTROABS 17.9* PENDING  HGB 8.0* 5.6*  HCT 26.3* 19.3*  MCV 99.2  106.6*  PLT 166 193    Basic Metabolic Panel: Recent Labs  Lab 02/24/24 1446  NA 134*  K 4.3  CL 102  CO2 16*  GLUCOSE 148*  BUN 21  CREATININE 1.72*  CALCIUM 8.7*   GFR: Estimated Creatinine Clearance: 36.5 mL/min (A) (by C-G formula based on SCr of 1.72 mg/dL (H)). Recent Labs  Lab 02/21/24 0944 02/24/24 1446 02/24/24 1449  WBC 22.2* 29.0*  --   LATICACIDVEN  --   --  5.7*    Liver Function Tests: Recent Labs  Lab 02/24/24 1446  AST 28  ALT 13  ALKPHOS 252*  BILITOT 1.1  PROT 5.4*  ALBUMIN 3.4*   No results for input(s): LIPASE, AMYLASE in the last  168 hours. No results for input(s): AMMONIA in the last 168 hours.  ABG    Component Value Date/Time   PHART 7.378 04/23/2023 0955   PCO2ART 35.5 04/23/2023 0955   PO2ART 78 (L) 04/23/2023 0955   HCO3 23.1 04/23/2023 0955   HCO3 20.9 04/23/2023 0955   TCO2 24 04/23/2023 0955   TCO2 22 04/23/2023 0955   ACIDBASEDEF 2.0 04/23/2023 0955   ACIDBASEDEF 4.0 (H) 04/23/2023 0955   O2SAT 68 04/23/2023 0955   O2SAT 95 04/23/2023 0955     Coagulation Profile: Recent Labs  Lab 02/24/24 1446  INR 1.4*    Cardiac Enzymes: No results for input(s): CKTOTAL, CKMB, CKMBINDEX, TROPONINI in the last 168 hours.  HbA1C: No results found for: HGBA1C  CBG: No results for input(s): GLUCAP in the last 168 hours.  Review of Systems:   Review of Systems  Constitutional:  Positive for malaise/fatigue and weight loss.  HENT: Negative.    Respiratory: Negative.    Cardiovascular: Negative.   Gastrointestinal:  Positive for abdominal pain, melena and vomiting.  Musculoskeletal:  Positive for falls.  Skin: Negative.   Neurological:  Positive for weakness.     Past Medical History:  He,  has a past medical history of Arthritis, Enlarged prostate, Headache, and Hypertension.   Surgical History:   Past Surgical History:  Procedure Laterality Date   BACK SURGERY  2002   CORONARY PRESSURE/FFR STUDY N/A 04/23/2023   Procedure: CORONARY PRESSURE/FFR STUDY;  Surgeon: Elmira Newman PARAS, MD;  [facility];  Service: Cardiovascular;  Laterality: N/A;   JOINT REPLACEMENT     KNEE ARTHROSCOPY Left 05/2004   thelbert 09/27/2010   RIGHT/LEFT HEART CATH AND CORONARY ANGIOGRAPHY N/A 04/23/2023   Procedure: RIGHT/LEFT HEART CATH AND CORONARY ANGIOGRAPHY;  Surgeon: Elmira Newman PARAS, MD;  [facility];  Service: Cardiovascular;  Laterality: N/A;   SHOULDER ARTHROSCOPY WITH OPEN ROTATOR CUFF REPAIR Right 2009   /notes 09/13/2010   TOTAL HIP ARTHROPLASTY  Left 10/02/2017   TOTAL HIP ARTHROPLASTY Left 10/02/2017   Procedure: LEFT TOTAL HIP ARTHROPLASTY ANTERIOR APPROACH;  Surgeon: Liam Lerner, MD;  [facility];  Service: Orthopedics;  Laterality: Left;   TOTAL KNEE ARTHROPLASTY  04/16/2011   Procedure: TOTAL KNEE ARTHROPLASTY;  Surgeon: Lerner PARAS Liam;  [facility]: [facility];  Service: Orthopedics;  Laterality: Left;  LEFT TOTAL KNEE ARTHROPLASTY    TOTAL KNEE ARTHROPLASTY Right 04/19/2014   Procedure: RIGHT TOTAL KNEE ARTHROPLASTY;  Surgeon: Lerner PARAS Liam, MD;  Location: [facility];  Service: Orthopedics;  Laterality: Right;     Social History:   reports that he quit smoking about 49 years ago. His smoking use included cigarettes. He started smoking about 59 years ago. He has  a 10 pack-year smoking history. He has never used smokeless tobacco. He reports that he does not drink alcohol and does not use drugs.   Family History:  His family history includes Cancer in his sister and sister; Migraines in his mother.   Allergies No Known Allergies   Home Medications  Prior to Admission medications   Medication Sig Start Date End Date Taking? Authorizing Provider  acetaminophen  (TYLENOL ) 650 MG CR tablet 650 mg as needed for pain.    [provider]  amLODipine  (NORVASC ) 10 MG tablet Take 10 mg by mouth daily.    [provider]  aspirin  EC (ASPIRIN  LOW DOSE) 81 MG tablet TAKE 1 TABLET (81 MG TOTAL) BY MOUTH DAILY. SWALLOW WHOLE. 09/05/23   Patwardhan, Newman PARAS, MD  atorvastatin (LIPITOR) 10 MG tablet Take 10 mg by mouth daily. 12/22/21   [provider]  Azelastine HCl 137 MCG/SPRAY SOLN Place into both nostrils as needed (allergies). 05/15/23   [provider]  brimonidine  (ALPHAGAN ) 0.2 % ophthalmic solution Place 1 drop into both eyes 2 (two) times daily.     [provider]  Brimonidine -Dorzolamide  0.15-2 % SOLN Place 1 drop into the right eye daily.    [provider]  empagliflozin (JARDIANCE) 10 MG  TABS tablet Take 10 mg by mouth daily.    [provider]  furosemide  (LASIX ) 20 MG tablet Take 1 tablet (20 mg total) by mouth daily. 04/23/23 04/22/24  Patwardhan, Newman PARAS, MD  isosorbide  mononitrate (IMDUR ) 30 MG 24 hr tablet Take 0.5 tablets (15 mg total) by mouth daily. You can take an additional 15 mg as needed for chest pain. 05/17/23 02/14/24  Wyn Jackee VEAR Mickey., NP  latanoprost  (XALATAN ) 0.005 % ophthalmic solution Place 1 drop into both eyes at bedtime.    [provider]  lisinopril  (ZESTRIL ) 20 MG tablet Take 1 tablet (20 mg total) by mouth at bedtime. 05/29/23   Wyn Jackee VEAR Mickey., NP  Multiple Vitamin (MULTI VITAMIN) TABS daily at 6 (six) AM.    [provider]  Multiple Vitamins-Minerals (MULTIVITAMINS THER. W/MINERALS) TABS Take 1 tablet by mouth daily.      [provider]  nitroGLYCERIN  (NITROSTAT ) 0.4 MG SL tablet Place 1 tablet (0.4 mg total) under the tongue every 5 (five) minutes as needed for chest pain. 04/17/23   Patwardhan, Newman PARAS, MD  prednisoLONE acetate (PRED FORTE) 1 % ophthalmic suspension Place 1 drop into the right eye 4 (four) times daily. 05/03/23   [provider]  sildenafil (VIAGRA) 100 MG tablet as needed for erectile dysfunction.    [provider]  tamsulosin  (FLOMAX ) 0.4 MG CAPS capsule Take 0.4 mg by mouth daily after supper.     [provider]     Critical care time: 60 min       CRITICAL CARE Performed by: Ronnald FORBES Gave   Total critical care time: 60 minutes  Critical care time was exclusive of separately billable procedures and treating other patients. Critical care was necessary to treat or prevent imminent or life-threatening deterioration.  Critical care was time spent personally by me on the following activities: development of treatment plan with patient and/or surrogate as well as nursing, discussions with consultants, evaluation of patient's response to treatment, examination  of patient, obtaining history from patient or surrogate, ordering and performing treatments and interventions, ordering and review of laboratory studies, ordering and review of radiographic studies, pulse oximetry and re-evaluation of patient's condition.  Ronnald Gave MSN, AGACNP-BC [facility] Amion for pager  02/24/2024, 6:11 PM

## 2024-02-24 NOTE — ED Provider Notes (Signed)
 78 yo male here with weakness, abdominal pain, fall Initial lactate 5.7, anemia 5.6 (baseline 7-8), hemoccult negative Has received 2 liters IV fluids WBC 29.0 but chronically elevated   Patient is DNR/DNI  Physical Exam  BP (!) 93/50   Pulse 93   Temp 97.8 F (36.6 C) (Oral)   Resp 20   Ht 5' 11 (1.803 m)   Wt 77.2 kg   SpO2 94%   BMI 23.75 kg/m   Physical Exam  Procedures  Procedures  ED Course / MDM   Clinical Course as of 02/24/24 2012  Mon Feb 24, 2024  1649 Hx of MDS, chronic anemia [MT]  1702 [facility] critical care to see patient [MT]  1743 Admitted to ICU [MT]    Clinical Course User Index [MT] Cottie Donnice PARAS, MD   Medical Decision Making Amount and/or Complexity of Data Reviewed Labs: ordered. Radiology: ordered.  Risk Prescription drug management. Decision regarding hospitalization.   Dr Lawernce Ee GI consulted regarding anemia, upon admission to ICU       Cottie Donnice PARAS, MD 02/24/24 2013

## 2024-02-24 NOTE — ED Provider Notes (Signed)
 [place] EMERGENCY DEPARTMENT AT [facility] Provider Note   CSN: [id number] Arrival date & time: 02/24/24  1410     Patient presents with: Weakness   Jared [name] is a 78 y.o. male.    Weakness   Presents because of weakness.  Patient states that he fell today while in the bathroom.  Also had episode of emesis.  No hematemesis.  Endorses generalized abdominal pain that started this morning as well.  Last bowel movement was this morning.  No hematochezia.  No melena.  No fever no chills but does endorse generalized weakness.  Denies any headache, neck pain, thoracic or lumbar pain.  EMS gave 250 cc of fluid in  route to the hospital.  Systolic was slightly low into the 90s.  Previous medical history reviewed : Patient was seen by hematology oncology on February 14, 2024.  History of macrocytic anemia and thrombocytopenia.  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  Recommend supportive care     Prior to Admission medications   Medication Sig Start Date End Date Taking? Authorizing Provider  acetaminophen  (TYLENOL ) 650 MG CR tablet 650 mg as needed for pain.    [provider]  amLODipine  (NORVASC ) 10 MG tablet Take 10 mg by mouth daily.    [provider]  aspirin  EC (ASPIRIN  LOW DOSE) 81 MG tablet TAKE 1 TABLET (81 MG TOTAL) BY MOUTH DAILY. SWALLOW WHOLE. 09/05/23   Patwardhan, Newman PARAS, MD  atorvastatin (LIPITOR) 10 MG tablet Take 10 mg by mouth daily. 12/22/21   [provider]  Azelastine HCl 137 MCG/SPRAY SOLN Place into both nostrils as needed (allergies). 05/15/23   [provider]  brimonidine  (ALPHAGAN ) 0.2 % ophthalmic solution Place 1 drop into both eyes 2 (two) times daily.     [provider]  Brimonidine -Dorzolamide  0.15-2 % SOLN Place 1 drop into the right eye daily.    [provider]  empagliflozin (JARDIANCE) 10 MG TABS tablet Take 10 mg by mouth  daily.    [provider]  furosemide  (LASIX ) 20 MG tablet Take 1 tablet (20 mg total) by mouth daily. 04/23/23 04/22/24  Patwardhan, Newman PARAS, MD  isosorbide  mononitrate (IMDUR ) 30 MG 24 hr tablet Take 0.5 tablets (15 mg total) by mouth daily. You can take an additional 15 mg as needed for chest pain. 05/17/23 02/14/24  Wyn Jackee VEAR Mickey., NP  latanoprost  (XALATAN ) 0.005 % ophthalmic solution Place 1 drop into both eyes at bedtime.    [provider]  lisinopril  (ZESTRIL ) 20 MG tablet Take 1 tablet (20 mg total) by mouth at bedtime. 05/29/23   Wyn Jackee VEAR Mickey., NP  Multiple Vitamin (MULTI VITAMIN) TABS daily at 6 (six) AM.    [provider]  Multiple Vitamins-Minerals (MULTIVITAMINS THER. W/MINERALS) TABS Take 1 tablet by mouth daily.      [provider]  nitroGLYCERIN  (NITROSTAT ) 0.4 MG SL tablet Place 1 tablet (0.4 mg total) under the tongue every 5 (five) minutes as needed for chest pain. 04/17/23   Patwardhan, Newman PARAS, MD  prednisoLONE acetate (PRED FORTE) 1 % ophthalmic suspension Place 1 drop into the right eye 4 (four) times daily. 05/03/23   [provider]  sildenafil (VIAGRA) 100 MG tablet as needed for erectile dysfunction.    [provider]  tamsulosin  (FLOMAX ) 0.4 MG CAPS capsule Take 0.4 mg by mouth daily after supper.     [provider]  Allergies: Patient has no known allergies.    Review of Systems  Neurological:  Positive for weakness.    Updated Vital Signs BP (!) 93/50   Pulse 93   Temp (!) 97.4 F (36.3 C) (Oral)   Resp 20   Ht 5' 11 (1.803 m)   Wt 77.2 kg   SpO2 94%   BMI 23.75 kg/m   Physical Exam Vitals and nursing note reviewed.  Constitutional:      General: He is not in acute distress.    Appearance: He is well-developed.  HENT:     Head: Normocephalic and atraumatic.  Eyes:     Conjunctiva/sclera: Conjunctivae normal.  Cardiovascular:     Rate and Rhythm: Normal rate and regular  rhythm.     Heart sounds: No murmur heard. Pulmonary:     Effort: Pulmonary effort is normal. No respiratory distress.     Breath sounds: Normal breath sounds.  Abdominal:     Palpations: Abdomen is soft.     Tenderness: There is no abdominal tenderness.   Musculoskeletal:        General: No swelling.     Cervical back: Neck supple.  Skin:    General: Skin is warm and dry.     Capillary Refill: Capillary refill takes less than 2 seconds.  Neurological:     Mental Status: He is alert.  Psychiatric:        Mood and Affect: Mood normal.     (all labs ordered are listed, but only abnormal results are displayed) Labs Reviewed  COMPREHENSIVE METABOLIC PANEL WITH GFR - Abnormal; Notable for the following components:      Result Value   Sodium 134 (*)    CO2 16 (*)    Glucose, Bld 148 (*)    Creatinine, Ser 1.72 (*)    Calcium 8.7 (*)    Total Protein 5.4 (*)    Albumin 3.4 (*)    Alkaline Phosphatase 252 (*)    GFR, Estimated 40 (*)    Anion gap 17 (*)    All other components within normal limits  CBC WITH DIFFERENTIAL/PLATELET - Abnormal; Notable for the following components:   WBC 29.0 (*)    RBC 1.81 (*)    Hemoglobin 5.6 (*)    HCT 19.3 (*)    MCV 106.6 (*)    MCHC 29.0 (*)    RDW 28.2 (*)    nRBC 2.5 (*)    All other components within normal limits  PROTIME-INR - Abnormal; Notable for the following components:   Prothrombin Time 18.1 (*)    INR 1.4 (*)    All other components within normal limits  I-STAT CG4 LACTIC ACID, ED - Abnormal; Notable for the following components:   Lactic Acid, Venous 5.7 (*)    All other components within normal limits  POC OCCULT BLOOD, ED - Abnormal; Notable for the following components:   Fecal Occult Bld POSITIVE (*)    All other components within normal limits  CULTURE, BLOOD (ROUTINE X 2)  CULTURE, BLOOD (ROUTINE X 2)  URINALYSIS, W/ REFLEX TO CULTURE (INFECTION SUSPECTED)  I-STAT CG4 LACTIC ACID, ED  TYPE AND SCREEN   PREPARE RBC (CROSSMATCH)    EKG: None  Radiology: CT Cervical Spine Wo Contrast Result Date: 02/24/2024 CLINICAL DATA:  Fall, blunt trauma EXAM: CT CERVICAL SPINE WITHOUT CONTRAST TECHNIQUE: Multidetector CT imaging of the cervical spine was performed without intravenous contrast. Multiplanar CT image reconstructions were also generated. RADIATION DOSE REDUCTION:  This exam was performed according to the departmental dose-optimization program which includes automated exposure control, adjustment of the mA and/or kV according to patient size and/or use of iterative reconstruction technique. COMPARISON:  None Available. FINDINGS: Alignment: Mild reversal the normal cervical lordosis. 3 mm of grade 1 degenerative anterolisthesis at C7-T1. Skull base and vertebrae: Anterior spurring at the C1-2 articulation. Bilateral degenerative sternoclavicular arthropathy. No cervical spine fracture or acute bony findings identified. Soft tissues and spinal canal: Right common carotid atheromatous vascular calcification. Disc levels: C2-3: Prominent left foraminal stenosis due to uncinate and facet spurring. C3-4: Prominent bilateral foraminal stenosis and moderate central stenosis due to calcified central disc protrusion, uncinate spurring, and short pedicles. C4-5: Prominent bilateral foraminal stenosis and moderate central stenosis due to disc bulge, uncinate spurring, and short pedicles. Possible shallow left paracentral disc protrusion. C5-6: Moderate to prominent bilateral foraminal stenosis due to uncinate and facet spurring and short pedicles. C6-7: Moderate to prominent left and moderate right foraminal stenosis with borderline central stenosis due to disc osteophyte complex and uncinate spurring. C7-T1: Moderate bilateral foraminal stenosis due to disc uncovering and facet arthropathy. Upper chest: Mild biapical pleuroparenchymal scarring. Other: No supplemental non-categorized findings. IMPRESSION: 1. No acute  cervical spine findings. 2. Substantial multilevel impingement due to cervical spondylosis, degenerative disc disease, and short pedicles. 3. Bilateral degenerative sternoclavicular arthropathy. 4. Right common carotid atheromatous vascular calcification. Electronically Signed   By: Ryan Salvage M.D.   On: 02/24/2024 16:43     Procedures   Medications Ordered in the ED  0.9 %  sodium chloride  infusion (Manually program via Guardrails IV Fluids) (0 mLs Intravenous Hold 02/24/24 1609)  pantoprazole  (PROTONIX ) injection 40 mg (has no administration in time range)  sodium chloride  0.9 % bolus 1,000 mL (0 mLs Intravenous Stopped 02/24/24 1651)  piperacillin-tazobactam (ZOSYN) IVPB 3.375 g (0 g Intravenous Stopped 02/24/24 1533)  sodium chloride  0.9 % bolus 1,000 mL (0 mLs Intravenous Stopped 02/24/24 1651)  iohexol  (OMNIPAQUE ) 300 MG/ML solution 100 mL (100 mLs Intravenous Contrast Given 02/24/24 1601)    Clinical Course as of 02/24/24 1701  Mon Feb 24, 2024  1649 Hx of MDS, chronic anemia [MT]    Clinical Course User Index [MT] Trifan, Donnice PARAS, MD                                 Medical Decision Making Amount and/or Complexity of Data Reviewed Labs: ordered. Radiology: ordered.  Risk Prescription drug management.     HPI:    Presents because of weakness.  Patient states that he fell today while in the bathroom.  Also had episode of emesis.  No hematemesis.  Endorses generalized abdominal pain that started this morning as well.  Last bowel movement was this morning.  No hematochezia.  No melena.  No fever no chills but does endorse generalized weakness.  Denies any headache, neck pain, thoracic or lumbar pain.  EMS gave 250 cc of fluid in  route to the hospital.  Systolic was slightly low into the 90s.  Previous medical history reviewed : Patient was seen by hematology oncology on February 14, 2024.  History of macrocytic anemia and thrombocytopenia.  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  Recommend supportive care MDM:    Upon exam, patient afebrile.  No tachycardia.  Slightly low blood pressure with systolic of 93.  Obtaining well baseline.  Patient has tenderness to the abdomen with some guarding throughout the abdomen.   Initial concerns for possible sepsis.  Also concern for possible pathology such as ileus obstruction.  Started patient on fluid here in the ED.  Obtaining blood cultures as well as lactic acid.  Will plan was to obtain CT scan of the abdomen pelvis to rule out ileus, obstruction, other intra-abdominal process.  No chest pain or shortness of breath.  Feel like less like to be pneumonia or other cardiopulmonary process.    Stool guaiac exam was positive.  Positive for melena on exam as well.  Patient's laboratory workup.  Anemia.  Hemoglobin 5.6.  Patient baseline around 7-8.  Ordered emergent blood from the lab given patient's hypotension.  Patient ordered 2 units of packed red blood cells for administration.  Consented the patient to this.  Once again, stool guaiac exam was positive.  Start patient on PPI.   Patient also has leukocytosis.  Also has leukocytosis but 29,000 slightly higher than baseline.  Also started patient on Zosyn in the setting of possible intra-abdominal pathology that could be driving patient's leukocytosis and hypotension.  Lactic acid elevated.  Unclear whether or not this could be due to anemia driving patient's elevation lactic acid versus from  sepsis source.  Given fall.  Obtaining CT head as well as CT cervical spine.  Pending CT abdomen pelvis with contrast.   Patient signed out in stable condition.   I did speak to patient as well as family regarding CODE STATUS.  Currently, patient does not want a, CPR or breathing tube.  Understand that this would mean he would be passing away.  Okay with IV antibiotics at this time.  Interventions: 2 u PRBC, 2 L  NS, Pantoprazole , Zosyn  EKG Interpreted by Me: NSR   Cardiac Tele Interpreted by Me: Sinus   I have independently interpreted the CXR  and CT  images and agree with the radiologist finding    CRITICAL CARE Performed by: Lavonia LOISE Pat   Total critical care time: 45 minutes  Critical care time was exclusive of separately billable procedures and treating other patients.  Critical care was necessary to treat or prevent imminent or life-threatening deterioration.  Critical care was time spent personally by me on the following activities: development of treatment plan with patient and/or surrogate as well as nursing, discussions with consultants, evaluation of patient's response to treatment, examination of patient, obtaining history from patient or surrogate, ordering and performing treatments and interventions, ordering and review of laboratory studies, ordering and review of radiographic studies, pulse oximetry and re-evaluation of patient's condition.       Final diagnoses:  Anemia, unspecified type  Weakness  Leukocytosis, unspecified type  Lactic acidosis    ED Discharge Orders     None          Pat Lavonia LOISE, MD 02/24/24 1701

## 2024-02-24 NOTE — Consult Note (Signed)
 Reason for Consult:hemoperitoneum? Referring Physician: Dr Rolan Claudene Sharolyn LITTIE Jared [name] is an 78 y.o. male.  HPI: 61 yom with CHF, CKD, CLL came to ER today with weakness/lethargy and a fall from standing today. No prior ab pain or surgery.  On ASA 81 mg.  He was evaluated in the ER and found to have Hb of 5.6, fecal occult pos (apparently may have been having black stools as well), Lactic acid of 5.6 and was hypotensive.  CT head was negative. CT cspine with no acute findings. CT ab shows splenomegaly, large volume ascites throughout, some mixed density fluid collection along anterior gastric wall unsure of what this is.  Was asked to see him  Past Medical History:  Diagnosis Date   Arthritis    knees and back   Enlarged prostate    takes Flomax    Headache    Hypertension    stress test done about 12 yrs ago - results wnl per pt.    Past Surgical History:  Procedure Laterality Date   BACK SURGERY  2002   CORONARY PRESSURE/FFR STUDY N/A 04/23/2023   Procedure: CORONARY PRESSURE/FFR STUDY;  Surgeon: Elmira Newman PARAS, MD;  Location: [facility];  Service: Cardiovascular;  Laterality: N/A;   JOINT REPLACEMENT     KNEE ARTHROSCOPY Left 05/2004   thelbert 09/27/2010   RIGHT/LEFT HEART CATH AND CORONARY ANGIOGRAPHY N/A 04/23/2023   Procedure: RIGHT/LEFT HEART CATH AND CORONARY ANGIOGRAPHY;  Surgeon: Elmira Newman PARAS, MD;  Location: [facility];  Service: Cardiovascular;  Laterality: N/A;   SHOULDER ARTHROSCOPY WITH OPEN ROTATOR CUFF REPAIR Right 2009   /notes 09/13/2010   TOTAL HIP ARTHROPLASTY Left 10/02/2017   TOTAL HIP ARTHROPLASTY Left 10/02/2017   Procedure: LEFT TOTAL HIP ARTHROPLASTY ANTERIOR APPROACH;  Surgeon: Liam Lerner, MD;  Location: [facility];  Service: Orthopedics;  Laterality: Left;   TOTAL KNEE ARTHROPLASTY  04/16/2011   Procedure: TOTAL KNEE ARTHROPLASTY;  Surgeon: Lerner PARAS Liam;  Location: [facility];  Service: Orthopedics;  Laterality: Left;  LEFT TOTAL KNEE  ARTHROPLASTY    TOTAL KNEE ARTHROPLASTY Right 04/19/2014   Procedure: RIGHT TOTAL KNEE ARTHROPLASTY;  Surgeon: Lerner PARAS Liam, MD;  Location: [facility];  Service: Orthopedics;  Laterality: Right;    Family History  Problem Relation Age of Onset   Migraines Mother    Cancer Sister    Cancer Sister     Social History:  reports that he quit smoking about 49 years ago. His smoking use included cigarettes. He started smoking about 59 years ago. He has a 10 pack-year smoking history. He has never used smokeless tobacco. He reports that he does not drink alcohol and does not use drugs.  Allergies: No Known Allergies  Medications: Prior to Admission:  Medications Prior to Admission  Medication Sig Dispense Refill Last Dose/Taking   acetaminophen  (TYLENOL ) 650 MG CR tablet 650 mg as needed for pain.      amLODipine  (NORVASC ) 10 MG tablet Take 10 mg by mouth daily.      aspirin  EC (ASPIRIN  LOW DOSE) 81 MG tablet TAKE 1 TABLET (81 MG TOTAL) BY MOUTH DAILY. SWALLOW WHOLE. 450 tablet 3    atorvastatin (LIPITOR) 10 MG tablet Take 10 mg by mouth daily.      Azelastine HCl 137 MCG/SPRAY SOLN Place into both nostrils as needed (allergies).      brimonidine  (ALPHAGAN ) 0.2 % ophthalmic solution Place 1 drop into both eyes 2 (two) times daily.  Brimonidine -Dorzolamide  0.15-2 % SOLN Place 1 drop into the right eye daily.      empagliflozin (JARDIANCE) 10 MG TABS tablet Take 10 mg by mouth daily.      furosemide  (LASIX ) 20 MG tablet Take 1 tablet (20 mg total) by mouth daily. 30 tablet 3    isosorbide  mononitrate (IMDUR ) 30 MG 24 hr tablet Take 0.5 tablets (15 mg total) by mouth daily. You can take an additional 15 mg as needed for chest pain. 120 tablet 3    latanoprost  (XALATAN ) 0.005 % ophthalmic solution Place 1 drop into both eyes at bedtime.      lisinopril  (ZESTRIL ) 20 MG tablet Take 1 tablet (20 mg total) by mouth at bedtime. 90 tablet 3    Multiple Vitamin (MULTI VITAMIN) TABS daily at 6 (six) AM.       Multiple Vitamins-Minerals (MULTIVITAMINS THER. W/MINERALS) TABS Take 1 tablet by mouth daily.        nitroGLYCERIN  (NITROSTAT ) 0.4 MG SL tablet Place 1 tablet (0.4 mg total) under the tongue every 5 (five) minutes as needed for chest pain. 25 tablet 5    prednisoLONE acetate (PRED FORTE) 1 % ophthalmic suspension Place 1 drop into the right eye 4 (four) times daily.      sildenafil (VIAGRA) 100 MG tablet as needed for erectile dysfunction.      tamsulosin  (FLOMAX ) 0.4 MG CAPS capsule Take 0.4 mg by mouth daily after supper.        Results for orders placed or performed during the hospital encounter of 02/24/24 (from the past 48 hours)  Comprehensive metabolic panel     Status: Abnormal   Collection Time: 02/24/24  2:46 PM  Result Value Ref Range   Sodium 134 (L) 135 - 145 mmol/L   Potassium 4.3 3.5 - 5.1 mmol/L   Chloride 102 98 - 111 mmol/L   CO2 16 (L) 22 - 32 mmol/L   Glucose, Bld 148 (H) 70 - 99 mg/dL    Comment: Glucose reference range applies only to samples taken after fasting for at least 8 hours.   BUN 21 8 - 23 mg/dL   Creatinine, Ser 8.27 (H) 0.61 - 1.24 mg/dL   Calcium 8.7 (L) 8.9 - 10.3 mg/dL   Total Protein 5.4 (L) 6.5 - 8.1 g/dL   Albumin 3.4 (L) 3.5 - 5.0 g/dL   AST 28 15 - 41 U/L   ALT 13 0 - 44 U/L   Alkaline Phosphatase 252 (H) 38 - 126 U/L   Total Bilirubin 1.1 0.0 - 1.2 mg/dL   GFR, Estimated 40 (L) >60 mL/min    Comment: (NOTE) Calculated using the CKD-EPI Creatinine Equation (2021)    Anion gap 17 (H) 5 - 15    Comment: Performed at [facility], [address], [place], [place] [address]  CBC with Differential     Status: Abnormal (Preliminary result)   Collection Time: 02/24/24  2:46 PM  Result Value Ref Range   WBC 29.0 (H) 4.0 - 10.5 K/uL   RBC 1.81 (L) 4.22 - 5.81 MIL/uL   Hemoglobin 5.6 (LL) 13.0 - 17.0 g/dL    Comment: REPEATED TO VERIFY This critical result has been called to P.DOWD, RN by Rankin Hummer on 02/24/2024  15:50:50, and has been read back. CRITICAL RESULT VERIFIED.    HCT 19.3 (L) 39.0 - 52.0 %   MCV 106.6 (H) 80.0 - 100.0 fL   MCH 30.9 26.0 - 34.0 pg   MCHC 29.0 (L)  30.0 - 36.0 g/dL   RDW 71.7 (H) 88.4 - 84.4 %   Platelets 193 150 - 400 K/uL   nRBC 2.5 (H) 0.0 - 0.2 %    Comment: Performed at [facility], [address], [place], [place] [address]   Neutrophils Relative % PENDING %   Neutro Abs PENDING 1.7 - 7.7 K/uL   Band Neutrophils PENDING %   Lymphocytes Relative PENDING %   Lymphs Abs PENDING 0.7 - 4.0 K/uL   Monocytes Relative PENDING %   Monocytes Absolute PENDING 0.1 - 1.0 K/uL   Eosinophils Relative PENDING %   Eosinophils Absolute PENDING 0.0 - 0.5 K/uL   Basophils Relative PENDING %   Basophils Absolute PENDING 0.0 - 0.1 K/uL   WBC Morphology PENDING    RBC Morphology PENDING    Smear Review PENDING    Other PENDING %   nRBC PENDING 0 /100 WBC   Metamyelocytes Relative PENDING %   Myelocytes PENDING %   Promyelocytes Relative PENDING %   Blasts PENDING %   Immature Granulocytes PENDING %   Abs Immature Granulocytes PENDING 0.00 - 0.07 K/uL  Protime-INR     Status: Abnormal   Collection Time: 02/24/24  2:46 PM  Result Value Ref Range   Prothrombin Time 18.1 (H) 11.4 - 15.2 seconds   INR 1.4 (H) 0.8 - 1.2    Comment: (NOTE) INR goal varies based on device and disease states. Performed at [facility], [address], [place], [place] [address]   I-Stat Lactic Acid, ED     Status: Abnormal   Collection Time: 02/24/24  2:49 PM  Result Value Ref Range   Lactic Acid, Venous 5.7 (HH) 0.5 - 1.9 mmol/L   Comment NOTIFIED PHYSICIAN   [facility] screen  [facility]     Status: None (Preliminary result)   Collection Time: 02/24/24  4:01 PM  Result Value Ref Range   ABO/RH(D) O POS    Antibody Screen NEG    Sample Expiration 02/27/2024,2359    Unit Number [id number]    Blood Component Type RED CELLS,LR     Unit division 00    Status of Unit ISSUED    Transfusion Status OK TO TRANSFUSE    Crossmatch Result      COMPATIBLE Performed at [facility], [address] [place], [place] [address]    Unit Number [id number]    Blood Component Type RED CELLS,LR    Unit division 00    Status of Unit ISSUED    Transfusion Status OK TO TRANSFUSE    Crossmatch Result COMPATIBLE   Prepare RBC (crossmatch)     Status: None   Collection Time: 02/24/24  4:01 PM  Result Value Ref Range   Order Confirmation      ORDER PROCESSED BY BLOOD BANK Performed at [facility], [address], [place], [place] [address]   POC occult blood, ED Provider will collect     Status: Abnormal   Collection Time: 02/24/24  4:34 PM  Result Value Ref Range   Fecal Occult Bld POSITIVE (A) NEGATIVE    CT Head Wo Contrast Result Date: 02/24/2024 CLINICAL DATA:  Fall, blunt trauma EXAM: CT HEAD WITHOUT CONTRAST TECHNIQUE: Contiguous axial images were obtained from the base of the skull through the vertex without intravenous contrast. RADIATION DOSE REDUCTION: This exam was performed according to the departmental dose-optimization program which includes automated exposure control, adjustment of the mA  and/or kV according to patient size and/or use of iterative reconstruction technique. COMPARISON:  None Available. FINDINGS: Brain: No acute intracranial abnormality. Specifically, no hemorrhage, hydrocephalus, mass lesion, acute infarction, or significant intracranial injury. Vascular: No hyperdense vessel or unexpected calcification. Skull: No acute calvarial abnormality. Sinuses/Orbits: No acute findings Other: None IMPRESSION: No acute intracranial abnormality. Electronically Signed   By: Franky Crease M.D.   On: 02/24/2024 17:12   CT ABDOMEN PELVIS W CONTRAST Result Date: 02/24/2024 CLINICAL DATA:  Nausea, vomiting, distended abdomen.  Recent fall. EXAM: CT ABDOMEN AND PELVIS WITH CONTRAST  TECHNIQUE: Multidetector CT imaging of the abdomen and pelvis was performed using the standard protocol following bolus administration of intravenous contrast. RADIATION DOSE REDUCTION: This exam was performed according to the departmental dose-optimization program which includes automated exposure control, adjustment of the mA and/or kV according to patient size and/or use of iterative reconstruction technique. CONTRAST:  OMNIPAQUE  IOHEXOL  300 MG/ML  SOLN COMPARISON:  03/27/2018 FINDINGS: Lower chest: Airspace disease noted in both lower lungs, most pronounced in the left lower lobe concerning for pneumonia. No effusions. Cardiomegaly. Coronary artery disease. Hepatobiliary: Heterogeneous appearance of the liver which appears enlarged. No focal hepatic abnormality. Small gallstone within the gallbladder which is otherwise unremarkable. Pancreas: No focal abnormality or ductal dilatation. Spleen: Spleen is enlarged with a craniocaudal length of 13.5 cm. No focal abnormality. Adrenals/Urinary Tract: Bilateral renal cysts appear benign. No follow-up imaging recommended. No suspicious renal or adrenal abnormality. No hydronephrosis. Urinary bladder unremarkable. Stomach/Bowel: Stomach, large and small bowel grossly unremarkable. Vascular/Lymphatic: Aortic atherosclerosis. No evidence of aneurysm or adenopathy. Reproductive: Obscured by beam hardening artifact from left hip replacement. Other: Large volume ascites in the abdomen and pelvis. There appears to be a mixed density fluid collection noted along the anterior gastric wall which could reflect a gastric wall or perigastric hematoma measuring 11.1 x 3.5 cm. Musculoskeletal: No acute bony abnormality. IMPRESSION: Heterogeneous enhancement throughout the liver with hepatomegaly. Splenomegaly. Large volume ascites throughout the abdomen and pelvis. There appears to be a complex fluid collection with mass effect anterior to the stomach concerning for possible  gastric wall or perigastric hematoma. Patchy airspace opacities in the lower lungs bilaterally, left greater than right concerning for pneumonia. Coronary artery disease, aortic atherosclerosis. Electronically Signed   By: Franky Crease M.D.   On: 02/24/2024 17:11   CT Cervical Spine Wo Contrast Result Date: 02/24/2024 CLINICAL DATA:  Fall, blunt trauma EXAM: CT CERVICAL SPINE WITHOUT CONTRAST TECHNIQUE: Multidetector CT imaging of the cervical spine was performed without intravenous contrast. Multiplanar CT image reconstructions were also generated. RADIATION DOSE REDUCTION: This exam was performed according to the departmental dose-optimization program which includes automated exposure control, adjustment of the mA and/or kV according to patient size and/or use of iterative reconstruction technique. COMPARISON:  None Available. FINDINGS: Alignment: Mild reversal the normal cervical lordosis. 3 mm of grade 1 degenerative anterolisthesis at C7-T1. Skull base and vertebrae: Anterior spurring at the C1-2 articulation. Bilateral degenerative sternoclavicular arthropathy. No cervical spine fracture or acute bony findings identified. Soft tissues and spinal canal: Right common carotid atheromatous vascular calcification. Disc levels: C2-3: Prominent left foraminal stenosis due to uncinate and facet spurring. C3-4: Prominent bilateral foraminal stenosis and moderate central stenosis due to calcified central disc protrusion, uncinate spurring, and short pedicles. C4-5: Prominent bilateral foraminal stenosis and moderate central stenosis due to disc bulge, uncinate spurring, and short pedicles. Possible shallow left paracentral disc protrusion. C5-6: Moderate to prominent bilateral foraminal stenosis due to uncinate  and facet spurring and short pedicles. C6-7: Moderate to prominent left and moderate right foraminal stenosis with borderline central stenosis due to disc osteophyte complex and uncinate spurring. C7-T1:  Moderate bilateral foraminal stenosis due to disc uncovering and facet arthropathy. Upper chest: Mild biapical pleuroparenchymal scarring. Other: No supplemental non-categorized findings. IMPRESSION: 1. No acute cervical spine findings. 2. Substantial multilevel impingement due to cervical spondylosis, degenerative disc disease, and short pedicles. 3. Bilateral degenerative sternoclavicular arthropathy. 4. Right common carotid atheromatous vascular calcification. Electronically Signed   By: Ryan Salvage M.D.   On: 02/24/2024 16:43    Review of Systems Blood pressure (!) 102/55, pulse 97, temperature 98 F (36.7 C), temperature source Oral, resp. rate 20, height 5' 11 (1.803 m), weight 76.8 kg, SpO2 91%. Physical Exam Vitals reviewed.  Constitutional:      Appearance: Normal appearance.  Cardiovascular:     Rate and Rhythm: Normal rate.  Abdominal:     General: There is no distension.     Palpations: Abdomen is soft.     Tenderness: There is no abdominal tenderness.  Neurological:     Mental Status: He is alert.     Assessment/Plan: ? Hemoperitoneum vs contained perforation -unsure if this is related to fall and what exactly CT finding is. -Hb quite low with black stools which certainly can be cause of his fall and needs evaluation -no evidence of any hemorrhage that needs surgical treatment at this point  -we will follow with you    Donnice Bury 02/24/2024, 7:29 PM

## 2024-02-24 NOTE — ED Notes (Signed)
 Straight stuck for labs and 2nd set of cultures RFA

## 2024-02-24 NOTE — Progress Notes (Signed)
 Discussed case with NP.  Hopeful to avoid needing angiography and see if can settle out with balanced rescuscitation. Likewise surgery likely prohibitively risky unless no other options.  Avoid acidemia, hypocalcemia, hypothermia, coagulopathy.  Ppi and octreotide  Patient is DNR, hopeful to stabilize, get upper gi series if perforation is a concern followed by egd.  Rolan Sharps md PCCM

## 2024-02-24 NOTE — ED Triage Notes (Signed)
 Pt bib EMS from home. Today reports weakness and fell in bathroom. Denies blood thinner use and denies hitting head. Reports nausea and vomiting. Hx of blood cancer. Does receive treatments. BP 100/50s 250 NS given. 93 O2 HR 87 171 CBG

## 2024-02-24 NOTE — Progress Notes (Signed)
 Have spoken w IR CCS and GI   Plan if he becomes unstable or h/h is dropping is for CTA GI, notify IR CCS aware -- in case right now and will review case/imaging when able, but in capacity of our discussion discussion agree w above -- surgical involvement only if absolutely needed  GI to see in AM unless needed sooner   Plan of care as per H&P  Ronnald Gave MSN, AGACNP-BC [facility] Pulmonary/Critical Care Medicine 02/24/2024, 6:37 PM

## 2024-02-25 ENCOUNTER — Inpatient Hospital Stay (HOSPITAL_COMMUNITY)

## 2024-02-25 DIAGNOSIS — R7401 Elevation of levels of liver transaminase levels: Secondary | ICD-10-CM | POA: Diagnosis not present

## 2024-02-25 DIAGNOSIS — N179 Acute kidney failure, unspecified: Secondary | ICD-10-CM | POA: Diagnosis not present

## 2024-02-25 DIAGNOSIS — D72829 Elevated white blood cell count, unspecified: Secondary | ICD-10-CM

## 2024-02-25 DIAGNOSIS — D62 Acute posthemorrhagic anemia: Secondary | ICD-10-CM | POA: Diagnosis not present

## 2024-02-25 DIAGNOSIS — K922 Gastrointestinal hemorrhage, unspecified: Secondary | ICD-10-CM | POA: Diagnosis not present

## 2024-02-25 LAB — DIC (DISSEMINATED INTRAVASCULAR COAGULATION)PANEL
D-Dimer, Quant: 7.06 ug{FEU}/mL — ABNORMAL HIGH (ref 0.00–0.50)
Fibrinogen: 385 mg/dL (ref 210–475)
INR: 1.4 — ABNORMAL HIGH (ref 0.8–1.2)
Platelets: 182 K/uL (ref 150–400)
Prothrombin Time: 18.3 s — ABNORMAL HIGH (ref 11.4–15.2)
aPTT: 37 s — ABNORMAL HIGH (ref 24–36)

## 2024-02-25 LAB — URINALYSIS, ROUTINE W REFLEX MICROSCOPIC
Bacteria, UA: NONE SEEN
Bilirubin Urine: NEGATIVE
Glucose, UA: >=500 mg/dL — AB
Hgb urine dipstick: NEGATIVE
Ketones, ur: 5 mg/dL — AB
Leukocytes,Ua: NEGATIVE
Nitrite: NEGATIVE
Protein, ur: NEGATIVE mg/dL
Specific Gravity, Urine: 1.031 — ABNORMAL HIGH (ref 1.005–1.030)
pH: 5 (ref 5.0–8.0)

## 2024-02-25 LAB — CBC
HCT: 24 % — ABNORMAL LOW (ref 39.0–52.0)
HCT: 23.6 % — ABNORMAL LOW (ref 39.0–52.0)
Hemoglobin: 7.2 g/dL — ABNORMAL LOW (ref 13.0–17.0)
Hemoglobin: 7.1 g/dL — ABNORMAL LOW (ref 13.0–17.0)
MCH: 29.6 pg (ref 26.0–34.0)
MCH: 29.7 pg (ref 26.0–34.0)
MCHC: 30 g/dL (ref 30.0–36.0)
MCHC: 30.1 g/dL (ref 30.0–36.0)
MCV: 98.8 fL (ref 80.0–100.0)
MCV: 98.7 fL (ref 80.0–100.0)
Platelets: 178 K/uL (ref 150–400)
Platelets: 174 K/uL (ref 150–400)
RBC: 2.43 MIL/uL — ABNORMAL LOW (ref 4.22–5.81)
RBC: 2.39 MIL/uL — ABNORMAL LOW (ref 4.22–5.81)
RDW: 25.3 % — ABNORMAL HIGH (ref 11.5–15.5)
RDW: 26 % — ABNORMAL HIGH (ref 11.5–15.5)
WBC: 28.3 K/uL — ABNORMAL HIGH (ref 4.0–10.5)
WBC: 28.7 K/uL — ABNORMAL HIGH (ref 4.0–10.5)
nRBC: 4 % — ABNORMAL HIGH (ref 0.0–0.2)
nRBC: 4.3 % — ABNORMAL HIGH (ref 0.0–0.2)

## 2024-02-25 LAB — BASIC METABOLIC PANEL WITH GFR
Anion gap: 16 — ABNORMAL HIGH (ref 5–15)
BUN: 25 mg/dL — ABNORMAL HIGH (ref 8–23)
CO2: 18 mmol/L — ABNORMAL LOW (ref 22–32)
Calcium: 8.5 mg/dL — ABNORMAL LOW (ref 8.9–10.3)
Chloride: 104 mmol/L (ref 98–111)
Creatinine, Ser: 2.09 mg/dL — ABNORMAL HIGH (ref 0.61–1.24)
GFR, Estimated: 31 mL/min — ABNORMAL LOW (ref >60)
Glucose, Bld: 115 mg/dL — ABNORMAL HIGH (ref 70–99)
Potassium: 4.8 mmol/L (ref 3.5–5.1)
Sodium: 138 mmol/L (ref 135–145)

## 2024-02-25 LAB — LACTIC ACID, PLASMA
Lactic Acid, Venous: 1.9 mmol/L (ref 0.5–1.9)
Lactic Acid, Venous: 1.5 mmol/L (ref 0.5–1.9)

## 2024-02-25 LAB — HEMOGLOBIN AND HEMATOCRIT, BLOOD
HCT: 23.4 % — ABNORMAL LOW (ref 39.0–52.0)
Hemoglobin: 7.1 g/dL — ABNORMAL LOW (ref 13.0–17.0)

## 2024-02-25 MED ORDER — SODIUM BICARBONATE 8.4 % IV SOLN
50.0000 meq | Freq: Once | INTRAVENOUS | Status: AC
  Administered 2024-02-25: 50 meq via INTRAVENOUS
  Filled 2024-02-25: qty 50

## 2024-02-25 MED ORDER — IOHEXOL 300 MG/ML  SOLN
100.0000 mL | Freq: Once | INTRAMUSCULAR | Status: AC | PRN
  Administered 2024-02-25: 100 mL via ORAL

## 2024-02-25 MED ORDER — NOREPINEPHRINE 4 MG/250ML-% IV SOLN
0.0000 ug/min | INTRAVENOUS | Status: DC
Start: 2024-02-25 — End: 2024-02-25

## 2024-02-25 MED ORDER — LACTATED RINGERS IV BOLUS
500.0000 mL | Freq: Once | INTRAVENOUS | Status: AC
  Administered 2024-02-25: 500 mL via INTRAVENOUS

## 2024-02-25 MED ORDER — NOREPINEPHRINE 4 MG/250ML-% IV SOLN
0.0000 ug/min | INTRAVENOUS | Status: DC
  Administered 2024-02-25: 2 ug/min via INTRAVENOUS
  Filled 2024-02-25: qty 250

## 2024-02-25 MED ORDER — SODIUM CHLORIDE 0.9 % IV SOLN
250.0000 mL | INTRAVENOUS | Status: AC
  Administered 2024-02-25 (×2): 250 mL via INTRAVENOUS

## 2024-02-25 NOTE — Progress Notes (Signed)
 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 Hypotensive prehospital and given small IVF bolus. In ED anemic hgb 5.6, + fecal occult, LA 5.6 and SBP low 90s. CT a/p with contrast shows a possible gastric wall / perigastric hematoma or other complex fluid collection. Also BLL (L>R) ASD,ascites, splenomegaly and hepatomegaly.   Getting 2 PRBC  GI has been called by EM  PCCM to admit    In my d/w pt, in addition to weakness/malaise above he endorses unintentional wt loss, loss of appetite and abd pain a few weeks. Endorsed emesis today. No hematemesis, no coffee ground emesis. Endorses darker stools than usual -- thought maybe related to iron but isnt sure. Wife suspects melena.   Pertinent  Medical History  CLL MDS HTN CKD3a dHF  Significant Hospital Events: Including procedures, antibiotic start and stop dates in addition to other pertinent events   10/13 admit to ICU w GIB  2 PRBC  10/14 GI consult  Interim History / Subjective:   Was put on low dose NE overnigh Labs at 3am w Cr up to 2.09, serum bicarb 18  wbc 29 hgb 7.1 plt 174   Objective    Blood pressure (!) 111/48, pulse 84, temperature 99 F (37.2 C), temperature source Oral, resp. rate (!) 28, height 5' 11 (1.803 m), weight 79.7 kg, SpO2 93%.        Intake/Output Summary (Last 24 hours) at 02/25/2024 0900 Last data filed at 02/25/2024 0749 Gross per 24 hour  Intake 3558.84 ml  Output --  Net 3558.84 ml   Filed Weights   02/24/24 1419 02/24/24 1839 02/25/24 0500  Weight: 77.2 kg 76.8 kg 79.7 kg    Examination: General: very pleasant chronically ill elderly M NAD  HENT: NCAT pale tacky mucus membranes anicteric sclera  Lungs: CTA on RA  Cardiovascular:  s1s2 cap refill < 3 sec  Abdomen: round soft mild ttp lower quadrants  Extremities: no cyanosis or clubbing  Neuro: aaox4  GU: defer  Resolved problem list  Lactic acidosis  Assessment and Plan    ABLA / hemorrhagic shock  GIB Gastric wall v perigastric hematoma Ascites -- possible hemoperitoneum   Hepatosplenomegaly  Elevated LFTs AKI on CKD 3a AGMA CAP Leukocytosis  -AoC. 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 Defer unless needed w AKI on CKD    Labs   CBC: Recent Labs  Lab 02/21/24 0944 02/24/24 1446 02/25/24 0004 02/25/24 0008 02/25/24 0310  WBC 22.2* 29.0*  --  28.3* 28.7*  NEUTROABS 17.9* 22.1*  --   --   --   HGB 8.0* 5.6*  --  7.2* 7.1*  HCT 26.3* 19.3*  --  24.0* 23.6*  MCV 99.2 106.6*  --  98.8 98.7  PLT 166 193 182 178 174    Basic Metabolic Panel: Recent Labs  Lab 02/24/24 1446 02/25/24 0310  NA 134* 138  K 4.3 4.8  CL 102 104  CO2 16* 18*  GLUCOSE 148* 115*  BUN 21 25*  CREATININE 1.72* 2.09*  CALCIUM 8.7* 8.5*   GFR: Estimated Creatinine Clearance: 30 mL/min (A) (by C-G formula based on SCr of 2.09 mg/dL (H)). Recent Labs  Lab 02/21/24 0944 02/24/24 1446 02/24/24 1449 02/25/24 0004 02/25/24 0008 02/25/24 0310  WBC 22.2* 29.0*  --   --  28.3* 28.7*  LATICACIDVEN  --   --  5.7* 1.9  --  1.5    Liver Function Tests: Recent Labs  Lab 02/24/24 1446  AST 28  ALT 13  ALKPHOS 252*  BILITOT 1.1  PROT 5.4*  ALBUMIN 3.4*   No results for input(s): LIPASE, AMYLASE in the last 168 hours. No results for input(s): AMMONIA in the  last 168 hours.  ABG    Component Value Date/Time   PHART 7.378 04/23/2023 0955   PCO2ART 35.5 04/23/2023 0955   PO2ART 78 (L) 04/23/2023 0955   HCO3 23.1 04/23/2023 0955   HCO3 20.9 04/23/2023 0955   TCO2 24 04/23/2023 0955   TCO2 22 04/23/2023 0955   ACIDBASEDEF 2.0 04/23/2023 0955   ACIDBASEDEF 4.0 (H) 04/23/2023 0955   O2SAT 68 04/23/2023 0955   O2SAT 95 04/23/2023 0955     Coagulation Profile: Recent Labs  Lab 02/24/24 1446 02/25/24 0004  INR 1.4* 1.4*    Cardiac Enzymes: No results for input(s): CKTOTAL, CKMB, CKMBINDEX, TROPONINI in the last 168 hours.  HbA1C: No results found for: HGBA1C  CBG: No results for input(s): GLUCAP in the last 168 hours.  CRITICAL CARE Performed by: Ronnald FORBES Gave   Total critical care time: 38 minutes  Critical care time was exclusive of separately billable procedures and treating other patients. Critical care was necessary to treat or prevent imminent or life-threatening deterioration.  Critical care was time spent personally by me on the following activities: development of treatment plan with patient and/or surrogate as well as nursing, discussions with consultants, evaluation of patient's response to treatment, examination of patient, obtaining history from patient or surrogate, ordering and performing treatments and interventions, ordering and review of laboratory studies, ordering and review of radiographic studies, pulse oximetry and re-evaluation of patient's condition.  Ronnald Gave MSN, AGACNP-BC [facility] Pulmonary/Critical Care Medicine Amion for pager  02/25/2024, 9:00 AM

## 2024-02-25 NOTE — Plan of Care (Signed)
?  Problem: Education: ?Goal: Knowledge of General Education information will improve ?Description: Including pain rating scale, medication(s)/side effects and non-pharmacologic comfort measures ?Outcome: Progressing ?  ?Problem: Activity: ?Goal: Risk for activity intolerance will decrease ?Outcome: Progressing ?  ?Problem: Elimination: ?Goal: Will not experience complications related to bowel motility ?Outcome: Progressing ?Goal: Will not experience complications related to urinary retention ?Outcome: Progressing ?  ?Problem: Safety: ?Goal: Ability to remain free from injury will improve ?Outcome: Progressing ?  ?Problem: Skin Integrity: ?Goal: Risk for impaired skin integrity will decrease ?Outcome: Progressing ?  ?

## 2024-02-25 NOTE — Consult Note (Signed)
 Referring Provider: ICU team Primary Care Physician:  [name], FNP Primary Gastroenterologist:  Dr. Rosalie  Reason for Consultation: GI bleed  HPI: Jared [name] is a 78 y.o. 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  Was found to have worsening of anemia with hemoglobin of 5.6.  With baseline hemoglobin of around 8-9.  Initial workup showed chronic leukocytosis, mildly elevated alkaline phosphatase at 252, creatinine of 1.72, occult blood positive.  CT abdomen pelvis with IV contrast showed mixed density fluid collection along the anterior gastric wall measuring around 11 cm concerning for gastric wall or perigastric hematoma.  CT also showed large volume ascites.  Patient seen and examined at bedside.  Family at bedside.  According to patient's wife, patient tripped and fall and subsequently started having abdominal pain.  He is denying any black tarry stools.  The stool has been dark brown since taking iron supplements.  Denies seeing any bright red blood per rectum.  Denies any history of ulcer disease.  Denies NSAID use.  Past Medical History:  Diagnosis Date   Arthritis    knees and back   Enlarged prostate    takes Flomax    Headache    Hypertension    stress test done about 12 yrs ago - results wnl per pt.    Past Surgical History:  Procedure Laterality Date   BACK SURGERY  2002   CORONARY PRESSURE/FFR STUDY N/A 04/23/2023   Procedure: CORONARY PRESSURE/FFR STUDY;  Surgeon: Elmira Newman PARAS, MD;  Location: [facility];  Service: Cardiovascular;  Laterality: N/A;   JOINT REPLACEMENT     KNEE ARTHROSCOPY Left 05/2004   thelbert 09/27/2010   RIGHT/LEFT HEART CATH AND CORONARY ANGIOGRAPHY N/A 04/23/2023   Procedure: RIGHT/LEFT HEART CATH AND CORONARY ANGIOGRAPHY;  Surgeon: Elmira Newman PARAS, MD;  Location: [facility];  Service: Cardiovascular;  Laterality: N/A;   SHOULDER ARTHROSCOPY WITH OPEN ROTATOR CUFF REPAIR Right 2009   /notes 09/13/2010   TOTAL HIP ARTHROPLASTY Left 10/02/2017   TOTAL HIP ARTHROPLASTY Left 10/02/2017   Procedure: LEFT TOTAL HIP ARTHROPLASTY ANTERIOR APPROACH;  Surgeon: Liam Lerner, MD;  Location: [facility] OR;  Service: Orthopedics;  Laterality: Left;   TOTAL KNEE ARTHROPLASTY  04/16/2011   Procedure: TOTAL KNEE ARTHROPLASTY;  Surgeon: Lerner PARAS Liam;  Location: [facility];  Service: Orthopedics;  Laterality: Left;  LEFT TOTAL KNEE ARTHROPLASTY    TOTAL KNEE ARTHROPLASTY Right 04/19/2014   Procedure: RIGHT TOTAL KNEE ARTHROPLASTY;  Surgeon: Lerner PARAS Liam, MD;  Location: [facility] OR;  Service: Orthopedics;  Laterality: Right;    Prior to Admission medications   Medication Sig Start Date End Date Taking? Authorizing Provider  acetaminophen  (TYLENOL ) 650 MG CR tablet 650 mg as needed for pain.    [provider]  amLODipine  (NORVASC ) 10 MG tablet Take 10 mg by mouth daily.    [provider]  aspirin  EC (ASPIRIN  LOW DOSE) 81 MG tablet TAKE 1 TABLET (81 MG TOTAL) BY MOUTH DAILY. SWALLOW WHOLE. 09/05/23   Patwardhan, Newman PARAS, MD  atorvastatin (LIPITOR) 10 MG tablet Take 10 mg by mouth daily. 12/22/21   [provider]  Azelastine HCl 137 MCG/SPRAY SOLN Place into both nostrils as needed (allergies). 05/15/23   [provider]  brimonidine  (ALPHAGAN ) 0.2 % ophthalmic solution Place 1 drop into both eyes 2 (two) times  daily.     [provider]  Brimonidine -Dorzolamide  0.15-2 % SOLN Place 1 drop into the right eye daily.    [provider]  empagliflozin (JARDIANCE) 10 MG TABS tablet Take 10 mg by mouth daily.    [provider]  furosemide  (LASIX ) 20 MG tablet Take 1 tablet (20 mg total) by mouth daily. 04/23/23 04/22/24  Patwardhan, Newman PARAS, MD  isosorbide  mononitrate (IMDUR ) 30 MG 24 hr tablet Take 0.5  tablets (15 mg total) by mouth daily. You can take an additional 15 mg as needed for chest pain. 05/17/23 02/14/24  Wyn Jackee VEAR Mickey., NP  latanoprost  (XALATAN ) 0.005 % ophthalmic solution Place 1 drop into both eyes at bedtime.    [provider]  lisinopril  (ZESTRIL ) 20 MG tablet Take 1 tablet (20 mg total) by mouth at bedtime. 05/29/23   Wyn Jackee VEAR Mickey., NP  Multiple Vitamin (MULTI VITAMIN) TABS daily at 6 (six) AM.    [provider]  Multiple Vitamins-Minerals (MULTIVITAMINS THER. W/MINERALS) TABS Take 1 tablet by mouth daily.      [provider]  nitroGLYCERIN  (NITROSTAT ) 0.4 MG SL tablet Place 1 tablet (0.4 mg total) under the tongue every 5 (five) minutes as needed for chest pain. 04/17/23   Patwardhan, Newman PARAS, MD  prednisoLONE acetate (PRED FORTE) 1 % ophthalmic suspension Place 1 drop into the right eye 4 (four) times daily. 05/03/23   [provider]  sildenafil (VIAGRA) 100 MG tablet as needed for erectile dysfunction.    [provider]  tamsulosin  (FLOMAX ) 0.4 MG CAPS capsule Take 0.4 mg by mouth daily after supper.     [provider]    Scheduled Meds:  sodium chloride    Intravenous Once   Chlorhexidine  Gluconate Cloth  6 each Topical Daily   pantoprazole  (PROTONIX ) IV  40 mg Intravenous Q12H   Continuous Infusions:  sodium chloride      sodium chloride  Stopped (02/25/24 0158)   lactated ringers  Stopped (02/24/24 2019)   octreotide (SANDOSTATIN) 500 mcg in sodium chloride  0.9 % 250 mL (2 mcg/mL) infusion 50 mcg/hr (02/25/24 0749)   piperacillin-tazobactam (ZOSYN)  IV 12.5 mL/hr at 02/25/24 0749   PRN Meds:.mouth rinse  Allergies as of 02/24/2024   (No Known Allergies)    Family History  Problem Relation Age of Onset   Migraines Mother    Cancer Sister    Cancer Sister     Social History   Socioeconomic History   Marital status: Married    Spouse name: Not on file   Number of children: Not on file   Years  of education: Not on file   Highest education level: Not on file  Occupational History   Not on file  Tobacco Use   Smoking status: Former    Current packs/day: 0.00    Average packs/day: 1 pack/day for 10.0 years (10.0 ttl pk-yrs)    Types: Cigarettes    Start date: 09/06/1964    Quit date: 09/07/1974    Years since quitting: 49.5   Smokeless tobacco: Never  Vaping Use   Vaping status: Never Used  Substance and Sexual Activity   Alcohol use: No    Comment: quit 1976 - beer   Drug use: No   Sexual activity: Not on file  Other Topics Concern   Not on file  Social History Narrative   Not on file   Social Drivers of Health   Financial Resource Strain: Not on file  Food Insecurity: No  Food Insecurity (02/24/2024)   Hunger Vital Sign    Worried About Running Out of Food in the Last Year: Never true    Ran Out of Food in the Last Year: Never true  Transportation Needs: No Transportation Needs (02/24/2024)   PRAPARE - Administrator, Civil Service (Medical): No    Lack of Transportation (Non-Medical): No  Physical Activity: Not on file  Stress: Not on file  Social Connections: Socially Integrated (02/24/2024)   Social Connection and Isolation Panel    Frequency of Communication with Friends and Family: Three times a week    Frequency of Social Gatherings with Friends and Family: Three times a week    Attends Religious Services: More than 4 times per year    Active Member of Clubs or Organizations: Yes    Attends Banker Meetings: More than 4 times per year    Marital Status: Married  Catering manager Violence: Not At Risk (02/24/2024)   Humiliation, Afraid, Rape, and Kick questionnaire    Fear of Current or Ex-Partner: No    Emotionally Abused: No    Physically Abused: No    Sexually Abused: No    Review of Systems: All negative except as stated above in HPI.  Physical Exam: Vital signs: Vitals:   02/25/24 0740 02/25/24 0800  BP:  (!) 111/48   Pulse:  84  Resp:  (!) 28  Temp: 99 F (37.2 C)   SpO2:  93%   Last BM Date : 02/23/24 General:   Alert,  Well-developed, well-nourished, pleasant and cooperative in NAD Lungs: No visible respiratory distress Heart:  Regular rate and rhythm; no murmurs, clicks, rubs,  or gallops. Abdomen: Mild epigastric discomfort and tenderness on palpation, bowel sound present, no peritoneal signs mood and affect normal Alert and oriented x 3 Rectal:  Deferred  GI:  Lab Results: Recent Labs    02/24/24 1446 02/25/24 0004 02/25/24 0008 02/25/24 0310  WBC 29.0*  --  28.3* 28.7*  HGB 5.6*  --  7.2* 7.1*  HCT 19.3*  --  24.0* 23.6*  PLT 193 182 178 174   BMET Recent Labs    02/24/24 1446 02/25/24 0310  NA 134* 138  K 4.3 4.8  CL 102 104  CO2 16* 18*  GLUCOSE 148* 115*  BUN 21 25*  CREATININE 1.72* 2.09*  CALCIUM 8.7* 8.5*   LFT Recent Labs    02/24/24 1446  PROT 5.4*  ALBUMIN 3.4*  AST 28  ALT 13  ALKPHOS 252*  BILITOT 1.1   PT/INR Recent Labs    02/24/24 1446 02/25/24 0004  LABPROT 18.1* 18.3*  INR 1.4* 1.4*     Studies/Results: CT Head Wo Contrast Result Date: 02/24/2024 CLINICAL DATA:  Fall, blunt trauma EXAM: CT HEAD WITHOUT CONTRAST TECHNIQUE: Contiguous axial images were obtained from the base of the skull through the vertex without intravenous contrast. RADIATION DOSE REDUCTION: This exam was performed according to the departmental dose-optimization program which includes automated exposure control, adjustment of the mA and/or kV according to patient size and/or use of iterative reconstruction technique. COMPARISON:  None Available. FINDINGS: Brain: No acute intracranial abnormality. Specifically, no hemorrhage, hydrocephalus, mass lesion, acute infarction, or significant intracranial injury. Vascular: No hyperdense vessel or unexpected calcification. Skull: No acute calvarial abnormality. Sinuses/Orbits: No acute findings Other: None IMPRESSION: No acute  intracranial abnormality. Electronically Signed   By: Franky Crease M.D.   On: 02/24/2024 17:12   CT ABDOMEN PELVIS W CONTRAST Result  Date: 02/24/2024 CLINICAL DATA:  Nausea, vomiting, distended abdomen.  Recent fall. EXAM: CT ABDOMEN AND PELVIS WITH CONTRAST TECHNIQUE: Multidetector CT imaging of the abdomen and pelvis was performed using the standard protocol following bolus administration of intravenous contrast. RADIATION DOSE REDUCTION: This exam was performed according to the departmental dose-optimization program which includes automated exposure control, adjustment of the mA and/or kV according to patient size and/or use of iterative reconstruction technique. CONTRAST:  OMNIPAQUE  IOHEXOL  300 MG/ML  SOLN COMPARISON:  03/27/2018 FINDINGS: Lower chest: Airspace disease noted in both lower lungs, most pronounced in the left lower lobe concerning for pneumonia. No effusions. Cardiomegaly. Coronary artery disease. Hepatobiliary: Heterogeneous appearance of the liver which appears enlarged. No focal hepatic abnormality. Small gallstone within the gallbladder which is otherwise unremarkable. Pancreas: No focal abnormality or ductal dilatation. Spleen: Spleen is enlarged with a craniocaudal length of 13.5 cm. No focal abnormality. Adrenals/Urinary Tract: Bilateral renal cysts appear benign. No follow-up imaging recommended. No suspicious renal or adrenal abnormality. No hydronephrosis. Urinary bladder unremarkable. Stomach/Bowel: Stomach, large and small bowel grossly unremarkable. Vascular/Lymphatic: Aortic atherosclerosis. No evidence of aneurysm or adenopathy. Reproductive: Obscured by beam hardening artifact from left hip replacement. Other: Large volume ascites in the abdomen and pelvis. There appears to be a mixed density fluid collection noted along the anterior gastric wall which could reflect a gastric wall or perigastric hematoma measuring 11.1 x 3.5 cm. Musculoskeletal: No acute bony abnormality.  IMPRESSION: Heterogeneous enhancement throughout the liver with hepatomegaly. Splenomegaly. Large volume ascites throughout the abdomen and pelvis. There appears to be a complex fluid collection with mass effect anterior to the stomach concerning for possible gastric wall or perigastric hematoma. Patchy airspace opacities in the lower lungs bilaterally, left greater than right concerning for pneumonia. Coronary artery disease, aortic atherosclerosis. Electronically Signed   By: Franky Crease M.D.   On: 02/24/2024 17:11   CT Cervical Spine Wo Contrast Result Date: 02/24/2024 CLINICAL DATA:  Fall, blunt trauma EXAM: CT CERVICAL SPINE WITHOUT CONTRAST TECHNIQUE: Multidetector CT imaging of the cervical spine was performed without intravenous contrast. Multiplanar CT image reconstructions were also generated. RADIATION DOSE REDUCTION: This exam was performed according to the departmental dose-optimization program which includes automated exposure control, adjustment of the mA and/or kV according to patient size and/or use of iterative reconstruction technique. COMPARISON:  None Available. FINDINGS: Alignment: Mild reversal the normal cervical lordosis. 3 mm of grade 1 degenerative anterolisthesis at C7-T1. Skull base and vertebrae: Anterior spurring at the C1-2 articulation. Bilateral degenerative sternoclavicular arthropathy. No cervical spine fracture or acute bony findings identified. Soft tissues and spinal canal: Right common carotid atheromatous vascular calcification. Disc levels: C2-3: Prominent left foraminal stenosis due to uncinate and facet spurring. C3-4: Prominent bilateral foraminal stenosis and moderate central stenosis due to calcified central disc protrusion, uncinate spurring, and short pedicles. C4-5: Prominent bilateral foraminal stenosis and moderate central stenosis due to disc bulge, uncinate spurring, and short pedicles. Possible shallow left paracentral disc protrusion. C5-6: Moderate to  prominent bilateral foraminal stenosis due to uncinate and facet spurring and short pedicles. C6-7: Moderate to prominent left and moderate right foraminal stenosis with borderline central stenosis due to disc osteophyte complex and uncinate spurring. C7-T1: Moderate bilateral foraminal stenosis due to disc uncovering and facet arthropathy. Upper chest: Mild biapical pleuroparenchymal scarring. Other: No supplemental non-categorized findings. IMPRESSION: 1. No acute cervical spine findings. 2. Substantial multilevel impingement due to cervical spondylosis, degenerative disc disease, and short pedicles. 3. Bilateral degenerative sternoclavicular arthropathy. 4. Right  common carotid atheromatous vascular calcification. Electronically Signed   By: Ryan Salvage M.D.   On: 02/24/2024 16:43    Impression/Plan: -Acute blood loss anemia with CT scan showing large mixed density fluid collection around anterior abdominal wall.  Likely gastric wall or perigastric hematoma. - Large volume ascites.  Could be hemoperitoneum - Dark stool.  Patient is complaining of dark brown stool.  Denies black tarry stool.  Recommendations ------------------------- - Check upper GI series to rule out any gastric perforation or ulcer disease. - Not an ideal candidate for endoscopic evaluation as it may make contained perforation worse. - Recommend IV Protonix  40 mg twice a day for now - Transfuse as needed - Keep n.p.o. for now - Continue other supportive care with antibiotics - GI will follow   LOS: 1 day   Layla Lah  MD, FACP 02/25/2024, 8:29 AM  Contact #  [phone]

## 2024-02-25 NOTE — Progress Notes (Signed)
 Subjective: Patient feeling ok this morning, but still having abdominal pain.  He denies nausea.  He hasn't had any further BMs at this time.  Received 2 units of pRBCs overnight.  BP responded to a small 500cc bolus earlier this am as well.  ROS: See above, otherwise other systems negative  Objective: Vital signs in last 24 hours: Temp:  [97.4 F (36.3 C)-99.2 F (37.3 C)] 98.8 F (37.1 C) (10/14 0348) Pulse Rate:  [83-104] 84 (10/14 0800) Resp:  [18-33] 28 (10/14 0800) BP: (85-125)/(42-77) 111/48 (10/14 0800) SpO2:  [88 %-97 %] 93 % (10/14 0800) Weight:  [76.8 kg-79.7 kg] 79.7 kg (10/14 0500) Last BM Date : 02/23/24  Intake/Output from previous day: 10/13 0701 - 10/14 0700 In: 3450 [I.V.:259.4; Blood:636; IV Piggyback:2554.6] Out: -  Intake/Output this shift: Total I/O In: 108.9 [I.V.:85.4; IV Piggyback:23.5] Out: -   PE: Gen: NAD Heart: regular Lungs: CTAB Abd: soft, seems a bit bloated, somewhat diffusely tender, but no peritonitis or rebound  Lab Results:  Recent Labs    02/25/24 0008 02/25/24 0310  WBC 28.3* 28.7*  HGB 7.2* 7.1*  HCT 24.0* 23.6*  PLT 178 174   BMET Recent Labs    02/24/24 1446 02/25/24 0310  NA 134* 138  K 4.3 4.8  CL 102 104  CO2 16* 18*  GLUCOSE 148* 115*  BUN 21 25*  CREATININE 1.72* 2.09*  CALCIUM 8.7* 8.5*   PT/INR Recent Labs    02/24/24 1446 02/25/24 0004  LABPROT 18.1* 18.3*  INR 1.4* 1.4*   CMP     Component Value Date/Time   NA 138 02/25/2024 0310   NA 143 05/17/2023 1216   K 4.8 02/25/2024 0310   CL 104 02/25/2024 0310   CO2 18 (L) 02/25/2024 0310   GLUCOSE 115 (H) 02/25/2024 0310   BUN 25 (H) 02/25/2024 0310   BUN 22 05/17/2023 1216   CREATININE 2.09 (H) 02/25/2024 0310   CREATININE 1.74 (H) 12/31/2023 1601   CALCIUM 8.5 (L) 02/25/2024 0310   PROT 5.4 (L) 02/24/2024 1446   ALBUMIN 3.4 (L) 02/24/2024 1446   AST 28 02/24/2024 1446   AST 19 12/31/2023 1601   ALT 13 02/24/2024 1446   ALT 10  12/31/2023 1601   ALKPHOS 252 (H) 02/24/2024 1446   BILITOT 1.1 02/24/2024 1446   BILITOT 1.4 (H) 12/31/2023 1601   GFRNONAA 31 (L) 02/25/2024 0310   GFRNONAA 39 (L) 12/31/2023 1601   GFRAA 59 (L) 10/03/2017 0352   Lipase  No results found for: LIPASE     Studies/Results: CT Head Wo Contrast Result Date: 02/24/2024 CLINICAL DATA:  Fall, blunt trauma EXAM: CT HEAD WITHOUT CONTRAST TECHNIQUE: Contiguous axial images were obtained from the base of the skull through the vertex without intravenous contrast. RADIATION DOSE REDUCTION: This exam was performed according to the departmental dose-optimization program which includes automated exposure control, adjustment of the mA and/or kV according to patient size and/or use of iterative reconstruction technique. COMPARISON:  None Available. FINDINGS: Brain: No acute intracranial abnormality. Specifically, no hemorrhage, hydrocephalus, mass lesion, acute infarction, or significant intracranial injury. Vascular: No hyperdense vessel or unexpected calcification. Skull: No acute calvarial abnormality. Sinuses/Orbits: No acute findings Other: None IMPRESSION: No acute intracranial abnormality. Electronically Signed   By: Franky Crease M.D.   On: 02/24/2024 17:12   CT ABDOMEN PELVIS W CONTRAST Result Date: 02/24/2024 CLINICAL DATA:  Nausea, vomiting, distended abdomen.  Recent fall. EXAM: CT ABDOMEN AND PELVIS WITH  CONTRAST TECHNIQUE: Multidetector CT imaging of the abdomen and pelvis was performed using the standard protocol following bolus administration of intravenous contrast. RADIATION DOSE REDUCTION: This exam was performed according to the departmental dose-optimization program which includes automated exposure control, adjustment of the mA and/or kV according to patient size and/or use of iterative reconstruction technique. CONTRAST:  OMNIPAQUE  IOHEXOL  300 MG/ML  SOLN COMPARISON:  03/27/2018 FINDINGS: Lower chest: Airspace disease noted in both  lower lungs, most pronounced in the left lower lobe concerning for pneumonia. No effusions. Cardiomegaly. Coronary artery disease. Hepatobiliary: Heterogeneous appearance of the liver which appears enlarged. No focal hepatic abnormality. Small gallstone within the gallbladder which is otherwise unremarkable. Pancreas: No focal abnormality or ductal dilatation. Spleen: Spleen is enlarged with a craniocaudal length of 13.5 cm. No focal abnormality. Adrenals/Urinary Tract: Bilateral renal cysts appear benign. No follow-up imaging recommended. No suspicious renal or adrenal abnormality. No hydronephrosis. Urinary bladder unremarkable. Stomach/Bowel: Stomach, large and small bowel grossly unremarkable. Vascular/Lymphatic: Aortic atherosclerosis. No evidence of aneurysm or adenopathy. Reproductive: Obscured by beam hardening artifact from left hip replacement. Other: Large volume ascites in the abdomen and pelvis. There appears to be a mixed density fluid collection noted along the anterior gastric wall which could reflect a gastric wall or perigastric hematoma measuring 11.1 x 3.5 cm. Musculoskeletal: No acute bony abnormality. IMPRESSION: Heterogeneous enhancement throughout the liver with hepatomegaly. Splenomegaly. Large volume ascites throughout the abdomen and pelvis. There appears to be a complex fluid collection with mass effect anterior to the stomach concerning for possible gastric wall or perigastric hematoma. Patchy airspace opacities in the lower lungs bilaterally, left greater than right concerning for pneumonia. Coronary artery disease, aortic atherosclerosis. Electronically Signed   By: Franky Crease M.D.   On: 02/24/2024 17:11   CT Cervical Spine Wo Contrast Result Date: 02/24/2024 CLINICAL DATA:  Fall, blunt trauma EXAM: CT CERVICAL SPINE WITHOUT CONTRAST TECHNIQUE: Multidetector CT imaging of the cervical spine was performed without intravenous contrast. Multiplanar CT image reconstructions were also  generated. RADIATION DOSE REDUCTION: This exam was performed according to the departmental dose-optimization program which includes automated exposure control, adjustment of the mA and/or kV according to patient size and/or use of iterative reconstruction technique. COMPARISON:  None Available. FINDINGS: Alignment: Mild reversal the normal cervical lordosis. 3 mm of grade 1 degenerative anterolisthesis at C7-T1. Skull base and vertebrae: Anterior spurring at the C1-2 articulation. Bilateral degenerative sternoclavicular arthropathy. No cervical spine fracture or acute bony findings identified. Soft tissues and spinal canal: Right common carotid atheromatous vascular calcification. Disc levels: C2-3: Prominent left foraminal stenosis due to uncinate and facet spurring. C3-4: Prominent bilateral foraminal stenosis and moderate central stenosis due to calcified central disc protrusion, uncinate spurring, and short pedicles. C4-5: Prominent bilateral foraminal stenosis and moderate central stenosis due to disc bulge, uncinate spurring, and short pedicles. Possible shallow left paracentral disc protrusion. C5-6: Moderate to prominent bilateral foraminal stenosis due to uncinate and facet spurring and short pedicles. C6-7: Moderate to prominent left and moderate right foraminal stenosis with borderline central stenosis due to disc osteophyte complex and uncinate spurring. C7-T1: Moderate bilateral foraminal stenosis due to disc uncovering and facet arthropathy. Upper chest: Mild biapical pleuroparenchymal scarring. Other: No supplemental non-categorized findings. IMPRESSION: 1. No acute cervical spine findings. 2. Substantial multilevel impingement due to cervical spondylosis, degenerative disc disease, and short pedicles. 3. Bilateral degenerative sternoclavicular arthropathy. 4. Right common carotid atheromatous vascular calcification. Electronically Signed   By: Ryan Salvage M.D.   On: 02/24/2024  16:43     Anti-infectives: Anti-infectives (From admission, onward)    Start     Dose/Rate Route Frequency Ordered Stop   02/24/24 2200  piperacillin-tazobactam (ZOSYN) IVPB 3.375 g        3.375 g 12.5 mL/hr over 240 Minutes Intravenous Every 8 hours 02/24/24 1859     02/24/24 1500  piperacillin-tazobactam (ZOSYN) IVPB 3.375 g        3.375 g 100 mL/hr over 30 Minutes Intravenous  Once 02/24/24 1456 02/24/24 1533        Assessment/Plan Hemoperitoneum vs contained perforation, melena -on abx therapy, Zosyn.  WBC 28K today,  but baseline appears to be in the 20s from his CLL/MDS - hgb has stabilized around 7.  Normotensive right now with a normal HR -patient has been having black stools at home.  Given anemia and this finding, likely needs GI evaluation -could go UGI to eval for possible contained perforation, but would defer to GI evaluation for need for this vs EGD to eval cause of melena. -no acute surgical needs currently -we will follow with you   FEN - NPO VTE - SCDs ID - Zosyn  CLL MDS Anemia HTN CKD dHF  I reviewed nursing notes, hospitalist notes, last 24 h vitals and pain scores, last 48 h intake and output, last 24 h labs and trends, and last 24 h imaging results.   LOS: 1 day    Burnard FORBES Banter , [facility] 02/25/2024, 8:10 AM Please see Amion for pager number during day hours 7:00am-4:30pm or 7:00am -11:30am on weekends

## 2024-02-25 NOTE — Progress Notes (Addendum)
 eLink Physician-Brief Progress Note Patient Name: Jared [name] DOB: 14-Feb-1946 MRN: [id number]   Date of Service  02/25/2024  HPI/Events of Note  Notified of hypotension with MAP 59, BP 87/47.  Pt just received 2 units pRBCs.   eICU Interventions  Give 500cc LR bolus. If he remains hypotensive, will start on levophed peripherally.  Follow up post transfusion CBC.      Intervention Category Intermediate Interventions: Hypotension - evaluation and management  Shanda Busman 02/25/2024, 12:29 AM  1:49 AM Hgb improved to 7.2 <-- 5.6. Platelets 178. Lactic acid 1.9 <-- 5.7.   Pt however with soft blood pressure after IVF bolus and levophed has been started.  Plan> Recheck CBC at 0300 which is the post-transfusion CBC.  Will hold off on CTA unless levophed requirements continue to go up as the patient responded to blood transfusion.  Pt also with CKD so I am wary about doing CTA now.   5:49 AM Hgb has stabilized, now at 7,1 <-- 7.2.  Levophed requirements are down, now only on 1 mcg/min.  Pt has elevated d dimer but normal fibrinogen, PTT also only marginally prolonged, normal platelet count, does not fit DIC picture.  Plan> Hold off on CTA.  Continue current management.

## 2024-02-26 ENCOUNTER — Inpatient Hospital Stay (HOSPITAL_COMMUNITY)

## 2024-02-26 DIAGNOSIS — R578 Other shock: Secondary | ICD-10-CM | POA: Diagnosis not present

## 2024-02-26 LAB — COMPREHENSIVE METABOLIC PANEL WITH GFR
ALT: 25 U/L (ref 0–44)
AST: 46 U/L — ABNORMAL HIGH (ref 15–41)
Albumin: 3.4 g/dL — ABNORMAL LOW (ref 3.5–5.0)
Alkaline Phosphatase: 227 U/L — ABNORMAL HIGH (ref 38–126)
Anion gap: 16 — ABNORMAL HIGH (ref 5–15)
BUN: 31 mg/dL — ABNORMAL HIGH (ref 8–23)
CO2: 20 mmol/L — ABNORMAL LOW (ref 22–32)
Calcium: 8.4 mg/dL — ABNORMAL LOW (ref 8.9–10.3)
Chloride: 106 mmol/L (ref 98–111)
Creatinine, Ser: 1.92 mg/dL — ABNORMAL HIGH (ref 0.61–1.24)
GFR, Estimated: 35 mL/min — ABNORMAL LOW (ref >60)
Glucose, Bld: 113 mg/dL — ABNORMAL HIGH (ref 70–99)
Potassium: 4.5 mmol/L (ref 3.5–5.1)
Sodium: 142 mmol/L (ref 135–145)
Total Bilirubin: 1.3 mg/dL — ABNORMAL HIGH (ref 0.0–1.2)
Total Protein: 5.5 g/dL — ABNORMAL LOW (ref 6.5–8.1)

## 2024-02-26 LAB — HEMOGLOBIN AND HEMATOCRIT, BLOOD
HCT: 21.7 % — ABNORMAL LOW (ref 39.0–52.0)
HCT: 25.5 % — ABNORMAL LOW (ref 39.0–52.0)
Hemoglobin: 6.4 g/dL — CL (ref 13.0–17.0)
Hemoglobin: 7.6 g/dL — ABNORMAL LOW (ref 13.0–17.0)

## 2024-02-26 LAB — C DIFFICILE QUICK SCREEN W PCR REFLEX
C Diff antigen: POSITIVE — AB
C Diff toxin: NEGATIVE

## 2024-02-26 LAB — PREPARE RBC (CROSSMATCH)

## 2024-02-26 LAB — CBC
HCT: 23 % — ABNORMAL LOW (ref 39.0–52.0)
Hemoglobin: 6.9 g/dL — CL (ref 13.0–17.0)
MCH: 29.6 pg (ref 26.0–34.0)
MCHC: 30 g/dL (ref 30.0–36.0)
MCV: 98.7 fL (ref 80.0–100.0)
Platelets: 207 K/uL (ref 150–400)
RBC: 2.33 MIL/uL — ABNORMAL LOW (ref 4.22–5.81)
RDW: 26.4 % — ABNORMAL HIGH (ref 11.5–15.5)
WBC: 46.6 K/uL — ABNORMAL HIGH (ref 4.0–10.5)
nRBC: 3 % — ABNORMAL HIGH (ref 0.0–0.2)

## 2024-02-26 MED ORDER — SODIUM CHLORIDE 0.9 % IV SOLN: INTRAVENOUS | Status: DC

## 2024-02-26 MED ORDER — SODIUM CHLORIDE 0.9% IV SOLUTION: Freq: Once | INTRAVENOUS | Status: AC

## 2024-02-26 MED ORDER — SODIUM CHLORIDE 0.9% IV SOLUTION: Freq: Once | INTRAVENOUS | Status: DC

## 2024-02-26 MED ORDER — PANTOPRAZOLE SODIUM 40 MG IV SOLR
40.0000 mg | Freq: Three times a day (TID) | INTRAVENOUS | Status: DC
  Administered 2024-02-26 – 2024-03-02 (×14): 40 mg via INTRAVENOUS
  Filled 2024-02-26 (×14): qty 10

## 2024-02-26 NOTE — Progress Notes (Signed)
 eLink Physician-Brief Progress Note Patient Name: Jared [name] DOB: December 29, 1945 MRN: [id number]   Date of Service  02/26/2024  HPI/Events of Note  Hemoglobin 6.9 gm / dl.  eICU Interventions  One unit PRBC ordered transfused.        Sophea Rackham U Kairee Isa 02/26/2024, 5:15 AM

## 2024-02-26 NOTE — Progress Notes (Signed)
 PROGRESS NOTE Jared [name]  FMW:[id number] DOB: 12-13-1945 DOA: 02/24/2024 PCP: [name], FNP  Brief Narrative/Hospital Course: Jared [name] is a 78 y.o. male with PMH of CLL MDS HTN CKD 3a diastolic HF who presented to ED 02/24/24 w/ complain of weakness/lethargy, and patient also having few weeks of unintentional weight loss loss of appetite and abdominal pain. Patient was hypotensive prehospital and given small IVF bolus. In ED anemic hgb 5.6, + fecal occult, LA 5.6 and SBP low 90s. CT a/p with contrast shows a possible gastric wall / perigastric hematoma or other complex fluid collection. Also BLL (L>R) ASD,ascites, splenomegaly and hepatomegaly. GI, general surgery consulted and admitted to ICU.  Transfused 2 units of PRBC on admission  Subjective: Seen and examined today Overnight vitals stable, afebrile.  Labs reviewed creatinine slightly better 1.9, mild transaminitis, CBC with leukocytosis 46 hemoglobin 6.9> 6.4-1 unit PRBC  just being started. Still having black loose runny stool Mild soreness on the lower abdomen, wife at the bedside no new complaints  Assessment and plan:  Acute blood loss anemia/hemorrhagic shock Large mixed density fluid collection around anterior abdominal wall-likely gastric wall or perigastric hematoma Large volume ascites-could be hemoperitoneum: GI and surgery following/Upper GI series did not show evidence of perforation. Continue PPI twice daily, transfuse 2 unit PRBC today Continue n.p.o., supportive care IV hydration antibiotics-Zosyn, also on octreotide drip. Consider CTA GI study if hemodynamically stable-discussed with GI this morning, changing to PPI infusion, may do EGD tomorrow. IR paracentesis ordered.  Recent Labs  Lab 02/25/24 0008 02/25/24 0310 02/25/24 1110 02/26/24 0309 02/26/24 0836  HGB 7.2* 7.1* 7.1* 6.9* 6.4*  HCT 24.0* 23.6* 23.4* 23.0* 21.7*    AKI on CKD 3A AGMA: Baseline creatinine around 1.7.  Renal function  improving monitor. Recent Labs    04/17/23 1042 04/23/23 0955 05/17/23 1216 12/31/23 1601 02/24/24 1446 02/25/24 0310 02/26/24 0309  BUN 13  --  22 27* 21 25* 31*  CREATININE 1.17  --  1.43* 1.74* 1.72* 2.09* 1.92*  CO2 24  --  22 27 16* 18* 20*  K 4.2   < > 4.6 4.3 4.3 4.8 4.5   < > = values in this interval not displayed.    Transaminitis Hepatosplenomegaly: GI on board monitor LFTs  [facility] pneumonia Significant leukocytosis Continue Zosyn.  Vitals stable blood culture from admission NGTD  CLL MDS: Chronically elevated WBC count, appears worse on baseline.  Monitor continue antibiotics. F/b Dr Loretha- will ask them to see him. Recent Labs  Lab 02/21/24 0944 02/24/24 1446 02/25/24 0008 02/25/24 0310 02/26/24 0309  WBC 22.2* 29.0* 28.3* 28.7* 46.6*    History of hypertension Bp stable  Nonobstructive CAD Mild PHTN: Monitor.  Goals of care: Currently remains DNR..  DVT prophylaxis: SCDs Start: 02/24/24 1724 Code Status:   Code Status: Do not attempt resuscitation (DNR) PRE-ARREST INTERVENTIONS DESIRED Family Communication: plan of care discussed with patient at bedside. Patient status is: Remains hospitalized because of severity of illness Level of care: Stepdown   Dispo: The patient is from: Home            Anticipated disposition: TBD.  Objective: Vitals last 24 hrs: Vitals:   02/26/24 0700 02/26/24 0800 02/26/24 0931 02/26/24 0946  BP: (!) 125/59  (!) 110/59 (!) 125/59  Pulse:    91  Resp: (!) 25  (!) 27 (!) 28  Temp:  98.3 F (36.8 C) 97.8 F (36.6 C) 98.1 F (36.7 C)  TempSrc:  Oral  Oral Oral  SpO2:   95%   Weight:      Height:        Physical Examination: General exam: alert awake, oriented, older than stated age HEENT:Oral mucosa moist, Ear/Nose WNL grossly Respiratory system: Bilaterally clear BS,no use of accessory muscle Cardiovascular system: S1 & S2 +, No JVD. Gastrointestinal system: Abdomen soft,NT,ND, BS+ Nervous System:  Alert, awake, moving all extremities,and following commands. Extremities: extremities warm, leg edema + r?l Skin: No rashes,no icterus. MSK: Normal muscle bulk,tone, power   Medications reviewed:  Scheduled Meds:  sodium chloride    Intravenous Once   sodium chloride    Intravenous Once   Chlorhexidine  Gluconate Cloth  6 each Topical Daily   pantoprazole  (PROTONIX ) IV  40 mg Intravenous Q12H   Continuous Infusions:  sodium chloride  50 mL/hr at 02/26/24 0912   octreotide (SANDOSTATIN) 500 mcg in sodium chloride  0.9 % 250 mL (2 mcg/mL) infusion 50 mcg/hr (02/26/24 0923)   piperacillin-tazobactam (ZOSYN)  IV 3.375 g (02/26/24 9361)   Diet: Diet Order             Diet NPO time specified Except for: Other (See Comments)  Diet effective now                   Data Reviewed: I have personally reviewed following labs and imaging studies ( see epic result tab) CBC: Recent Labs  Lab 02/21/24 0944 02/24/24 1446 02/25/24 0004 02/25/24 0008 02/25/24 0310 02/25/24 1110 02/26/24 0309 02/26/24 0836  WBC 22.2* 29.0*  --  28.3* 28.7*  --  46.6*  --   NEUTROABS 17.9* 22.1*  --   --   --   --   --   --   HGB 8.0* 5.6*  --  7.2* 7.1* 7.1* 6.9* 6.4*  HCT 26.3* 19.3*  --  24.0* 23.6* 23.4* 23.0* 21.7*  MCV 99.2 106.6*  --  98.8 98.7  --  98.7  --   PLT 166 193 182 178 174  --  207  --    CMP: Recent Labs  Lab 02/24/24 1446 02/25/24 0310 02/26/24 0309  NA 134* 138 142  K 4.3 4.8 4.5  CL 102 104 106  CO2 16* 18* 20*  GLUCOSE 148* 115* 113*  BUN 21 25* 31*  CREATININE 1.72* 2.09* 1.92*  CALCIUM 8.7* 8.5* 8.4*   GFR: Estimated Creatinine Clearance: 32.7 mL/min (A) (by C-G formula based on SCr of 1.92 mg/dL (H)). Recent Labs  Lab 02/24/24 1446 02/26/24 0309  AST 28 46*  ALT 13 25  ALKPHOS 252* 227*  BILITOT 1.1 1.3*  PROT 5.4* 5.5*  ALBUMIN 3.4* 3.4*   No results for input(s): LIPASE, AMYLASE in the last 168 hours. No results for input(s): AMMONIA in the last 168  hours. Coagulation Profile:  Recent Labs  Lab 02/24/24 1446 02/25/24 0004  INR 1.4* 1.4*   Unresulted Labs (From admission, onward)     Start     Ordered   02/26/24 0957  Prepare RBC (crossmatch)  (Blood Administration Adult)  Once,   R       Question Answer Comment  # of Units 1 unit   Transfusion Indications Hemoglobin 8 gm/dL or less and orthopedic or cardiac surgery or pre-existing cardiac condition   Number of Units to Keep Ahead NO units ahead   If emergent release call blood bank Not emergent release      02/26/24 0956   02/26/24 0900  Hemoglobin and hematocrit, blood  Now then  every 6 hours,   R     Question:  Specimen collection method  Answer:  Lab=Lab collect   02/26/24 0735   02/26/24 0735  C Difficile Quick Screen w PCR reflex  (C Difficile quick screen w PCR reflex panel )  Once, for 24 hours,   TIMED       References:    CDiff Information Tool   02/26/24 0734   02/24/24 1426  Culture, blood (Routine x 2)  BLOOD CULTURE X 2,   R      02/24/24 1425           Antimicrobials/Microbiology: Anti-infectives (From admission, onward)    Start     Dose/Rate Route Frequency Ordered Stop   02/24/24 2200  piperacillin-tazobactam (ZOSYN) IVPB 3.375 g        3.375 g 12.5 mL/hr over 240 Minutes Intravenous Every 8 hours 02/24/24 1859     02/24/24 1500  piperacillin-tazobactam (ZOSYN) IVPB 3.375 g        3.375 g 100 mL/hr over 30 Minutes Intravenous  Once 02/24/24 1456 02/24/24 1533         Component Value Date/Time   SDES  02/24/2024 1446    BLOOD LEFT FOREARM Performed at [facility], [address], [place], [place] [address]    SPECREQUEST  02/24/2024 1446    BOTTLES DRAWN AEROBIC AND ANAEROBIC Blood Culture adequate volume Performed at [facility], [address], [place], [place] [address]    CULT  02/24/2024 1446    NO GROWTH < 24 HOURS Performed at [facility], [address], [place], [place] [address]    REPTSTATUS  PENDING 02/24/2024 1446    Procedures:   Mennie LAMY, MD [facility] 02/26/2024, 9:59 AM

## 2024-02-26 NOTE — Plan of Care (Signed)

## 2024-02-26 NOTE — Progress Notes (Signed)
 [facility] Progress Note  Jared [name] 78 y.o. 11/05/1945  CC: Anemia, gastric hematoma   Subjective: Patient seen and examined at bedside.  Denies any change in color of the stool.  He stools have been dark since he was started on iron supplements by hematology.  Denies any bright red blood per rectum.  ROS : Afebrile, negative for chest pain   Objective: Vital signs in last 24 hours: Vitals:   02/26/24 1200 02/26/24 1223  BP: 128/66   Pulse:    Resp: (!) 27   Temp:  98.1 F (36.7 C)  SpO2:      Physical Exam: Elderly patient, resting comfortably.  Not in acute distress.  Mild abdominal discomfort on palpation , bowel sound present, no peritoneal signs.  Mood and affect normal.  Lab Results: Recent Labs    02/25/24 0310 02/26/24 0309  NA 138 142  K 4.8 4.5  CL 104 106  CO2 18* 20*  GLUCOSE 115* 113*  BUN 25* 31*  CREATININE 2.09* 1.92*  CALCIUM 8.5* 8.4*   Recent Labs    02/24/24 1446 02/26/24 0309  AST 28 46*  ALT 13 25  ALKPHOS 252* 227*  BILITOT 1.1 1.3*  PROT 5.4* 5.5*  ALBUMIN 3.4* 3.4*   Recent Labs    02/24/24 1446 02/25/24 0004 02/25/24 0310 02/25/24 1110 02/26/24 0309 02/26/24 0836  WBC 29.0*   < > 28.7*  --  46.6*  --   NEUTROABS 22.1*  --   --   --   --   --   HGB 5.6*   < > 7.1*   < > 6.9* 6.4*  HCT 19.3*   < > 23.6*   < > 23.0* 21.7*  MCV 106.6*   < > 98.7  --  98.7  --   PLT 193   < > 174  --  207  --    < > = values in this interval not displayed.   Recent Labs    02/24/24 1446 02/25/24 0004  LABPROT 18.1* 18.3*  INR 1.4* 1.4*      Assessment/Plan: -Acute blood loss anemia with CT scan showing large mixed density fluid collection around anterior abdominal wall.  Likely gastric wall or perigastric hematoma. - Large volume ascites.  Could be hemoperitoneum - Dark stool.  Patient is complaining of dark brown stool.  Denies black tarry stool.   Recommendations ------------------------- - Upper GI series  negative for any perforation.  Patient with ongoing drop in hemoglobin.  Hemoglobin 6.4 this morning.  Recommend to minutes of blood transfusion.  Tentative plan for EGD tomorrow.  Okay to have clear liquids from GI standpoint.  Change Protonix  to IV 40 mg every 8 hours.  Keep n.p.o. past midnight.  Risks (bleeding, infection, bowel perforation that could require surgery, sedation-related changes in cardiopulmonary systems), benefits (identification and possible treatment of source of symptoms, exclusion of certain causes of symptoms), and alternatives (watchful waiting, radiographic imaging studies, empiric medical treatment)  were explained to patient/family in detail and patient wishes to proceed.    Layla Lah MD, FACP 02/26/2024, 12:37 PM  Contact #  [phone]

## 2024-02-26 NOTE — H&P (View-Only) (Signed)
 [facility] Progress Note  Jared [name] 78 y.o. 11/05/1945  CC: Anemia, gastric hematoma   Subjective: Patient seen and examined at bedside.  Denies any change in color of the stool.  He stools have been dark since he was started on iron supplements by hematology.  Denies any bright red blood per rectum.  ROS : Afebrile, negative for chest pain   Objective: Vital signs in last 24 hours: Vitals:   02/26/24 1200 02/26/24 1223  BP: 128/66   Pulse:    Resp: (!) 27   Temp:  98.1 F (36.7 C)  SpO2:      Physical Exam: Elderly patient, resting comfortably.  Not in acute distress.  Mild abdominal discomfort on palpation , bowel sound present, no peritoneal signs.  Mood and affect normal.  Lab Results: Recent Labs    02/25/24 0310 02/26/24 0309  NA 138 142  K 4.8 4.5  CL 104 106  CO2 18* 20*  GLUCOSE 115* 113*  BUN 25* 31*  CREATININE 2.09* 1.92*  CALCIUM 8.5* 8.4*   Recent Labs    02/24/24 1446 02/26/24 0309  AST 28 46*  ALT 13 25  ALKPHOS 252* 227*  BILITOT 1.1 1.3*  PROT 5.4* 5.5*  ALBUMIN 3.4* 3.4*   Recent Labs    02/24/24 1446 02/25/24 0004 02/25/24 0310 02/25/24 1110 02/26/24 0309 02/26/24 0836  WBC 29.0*   < > 28.7*  --  46.6*  --   NEUTROABS 22.1*  --   --   --   --   --   HGB 5.6*   < > 7.1*   < > 6.9* 6.4*  HCT 19.3*   < > 23.6*   < > 23.0* 21.7*  MCV 106.6*   < > 98.7  --  98.7  --   PLT 193   < > 174  --  207  --    < > = values in this interval not displayed.   Recent Labs    02/24/24 1446 02/25/24 0004  LABPROT 18.1* 18.3*  INR 1.4* 1.4*      Assessment/Plan: -Acute blood loss anemia with CT scan showing large mixed density fluid collection around anterior abdominal wall.  Likely gastric wall or perigastric hematoma. - Large volume ascites.  Could be hemoperitoneum - Dark stool.  Patient is complaining of dark brown stool.  Denies black tarry stool.   Recommendations ------------------------- - Upper GI series  negative for any perforation.  Patient with ongoing drop in hemoglobin.  Hemoglobin 6.4 this morning.  Recommend to minutes of blood transfusion.  Tentative plan for EGD tomorrow.  Okay to have clear liquids from GI standpoint.  Change Protonix  to IV 40 mg every 8 hours.  Keep n.p.o. past midnight.  Risks (bleeding, infection, bowel perforation that could require surgery, sedation-related changes in cardiopulmonary systems), benefits (identification and possible treatment of source of symptoms, exclusion of certain causes of symptoms), and alternatives (watchful waiting, radiographic imaging studies, empiric medical treatment)  were explained to patient/family in detail and patient wishes to proceed.    Layla Lah MD, FACP 02/26/2024, 12:37 PM  Contact #  [phone]

## 2024-02-26 NOTE — Progress Notes (Signed)
   02/26/24 0946  TOC Brief Assessment  Insurance and Status Reviewed  Patient has primary care physician Yes  Home environment has been reviewed Single family home with spouse  Prior level of function: Self-care  Prior/Current Home Services No current home services  Social Drivers of Health Review SDOH reviewed no interventions necessary  Readmission risk has been reviewed Yes  Transition of care needs transition of care needs identified, TOC will continue to follow

## 2024-02-26 NOTE — Hospital Course (Addendum)
 Jared [name] is a 78 y.o. male with PMH of CLL MDS HTN CKD 3a diastolic HF who presented to ED 02/24/24 w/ complain of weakness/lethargy, and patient also having few weeks of unintentional weight loss loss of appetite and abdominal pain. Patient was hypotensive prehospital and given small IVF bolus. In ED anemic hgb 5.6, + fecal occult, LA 5.6 and SBP low 90s. CT a/p with contrast shows a possible gastric wall / perigastric hematoma or other complex fluid collection. Also BLL (L>R) ASD,ascites, splenomegaly and hepatomegaly. GI, general surgery consulted and admitted to ICU.  Transfused 2 units of PRBC on admission and again 2 u on 10/15 S/p EGD 10/16 >showed severe hemorrhagic gastritis Hemoglobin stabilized since, tolerating diet transfer out of ICU 10/18 10/21: Creatinine is slowly worsening, nephrology consulted, renal ultrasound obtained no hydronephrosis-received IV albumin and Lasix  x 1  Subjective: Seen and examined Abdomen bloating is not worse remains stable, reports she has been passing gas, no nausea vomiting abdominal pain Overnight hemodynamically stable afebrile on room air Labs this morning shows creatinine at 2.7 trending up WBC down further 15.4 hemoglobin 8 Last BM 10/20-it was loose and large Urine output 1300 cc-following 40 of IV Lasix  and albumin 25 g  Assessment and plan:  ABLA W/ Hemorrhagic shock Large mixed density fluid collection around anterior abdominal wall-likely gastric wall or perigastric hematoma Large volume ascites Severe hemorrhagic gastritis per EGD Loose Dark stool: GI and surgery following/Upper GI series did not show evidence of perforation, ultrasound paracentesis 10/16: s/p 1.2 L peritoneal fluid  S/p 4 u prbc so far and hb has been stable or 8 g Stool- positive for antigen but C. difficile PCR negative  S/p EGD 10/16 >showed severe hemorrhagic gastritis in the gastric body of the stomach Per GI If persistent dropin hb will need IR consult for  arteriogram to rule out any source of retroperitoneal bleed but HB is stable since. Continue PPI 40 mg bid-on d/c BID x 8 wk followed  daily by 4 wk. Hospitalization complicated with bloating abdominal distention>CT abd 10/19-no bowel obstruction, cholelithiasis and sigmoid diverticulosis, small bilateral pleural effusion with large areas of consolidative changes lower lobes bilaterally, diffuse mesenteric edema, anasarca worsened since prior CT, small ascites  Having bowel movement, encouraging PT OT and diet Recent Labs  Lab 02/29/24 0255 03/01/24 0343 03/02/24 0337 03/03/24 0417 03/04/24 0431  HGB 8.3* 8.1* 8.2* 8.3* 8.0*  HCT 27.2* 27.1* 27.4* 26.8* 26.3*   AKI on CKD 3A AGMA: B/L Creat ~1.7.Peaked 2.0 creatinine has been trending up despite IV fluids.likely multifactorial w/ anasarca, hypoalbuminemia Nephrology consulted-10/21, renal ultrasound obtained no hydronephrosis. Received dose of Lasix  and albumin 10/21 No obvious hypotension and no use of nephrotoxic medication, was on Zosyn has been discontinued on completion.  Cont to monitor I/o ,renal fun as below , overall net positive balance :Net IO Since Admission: 5,825.57 mL [03/04/24 0905] Intake/Output Summary (Last 24 hours) at 03/04/2024 0905 Last data filed at 03/04/2024 0650 Gross per 24 hour  Intake 445.5 ml  Output 1300 ml  Net -854.5 ml    Recent Labs    02/24/24 1446 02/25/24 0310 02/26/24 0309 02/27/24 0805 02/28/24 0315 02/29/24 0255 03/01/24 0343 03/02/24 0337 03/03/24 0417 03/04/24 0431  BUN 21 25* 31* 22 20 20  24* 31* 37* 37*  CREATININE 1.72* 2.09* 1.92* 1.58* 1.38* 1.34* 1.42* 1.95* 2.43* 2.75*  CO2 16* 18* 20* 20* 20* 22 20* 20* 20* 20*  K 4.3 4.8 4.5 3.9 3.9 3.7 3.6 3.6  3.6 3.3*    Transaminitis-mild Hepatosplenomegaly: stable LFTs  CAP Significant leukocytosis Imaging showed  bilateral pleural effusion with large area of consolidation-Blood CX- NGTD.Compelted zosyn day 9 on  10/21  CLL-with chronic leukocytosis not on treatment and on observation only MDS-recent diagnosis: Chronically elevated WBC count, peaked to 46k, now down in 17k.Oncology aware. Followed by Dr. 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   Nonobstructive CAD Mild PHTN: Cont statin.Aspirin , Imdur  remains on hold  Goals of care: Currently remains DNR.  Continue full supportive care  DVT prophylaxis: Place and maintain sequential compression device Start: 02/29/24 1126 SCDs Start: 02/24/24 1724 Code Status:   Code Status: Do not attempt resuscitation (DNR) PRE-ARREST INTERVENTIONS DESIRED Family Communication: plan of care discussed with patient and his wife at bedside 10/21, wife not at bedside today. Patient status is: Remains hospitalized because of severity of illness Level of care: Telemetry   Dispo: The patient is from: Home            Anticipated disposition: TBD  Objective: Vitals last 24 hrs: Vitals:   03/03/24 1258 03/03/24 2146 03/04/24 0500 03/04/24 0514  BP: (!) 108/50 (!) 110/57  112/63  Pulse: 75 81  78  Resp: 16 18  16   Temp: 97.9 F (36.6 C) 97.9 F (36.6 C)  98.3 F (36.8 C)  TempSrc: Oral   Oral  SpO2: 94% 93%  100%  Weight:   80 kg   Height:        Physical Examination: General exam: AAOX3 ill looking not in distress, comfortable HEENT:Oral mucosa moist, Ear/Nose WNL grossly Respiratory system: Lungs bilaterally clear to auscultation Cardiovascular system: S1 & S2 +, No JVD. Gastrointestinal system: Abdomen soft, moderately distended,nontender, BS+ Nervous System: Alert,moving all extremities,and following commands. Extremities: extremities  warm, leg edema neg Skin: No rashes,no icterus. MSK: Normal muscle bulk,tone, power   Medications reviewed:  Scheduled Meds:  atorvastatin  10 mg Oral Daily   brimonidine   1 drop Left Eye BID   dorzolamide -timolol   1 drop Left Eye BID   Influenza vac split trivalent PF  0.5 mL Intramuscular Tomorrow-1000   latanoprost   1 drop Left Eye QHS   pantoprazole  (PROTONIX ) IV  40 mg Intravenous Q12H   tamsulosin   0.4 mg Oral QHS   Continuous Infusions:   Diet: Diet Order             DIET SOFT Room service appropriate? Yes; Fluid consistency: Thin  Diet effective now

## 2024-02-26 NOTE — Progress Notes (Addendum)
 Jared Jared [name]   DOB:12-27-1945   FM#:[id number]      ASSESSMENT & PLAN:  Jared Jared [name] is an 78 year old male Jared [name] with oncologic history significant for MDS/CLL.  Jared [name] admitted 02/24/2024 after fall at home and found to be very anemic.  Medical oncology/Dr. Loretha following.  MDS - Recent diagnosis. Due to Jared [name]'s age and clinical status bone marrow transplant not feasible. - Jared [name] on weekly ESA. - Has been followed closely by medical oncology.  Last seen by Dr. Loretha 02/14/2024 as outpatient.  Severe anemia Acute blood loss anemia -Hemoglobin 5.6 in the ED.  Status post PRBC transfusions with some improvement. - Hemoglobin 6.9 earlier this a.m.  Seen with PRBC transfusing. - Recommend PRBC transfusion for hemoglobin <7.0 - GI and surgery following.  For possible EGD. 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  Higher than baseline. - Not on oncologic treatment.  Observation only - Continue follow-up as oncology outpatient  Status post fall/generalized weakness - Falls precautions - Supportive care  Renal dysfunction - Elevated creatinine and BUN, decreased GFR - Avoid nephrotoxic agents - Continue to monitor renal function    Code Status DNR-INT  Subjective:  Jared [name] seen awake and alert laying in bed in stepdown unit.  Wife at bedside.  Jared [name]'s wife reports that Jared [name] fell at home forward hitting his stomach.  Denies hitting his head.  Jared [name] admits to abdominal pain.  Denies other acute symptoms.  PRBC transfusing well at time of this visit.  Objective:   Intake/Output Summary (Last 24 hours) at 02/26/2024 1049 Last data filed at 02/26/2024 0631 Gross per 24 hour  Intake 717.82 ml  Output 700 ml  Net 17.82 ml     PHYSICAL EXAMINATION: ECOG PERFORMANCE STATUS: 4 - Bedbound  Vitals:   02/26/24 0931 02/26/24 0946  BP: (!) 110/59 (!) 125/59  Pulse:  91  Resp: (!) 27 (!) 28  Temp: 97.8 F  (36.6 C) 98.1 F (36.7 C)  SpO2: 95%    Filed Weights   02/24/24 1839 02/25/24 0500 02/26/24 0500  Weight: 169 lb 5 oz (76.8 kg) 175 lb 11.3 oz (79.7 kg) 175 lb 4.3 oz (79.5 kg)    GENERAL: alert, no distress and comfortable +chronically ill appearing SKIN: skin color, texture, turgor are normal, no rashes or significant lesions EYES: normal, conjunctiva are pink and non-injected, sclera clear OROPHARYNX: no exudate, no erythema and lips, buccal mucosa, and tongue normal  NECK: supple, thyroid  normal size, non-tender, without nodularity LYMPH: no palpable lymphadenopathy in the cervical, axillary or inguinal LUNGS: clear to auscultation and percussion with normal breathing effort HEART: regular rate & rhythm and no murmurs and no lower extremity edema ABDOMEN: abdomen soft, non-tender and normal bowel sounds MUSCULOSKELETAL: no cyanosis of digits and no clubbing  PSYCH: alert & oriented x 3 with fluent speech NEURO: no focal motor/sensory deficits   All questions were answered. The Jared [name] knows to call the clinic with any problems, questions or concerns.   The total time spent in the appointment was 30 minutes encounter with Jared [name] including review of chart and various tests results, discussions about plan of care and coordination of care plan  [name], NP 02/26/2024 10:49 AM    Labs Reviewed:  Lab Results  Component Value Date   WBC 46.6 (H) 02/26/2024   HGB 6.4 (LL) 02/26/2024   HCT 21.7 (L) 02/26/2024   MCV 98.7 02/26/2024   PLT 207  02/26/2024   Recent Labs    12/31/23 1601 02/24/24 1446 02/25/24 0310 02/26/24 0309  NA 138 134* 138 142  K 4.3 4.3 4.8 4.5  CL 106 102 104 106  CO2 27 16* 18* 20*  GLUCOSE 96 148* 115* 113*  BUN 27* 21 25* 31*  CREATININE 1.74* 1.72* 2.09* 1.92*  CALCIUM 9.6 8.7* 8.5* 8.4*  GFRNONAA 39* 40* 31* 35*  PROT 6.6 5.4*  --  5.5*  ALBUMIN 4.6 3.4*  --  3.4*  AST 19 28  --  46*  ALT 10 13  --  25  ALKPHOS 81 252*  --  227*   BILITOT 1.4* 1.1  --  1.3*    Studies Reviewed:  DG UGI W SINGLE CM (SOL OR THIN BA) Result Date: 02/25/2024 CLINICAL DATA:  Abdominal pain and distension with symptomatic anemia. Gastric wall thickening versus perigastric hematoma on CT. Evaluate for perforation or ulcer. EXAM: WATER SOLUBLE UPPER GI SERIES TECHNIQUE: Single-column upper GI series was performed using water soluble contrast. Radiation Exposure Index (as provided by the fluoroscopic device): 19.8 mGy Kerma CONTRAST:  100 cc Omnipaque  300 orally COMPARISON:  Abdominopelvic CT 02/24/2024 FINDINGS: The scout abdominal radiograph demonstrates a nonobstructive bowel gas pattern. There are degenerative changes throughout the thoracolumbar spine. Left total hip arthroplasty noted. Limited water soluble upper GI series was performed in the semi erect and right lateral decubitus positions. The Jared [name] drank the contrast without difficulty. There is mild esophageal dysmotility without evidence of stricture, mucosal ulceration or significant hiatal hernia. On incomplete gastric filling, no focal mucosal abnormalities are identified. There is no evidence of gastric perforation or gastric outlet obstruction. In the right lateral decubitus position, there is normal emptying of the stomach into the duodenum. The duodenum appears normal. The ligament of Treitz is normally positioned. Spontaneous gastroesophageal reflux to the aortic arch was noted during the examination. IMPRESSION: 1. Limited water-soluble upper GI series demonstrates no evidence of gastric perforation or other bowel leak. No focal mucosal abnormalities are identified. 2. Moderate spontaneous gastroesophageal reflux. 3. Upper endoscopy should be considered for further gastric mucosal assessment. Electronically Signed   By: Elsie Perone M.D.   On: 02/25/2024 15:39   CT Head Wo Contrast Result Date: 02/24/2024 CLINICAL DATA:  Fall, blunt trauma EXAM: CT HEAD WITHOUT CONTRAST TECHNIQUE:  Contiguous axial images were obtained from the base of the skull through the vertex without intravenous contrast. RADIATION DOSE REDUCTION: This exam was performed according to the departmental dose-optimization program which includes automated exposure control, adjustment of the mA and/or kV according to Jared [name] size and/or use of iterative reconstruction technique. COMPARISON:  None Available. FINDINGS: Brain: No acute intracranial abnormality. Specifically, no hemorrhage, hydrocephalus, mass lesion, acute infarction, or significant intracranial injury. Vascular: No hyperdense vessel or unexpected calcification. Skull: No acute calvarial abnormality. Sinuses/Orbits: No acute findings Other: None IMPRESSION: No acute intracranial abnormality. Electronically Signed   By: Franky Crease M.D.   On: 02/24/2024 17:12   CT ABDOMEN PELVIS W CONTRAST Result Date: 02/24/2024 CLINICAL DATA:  Nausea, vomiting, distended abdomen.  Recent fall. EXAM: CT ABDOMEN AND PELVIS WITH CONTRAST TECHNIQUE: Multidetector CT imaging of the abdomen and pelvis was performed using the standard protocol following bolus administration of intravenous contrast. RADIATION DOSE REDUCTION: This exam was performed according to the departmental dose-optimization program which includes automated exposure control, adjustment of the mA and/or kV according to Jared [name] size and/or use of iterative reconstruction technique. CONTRAST:  OMNIPAQUE  IOHEXOL  300 MG/ML  SOLN  COMPARISON:  03/27/2018 FINDINGS: Lower chest: Airspace disease noted in both lower lungs, most pronounced in the left lower lobe concerning for pneumonia. No effusions. Cardiomegaly. Coronary artery disease. Hepatobiliary: Heterogeneous appearance of the liver which appears enlarged. No focal hepatic abnormality. Small gallstone within the gallbladder which is otherwise unremarkable. Pancreas: No focal abnormality or ductal dilatation. Spleen: Spleen is enlarged with a craniocaudal  length of 13.5 cm. No focal abnormality. Adrenals/Urinary Tract: Bilateral renal cysts appear benign. No follow-up imaging recommended. No suspicious renal or adrenal abnormality. No hydronephrosis. Urinary bladder unremarkable. Stomach/Bowel: Stomach, large and small bowel grossly unremarkable. Vascular/Lymphatic: Aortic atherosclerosis. No evidence of aneurysm or adenopathy. Reproductive: Obscured by beam hardening artifact from left hip replacement. Other: Large volume ascites in the abdomen and pelvis. There appears to be a mixed density fluid collection noted along the anterior gastric wall which could reflect a gastric wall or perigastric hematoma measuring 11.1 x 3.5 cm. Musculoskeletal: No acute bony abnormality. IMPRESSION: Heterogeneous enhancement throughout the liver with hepatomegaly. Splenomegaly. Large volume ascites throughout the abdomen and pelvis. There appears to be a complex fluid collection with mass effect anterior to the stomach concerning for possible gastric wall or perigastric hematoma. Patchy airspace opacities in the lower lungs bilaterally, left greater than right concerning for pneumonia. Coronary artery disease, aortic atherosclerosis. Electronically Signed   By: Franky Crease M.D.   On: 02/24/2024 17:11   CT Cervical Spine Wo Contrast Result Date: 02/24/2024 CLINICAL DATA:  Fall, blunt trauma EXAM: CT CERVICAL SPINE WITHOUT CONTRAST TECHNIQUE: Multidetector CT imaging of the cervical spine was performed without intravenous contrast. Multiplanar CT image reconstructions were also generated. RADIATION DOSE REDUCTION: This exam was performed according to the departmental dose-optimization program which includes automated exposure control, adjustment of the mA and/or kV according to Jared [name] size and/or use of iterative reconstruction technique. COMPARISON:  None Available. FINDINGS: Alignment: Mild reversal the normal cervical lordosis. 3 mm of grade 1 degenerative anterolisthesis at  C7-T1. Skull base and vertebrae: Anterior spurring at the C1-2 articulation. Bilateral degenerative sternoclavicular arthropathy. No cervical spine fracture or acute bony findings identified. Soft tissues and spinal canal: Right common carotid atheromatous vascular calcification. Disc levels: C2-3: Prominent left foraminal stenosis due to uncinate and facet spurring. C3-4: Prominent bilateral foraminal stenosis and moderate central stenosis due to calcified central disc protrusion, uncinate spurring, and short pedicles. C4-5: Prominent bilateral foraminal stenosis and moderate central stenosis due to disc bulge, uncinate spurring, and short pedicles. Possible shallow left paracentral disc protrusion. C5-6: Moderate to prominent bilateral foraminal stenosis due to uncinate and facet spurring and short pedicles. C6-7: Moderate to prominent left and moderate right foraminal stenosis with borderline central stenosis due to disc osteophyte complex and uncinate spurring. C7-T1: Moderate bilateral foraminal stenosis due to disc uncovering and facet arthropathy. Upper chest: Mild biapical pleuroparenchymal scarring. Other: No supplemental non-categorized findings. IMPRESSION: 1. No acute cervical spine findings. 2. Substantial multilevel impingement due to cervical spondylosis, degenerative disc disease, and short pedicles. 3. Bilateral degenerative sternoclavicular arthropathy. 4. Right common carotid atheromatous vascular calcification. Electronically Signed   By: Ryan Salvage M.D.   On: 02/24/2024 16:43    Attending Note  I personally saw the Jared [name], reviewed the chart and examined the Jared [name]. The plan of care was discussed with the Jared [name] and the admitting team. I agree with the assessment and plan as documented above. Thank you very much for the consultation. It appears pt had a fall and hurt his abdomen. He was found to have a  large hematoma with ? Hemoperitoneum. We appreciate GI team's  recommendations Agree with transfusion to maintain a Hb of 7 He just started epo weekly for MDS management.  If he remains inpt on Friday, we can attempt to give him second dose of EPO here. We will monitor him closely and see him upon discharge. Daughter was at bedside and requested permission to record our discussion about MDS, once again discussed that he has a high risk MDS and discussed about risk of transfomation to AML and median prognosis and available treatment options. Please call us  with any questions or concerns.

## 2024-02-26 NOTE — Progress Notes (Signed)
 Subjective: Patient feeling ok this morning and feels like his pain is improved.  Had another dark bloody stool earlier this am, but vitals stable and hbg still stable essentially  ROS: See above, otherwise other systems negative  Objective: Vital signs in last 24 hours: Temp:  [98.2 F (36.8 C)-99 F (37.2 C)] 98.2 F (36.8 C) (10/15 0345) Pulse Rate:  [84-88] 85 (10/14 1400) Resp:  [17-30] 25 (10/15 0700) BP: (115-133)/(50-96) 125/59 (10/15 0700) SpO2:  [92 %-95 %] 95 % (10/14 1400) Weight:  [79.5 kg] 79.5 kg (10/15 0500) Last BM Date : 02/25/24  Intake/Output from previous day: 10/14 0701 - 10/15 0700 In: 826.7 [I.V.:701.3; IV Piggyback:125.4] Out: 700 [Urine:700] Intake/Output this shift: No intake/output data recorded.  PE: Gen: NAD Heart: regular Lungs: CTAB Abd: soft, tenderness seems improved some today, but still with some mild diffuse tenderness  Lab Results:  Recent Labs    02/25/24 0310 02/25/24 1110 02/26/24 0309  WBC 28.7*  --  46.6*  HGB 7.1* 7.1* 6.9*  HCT 23.6* 23.4* 23.0*  PLT 174  --  207   BMET Recent Labs    02/25/24 0310 02/26/24 0309  NA 138 142  K 4.8 4.5  CL 104 106  CO2 18* 20*  GLUCOSE 115* 113*  BUN 25* 31*  CREATININE 2.09* 1.92*  CALCIUM 8.5* 8.4*   PT/INR Recent Labs    02/24/24 1446 02/25/24 0004  LABPROT 18.1* 18.3*  INR 1.4* 1.4*   CMP     Component Value Date/Time   NA 142 02/26/2024 0309   NA 143 05/17/2023 1216   K 4.5 02/26/2024 0309   CL 106 02/26/2024 0309   CO2 20 (L) 02/26/2024 0309   GLUCOSE 113 (H) 02/26/2024 0309   BUN 31 (H) 02/26/2024 0309   BUN 22 05/17/2023 1216   CREATININE 1.92 (H) 02/26/2024 0309   CREATININE 1.74 (H) 12/31/2023 1601   CALCIUM 8.4 (L) 02/26/2024 0309   PROT 5.5 (L) 02/26/2024 0309   ALBUMIN 3.4 (L) 02/26/2024 0309   AST 46 (H) 02/26/2024 0309   AST 19 12/31/2023 1601   ALT 25 02/26/2024 0309   ALT 10 12/31/2023 1601   ALKPHOS 227 (H) 02/26/2024 0309    BILITOT 1.3 (H) 02/26/2024 0309   BILITOT 1.4 (H) 12/31/2023 1601   GFRNONAA 35 (L) 02/26/2024 0309   GFRNONAA 39 (L) 12/31/2023 1601   GFRAA 59 (L) 10/03/2017 0352   Lipase  No results found for: LIPASE     Studies/Results: DG UGI W SINGLE CM (SOL OR THIN BA) Result Date: 02/25/2024 CLINICAL DATA:  Abdominal pain and distension with symptomatic anemia. Gastric wall thickening versus perigastric hematoma on CT. Evaluate for perforation or ulcer. EXAM: WATER SOLUBLE UPPER GI SERIES TECHNIQUE: Single-column upper GI series was performed using water soluble contrast. Radiation Exposure Index (as provided by the fluoroscopic device): 19.8 mGy Kerma CONTRAST:  100 cc Omnipaque  300 orally COMPARISON:  Abdominopelvic CT 02/24/2024 FINDINGS: The scout abdominal radiograph demonstrates a nonobstructive bowel gas pattern. There are degenerative changes throughout the thoracolumbar spine. Left total hip arthroplasty noted. Limited water soluble upper GI series was performed in the semi erect and right lateral decubitus positions. The patient drank the contrast without difficulty. There is mild esophageal dysmotility without evidence of stricture, mucosal ulceration or significant hiatal hernia. On incomplete gastric filling, no focal mucosal abnormalities are identified. There is no evidence of gastric perforation or gastric outlet obstruction. In the right lateral decubitus position,  there is normal emptying of the stomach into the duodenum. The duodenum appears normal. The ligament of Treitz is normally positioned. Spontaneous gastroesophageal reflux to the aortic arch was noted during the examination. IMPRESSION: 1. Limited water-soluble upper GI series demonstrates no evidence of gastric perforation or other bowel leak. No focal mucosal abnormalities are identified. 2. Moderate spontaneous gastroesophageal reflux. 3. Upper endoscopy should be considered for further gastric mucosal assessment.  Electronically Signed   By: Elsie Perone M.D.   On: 02/25/2024 15:39   CT Head Wo Contrast Result Date: 02/24/2024 CLINICAL DATA:  Fall, blunt trauma EXAM: CT HEAD WITHOUT CONTRAST TECHNIQUE: Contiguous axial images were obtained from the base of the skull through the vertex without intravenous contrast. RADIATION DOSE REDUCTION: This exam was performed according to the departmental dose-optimization program which includes automated exposure control, adjustment of the mA and/or kV according to patient size and/or use of iterative reconstruction technique. COMPARISON:  None Available. FINDINGS: Brain: No acute intracranial abnormality. Specifically, no hemorrhage, hydrocephalus, mass lesion, acute infarction, or significant intracranial injury. Vascular: No hyperdense vessel or unexpected calcification. Skull: No acute calvarial abnormality. Sinuses/Orbits: No acute findings Other: None IMPRESSION: No acute intracranial abnormality. Electronically Signed   By: Franky Crease M.D.   On: 02/24/2024 17:12   CT ABDOMEN PELVIS W CONTRAST Result Date: 02/24/2024 CLINICAL DATA:  Nausea, vomiting, distended abdomen.  Recent fall. EXAM: CT ABDOMEN AND PELVIS WITH CONTRAST TECHNIQUE: Multidetector CT imaging of the abdomen and pelvis was performed using the standard protocol following bolus administration of intravenous contrast. RADIATION DOSE REDUCTION: This exam was performed according to the departmental dose-optimization program which includes automated exposure control, adjustment of the mA and/or kV according to patient size and/or use of iterative reconstruction technique. CONTRAST:  OMNIPAQUE  IOHEXOL  300 MG/ML  SOLN COMPARISON:  03/27/2018 FINDINGS: Lower chest: Airspace disease noted in both lower lungs, most pronounced in the left lower lobe concerning for pneumonia. No effusions. Cardiomegaly. Coronary artery disease. Hepatobiliary: Heterogeneous appearance of the liver which appears enlarged. No  focal hepatic abnormality. Small gallstone within the gallbladder which is otherwise unremarkable. Pancreas: No focal abnormality or ductal dilatation. Spleen: Spleen is enlarged with a craniocaudal length of 13.5 cm. No focal abnormality. Adrenals/Urinary Tract: Bilateral renal cysts appear benign. No follow-up imaging recommended. No suspicious renal or adrenal abnormality. No hydronephrosis. Urinary bladder unremarkable. Stomach/Bowel: Stomach, large and small bowel grossly unremarkable. Vascular/Lymphatic: Aortic atherosclerosis. No evidence of aneurysm or adenopathy. Reproductive: Obscured by beam hardening artifact from left hip replacement. Other: Large volume ascites in the abdomen and pelvis. There appears to be a mixed density fluid collection noted along the anterior gastric wall which could reflect a gastric wall or perigastric hematoma measuring 11.1 x 3.5 cm. Musculoskeletal: No acute bony abnormality. IMPRESSION: Heterogeneous enhancement throughout the liver with hepatomegaly. Splenomegaly. Large volume ascites throughout the abdomen and pelvis. There appears to be a complex fluid collection with mass effect anterior to the stomach concerning for possible gastric wall or perigastric hematoma. Patchy airspace opacities in the lower lungs bilaterally, left greater than right concerning for pneumonia. Coronary artery disease, aortic atherosclerosis. Electronically Signed   By: Franky Crease M.D.   On: 02/24/2024 17:11   CT Cervical Spine Wo Contrast Result Date: 02/24/2024 CLINICAL DATA:  Fall, blunt trauma EXAM: CT CERVICAL SPINE WITHOUT CONTRAST TECHNIQUE: Multidetector CT imaging of the cervical spine was performed without intravenous contrast. Multiplanar CT image reconstructions were also generated. RADIATION DOSE REDUCTION: This exam was performed according to the  departmental dose-optimization program which includes automated exposure control, adjustment of the mA and/or kV according to  patient size and/or use of iterative reconstruction technique. COMPARISON:  None Available. FINDINGS: Alignment: Mild reversal the normal cervical lordosis. 3 mm of grade 1 degenerative anterolisthesis at C7-T1. Skull base and vertebrae: Anterior spurring at the C1-2 articulation. Bilateral degenerative sternoclavicular arthropathy. No cervical spine fracture or acute bony findings identified. Soft tissues and spinal canal: Right common carotid atheromatous vascular calcification. Disc levels: C2-3: Prominent left foraminal stenosis due to uncinate and facet spurring. C3-4: Prominent bilateral foraminal stenosis and moderate central stenosis due to calcified central disc protrusion, uncinate spurring, and short pedicles. C4-5: Prominent bilateral foraminal stenosis and moderate central stenosis due to disc bulge, uncinate spurring, and short pedicles. Possible shallow left paracentral disc protrusion. C5-6: Moderate to prominent bilateral foraminal stenosis due to uncinate and facet spurring and short pedicles. C6-7: Moderate to prominent left and moderate right foraminal stenosis with borderline central stenosis due to disc osteophyte complex and uncinate spurring. C7-T1: Moderate bilateral foraminal stenosis due to disc uncovering and facet arthropathy. Upper chest: Mild biapical pleuroparenchymal scarring. Other: No supplemental non-categorized findings. IMPRESSION: 1. No acute cervical spine findings. 2. Substantial multilevel impingement due to cervical spondylosis, degenerative disc disease, and short pedicles. 3. Bilateral degenerative sternoclavicular arthropathy. 4. Right common carotid atheromatous vascular calcification. Electronically Signed   By: Ryan Salvage M.D.   On: 02/24/2024 16:43    Anti-infectives: Anti-infectives (From admission, onward)    Start     Dose/Rate Route Frequency Ordered Stop   02/24/24 2200  piperacillin-tazobactam (ZOSYN) IVPB 3.375 g        3.375 g 12.5 mL/hr over  240 Minutes Intravenous Every 8 hours 02/24/24 1859     02/24/24 1500  piperacillin-tazobactam (ZOSYN) IVPB 3.375 g        3.375 g 100 mL/hr over 30 Minutes Intravenous  Once 02/24/24 1456 02/24/24 1533        Assessment/Plan Hemoperitoneum vs contained perforation, melena -on abx therapy, Zosyn.  WBC 46.6K today up from 28K,  baseline appears to be in the 20s from his CLL/MDS - hgb has stabilized around 7.  Normotensive right now with a normal HR.  Hgb 6.9 from 7.1, deferred tx to primary service -patient had 1-2 more dark stools overnight.  May be old blood still passing given stabilization of hgb, vitals, etc -UGI negative for perforation. -will have IR try paracentesis today to remove this fluid as able to help with pain and send for cx given rise in WBC today (still AF).  If this is mostly blood that has clotted, very little will likely be removed.   -no acute surgical needs at this time -we will follow with you   FEN - may try some liquids from our standpoint VTE - SCDs ID - Zosyn  CLL MDS Anemia HTN CKD dHF  I reviewed nursing notes, Consultant GI notes, hospitalist notes, last 24 h vitals and pain scores, last 48 h intake and output, last 24 h labs and trends, and last 24 h imaging results.   LOS: 2 days    Burnard FORBES Banter , [facility] 02/26/2024, 8:23 AM Please see Amion for pager number during day hours 7:00am-4:30pm or 7:00am -11:30am on weekends

## 2024-02-27 ENCOUNTER — Inpatient Hospital Stay (HOSPITAL_COMMUNITY): Admitting: Certified Registered"

## 2024-02-27 ENCOUNTER — Inpatient Hospital Stay (HOSPITAL_COMMUNITY)

## 2024-02-27 ENCOUNTER — Encounter (HOSPITAL_COMMUNITY)
Admission: EM | Disposition: A | Discharge status: Home / Self Care | Payer: Self-pay | Source: Home / Self Care | Attending: Internal Medicine

## 2024-02-27 ENCOUNTER — Encounter (HOSPITAL_COMMUNITY): Payer: Self-pay | Admitting: Internal Medicine

## 2024-02-27 DIAGNOSIS — K922 Gastrointestinal hemorrhage, unspecified: Secondary | ICD-10-CM

## 2024-02-27 DIAGNOSIS — Z87891 Personal history of nicotine dependence: Secondary | ICD-10-CM

## 2024-02-27 DIAGNOSIS — I110 Hypertensive heart disease with heart failure: Secondary | ICD-10-CM

## 2024-02-27 DIAGNOSIS — I5032 Chronic diastolic (congestive) heart failure: Secondary | ICD-10-CM

## 2024-02-27 DIAGNOSIS — R578 Other shock: Secondary | ICD-10-CM | POA: Diagnosis not present

## 2024-02-27 HISTORY — PX: ESOPHAGOGASTRODUODENOSCOPY: SHX5428

## 2024-02-27 LAB — HEMOGLOBIN AND HEMATOCRIT, BLOOD
HCT: 28.3 % — ABNORMAL LOW (ref 39.0–52.0)
HCT: 28.1 % — ABNORMAL LOW (ref 39.0–52.0)
HCT: 29.5 % — ABNORMAL LOW (ref 39.0–52.0)
HCT: 29.2 % — ABNORMAL LOW (ref 39.0–52.0)
Hemoglobin: 8.6 g/dL — ABNORMAL LOW (ref 13.0–17.0)
Hemoglobin: 8.4 g/dL — ABNORMAL LOW (ref 13.0–17.0)
Hemoglobin: 8.7 g/dL — ABNORMAL LOW (ref 13.0–17.0)
Hemoglobin: 8.8 g/dL — ABNORMAL LOW (ref 13.0–17.0)

## 2024-02-27 LAB — BPAM RBC
Blood Product Expiration Date: 202511062359
Blood Product Expiration Date: 202511102359
Blood Product Expiration Date: 202510172359
Blood Product Expiration Date: 202510202359
ISSUE DATE / TIME: 202510150922
ISSUE DATE / TIME: 202510151604
ISSUE DATE / TIME: 202510131617
ISSUE DATE / TIME: 202510131617
Unit Type and Rh: 5100
Unit Type and Rh: 5100
Unit Type and Rh: 9500
Unit Type and Rh: 9500

## 2024-02-27 LAB — CLOSTRIDIUM DIFFICILE BY PCR, REFLEXED
Hypervirulent Strain: NEGATIVE
Toxigenic C. Difficile by PCR: NEGATIVE

## 2024-02-27 LAB — TYPE AND SCREEN
ABO/RH(D): O POS
Antibody Screen: NEGATIVE
Unit division: 0
Unit division: 0
Unit division: 0
Unit division: 0
Unit division: 0

## 2024-02-27 LAB — BASIC METABOLIC PANEL WITH GFR
Anion gap: 14 (ref 5–15)
BUN: 22 mg/dL (ref 8–23)
CO2: 20 mmol/L — ABNORMAL LOW (ref 22–32)
Calcium: 8.7 mg/dL — ABNORMAL LOW (ref 8.9–10.3)
Chloride: 106 mmol/L (ref 98–111)
Creatinine, Ser: 1.58 mg/dL — ABNORMAL HIGH (ref 0.61–1.24)
GFR, Estimated: 44 mL/min — ABNORMAL LOW (ref >60)
Glucose, Bld: 100 mg/dL — ABNORMAL HIGH (ref 70–99)
Potassium: 3.9 mmol/L (ref 3.5–5.1)
Sodium: 140 mmol/L (ref 135–145)

## 2024-02-27 LAB — CBC
HCT: 29.7 % — ABNORMAL LOW (ref 39.0–52.0)
Hemoglobin: 9.1 g/dL — ABNORMAL LOW (ref 13.0–17.0)
MCH: 29.9 pg (ref 26.0–34.0)
MCHC: 30.6 g/dL (ref 30.0–36.0)
MCV: 97.7 fL (ref 80.0–100.0)
Platelets: 213 K/uL (ref 150–400)
RBC: 3.04 MIL/uL — ABNORMAL LOW (ref 4.22–5.81)
RDW: 23.9 % — ABNORMAL HIGH (ref 11.5–15.5)
WBC: 46.3 K/uL — ABNORMAL HIGH (ref 4.0–10.5)
nRBC: 2.7 % — ABNORMAL HIGH (ref 0.0–0.2)

## 2024-02-27 SURGERY — EGD (ESOPHAGOGASTRODUODENOSCOPY)
Anesthesia: Monitor Anesthesia Care

## 2024-02-27 MED ORDER — OXYCODONE-ACETAMINOPHEN 5-325 MG PO TABS
1.0000 | ORAL_TABLET | Freq: Three times a day (TID) | ORAL | Status: DC | PRN
Start: 2024-02-27 — End: 2024-03-08
  Administered 2024-02-27 – 2024-03-07 (×9): 1 via ORAL
  Filled 2024-02-27 (×9): qty 1

## 2024-02-27 MED ORDER — PROPOFOL 500 MG/50ML IV EMUL
INTRAVENOUS | Status: DC | PRN
  Administered 2024-02-27: 150 ug/kg/min via INTRAVENOUS

## 2024-02-27 MED ORDER — PROPOFOL 10 MG/ML IV BOLUS
INTRAVENOUS | Status: DC | PRN
  Administered 2024-02-27: 20 mg via INTRAVENOUS
  Administered 2024-02-27: 30 mg via INTRAVENOUS
  Administered 2024-02-27: 40 mg via INTRAVENOUS

## 2024-02-27 MED ORDER — LIDOCAINE HCL 1 % IJ SOLN
INTRAMUSCULAR | Status: AC
  Filled 2024-02-27: qty 20

## 2024-02-27 MED ORDER — LIDOCAINE 2% (20 MG/ML) 5 ML SYRINGE
INTRAMUSCULAR | Status: DC | PRN
  Administered 2024-02-27: 40 mg via INTRAVENOUS

## 2024-02-27 NOTE — Progress Notes (Signed)
 PROGRESS NOTE Jared [name]  FMW:[id number] DOB: November 29, 1945 DOA: 02/24/2024 PCP: [name], FNP  Brief Narrative/Hospital Course: Jared [name] is a 78 y.o. male with PMH of CLL MDS HTN CKD 3a diastolic HF who presented to ED 02/24/24 w/ complain of weakness/lethargy, and patient also having few weeks of unintentional weight loss loss of appetite and abdominal pain. Patient was hypotensive prehospital and given small IVF bolus. In ED anemic hgb 5.6, + fecal occult, LA 5.6 and SBP low 90s. CT a/p with contrast shows a possible gastric wall / perigastric hematoma or other complex fluid collection. Also BLL (L>R) ASD,ascites, splenomegaly and hepatomegaly. GI, general surgery consulted and admitted to ICU.  Transfused 2 units of PRBC on admission and again 2 u on 10/15  Subjective: Seen and examined Overnight vitals stable-although mildly tachypneic, on room air, BP stable Hemoglobin 8.4 this morning Rest of the labs w/ creat better No chest pain. Has loose dark stool Wife at the bedside  Assessment and plan:  ABLA W/ Hemorrhagic shock Large mixed density fluid collection around anterior abdominal wall-likely gastric wall or perigastric hematoma Large volume ascites-could be hemoperitoneum Loose Dark stool: GI and surgery following/Upper GI series did not show evidence of perforation. S/p 4 u prbc so far.  Continue PPI, n.p.o. IV fluids, Zosyn and octreotide drip as per GI.  C diff neg awaiting ultrasound paracentesis as well. Consider CTA GI study if hemodynamically stable-discussed with GI and for EGD today  Recent Labs  Lab 02/26/24 0836 02/26/24 1510 02/26/24 2318 02/27/24 0319 02/27/24 0805  HGB 6.4* 7.6* 8.6* 8.4* 9.1*  HCT 21.7* 25.5* 28.3* 28.1* 29.7*    AKI on CKD 3A AGMA: B/L Creat ~1.7.  Slowly improving and improved encourage oral intake Recent Labs    04/17/23 1042 04/23/23 0955 05/17/23 1216 12/31/23 1601 02/24/24 1446 02/25/24 0310 02/26/24 0309  02/27/24 0805  BUN 13  --  22 27* 21 25* 31* 22  CREATININE 1.17  --  1.43* 1.74* 1.72* 2.09* 1.92* 1.58*  CO2 24  --  22 27 16* 18* 20* 20*  K 4.2   < > 4.6 4.3 4.3 4.8 4.5 3.9   < > = values in this interval not displayed.    Transaminitis-mild Hepatosplenomegaly: GI on board monitor LFTs  CA Significant leukocytosis Continue Zosyn.  Vitals stable blood culture from admission NGTD.  CLL MDS: Chronically elevated WBC count, appears worse on baseline.  Monitor, continue antibiotics. Dr Loretha PA Recent Labs  Lab 02/24/24 1446 02/25/24 0008 02/25/24 0310 02/26/24 0309 02/27/24 0805  WBC 29.0* 28.3* 28.7* 46.6* 46.3*    History of hypertension Bp stable.  Not on meds here  Nonobstructive CAD Mild PHTN: Monitor.  Goals of care: Currently remains DNR..  DVT prophylaxis: SCDs Start: 02/24/24 1724 Code Status:   Code Status: Do not attempt resuscitation (DNR) PRE-ARREST INTERVENTIONS DESIRED Family Communication: plan of care discussed with patient and his wife at bedside. Patient status is: Remains hospitalized because of severity of illness Level of care: Stepdown ? To tele if egd ok later today- askd RN to notify  Dispo: The patient is from: Home            Anticipated disposition: TBD.  Objective: Vitals last 24 hrs: Vitals:   02/27/24 0700 02/27/24 0800 02/27/24 0900 02/27/24 1002  BP: (!) 124/59 136/61 (!) 145/75 (!) 146/76  Pulse: 92 86 93 94  Resp: (!) 24 (!) 27 (!) 25 (!) 27  Temp:  98.3 F (  36.8 C)  97.6 F (36.4 C)  TempSrc:  Oral  Temporal  SpO2: 91% 94% 93% 96%  Weight:      Height:        Physical Examination: General exam: AAOX,3 Weak, pleasant HEENT:Oral mucosa moist, Ear/Nose WNL grossly Respiratory system: CTA Bilaterally Cardiovascular system: S1 & S2 +, No JVD. Gastrointestinal system: Abdomen soft,NT,ND, BS+ Nervous System: Alert,moving all extremities,and following commands. Extremities: extremities warm, leg edema + r?l Skin:  No rashes,no icterus. MSK: Normal muscle bulk,tone, power   Medications reviewed:  Scheduled Meds:  [MAR Hold] sodium chloride    Intravenous Once   [MAR Hold] sodium chloride    Intravenous Once   [MAR Hold] Chlorhexidine  Gluconate Cloth  6 each Topical Daily   [MAR Hold] pantoprazole  (PROTONIX ) IV  40 mg Intravenous Q8H   Continuous Infusions:  sodium chloride  50 mL/hr at 02/27/24 0352   sodium chloride      octreotide (SANDOSTATIN) 500 mcg in sodium chloride  0.9 % 250 mL (2 mcg/mL) infusion 50 mcg/hr (02/27/24 0926)   [MAR Hold] piperacillin-tazobactam (ZOSYN)  IV Stopped (02/27/24 0926)   Diet: Diet Order             Diet NPO time specified  Diet effective now                   Data Reviewed: I have personally reviewed following labs and imaging studies ( see epic result tab) CBC: Recent Labs  Lab 02/21/24 0944 02/24/24 1446 02/25/24 0004 02/25/24 0008 02/25/24 0310 02/25/24 1110 02/26/24 0309 02/26/24 0836 02/26/24 1510 02/26/24 2318 02/27/24 0319 02/27/24 0805  WBC 22.2* 29.0*  --  28.3* 28.7*  --  46.6*  --   --   --   --  46.3*  NEUTROABS 17.9* 22.1*  --   --   --   --   --   --   --   --   --   --   HGB 8.0* 5.6*  --  7.2* 7.1*   < > 6.9* 6.4* 7.6* 8.6* 8.4* 9.1*  HCT 26.3* 19.3*  --  24.0* 23.6*   < > 23.0* 21.7* 25.5* 28.3* 28.1* 29.7*  MCV 99.2 106.6*  --  98.8 98.7  --  98.7  --   --   --   --  97.7  PLT 166 193 182 178 174  --  207  --   --   --   --  213   < > = values in this interval not displayed.   CMP: Recent Labs  Lab 02/24/24 1446 02/25/24 0310 02/26/24 0309 02/27/24 0805  NA 134* 138 142 140  K 4.3 4.8 4.5 3.9  CL 102 104 106 106  CO2 16* 18* 20* 20*  GLUCOSE 148* 115* 113* 100*  BUN 21 25* 31* 22  CREATININE 1.72* 2.09* 1.92* 1.58*  CALCIUM 8.7* 8.5* 8.4* 8.7*   GFR: Estimated Creatinine Clearance: 39.7 mL/min (A) (by C-G formula based on SCr of 1.58 mg/dL (H)). Recent Labs  Lab 02/24/24 1446 02/26/24 0309  AST 28 46*  ALT  13 25  ALKPHOS 252* 227*  BILITOT 1.1 1.3*  PROT 5.4* 5.5*  ALBUMIN 3.4* 3.4*   No results for input(s): LIPASE, AMYLASE in the last 168 hours. No results for input(s): AMMONIA in the last 168 hours. Coagulation Profile:  Recent Labs  Lab 02/24/24 1446 02/25/24 0004  INR 1.4* 1.4*   Unresulted Labs (From admission, onward)     Start  Ordered   02/28/24 0500  CBC  Daily,   R     Question:  Specimen collection method  Answer:  Lab=Lab collect   02/27/24 0747   02/28/24 0500  Basic metabolic panel with GFR  Daily,   R     Question:  Specimen collection method  Answer:  Lab=Lab collect   02/27/24 0747   02/26/24 0900  Hemoglobin and hematocrit, blood  Now then every 6 hours,   R (with TIMED occurrences)     Question:  Specimen collection method  Answer:  Lab=Lab collect   02/26/24 0735   02/24/24 1426  Culture, blood (Routine x 2)  BLOOD CULTURE X 2,   R (with STAT occurrences)      02/24/24 1425           Antimicrobials/Microbiology: Anti-infectives (From admission, onward)    Start     Dose/Rate Route Frequency Ordered Stop   02/24/24 2200  [MAR Hold]  piperacillin-tazobactam (ZOSYN) IVPB 3.375 g        (MAR Hold since Thu 02/27/2024 at 0953.Hold Reason: Transfer to a Procedural area)   3.375 g 12.5 mL/hr over 240 Minutes Intravenous Every 8 hours 02/24/24 1859     02/24/24 1500  piperacillin-tazobactam (ZOSYN) IVPB 3.375 g        3.375 g 100 mL/hr over 30 Minutes Intravenous  Once 02/24/24 1456 02/24/24 1533         Component Value Date/Time   SDES  02/24/2024 1446    BLOOD LEFT FOREARM Performed at [facility], [address], [place], [place] [address]    SPECREQUEST  02/24/2024 1446    BOTTLES DRAWN AEROBIC AND ANAEROBIC Blood Culture adequate volume Performed at [facility], [address] [place], [place] [address]    CULT  02/24/2024 1446    NO GROWTH 2 DAYS Performed at [facility], [address],  [place], [place] [address]    REPTSTATUS PENDING 02/24/2024 1446    Procedures: Procedure(s) (LRB): EGD (ESOPHAGOGASTRODUODENOSCOPY) (N/A)  Mennie LAMY, MD [facility] 02/27/2024, 10:44 AM

## 2024-02-27 NOTE — Progress Notes (Signed)
 PT Cancellation Note  Patient Details Name: Jared [name] MRN: [id number] DOB: [birth year]   Cancelled Treatment:    Reason Eval/Treat Not Completed: Patient at procedure or test/unavailable (pt undergoing paracentesis. Will follow.)   Sylvan Delon Copp PT 02/27/2024  Acute Rehabilitation Services  Office [phone]

## 2024-02-27 NOTE — Progress Notes (Addendum)
 OT Cancellation Note  Patient Details Name: Jared [name] MRN: [id number] DOB: 04/04/1946   Cancelled Treatment:    Reason Eval/Treat Not Completed: OT eval attempted x2. In the a.m., patient was at a procedure or test. In the p.m., pt was getting a paracentesis. Will follow-up another day.   Delanna JINNY Lesches, OTR/L 02/27/2024, 10:59 AM

## 2024-02-27 NOTE — Op Note (Signed)
 [facility] Patient Name: Jared [name] Procedure Date: 02/27/2024 MRN: [id number] Attending MD: Layla Lah , MD, [id number] Date of Birth: [birth year] CSN: [id number] Age: 78 Admit Type: Inpatient Procedure:                Upper GI endoscopy Indications:              Suspected upper gastrointestinal bleeding, abnormal                            CT Providers:                Layla Lah, MD, Jacquelyn Jaci Pierce, RN,                            Janie Billups, Technician Referring MD:              Medicines:                Sedation Administered by an Anesthesia Professional Complications:            No immediate complications. Estimated Blood Loss:     Estimated blood loss was minimal. Procedure:                Pre-Anesthesia Assessment:                           - Prior to the procedure, a History and Physical                            was performed, and patient medications and                            allergies were reviewed. The patient's tolerance of                            previous anesthesia was also reviewed. The risks                            and benefits of the procedure and the sedation                            options and risks were discussed with the patient.                            All questions were answered, and informed consent                            was obtained. Prior Anticoagulants: The patient has                            taken no anticoagulant or antiplatelet agents. ASA                            Grade Assessment: III - A patient with severe  systemic disease. After reviewing the risks and                            benefits, the patient was deemed in satisfactory                            condition to undergo the procedure.                           After obtaining informed consent, the endoscope was                            passed under direct vision. Throughout the                             procedure, the patient's blood pressure, pulse, and                            oxygen saturations were monitored continuously. The                            [id number]-H190 ([id number]) Olympus endoscope was introduced                            through the mouth, and advanced to the second part                            of duodenum. The upper GI endoscopy was                            accomplished without difficulty. The patient                            tolerated the procedure well. Scope In: Scope Out: Findings:      The Z-line was regular and was found 42 cm from the incisors.      Diffuse hemorrhagic mucosa with severe gastritis with no bleeding and       stigmata of recent bleeding was found in the gastric body. Biopsies were       taken with a cold forceps for histology.      The cardia and gastric fundus were normal on retroflexion.      The duodenal bulb, first portion of the duodenum and second portion of       the duodenum were normal. Impression:               - Z-line regular, 42 cm from the incisors.                           - Hemorrhagic gastropathy. Biopsied.                           - Normal duodenal bulb, first portion of the                            duodenum and second portion  of the duodenum. Moderate Sedation:      Moderate (conscious) sedation was personally administered by an       anesthesia professional. The following parameters were monitored: oxygen       saturation, heart rate, blood pressure, and response to care. Recommendation:           - Return patient to hospital ward for ongoing care.                           - Full liquid diet.                           - Continue present medications.                           - Await pathology results.                           - Refer to an interventional radiologist if                            symptoms persist. Procedure Code(s):        --- Professional ---                           [phone],  Esophagogastroduodenoscopy, flexible,                            transoral; with biopsy, single or multiple Diagnosis Code(s):        --- Professional ---                           K92.2, Gastrointestinal hemorrhage, unspecified CPT copyright 2022 American Medical Association. All rights reserved. The codes documented in this report are preliminary and upon coder review may  be revised to meet current compliance requirements. Layla Lah, MD Layla Lah, MD 02/27/2024 10:59:47 AM Number of Addenda: 0

## 2024-02-27 NOTE — Plan of Care (Signed)
  Problem: Education: Goal: Knowledge of General Education information will improve Description: Including pain rating scale, medication(s)/side effects and non-pharmacologic comfort measures Outcome: Progressing   Problem: Clinical Measurements: Goal: Respiratory complications will improve Outcome: Progressing   Problem: Nutrition: Goal: Adequate nutrition will be maintained Outcome: Progressing   Problem: Pain Managment: Goal: General experience of comfort will improve and/or be controlled Outcome: Progressing

## 2024-02-27 NOTE — Progress Notes (Signed)
 Subjective: Sitting up in his chair today.  Having a bit more pain today than yesterday.  Had a BM yesterday but sent for sample so he doesn't know if there was blood present or not.  Denies N/V, but feels more distended today.  Objective: Vital signs in last 24 hours: Temp:  [97.6 F (36.4 C)-99 F (37.2 C)] 98.3 F (36.8 C) (10/16 0800) Pulse Rate:  [85-95] 93 (10/16 0615) Resp:  [20-32] 29 (10/16 0615) BP: (110-187)/(56-76) 123/58 (10/16 0600) SpO2:  [91 %-95 %] 92 % (10/16 0615) Weight:  [79.2 kg] 79.2 kg (10/16 0500) Last BM Date : 02/25/24  Intake/Output from previous day: 10/15 0701 - 10/16 0700 In: 2277 [I.V.:1462.5; Blood:630; IV Piggyback:184.5] Out: 725 [Urine:725] Intake/Output this shift: Total I/O In: -  Out: 325 [Urine:325]  PE: Gen: NAD Heart: regular Lungs: CTAB Abd: soft, but more distended today and a bit tympanitic.  Still with some diffuse mild tenderness, but no guarding or peritonitis.  Lab Results:  Recent Labs    02/25/24 0310 02/25/24 1110 02/26/24 0309 02/26/24 0836 02/26/24 2318 02/27/24 0319  WBC 28.7*  --  46.6*  --   --   --   HGB 7.1*   < > 6.9*   < > 8.6* 8.4*  HCT 23.6*   < > 23.0*   < > 28.3* 28.1*  PLT 174  --  207  --   --   --    < > = values in this interval not displayed.   BMET Recent Labs    02/25/24 0310 02/26/24 0309  NA 138 142  K 4.8 4.5  CL 104 106  CO2 18* 20*  GLUCOSE 115* 113*  BUN 25* 31*  CREATININE 2.09* 1.92*  CALCIUM 8.5* 8.4*   PT/INR Recent Labs    02/24/24 1446 02/25/24 0004  LABPROT 18.1* 18.3*  INR 1.4* 1.4*   CMP     Component Value Date/Time   NA 142 02/26/2024 0309   NA 143 05/17/2023 1216   K 4.5 02/26/2024 0309   CL 106 02/26/2024 0309   CO2 20 (L) 02/26/2024 0309   GLUCOSE 113 (H) 02/26/2024 0309   BUN 31 (H) 02/26/2024 0309   BUN 22 05/17/2023 1216   CREATININE 1.92 (H) 02/26/2024 0309   CREATININE 1.74 (H) 12/31/2023 1601   CALCIUM 8.4 (L) 02/26/2024 0309    PROT 5.5 (L) 02/26/2024 0309   ALBUMIN 3.4 (L) 02/26/2024 0309   AST 46 (H) 02/26/2024 0309   AST 19 12/31/2023 1601   ALT 25 02/26/2024 0309   ALT 10 12/31/2023 1601   ALKPHOS 227 (H) 02/26/2024 0309   BILITOT 1.3 (H) 02/26/2024 0309   BILITOT 1.4 (H) 12/31/2023 1601   GFRNONAA 35 (L) 02/26/2024 0309   GFRNONAA 39 (L) 12/31/2023 1601   GFRAA 59 (L) 10/03/2017 0352   Lipase  No results found for: LIPASE     Studies/Results: DG UGI W SINGLE CM (SOL OR THIN BA) Result Date: 02/25/2024 CLINICAL DATA:  Abdominal pain and distension with symptomatic anemia. Gastric wall thickening versus perigastric hematoma on CT. Evaluate for perforation or ulcer. EXAM: WATER SOLUBLE UPPER GI SERIES TECHNIQUE: Single-column upper GI series was performed using water soluble contrast. Radiation Exposure Index (as provided by the fluoroscopic device): 19.8 mGy Kerma CONTRAST:  100 cc Omnipaque  300 orally COMPARISON:  Abdominopelvic CT 02/24/2024 FINDINGS: The scout abdominal radiograph demonstrates a nonobstructive bowel gas pattern. There are degenerative changes throughout the thoracolumbar  spine. Left total hip arthroplasty noted. Limited water soluble upper GI series was performed in the semi erect and right lateral decubitus positions. The patient drank the contrast without difficulty. There is mild esophageal dysmotility without evidence of stricture, mucosal ulceration or significant hiatal hernia. On incomplete gastric filling, no focal mucosal abnormalities are identified. There is no evidence of gastric perforation or gastric outlet obstruction. In the right lateral decubitus position, there is normal emptying of the stomach into the duodenum. The duodenum appears normal. The ligament of Treitz is normally positioned. Spontaneous gastroesophageal reflux to the aortic arch was noted during the examination. IMPRESSION: 1. Limited water-soluble upper GI series demonstrates no evidence of gastric perforation  or other bowel leak. No focal mucosal abnormalities are identified. 2. Moderate spontaneous gastroesophageal reflux. 3. Upper endoscopy should be considered for further gastric mucosal assessment. Electronically Signed   By: Elsie Perone M.D.   On: 02/25/2024 15:39    Anti-infectives: Anti-infectives (From admission, onward)    Start     Dose/Rate Route Frequency Ordered Stop   02/24/24 2200  piperacillin-tazobactam (ZOSYN) IVPB 3.375 g        3.375 g 12.5 mL/hr over 240 Minutes Intravenous Every 8 hours 02/24/24 1859     02/24/24 1500  piperacillin-tazobactam (ZOSYN) IVPB 3.375 g        3.375 g 100 mL/hr over 30 Minutes Intravenous  Once 02/24/24 1456 02/24/24 1533        Assessment/Plan Hemoperitoneum vs contained perforation, melena -on abx therapy, Zosyn.  WBC 46.6K yesterday up from 28K,  baseline appears to be in the 20s from his CLL/MDS.  Labs currently pending. - got more blood yesterday.  Hgb now 8.4 on last H/H -c diff check was negative -UGI negative for perforation. -will have IR try paracentesis to remove this fluid as able to help with pain and send for cx given rise in WBC today (still AF).  If this is mostly blood that has clotted, very little will likely be removed.  Unclear why this wasn't done yesterday.  Have reached out today to follow up on this.  Awaiting response. -no acute surgical needs at this time, but more distended today.  Will check a plain film to get a new baseline for his distention.   -GI plans for EGD as well -we will follow with you   FEN - NPO for procedure VTE - SCDs ID - Zosyn  CLL MDS Anemia HTN CKD dHF  I reviewed nursing notes, Consultant GI notes, hospitalist notes, last 24 h vitals and pain scores, last 48 h intake and output, last 24 h labs and trends, and last 24 h imaging results.   LOS: 3 days    Burnard FORBES Banter , [facility] 02/27/2024, 8:10 AM Please see Amion for pager number during day hours  7:00am-4:30pm or 7:00am -11:30am on weekends

## 2024-02-27 NOTE — Anesthesia Preprocedure Evaluation (Addendum)
 Anesthesia Evaluation  Patient identified by MRN, date of birth, ID band Patient awake    Reviewed: Allergy & Precautions, NPO status , Patient's Chart, lab work & pertinent test results  History of Anesthesia Complications Negative for: history of anesthetic complications  Airway Mallampati: II  TM Distance: >3 FB Neck ROM: Full    Dental  (+) Edentulous Upper, Missing,    Pulmonary former smoker   Pulmonary exam normal        Cardiovascular hypertension, Pt. 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    Neuro/Psych  Headaches    GI/Hepatic Neg liver ROS,,,?GI bleed   Endo/Other  negative endocrine ROS    Renal/GU negative Renal ROS     Musculoskeletal  (+) Arthritis ,    Abdominal   Peds  Hematology  (+) Blood dyscrasia (Hgb 9.1), anemia   Anesthesia Other Findings   Reproductive/Obstetrics                              Anesthesia Physical Anesthesia Plan  ASA: 3  Anesthesia Plan: MAC   Post-op Pain Management: Minimal or no pain anticipated   Induction:   PONV Risk Score and Plan: 1 and Treatment may vary due to age or medical condition and Propofol  infusion  Airway Management Planned: Natural Airway and Nasal Cannula  Additional Equipment: None  Intra-op Plan:   Post-operative Plan:   Informed Consent: I have reviewed the patients History and Physical, chart, labs and discussed the procedure including the risks, benefits and alternatives for the proposed anesthesia with the patient or authorized representative who has indicated his/her understanding and acceptance.   Patient has DNR.  Discussed DNR with patient and Suspend DNR.     Plan Discussed with: CRNA  Anesthesia Plan Comments:          Anesthesia Quick Evaluation

## 2024-02-27 NOTE — Transfer of Care (Signed)
 Immediate Anesthesia Transfer of Care Note  Patient: Jared [name]  Procedure(s) Performed: EGD (ESOPHAGOGASTRODUODENOSCOPY)  Patient Location: PACU  Anesthesia Type:MAC  Level of Consciousness: awake, alert , oriented, and patient cooperative  Airway & Oxygen Therapy: Patient Spontanous Breathing and Patient connected to face mask oxygen  Post-op Assessment: Report given to RN and Post -op Vital signs reviewed and stable  Post vital signs: Reviewed and stable  Last Vitals:  Vitals Value Taken Time  BP 112/54 02/27/24 10:57  Temp 37.6 C 02/27/24 10:55  Pulse 86 02/27/24 10:59  Resp 22 02/27/24 10:59  SpO2 90 % 02/27/24 10:59  Vitals shown include unfiled device data.  Last Pain:  Vitals:   02/27/24 1055  TempSrc: Temporal  PainSc:       Patients Stated Pain Goal: 0 (02/26/24 2153)  Complications: No notable events documented.

## 2024-02-27 NOTE — Progress Notes (Signed)
 PT Cancellation Note  Patient Details Name: Jared [name] MRN: [id number] DOB: [birth year]   Cancelled Treatment:    Reason Eval/Treat Not Completed: Patient at procedure or test/unavailable. Will follow.  Sylvan Nest Kistler PT 02/27/2024  Acute Rehabilitation Services  Office [phone]

## 2024-02-27 NOTE — Anesthesia Postprocedure Evaluation (Signed)
 Anesthesia Post Note  Patient: FUMIO VANDAM  Procedure(s) Performed: EGD (ESOPHAGOGASTRODUODENOSCOPY)     Patient location during evaluation: PACU Anesthesia Type: MAC Level of consciousness: awake and alert Pain management: pain level controlled Vital Signs Assessment: post-procedure vital signs reviewed and stable Respiratory status: spontaneous breathing, nonlabored ventilation and respiratory function stable Cardiovascular status: blood pressure returned to baseline Postop Assessment: no apparent nausea or vomiting Anesthetic complications: no   No notable events documented.  Last Vitals:  Vitals:   02/27/24 1110 02/27/24 1120  BP: (!) 118/57 123/60  Pulse: 86 86  Resp: (!) 29 (!) 27  Temp:    SpO2: 98% 98%    Last Pain:  Vitals:   02/27/24 1120  TempSrc:   PainSc: 0-No pain                 Vertell Row

## 2024-02-27 NOTE — Brief Op Note (Signed)
 02/27/2024  10:59 AM  PATIENT:  Jared [name]  78 y.o. male  PRE-OPERATIVE DIAGNOSIS:  Acute blood loss anemia  POST-OPERATIVE DIAGNOSIS:  gastric bx r/o h pylori  PROCEDURE:  Procedure(s): EGD (ESOPHAGOGASTRODUODENOSCOPY) (N/A)  SURGEON:  Surgeons and Role:    * Sujey Gundry, MD - Primary  Findings ------------ - EGD showed severe hemorrhagic gastritis in the gastric body of the stomach.  No evidence of any obvious large ulcer or perforation.  Recommendations ------------------------- - Recommend to continue Protonix  IV 40 mg 3 times a day for now - Okay to have full liquid diet - If persistent drop in hemoglobin, recommend IR consult for arteriogram to rule out any source of retroperitoneal bleed - GI will follow    Layla Lah MD, FACP 02/27/2024, 11:01 AM  Contact #  [phone]

## 2024-02-27 NOTE — Interval H&P Note (Signed)
 History and Physical Interval Note:  02/27/2024 10:13 AM  Jared [name]  has presented today for surgery, with the diagnosis of Acute blood loss anemia.  The various methods of treatment have been discussed with the patient and family. After consideration of risks, benefits and other options for treatment, the patient has consented to  Procedure(s): EGD (ESOPHAGOGASTRODUODENOSCOPY) (N/A) as a surgical intervention.  The patient's history has been reviewed, patient examined, no change in status, stable for surgery.  I have reviewed the patient's chart and labs.  Questions were answered to the patient's satisfaction.     Thera Basden

## 2024-02-28 ENCOUNTER — Inpatient Hospital Stay

## 2024-02-28 ENCOUNTER — Inpatient Hospital Stay: Admitting: Hematology and Oncology

## 2024-02-28 DIAGNOSIS — K2970 Gastritis, unspecified, without bleeding: Secondary | ICD-10-CM | POA: Insufficient documentation

## 2024-02-28 DIAGNOSIS — R578 Other shock: Secondary | ICD-10-CM | POA: Diagnosis not present

## 2024-02-28 LAB — CBC
HCT: 29.8 % — ABNORMAL LOW (ref 39.0–52.0)
Hemoglobin: 9.1 g/dL — ABNORMAL LOW (ref 13.0–17.0)
MCH: 29.9 pg (ref 26.0–34.0)
MCHC: 30.5 g/dL (ref 30.0–36.0)
MCV: 98 fL (ref 80.0–100.0)
Platelets: 176 K/uL (ref 150–400)
RBC: 3.04 MIL/uL — ABNORMAL LOW (ref 4.22–5.81)
RDW: 23.7 % — ABNORMAL HIGH (ref 11.5–15.5)
WBC: 40.5 K/uL — ABNORMAL HIGH (ref 4.0–10.5)
nRBC: 3.6 % — ABNORMAL HIGH (ref 0.0–0.2)

## 2024-02-28 LAB — BASIC METABOLIC PANEL WITH GFR
Anion gap: 12 (ref 5–15)
BUN: 20 mg/dL (ref 8–23)
CO2: 20 mmol/L — ABNORMAL LOW (ref 22–32)
Calcium: 8.4 mg/dL — ABNORMAL LOW (ref 8.9–10.3)
Chloride: 106 mmol/L (ref 98–111)
Creatinine, Ser: 1.38 mg/dL — ABNORMAL HIGH (ref 0.61–1.24)
GFR, Estimated: 52 mL/min — ABNORMAL LOW (ref >60)
Glucose, Bld: 81 mg/dL (ref 70–99)
Potassium: 3.9 mmol/L (ref 3.5–5.1)
Sodium: 139 mmol/L (ref 135–145)

## 2024-02-28 MED ORDER — DARBEPOETIN ALFA 150 MCG/0.3ML IJ SOSY
150.0000 ug | PREFILLED_SYRINGE | Freq: Once | INTRAMUSCULAR | Status: AC
  Administered 2024-02-28: 150 ug via SUBCUTANEOUS
  Filled 2024-02-28: qty 0.3

## 2024-02-28 NOTE — Evaluation (Signed)
 Physical Therapy Evaluation Patient Details Name: Jared [name] MRN: [id number] DOB: Nov 21, 1945 Today's Date: 02/28/2024  History of Present Illness  78 y.o. male who presented to ED 02/24/24 w/ complain of weakness/lethargy, and patient also having few weeks of unintentional weight loss loss of appetite and abdominal pain. Dx of hemorrhagic shock, Large mixed density fluid collection around anterior abdominal wall-likely gastric wall or perigastric hematoma. Pt with PMH of CLL MDS HTN CKD 3a diastolic HF  Clinical Impression  Patient presents with decreased mobility due to generalized weakness, decreased balance and decreased activity tolerance.  Previously mobilizing unaided in the home though wife providing help for all IADL's.  Patient currently min A overall for mobility in the room and into the hallway with RW.  Feel he will benefit from continued skilled PT in the acute setting and from HHPT at d/c with 24 hour family support.      If plan is discharge home, recommend the following: A little help with walking and/or transfers;A little help with bathing/dressing/bathroom;Help with stairs or ramp for entrance;Assist for transportation;Assistance with cooking/housework   Can travel by private vehicle        Equipment Recommendations None recommended by PT  Recommendations for Other Services       Functional Status Assessment Patient has had a recent decline in their functional status and demonstrates the ability to make significant improvements in function in a reasonable and predictable amount of time.     Precautions / Restrictions Precautions Precautions: Fall Recall of Precautions/Restrictions: Intact      Mobility  Bed Mobility Overal bed mobility: Needs Assistance Bed Mobility: Supine to Sit     Supine to sit: HOB elevated, Used rails, Supervision     General bed mobility comments: assist for lines and safety    Transfers Overall transfer level: Needs  assistance Equipment used: Rolling walker (2 wheels) Transfers: Sit to/from Stand Sit to Stand: Contact guard assist, Min assist           General transfer comment: assist for balance    Ambulation/Gait Ambulation/Gait assistance: Min assist, +2 safety/equipment Gait Distance (Feet): 60 Feet Assistive device: Rolling walker (2 wheels) Gait Pattern/deviations: Step-through pattern, Decreased stride length, Trunk flexed       General Gait Details: using RW for balance and safety, assist for lines and keeping chair close  Stairs            Wheelchair Mobility     Tilt Bed    Modified Rankin (Stroke Patients Only)       Balance Overall balance assessment: Needs assistance   Sitting balance-Leahy Scale: Fair Sitting balance - Comments: some R lateral lean and instability sitting when trying to raise arms, R knee does not bend much so no foot on the ground on the R Postural control: Right lateral lean   Standing balance-Leahy Scale: Poor Standing balance comment: UE support for balance in standing                             Pertinent Vitals/Pain Pain Assessment Pain Assessment: No/denies pain    Home Living Family/patient expects to be discharged to:: Private residence Living Arrangements: Spouse/significant other Available Help at Discharge: Family Type of Home: House Home Access: Stairs to enter Entrance Stairs-Rails: Doctor, general practice of Steps: 4   Home Layout: One level;Laundry or work area in [employer] Equipment: Agricultural consultant (2 wheels);Cane - single point;BSC/3in1  Prior Function Prior Level of Function : Independent/Modified Independent             Mobility Comments: uses cane (prior knee replacements, L does not bend as much) ADLs Comments: wife does IADL's     Extremity/Trunk Assessment   Upper Extremity Assessment Upper Extremity Assessment: Defer to OT evaluation    Lower Extremity  Assessment Lower Extremity Assessment: Generalized weakness;RLE deficits/detail RLE Deficits / Details: knee flexion to about 30 in supine, strength hip flexion 3/5, knee extension 4-/5    Cervical / Trunk Assessment Cervical / Trunk Assessment: Kyphotic  Communication   Communication Communication: No apparent difficulties    Cognition Arousal: Alert Behavior During Therapy: WFL for tasks assessed/performed   PT - Cognitive impairments: No apparent impairments                         Following commands: Impaired Following commands impaired: Only follows one step commands consistently, Follows one step commands with increased time     Cueing Cueing Techniques: Verbal cues     General Comments General comments (skin integrity, edema, etc.): multiple family members present; VS stable with some drop in SBP from supine 147/80; sitting 118/77, though pt denies symptoms, 144/75 end of session    Exercises     Assessment/Plan    PT Assessment Patient needs continued PT services  PT Problem List Decreased strength;Decreased activity tolerance;Decreased balance;Decreased mobility;Decreased knowledge of use of DME;Decreased safety awareness       PT Treatment Interventions DME instruction;Gait training;Stair training;Patient/family education;Functional mobility training;Therapeutic activities;Therapeutic exercise;Balance training    PT Goals (Current goals can be found in the Care Plan section)  Acute Rehab PT Goals Patient Stated Goal: return to independent PT Goal Formulation: With patient/family Time For Goal Achievement: 03/13/24 Potential to Achieve Goals: Good    Frequency Min 2X/week     Co-evaluation PT/OT/SLP Co-Evaluation/Treatment: Yes Reason for Co-Treatment: Complexity of the patient's impairments (multi-system involvement);For patient/therapist safety PT goals addressed during session: Mobility/safety with mobility;Balance;Proper use of DME          AM-PAC PT 6 Clicks Mobility  Outcome Measure Help needed turning from your back to your side while in a flat bed without using bedrails?: A Little Help needed moving from lying on your back to sitting on the side of a flat bed without using bedrails?: A Little Help needed moving to and from a bed to a chair (including a wheelchair)?: A Little Help needed standing up from a chair using your arms (e.g., wheelchair or bedside chair)?: A Little Help needed to walk in hospital room?: A Little Help needed climbing 3-5 steps with a railing? : Total 6 Click Score: 16    End of Session Equipment Utilized During Treatment: Gait belt Activity Tolerance: Patient tolerated treatment well;Patient limited by fatigue Patient left: in chair;with call bell/phone within reach;with chair alarm set;with family/visitor present   PT Visit Diagnosis: Other abnormalities of gait and mobility (R26.89);Muscle weakness (generalized) (M62.81)    Time: 8751-8684 PT Time Calculation (min) (ACUTE ONLY): 27 min   Charges:   PT Evaluation $PT Eval Moderate Complexity: 1 Mod   PT General Charges $$ ACUTE PT VISIT: 1 Visit         Micheline Portal, PT Acute Rehabilitation Services Office:[phone] 02/28/2024   Montie Portal 02/28/2024, 3:43 PM

## 2024-02-28 NOTE — Evaluation (Signed)
 Occupational Therapy Evaluation Patient Details Name: Jared [name] MRN: [id number] DOB: 1946-05-11 Today's Date: 02/28/2024   History of Present Illness   78 yr old male who presented to the ED 02/24/24 with complaint of weakness/lethargy, and patient also having few weeks of unintentional weight loss, loss of appetite, and abdominal pain. Found to have hemorrhagic shock, mixed density fluid collection around anterior abdominal wall-likely gastric wall or perigastric hematoma. Pt with PMH of CLL MDS HTN CKD 3a diastolic HF     Clinical Impressions The pt is currently presenting below his baseline level of functioning for ADL management. He is limited by the below listed deficits (see OT problem list). During the session, he required CGA to min assist for functional transfers and functional ambulation using a RW, and min to mod assist for self-care tasks, such as simulated toileting and lower body dressing. He reported fatigue with activity. He will benefit from further OT services to maximize his independence with self-care tasks and to facilitate his safe return home. Home health OT is recommended.      If plan is discharge home, recommend the following:   Assist for transportation;Help with stairs or ramp for entrance;Assistance with cooking/housework;A little help with walking and/or transfers;A little help with bathing/dressing/bathroom     Functional Status Assessment   Patient has had a recent decline in their functional status and demonstrates the ability to make significant improvements in function in a reasonable and predictable amount of time.     Equipment Recommendations   None recommended by OT     Recommendations for Other Services         Precautions/Restrictions   Precautions Precautions: Fall Restrictions Weight Bearing Restrictions Per Provider Order: No     Mobility Bed Mobility Overal bed mobility: Needs Assistance Bed Mobility: Supine to Sit      Supine to sit: HOB elevated, Used rails, Supervision     General bed mobility comments: assist for lines and safety    Transfers Overall transfer level: Needs assistance Equipment used: Rolling walker (2 wheels) Transfers: Sit to/from Stand Sit to Stand: Contact guard assist, Min assist           General transfer comment: assist for balance      Balance     Sitting balance-Leahy Scale: Fair         Standing balance comment: CGA to min assist with RW              ADL either performed or assessed with clinical judgement   ADL Overall ADL's : Needs assistance/impaired Eating/Feeding: Independent;Sitting   Grooming: Set up;Sitting   Upper Body Bathing: Set up;Sitting   Lower Body Bathing: Minimal assistance;Sitting/lateral leans   Upper Body Dressing : Set up;Supervision/safety;Sitting   Lower Body Dressing: Minimal assistance;Sitting/lateral leans   Toilet Transfer: Minimal assistance;Ambulation;Rolling walker (2 wheels)   Toileting- Clothing Manipulation and Hygiene: Minimal assistance;Sit to/from stand                Pertinent Vitals/Pain Pain Assessment Pain Assessment: No/denies pain     Extremity/Trunk Assessment Upper Extremity Assessment Upper Extremity Assessment: Right hand dominant;RUE deficits/detail;LUE deficits/detail RUE Deficits / Details: slight chronic appearing shoulder AROM limitations. Elbow and hand AROM WFL. Functional grip strength LUE Deficits / Details: slight chronic appearing shoulder AROM limitations. Elbow and hand AROM WFL. Functional grip strength   Lower Extremity Assessment Lower Extremity Assessment: Generalized weakness RLE Deficits / Details: knee flexion to about 30 in supine, strength hip flexion 3/5,  knee extension 4-/5 (per PT eval)      Communication Communication Communication: No apparent difficulties   Cognition Arousal: Alert Behavior During Therapy: WFL for tasks assessed/performed                OT - Cognition Comments: Oriented x4          Following commands impaired: Only follows one step commands consistently, Follows one step commands with increased time     Cueing  General Comments   Cueing Techniques: Verbal cues  multiple family members present; vitals stable, blood pressure sitting 118/77; 144/75 end of session           Home Living Family/patient expects to be discharged to:: Private residence Living Arrangements: Spouse/significant other Available Help at Discharge: Family Type of Home: House Home Access: Stairs to enter [employer] of Steps: 4 Entrance Stairs-Rails: Right;Left Home Layout: Two level;Laundry or work area in [employer] of Steps: two story home with main level and basement level; his office is in the basement   Bathroom Shower/Tub: Tub/shower unit;Walk-in shower   Bathroom Toilet: Handicapped height     Home Equipment: Agricultural consultant (2 wheels);Cane - single point;BSC/3in1          Prior Functioning/Environment Prior Level of Function : Independent/Modified Independent             Mobility Comments:  (Occasional use of a cane.) ADLs Comments:  (Independent with ADLs. Spouse performs cooking and cleaning.)    OT Problem List: Decreased strength;Impaired balance (sitting and/or standing);Decreased knowledge of use of DME or AE   OT Treatment/Interventions: Self-care/ADL training;Therapeutic exercise;Energy conservation;DME and/or AE instruction;Therapeutic activities;Balance training;Patient/family education      OT Goals(Current goals can be found in the care plan section)   Acute Rehab OT Goals OT Goal Formulation: With patient/family Time For Goal Achievement: 03/13/24 Potential to Achieve Goals: Good ADL Goals Pt Will Perform Grooming: with set-up;with supervision;standing Pt Will Perform Lower Body Dressing: with supervision;sit to/from stand;sitting/lateral  leans Pt Will Transfer to Toilet: with supervision;ambulating Pt Will Perform Toileting - Clothing Manipulation and hygiene: with supervision;sit to/from stand   OT Frequency:  Min 2X/week    Co-evaluation PT/OT/SLP Co-Evaluation/Treatment: Yes Reason for Co-Treatment: Complexity of the patient's impairments (multi-system involvement);For patient/therapist safety PT goals addressed during session: Mobility/safety with mobility;Balance;Proper use of DME OT goals addressed during session: ADL's and self-care      AM-PAC OT 6 Clicks Daily Activity     Outcome Measure Help from another person eating meals?: None Help from another person taking care of personal grooming?: A Little Help from another person toileting, which includes using toliet, bedpan, or urinal?: A Little Help from another person bathing (including washing, rinsing, drying)?: A Lot Help from another person to put on and taking off regular upper body clothing?: A Little Help from another person to put on and taking off regular lower body clothing?: A Little 6 Click Score: 18   End of Session Equipment Utilized During Treatment: Rolling walker (2 wheels);Gait belt Nurse Communication: Mobility status  Activity Tolerance: Patient limited by fatigue Patient left: in chair;with call bell/phone within reach;with chair alarm set;with family/visitor present  OT Visit Diagnosis: Unsteadiness on feet (R26.81);Other abnormalities of gait and mobility (R26.89);Muscle weakness (generalized) (M62.81)                Time: 8751-8690 OT Time Calculation (min): 21 min Charges:  OT General Charges $OT Visit: 1 Visit OT Evaluation $OT Eval Moderate Complexity: 1  Mod    Delanna JINNY Lesches, OTR/L 02/28/2024, 5:04 PM

## 2024-02-28 NOTE — Progress Notes (Signed)
 [facility] Gastroenterology Progress Note  Jared [name] 78 y.o. [birth year]  CC: Anemia, gastric hematoma   Subjective: Patient seen and examined at bedside.  Family at bedside.  Denies any acute issues.  ROS : Afebrile, negative for chest pain   Objective: Vital signs in last 24 hours: Vitals:   02/28/24 0600 02/28/24 0920  BP: 134/67   Pulse:    Resp: (!) 23   Temp:  97.9 F (36.6 C)  SpO2:      Physical Exam: Elderly patient, resting comfortably.  Not in acute distress.  Mild abdominal discomfort on palpation , bowel sound present, no peritoneal signs.  Mood and affect normal.  Lab Results: Recent Labs    02/27/24 0805 02/28/24 0315  NA 140 139  K 3.9 3.9  CL 106 106  CO2 20* 20*  GLUCOSE 100* 81  BUN 22 20  CREATININE 1.58* 1.38*  CALCIUM 8.7* 8.4*   Recent Labs    02/26/24 0309  AST 46*  ALT 25  ALKPHOS 227*  BILITOT 1.3*  PROT 5.5*  ALBUMIN 3.4*   Recent Labs    02/27/24 0805 02/27/24 1237 02/27/24 1603 02/28/24 0315  WBC 46.3*  --   --  40.5*  HGB 9.1*   < > 8.8* 9.1*  HCT 29.7*   < > 29.2* 29.8*  MCV 97.7  --   --  98.0  PLT 213  --   --  176   < > = values in this interval not displayed.   No results for input(s): LABPROT, INR in the last 72 hours.     Assessment/Plan: -Acute blood loss anemia with CT scan showing large mixed density fluid collection around anterior abdominal wall.  Likely gastric wall or perigastric hematoma. - Large volume ascites.  Could be hemoperitoneum - Dark stool.  Patient is complaining of dark brown stool.  Denies black tarry stool.   Recommendations ------------------------- - EGD yesterday showed severe hemorrhagic gastritis without any obvious large ulcer or perforation. - Recommend to continue Protonix  IV 40 mg 3 times a day for 48 hours and then changed to Protonix  IV 40 mg twice a day while in the hospital.  Recommend to discharge home on Protonix  40 mg twice a day for 8 weeks followed by Protonix  40  mg once a day for another 4 weeks. - If evidence of ongoing drop in hemoglobin, recommend IR consult for angiogram or arteriogram - Okay to slowly advance diet from GI standpoint.  GI will sign off.  Call us  back if needed.   Layla Lah MD, FACP 02/28/2024, 9:29 AM  Contact #  [phone]

## 2024-02-28 NOTE — Progress Notes (Signed)
 PROGRESS NOTE [place]  FMW:[id number] DOB: 15-Aug-1945 DOA: 02/24/2024 PCP: [name], FNP  Brief Narrative/Hospital Course: Jared [name] is a 78 y.o. male with PMH of CLL MDS HTN CKD 3a diastolic HF who presented to ED 02/24/24 w/ complain of weakness/lethargy, and patient also having few weeks of unintentional weight loss loss of appetite and abdominal pain. Patient was hypotensive prehospital and given small IVF bolus. In ED anemic hgb 5.6, + fecal occult, LA 5.6 and SBP low 90s. CT a/p with contrast shows a possible gastric wall / perigastric hematoma or other complex fluid collection. Also BLL (L>R) ASD,ascites, splenomegaly and hepatomegaly. GI, general surgery consulted and admitted to ICU.  Transfused 2 units of PRBC on admission and again 2 u on 10/15  Subjective: Seen and examined Overnight afebrile BP stable labs reviewed creatinine further down 1.3 hemoglobin stable 9.1 WBC down 46>4 Wife and son-in-law at the bedside Overall patient feeling better.  Assessment and plan:  ABLA W/ Hemorrhagic shock Large mixed density fluid collection around anterior abdominal wall-likely gastric wall or perigastric hematoma Large volume ascites Severe hemorrhagic gastritis per EGD Loose Dark stool: GI and surgery following/Upper GI series did not show evidence of perforation. S/p 4 u prbc so far. On Zosyn and octreotide drip as per GI.  Loose stools was positive for antigen but C. difficile PCR negative  S/p EGD 10/16 >showed severe hemorrhagic gastritis in the gastric body of the stomach Continue PPI 40  tid-48 hours and then changed to Protonix  IV 40 mg twice a day while in the hospital. Recommend to discharge home on Protonix  40 mg twice a day for 8 weeks followed by Protonix  40 mg once a day for another 4 weeks.  On FLD>ADAT and trend hemoglobin and If persistent drop will need IR consult for arteriogram to rule out any source of retroperitoneal bleed S/p ultrasound  paracentesis 10/16: 1.2 L of peritoneal fluid removed. Recent Labs  Lab 02/27/24 0319 02/27/24 0805 02/27/24 1237 02/27/24 1603 02/28/24 0315  HGB 8.4* 9.1* 8.7* 8.8* 9.1*  HCT 28.1* 29.7* 29.5* 29.2* 29.8*    AKI on CKD 3A AGMA: B/L Creat ~1.7.  Resolved 1.3, bicarb improving at 20 Recent Labs    04/17/23 1042 04/23/23 0955 05/17/23 1216 12/31/23 1601 02/24/24 1446 02/25/24 0310 02/26/24 0309 02/27/24 0805 02/28/24 0315  BUN 13  --  22 27* 21 25* 31* 22 20  CREATININE 1.17  --  1.43* 1.74* 1.72* 2.09* 1.92* 1.58* 1.38*  CO2 24  --  22 27 16* 18* 20* 20* 20*  K 4.2   < > 4.6 4.3 4.3 4.8 4.5 3.9 3.9   < > = values in this interval not displayed.    Transaminitis-mild Hepatosplenomegaly: GI on board monitor LFTs  CAP Significant leukocytosis Continue Zosyn.Vitals stable blood culture from admission NGTD.  CLL MDS: Chronically elevated WBC count, appears worse on baseline.  Monitor, continue antibiotics. Dr Loretha PA Recent Labs  Lab 02/25/24 0008 02/25/24 0310 02/26/24 0309 02/27/24 0805 02/28/24 0315  WBC 28.3* 28.7* 46.6* 46.3* 40.5*    History of hypertension Bp stable.  Not on meds here  Nonobstructive CAD Mild PHTN: Monitor.  Goals of care: Currently remains DNR..  DVT prophylaxis: SCDs Start: 02/24/24 1724 Code Status:   Code Status: Do not attempt resuscitation (DNR) PRE-ARREST INTERVENTIONS DESIRED Family Communication: plan of care discussed with patient and his wife at bedside. Patient status is: Remains hospitalized because of severity of illness Level of care: Stepdown  Dispo: The patient is from: Home            Anticipated disposition: TBD.  Objective: Vitals last 24 hrs: Vitals:   02/28/24 0300 02/28/24 0400 02/28/24 0500 02/28/24 0600  BP: 133/66 121/61 132/65 134/67  Pulse:      Resp: 19 19 20  (!) 23  Temp:  99.6 F (37.6 C)    TempSrc:  Axillary    SpO2:      Weight:      Height:        Physical  Examination: General exam: AAOX,3 HEENT:Oral mucosa moist, Ear/Nose WNL grossly Respiratory system: CTA Bilaterally Cardiovascular system: S1 & S2 +, No JVD. Gastrointestinal system: Abdomen soft,NT,ND, BS+ Nervous System: Alert,moving all extremities,and following commands. Extremities: extremities warm, leg edema + r?l Skin: No rashes,no icterus. MSK: Normal muscle bulk,tone, power   Medications reviewed:  Scheduled Meds:  sodium chloride    Intravenous Once   sodium chloride    Intravenous Once   Chlorhexidine  Gluconate Cloth  6 each Topical Daily   pantoprazole  (PROTONIX ) IV  40 mg Intravenous Q8H   Continuous Infusions:  sodium chloride  50 mL/hr at 02/28/24 9367   octreotide (SANDOSTATIN) 500 mcg in sodium chloride  0.9 % 250 mL (2 mcg/mL) infusion 50 mcg/hr (02/28/24 9367)   piperacillin-tazobactam (ZOSYN)  IV 3.375 g (02/28/24 0654)   Diet: Diet Order             Diet full liquid Fluid consistency: Thin  Diet effective now                   Data Reviewed: I have personally reviewed following labs and imaging studies ( see epic result tab) CBC: Recent Labs  Lab 02/24/24 1446 02/25/24 0004 02/25/24 0008 02/25/24 0310 02/25/24 1110 02/26/24 0309 02/26/24 9163 02/27/24 0319 02/27/24 0805 02/27/24 1237 02/27/24 1603 02/28/24 0315  WBC 29.0*  --  28.3* 28.7*  --  46.6*  --   --  46.3*  --   --  40.5*  NEUTROABS 22.1*  --   --   --   --   --   --   --   --   --   --   --   HGB 5.6*  --  7.2* 7.1*   < > 6.9*   < > 8.4* 9.1* 8.7* 8.8* 9.1*  HCT 19.3*  --  24.0* 23.6*   < > 23.0*   < > 28.1* 29.7* 29.5* 29.2* 29.8*  MCV 106.6*  --  98.8 98.7  --  98.7  --   --  97.7  --   --  98.0  PLT 193   < > 178 174  --  207  --   --  213  --   --  176   < > = values in this interval not displayed.   CMP: Recent Labs  Lab 02/24/24 1446 02/25/24 0310 02/26/24 0309 02/27/24 0805 02/28/24 0315  NA 134* 138 142 140 139  K 4.3 4.8 4.5 3.9 3.9  CL 102 104 106 106 106  CO2  16* 18* 20* 20* 20*  GLUCOSE 148* 115* 113* 100* 81  BUN 21 25* 31* 22 20  CREATININE 1.72* 2.09* 1.92* 1.58* 1.38*  CALCIUM 8.7* 8.5* 8.4* 8.7* 8.4*   GFR: Estimated Creatinine Clearance: 45.5 mL/min (A) (by C-G formula based on SCr of 1.38 mg/dL (H)). Recent Labs  Lab 02/24/24 1446 02/26/24 0309  AST 28 46*  ALT 13 25  ALKPHOS 252*  227*  BILITOT 1.1 1.3*  PROT 5.4* 5.5*  ALBUMIN 3.4* 3.4*   No results for input(s): LIPASE, AMYLASE in the last 168 hours. No results for input(s): AMMONIA in the last 168 hours. Coagulation Profile:  Recent Labs  Lab 02/24/24 1446 02/25/24 0004  INR 1.4* 1.4*   Unresulted Labs (From admission, onward)     Start     Ordered   02/28/24 0500  CBC  Daily,   R     Question:  Specimen collection method  Answer:  Lab=Lab collect   02/27/24 0747   02/28/24 0500  Basic metabolic panel with GFR  Daily,   R     Question:  Specimen collection method  Answer:  Lab=Lab collect   02/27/24 0747           Antimicrobials/Microbiology: Anti-infectives (From admission, onward)    Start     Dose/Rate Route Frequency Ordered Stop   02/24/24 2200  piperacillin-tazobactam (ZOSYN) IVPB 3.375 g        3.375 g 12.5 mL/hr over 240 Minutes Intravenous Every 8 hours 02/24/24 1859     02/24/24 1500  piperacillin-tazobactam (ZOSYN) IVPB 3.375 g        3.375 g 100 mL/hr over 30 Minutes Intravenous  Once 02/24/24 1456 02/24/24 1533         Component Value Date/Time   SDES  02/27/2024 1452    PERITONEAL Performed at [facility], [address] [place], [place] [address]    SPECREQUEST  02/27/2024 1452    NONE Performed at [facility], [address], [place], [place] [address]    CULT  02/27/2024 1452    NO GROWTH < 12 HOURS Performed at [facility], [address], [place], [place] [address]    REPTSTATUS PENDING 02/27/2024 1452    Procedures: Procedure(s) (LRB): EGD (ESOPHAGOGASTRODUODENOSCOPY)  (N/A)  Mennie LAMY, MD [facility] 02/28/2024, 11:31 AM

## 2024-02-28 NOTE — Progress Notes (Signed)
 1 Day Post-Op  Subjective: On exam, patient is resting in bed. NAD. He denies abdominal pain. EGD from yesterday showing gastritis.   IR note reviewed and indicates > 1L of sanguinous fluid removed.   Objective: Vital signs in last 24 hours: Temp:  [97.6 F (36.4 C)-99.6 F (37.6 C)] 97.9 F (36.6 C) (10/17 0920) Pulse Rate:  [83-94] 89 (10/17 0200) Resp:  [19-29] 23 (10/17 0600) BP: (106-147)/(54-78) 134/67 (10/17 0600) SpO2:  [75 %-98 %] 75 % (10/17 0200) Last BM Date : 02/25/24  Intake/Output from previous day: 10/16 0701 - 10/17 0700 In: 1432.5 [I.V.:1297.1; IV Piggyback:135.4] Out: 1380 [Urine:1380] Intake/Output this shift: No intake/output data recorded.  PE: Gen: NAD Heart: regular Lungs: CTAB Abd: soft, non-distended, non-tender  Lab Results:  Recent Labs    02/27/24 0805 02/27/24 1237 02/27/24 1603 02/28/24 0315  WBC 46.3*  --   --  40.5*  HGB 9.1*   < > 8.8* 9.1*  HCT 29.7*   < > 29.2* 29.8*  PLT 213  --   --  176   < > = values in this interval not displayed.   BMET Recent Labs    02/27/24 0805 02/28/24 0315  NA 140 139  K 3.9 3.9  CL 106 106  CO2 20* 20*  GLUCOSE 100* 81  BUN 22 20  CREATININE 1.58* 1.38*  CALCIUM 8.7* 8.4*   PT/INR No results for input(s): LABPROT, INR in the last 72 hours.  CMP     Component Value Date/Time   NA 139 02/28/2024 0315   NA 143 05/17/2023 1216   K 3.9 02/28/2024 0315   CL 106 02/28/2024 0315   CO2 20 (L) 02/28/2024 0315   GLUCOSE 81 02/28/2024 0315   BUN 20 02/28/2024 0315   BUN 22 05/17/2023 1216   CREATININE 1.38 (H) 02/28/2024 0315   CREATININE 1.74 (H) 12/31/2023 1601   CALCIUM 8.4 (L) 02/28/2024 0315   PROT 5.5 (L) 02/26/2024 0309   ALBUMIN 3.4 (L) 02/26/2024 0309   AST 46 (H) 02/26/2024 0309   AST 19 12/31/2023 1601   ALT 25 02/26/2024 0309   ALT 10 12/31/2023 1601   ALKPHOS 227 (H) 02/26/2024 0309   BILITOT 1.3 (H) 02/26/2024 0309   BILITOT 1.4 (H) 12/31/2023 1601    GFRNONAA 52 (L) 02/28/2024 0315   GFRNONAA 39 (L) 12/31/2023 1601   GFRAA 59 (L) 10/03/2017 0352   Lipase  No results found for: LIPASE     Studies/Results: US  Paracentesis Result Date: 02/27/2024 INDICATION: Therapeutic and diagnostic removal of ascites fluid. EXAM: ULTRASOUND GUIDED right-sided PARACENTESIS MEDICATIONS: None. COMPLICATIONS: None immediate. PROCEDURE: Informed written consent was obtained from the patient after a discussion of the risks, benefits and alternatives to treatment. A timeout was performed prior to the initiation of the procedure. Initial ultrasound scanning demonstrates a moderate amount of ascites within the right lower abdominal quadrant. The right lower abdomen was prepped and draped in the usual sterile fashion. 1% lidocaine  was used for local anesthesia. Following this, a 19 gauge 7 cm Yueh catheter was introduced. An ultrasound image was saved for documentation purposes. The paracentesis was performed. The catheter was removed and a dressing was applied. The patient tolerated the procedure well without immediate post procedural complication. FINDINGS: A total of approximately 1.2 of sanguinous fluid was removed. Samples were sent to the laboratory as requested by the clinical team. IMPRESSION: Successful ultrasound-guided paracentesis yielding 1.2 liters of peritoneal fluid. Electronically Signed   By: Cordella  Babcock   On: 02/27/2024 16:33   DG Abd Portable 1V Result Date: 02/27/2024 CLINICAL DATA:  13914 Abdominal distention 13914 EXAM: PORTABLE ABDOMEN - 1 VIEW COMPARISON:  02/25/2024 FINDINGS: Nonobstructive bowel gas pattern.Enteric contrast noted in the decompressed colon.No pneumoperitoneum. No organomegaly or radiopaque calculi. No acute fracture or destructive lesion. 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 IMPRESSION: Nonobstructive bowel gas pattern. Electronically Signed   By: Rogelia Myers  M.D.   On: 02/27/2024 08:53    Anti-infectives: Anti-infectives (From admission, onward)    Start     Dose/Rate Route Frequency Ordered Stop   02/24/24 2200  piperacillin-tazobactam (ZOSYN) IVPB 3.375 g        3.375 g 12.5 mL/hr over 240 Minutes Intravenous Every 8 hours 02/24/24 1859     02/24/24 1500  piperacillin-tazobactam (ZOSYN) IVPB 3.375 g        3.375 g 100 mL/hr over 30 Minutes Intravenous  Once 02/24/24 1456 02/24/24 1533        Assessment/Plan Hemoperitoneum vs contained perforation, melena - Based on IR note it appears more consistent with a bleeding event. His Hb has been stable and there are no signs of worsening intra-abdominal process based on his exam - No indications for surgery at this time - Will defer to GI regarding management of his gastritis - Surgery will sign off. Please reach out with questions/concerns   FEN - Fulls. ADAT VTE - SCDs ID - Zosyn  CLL MDS Anemia HTN CKD dHF  I reviewed nursing notes, Consultant GI notes, hospitalist notes, last 24 h vitals and pain scores, last 48 h intake and output, last 24 h labs and trends, and last 24 h imaging results.   LOS: 4 days    Cordella DELENA Idler , MD [facility] 02/28/2024, 9:54 AM Please see Amion for pager number during day hours 7:00am-4:30pm or 7:00am -11:30am on weekends

## 2024-02-29 DIAGNOSIS — R578 Other shock: Secondary | ICD-10-CM | POA: Diagnosis not present

## 2024-02-29 LAB — CBC
HCT: 27.2 % — ABNORMAL LOW (ref 39.0–52.0)
Hemoglobin: 8.3 g/dL — ABNORMAL LOW (ref 13.0–17.0)
MCH: 29.9 pg (ref 26.0–34.0)
MCHC: 30.5 g/dL (ref 30.0–36.0)
MCV: 97.8 fL (ref 80.0–100.0)
Platelets: 185 K/uL (ref 150–400)
RBC: 2.78 MIL/uL — ABNORMAL LOW (ref 4.22–5.81)
RDW: 23 % — ABNORMAL HIGH (ref 11.5–15.5)
WBC: 31.7 K/uL — ABNORMAL HIGH (ref 4.0–10.5)
nRBC: 3.3 % — ABNORMAL HIGH (ref 0.0–0.2)

## 2024-02-29 LAB — CULTURE, BLOOD (ROUTINE X 2): Culture: NO GROWTH

## 2024-02-29 LAB — GLUCOSE, CAPILLARY: Glucose-Capillary: 79 mg/dL (ref 70–99)

## 2024-02-29 LAB — BASIC METABOLIC PANEL WITH GFR
Anion gap: 11 (ref 5–15)
BUN: 20 mg/dL (ref 8–23)
CO2: 22 mmol/L (ref 22–32)
Calcium: 8.4 mg/dL — ABNORMAL LOW (ref 8.9–10.3)
Chloride: 105 mmol/L (ref 98–111)
Creatinine, Ser: 1.34 mg/dL — ABNORMAL HIGH (ref 0.61–1.24)
GFR, Estimated: 54 mL/min — ABNORMAL LOW (ref >60)
Glucose, Bld: 80 mg/dL (ref 70–99)
Potassium: 3.7 mmol/L (ref 3.5–5.1)
Sodium: 137 mmol/L (ref 135–145)

## 2024-02-29 MED ORDER — ONDANSETRON HCL 4 MG/2ML IJ SOLN
4.0000 mg | Freq: Four times a day (QID) | INTRAMUSCULAR | Status: DC | PRN
  Administered 2024-02-29 – 2024-03-08 (×4): 4 mg via INTRAVENOUS
  Filled 2024-02-29 (×4): qty 2

## 2024-02-29 MED ORDER — MELATONIN 5 MG PO TABS
5.0000 mg | ORAL_TABLET | Freq: Every evening | ORAL | Status: DC | PRN
  Administered 2024-02-29 – 2024-03-03 (×2): 5 mg via ORAL
  Filled 2024-02-29 (×3): qty 1

## 2024-02-29 MED ORDER — INFLUENZA VAC SPLIT HIGH-DOSE 0.5 ML IM SUSY
0.5000 mL | PREFILLED_SYRINGE | INTRAMUSCULAR | Status: DC
  Filled 2024-02-29: qty 0.5

## 2024-02-29 NOTE — Plan of Care (Signed)
  Problem: Clinical Measurements: Goal: Will remain free from infection Outcome: Progressing   Problem: Clinical Measurements: Goal: Respiratory complications will improve Outcome: Progressing   Problem: Clinical Measurements: Goal: Cardiovascular complication will be avoided Outcome: Progressing   Problem: Nutrition: Goal: Adequate nutrition will be maintained Outcome: Progressing   Problem: Elimination: Goal: Will not experience complications related to bowel motility Outcome: Progressing   Problem: Elimination: Goal: Will not experience complications related to urinary retention Outcome: Progressing

## 2024-02-29 NOTE — Progress Notes (Signed)
 PROGRESS NOTE Jared [name]  FMW:[id number] DOB: November 14, 1945 DOA: 02/24/2024 PCP: Jared Prentice SAUNDERS, FNP  Brief Narrative/Hospital Course: Jared [name] is a 78 y.o. male with PMH of CLL MDS HTN CKD 3a diastolic HF who presented to ED 02/24/24 w/ complain of weakness/lethargy, and patient also having few weeks of unintentional weight loss loss of appetite and abdominal pain. Patient was hypotensive prehospital and given small IVF bolus. In ED anemic hgb 5.6, + fecal occult, LA 5.6 and SBP low 90s. CT a/p with contrast shows a possible gastric wall / perigastric hematoma or other complex fluid collection. Also BLL (L>R) ASD,ascites, splenomegaly and hepatomegaly. GI, general surgery consulted and admitted to ICU.  Transfused 2 units of PRBC on admission and again 2 u on 10/15  Subjective: Seen and examined today Wife at the bedside.  He is resting comfortably no complaints Overnight and afebrile BP stable labs reviewed creatinine stable at 1.3 hemoglobin stable 8.8>9.3>8.1, WBC down to 31  Assessment and plan:  ABLA W/ Hemorrhagic shock Large mixed density fluid collection around anterior abdominal wall-likely gastric wall or perigastric hematoma Large volume ascites Severe hemorrhagic gastritis per EGD Loose Dark stool: GI and surgery following/Upper GI series did not show evidence of perforation, ultrasound paracentesis 10/16: 1.2 L of peritoneal fluid removed. S/p 4 u prbc so far and hb has been stable or 8 g Stool- positive for antigen but C. difficile PCR negative  S/p EGD 10/16 >showed severe hemorrhagic gastritis in the gastric body of the stomach Advance diet as tolerated, continue PPI tid- 24 hrs then 40 mg bid- on d/c BID x 8 wW followed  daily by 4 wk If persistent drop will need IR consult for arteriogram to rule out any source of retroperitoneal bleed Recent Labs  Lab 02/27/24 0805 02/27/24 1237 02/27/24 1603 02/28/24 0315 02/29/24 0255  HGB 9.1* 8.7* 8.8* 9.1* 8.3*  HCT  29.7* 29.5* 29.2* 29.8* 27.2*   AKI on CKD 3A AGMA: B/L Creat ~1.7.  Peaked 2.0, no at 1.3, bicarb improving at 22.  Bicarb improved to 22 Recent Labs    04/17/23 1042 04/23/23 0955 05/17/23 1216 12/31/23 1601 02/24/24 1446 02/25/24 0310 02/26/24 0309 02/27/24 0805 02/28/24 0315 02/29/24 0255  BUN 13  --  22 27* 21 25* 31* 22 20 20   CREATININE 1.17  --  1.43* 1.74* 1.72* 2.09* 1.92* 1.58* 1.38* 1.34*  CO2 24  --  22 27 16* 18* 20* 20* 20* 22  K 4.2   < > 4.6 4.3 4.3 4.8 4.5 3.9 3.9 3.7   < > = values in this interval not displayed.    Transaminitis-mild Hepatosplenomegaly: GI on board monitor LFTs  CAP Significant leukocytosis Continue Zosyn.Vitals stable blood culture from admission NGTD.  CLL MDS: Chronically elevated WBC count, peak to 46 at 31, monitor.  Currently on Zosyn with complete 5 days course Recent Labs  Lab 02/25/24 0310 02/26/24 0309 02/27/24 0805 02/28/24 0315 02/29/24 0255  WBC 28.7* 46.6* 46.3* 40.5* 31.7*    History of hypertension Bp stable.  Not on meds here  Nonobstructive CAD Mild PHTN: Monitor.  Goals of care: Currently remains DNR..  DVT prophylaxis: SCDs Start: 02/24/24 1724 Code Status:   Code Status: Do not attempt resuscitation (DNR) PRE-ARREST INTERVENTIONS DESIRED Family Communication: plan of care discussed with patient and his wife at bedside. Patient status is: Remains hospitalized because of severity of illness Level of care: Telemetry   Dispo: The patient is from: Home  Anticipated disposition: Anticipate discharge home with home health next 24 hours   Objective: Vitals last 24 hrs: Vitals:   02/29/24 0854 02/29/24 0900 02/29/24 1000 02/29/24 1033  BP:  138/70 132/63 131/71  Pulse: 88 90 84 95  Resp: 20 (!) 22 (!) 21 20  Temp: 98.4 F (36.9 C)   97.7 F (36.5 C)  TempSrc: Oral   Oral  SpO2: 100% 99% 100% 95%  Weight:      Height:        Physical Examination: General exam: AAOX3, pleasant,  NAD HEENT:Oral mucosa moist, Ear/Nose WNL grossly Respiratory system: Lungs bilaterally clear to auscultation Cardiovascular system: S1 & S2 +, No JVD. Gastrointestinal system: Abdomen soft,NT,ND, BS+ Nervous System: Alert,moving all extremities,and following commands. Extremities: extremities warm, leg edema MILD Skin: No rashes,no icterus. MSK: Normal muscle bulk,tone, power   Medications reviewed:  Scheduled Meds:  sodium chloride    Intravenous Once   sodium chloride    Intravenous Once   Chlorhexidine  Gluconate Cloth  6 each Topical Daily   pantoprazole  (PROTONIX ) IV  40 mg Intravenous Q8H   Continuous Infusions:  piperacillin-tazobactam (ZOSYN)  IV Stopped (02/29/24 9046)   Diet: Diet Order             DIET SOFT Room service appropriate? Yes; Fluid consistency: Thin  Diet effective now                   Data Reviewed: I have personally reviewed following labs and imaging studies ( see epic result tab) CBC: Recent Labs  Lab 02/24/24 1446 02/25/24 0004 02/25/24 0310 02/25/24 1110 02/26/24 0309 02/26/24 0836 02/27/24 0805 02/27/24 1237 02/27/24 1603 02/28/24 0315 02/29/24 0255  WBC 29.0*   < > 28.7*  --  46.6*  --  46.3*  --   --  40.5* 31.7*  NEUTROABS 22.1*  --   --   --   --   --   --   --   --   --   --   HGB 5.6*   < > 7.1*   < > 6.9*   < > 9.1* 8.7* 8.8* 9.1* 8.3*  HCT 19.3*   < > 23.6*   < > 23.0*   < > 29.7* 29.5* 29.2* 29.8* 27.2*  MCV 106.6*   < > 98.7  --  98.7  --  97.7  --   --  98.0 97.8  PLT 193   < > 174  --  207  --  213  --   --  176 185   < > = values in this interval not displayed.   CMP: Recent Labs  Lab 02/25/24 0310 02/26/24 0309 02/27/24 0805 02/28/24 0315 02/29/24 0255  NA 138 142 140 139 137  K 4.8 4.5 3.9 3.9 3.7  CL 104 106 106 106 105  CO2 18* 20* 20* 20* 22  GLUCOSE 115* 113* 100* 81 80  BUN 25* 31* 22 20 20   CREATININE 2.09* 1.92* 1.58* 1.38* 1.34*  CALCIUM 8.5* 8.4* 8.7* 8.4* 8.4*   GFR: Estimated Creatinine  Clearance: 46.8 mL/min (A) (by C-G formula based on SCr of 1.34 mg/dL (H)). Recent Labs  Lab 02/24/24 1446 02/26/24 0309  AST 28 46*  ALT 13 25  ALKPHOS 252* 227*  BILITOT 1.1 1.3*  PROT 5.4* 5.5*  ALBUMIN 3.4* 3.4*   No results for input(s): LIPASE, AMYLASE in the last 168 hours. No results for input(s): AMMONIA in the last 168 hours. Coagulation Profile:  Recent Labs  Lab 02/24/24 1446 02/25/24 0004  INR 1.4* 1.4*   Unresulted Labs (From admission, onward)     Start     Ordered   02/28/24 0500  CBC  Daily,   R     Question:  Specimen collection method  Answer:  Lab=Lab collect   02/27/24 0747   02/28/24 0500  Basic metabolic panel with GFR  Daily,   R     Question:  Specimen collection method  Answer:  Lab=Lab collect   02/27/24 0747           Antimicrobials/Microbiology: Anti-infectives (From admission, onward)    Start     Dose/Rate Route Frequency Ordered Stop   02/24/24 2200  piperacillin-tazobactam (ZOSYN) IVPB 3.375 g        3.375 g 12.5 mL/hr over 240 Minutes Intravenous Every 8 hours 02/24/24 1859     02/24/24 1500  piperacillin-tazobactam (ZOSYN) IVPB 3.375 g        3.375 g 100 mL/hr over 30 Minutes Intravenous  Once 02/24/24 1456 02/24/24 1533         Component Value Date/Time   SDES  02/27/2024 1452    PERITONEAL Performed at [facility], [address], [place], [place] [address]    SPECREQUEST  02/27/2024 1452    NONE Performed at [facility], [address], [place], [place] [address]    CULT  02/27/2024 1452    NO GROWTH 2 DAYS Performed at [facility], [address] [place], [place] [address]    REPTSTATUS PENDING 02/27/2024 1452    Procedures: Procedure(s) (LRB): EGD (ESOPHAGOGASTRODUODENOSCOPY) (N/A)  Mennie LAMY, MD [facility] 02/29/2024, 11:24 AM

## 2024-03-01 ENCOUNTER — Inpatient Hospital Stay (HOSPITAL_COMMUNITY)

## 2024-03-01 ENCOUNTER — Encounter (HOSPITAL_COMMUNITY): Payer: Self-pay | Admitting: Gastroenterology

## 2024-03-01 DIAGNOSIS — R578 Other shock: Secondary | ICD-10-CM | POA: Diagnosis not present

## 2024-03-01 LAB — CBC
HCT: 27.1 % — ABNORMAL LOW (ref 39.0–52.0)
Hemoglobin: 8.1 g/dL — ABNORMAL LOW (ref 13.0–17.0)
MCH: 29.1 pg (ref 26.0–34.0)
MCHC: 29.9 g/dL — ABNORMAL LOW (ref 30.0–36.0)
MCV: 97.5 fL (ref 80.0–100.0)
Platelets: 162 K/uL (ref 150–400)
RBC: 2.78 MIL/uL — ABNORMAL LOW (ref 4.22–5.81)
RDW: 23.5 % — ABNORMAL HIGH (ref 11.5–15.5)
WBC: 24.7 K/uL — ABNORMAL HIGH (ref 4.0–10.5)
nRBC: 3.5 % — ABNORMAL HIGH (ref 0.0–0.2)

## 2024-03-01 LAB — BASIC METABOLIC PANEL WITH GFR
Anion gap: 13 (ref 5–15)
BUN: 24 mg/dL — ABNORMAL HIGH (ref 8–23)
CO2: 20 mmol/L — ABNORMAL LOW (ref 22–32)
Calcium: 8.2 mg/dL — ABNORMAL LOW (ref 8.9–10.3)
Chloride: 105 mmol/L (ref 98–111)
Creatinine, Ser: 1.42 mg/dL — ABNORMAL HIGH (ref 0.61–1.24)
GFR, Estimated: 50 mL/min — ABNORMAL LOW (ref >60)
Glucose, Bld: 75 mg/dL (ref 70–99)
Potassium: 3.6 mmol/L (ref 3.5–5.1)
Sodium: 137 mmol/L (ref 135–145)

## 2024-03-01 LAB — CULTURE, BLOOD (ROUTINE X 2)
Culture: NO GROWTH
Special Requests: ADEQUATE

## 2024-03-01 MED ORDER — ATORVASTATIN CALCIUM 10 MG PO TABS
10.0000 mg | ORAL_TABLET | Freq: Every day | ORAL | Status: DC
  Administered 2024-03-01 – 2024-03-08 (×8): 10 mg via ORAL
  Filled 2024-03-01 (×8): qty 1

## 2024-03-01 MED ORDER — LATANOPROST 0.005 % OP SOLN
1.0000 [drp] | Freq: Every day | OPHTHALMIC | Status: DC
  Administered 2024-03-01 – 2024-03-07 (×7): 1 [drp] via OPHTHALMIC
  Filled 2024-03-01: qty 2.5

## 2024-03-01 MED ORDER — LIDOCAINE 5 % EX PTCH: 1.0000 | MEDICATED_PATCH | Freq: Every day | CUTANEOUS | Status: DC | PRN

## 2024-03-01 MED ORDER — AZELASTINE HCL 0.1 % NA SOLN
1.0000 | Freq: Two times a day (BID) | NASAL | Status: DC | PRN
  Administered 2024-03-06: 1 via NASAL
  Filled 2024-03-01: qty 30

## 2024-03-01 MED ORDER — DORZOLAMIDE HCL-TIMOLOL MAL 2-0.5 % OP SOLN
1.0000 [drp] | Freq: Two times a day (BID) | OPHTHALMIC | Status: DC
  Administered 2024-03-01 – 2024-03-08 (×15): 1 [drp] via OPHTHALMIC
  Filled 2024-03-01: qty 10

## 2024-03-01 MED ORDER — BRIMONIDINE TARTRATE 0.2 % OP SOLN
1.0000 [drp] | Freq: Two times a day (BID) | OPHTHALMIC | Status: DC
  Administered 2024-03-01 – 2024-03-08 (×15): 1 [drp] via OPHTHALMIC
  Filled 2024-03-01: qty 5

## 2024-03-01 MED ORDER — IOHEXOL 9 MG/ML PO SOLN
500.0000 mL | ORAL | Status: AC
  Administered 2024-03-01 (×2): 500 mL via ORAL

## 2024-03-01 MED ORDER — EMPAGLIFLOZIN 10 MG PO TABS
10.0000 mg | ORAL_TABLET | Freq: Every day | ORAL | Status: DC
  Administered 2024-03-01: 10 mg via ORAL
  Filled 2024-03-01 (×2): qty 1

## 2024-03-01 NOTE — Plan of Care (Signed)

## 2024-03-01 NOTE — Progress Notes (Signed)
 PROGRESS NOTE Jared [name]  FMW:[id number] DOB: 10-19-1945 DOA: 02/24/2024 PCP: Jared Prentice SAUNDERS, FNP  Brief Narrative/Hospital Course: Jared [name] is a 78 y.o. male with PMH of CLL MDS HTN CKD 3a diastolic HF who presented to ED 02/24/24 w/ complain of weakness/lethargy, and patient also having few weeks of unintentional weight loss loss of appetite and abdominal pain. Patient was hypotensive prehospital and given small IVF bolus. In ED anemic hgb 5.6, + fecal occult, LA 5.6 and SBP low 90s. CT a/p with contrast shows a possible gastric wall / perigastric hematoma or other complex fluid collection. Also BLL (L>R) ASD,ascites, splenomegaly and hepatomegaly. GI, general surgery consulted and admitted to ICU.  Transfused 2 units of PRBC on admission and again 2 u on 10/15 S/p EGD 10/16 >showed severe hemorrhagic gastritis Hemoglobin stabilized since, tolerating diet transfer out of ICU 10/18  Subjective: Seen and examined On the bedside chair complains of bloating today no bowel movement today, passing flatus had vomiting earlier No abdominal pain no other new complaints Labs with stable hemoglobin 8.1 WBC continues to improve at 24.7  Assessment and plan:  ABLA W/ Hemorrhagic shock Large mixed density fluid collection around anterior abdominal wall-likely gastric wall or perigastric hematoma Large volume ascites Severe hemorrhagic gastritis per EGD Loose Dark stool: GI and surgery following/Upper GI series did not show evidence of perforation, ultrasound paracentesis 10/16: s/p 1.2 L peritoneal fluid  S/p 4 u prbc so far and hb has been stable or 8 g Stool- positive for antigen but C. difficile PCR negative  S/p EGD 10/16 >showed severe hemorrhagic gastritis in the gastric body of the stomach Continue PPI 40 mg bid-on d/c BID x 8 wk followed  daily by 4 wk Having more bloating and abnormal distention today, will obtain CT abdomen pelvis with oral contrast. If persistent drop will need  IR consult for arteriogram to rule out any source of retroperitoneal bleed> but at this time patient has stabilized tolerating diet Recent Labs  Lab 02/27/24 1237 02/27/24 1603 02/28/24 0315 02/29/24 0255 03/01/24 0343  HGB 8.7* 8.8* 9.1* 8.3* 8.1*  HCT 29.5* 29.2* 29.8* 27.2* 27.1*   AKI on CKD 3A AGMA: B/L Creat ~1.7.  Peaked 2.0, no at 1.3, bicarb improved Recent Labs    04/17/23 1042 04/23/23 0955 05/17/23 1216 12/31/23 1601 02/24/24 1446 02/25/24 0310 02/26/24 0309 02/27/24 0805 02/28/24 0315 02/29/24 0255 03/01/24 0343  BUN 13  --  22 27* 21 25* 31* 22 20 20  24*  CREATININE 1.17  --  1.43* 1.74* 1.72* 2.09* 1.92* 1.58* 1.38* 1.34* 1.42*  CO2 24  --  22 27 16* 18* 20* 20* 20* 22 20*  K 4.2   < > 4.6 4.3 4.3 4.8 4.5 3.9 3.9 3.7 3.6   < > = values in this interval not displayed.    Transaminitis-mild Hepatosplenomegaly: stable LFTs  CAP Significant leukocytosis On  zosyn today. Blood CX- NGRD  CLL MDS: Chronically elevated WBC count, peaked to 46k, improving as below.  Oncology aware. Recent Labs  Lab 02/26/24 0309 02/27/24 0805 02/28/24 0315 02/29/24 0255 03/01/24 0343  WBC 46.6* 46.3* 40.5* 31.7* 24.7*    History of hypertension Bp stable.Amlodipine , imdur  and lisinopril  remains on hold.  Nonobstructive CAD Mild PHTN: Monitor.  Home statin will be resumed today, aspirin , Imdur  remains on hold  Goals of care: Currently remains DNR..  DVT prophylaxis: Place and maintain sequential compression device Start: 02/29/24 1126 SCDs Start: 02/24/24 1724 Code Status:   Code  Status: Do not attempt resuscitation (DNR) PRE-ARREST INTERVENTIONS DESIRED Family Communication: plan of care discussed with patient and his wife at bedside. Patient status is: Remains hospitalized because of severity of illness Level of care: Telemetry   Dispo: The patient is from: Home            Anticipated disposition: Anticipate discharge w/ home with home  soon  Objective: Vitals last 24 hrs: Vitals:   02/29/24 1444 02/29/24 1829 03/01/24 0021 03/01/24 0431  BP: 116/68 108/63 (!) 106/56 (!) 106/56  Pulse: 91 88 89 87  Resp: 17 18 17 18   Temp: 99.5 F (37.5 C) 98.9 F (37.2 C) 98.4 F (36.9 C) 98.3 F (36.8 C)  TempSrc: Oral Oral Oral Oral  SpO2: 94% 95% 91% 94%  Weight:    81 kg  Height:        Physical Examination: General exam: AAOX3 HEENT:Oral mucosa moist, Ear/Nose WNL grossly Respiratory system: Lungs bilaterally clear to auscultation Cardiovascular system: S1 & S2 +, No JVD. Gastrointestinal system: Abdomen soft, moderately distended, nontender BS+ Nervous System: Alert,moving all extremities,and following commands. Extremities: extremities warm, leg edema MILD Skin: No rashes,no icterus. MSK: Normal muscle bulk,tone, power   Medications reviewed:  Scheduled Meds:  atorvastatin  10 mg Oral Daily   brimonidine   1 drop Left Eye BID   dorzolamide -timolol   1 drop Left Eye BID   empagliflozin  10 mg Oral Daily   Influenza vac split trivalent PF  0.5 mL Intramuscular Tomorrow-1000   latanoprost   1 drop Left Eye QHS   pantoprazole  (PROTONIX ) IV  40 mg Intravenous Q8H   Continuous Infusions:  piperacillin-tazobactam (ZOSYN)  IV 3.375 g (03/01/24 9360)   Diet: Diet Order             DIET SOFT Room service appropriate? Yes; Fluid consistency: Thin  Diet effective now                   Data Reviewed: I have personally reviewed following labs and imaging studies ( see epic result tab) CBC: Recent Labs  Lab 02/24/24 1446 02/25/24 0004 02/26/24 0309 02/26/24 9163 02/27/24 0805 02/27/24 1237 02/27/24 1603 02/28/24 0315 02/29/24 0255 03/01/24 0343  WBC 29.0*   < > 46.6*  --  46.3*  --   --  40.5* 31.7* 24.7*  NEUTROABS 22.1*  --   --   --   --   --   --   --   --   --   HGB 5.6*   < > 6.9*   < > 9.1* 8.7* 8.8* 9.1* 8.3* 8.1*  HCT 19.3*   < > 23.0*   < > 29.7* 29.5* 29.2* 29.8* 27.2* 27.1*  MCV 106.6*   < >  98.7  --  97.7  --   --  98.0 97.8 97.5  PLT 193   < > 207  --  213  --   --  176 185 162   < > = values in this interval not displayed.   CMP: Recent Labs  Lab 02/26/24 0309 02/27/24 0805 02/28/24 0315 02/29/24 0255 03/01/24 0343  NA 142 140 139 137 137  K 4.5 3.9 3.9 3.7 3.6  CL 106 106 106 105 105  CO2 20* 20* 20* 22 20*  GLUCOSE 113* 100* 81 80 75  BUN 31* 22 20 20  24*  CREATININE 1.92* 1.58* 1.38* 1.34* 1.42*  CALCIUM 8.4* 8.7* 8.4* 8.4* 8.2*   GFR: Estimated Creatinine Clearance: 44.2 mL/min (A) (  by C-G formula based on SCr of 1.42 mg/dL (H)). Recent Labs  Lab 02/24/24 1446 02/26/24 0309  AST 28 46*  ALT 13 25  ALKPHOS 252* 227*  BILITOT 1.1 1.3*  PROT 5.4* 5.5*  ALBUMIN 3.4* 3.4*   No results for input(s): LIPASE, AMYLASE in the last 168 hours. No results for input(s): AMMONIA in the last 168 hours. Coagulation Profile:  Recent Labs  Lab 02/24/24 1446 02/25/24 0004  INR 1.4* 1.4*   Unresulted Labs (From admission, onward)     Start     Ordered   02/28/24 0500  CBC  Daily,   R     Question:  Specimen collection method  Answer:  Lab=Lab collect   02/27/24 0747   02/28/24 0500  Basic metabolic panel with GFR  Daily,   R     Question:  Specimen collection method  Answer:  Lab=Lab collect   02/27/24 0747           Antimicrobials/Microbiology: Anti-infectives (From admission, onward)    Start     Dose/Rate Route Frequency Ordered Stop   02/24/24 2200  piperacillin-tazobactam (ZOSYN) IVPB 3.375 g        3.375 g 12.5 mL/hr over 240 Minutes Intravenous Every 8 hours 02/24/24 1859     02/24/24 1500  piperacillin-tazobactam (ZOSYN) IVPB 3.375 g        3.375 g 100 mL/hr over 30 Minutes Intravenous  Once 02/24/24 1456 02/24/24 1533         Component Value Date/Time   SDES  02/27/2024 1452    PERITONEAL Performed at [facility], [address] [place], [place] [address]    SPECREQUEST  02/27/2024 1452    NONE Performed at  [facility], [address], [place], [place] [address]    CULT  02/27/2024 1452    NO GROWTH 3 DAYS NO ANAEROBES ISOLATED; CULTURE IN PROGRESS FOR 5 DAYS Performed at [facility], [address] [place], [place] [address]    REPTSTATUS PENDING 02/27/2024 1452    Procedures: Procedure(s) (LRB): EGD (ESOPHAGOGASTRODUODENOSCOPY) (N/A)  Mennie LAMY, MD [facility] 03/01/2024, 11:16 AM

## 2024-03-02 DIAGNOSIS — R578 Other shock: Secondary | ICD-10-CM | POA: Diagnosis not present

## 2024-03-02 LAB — CBC
HCT: 27.4 % — ABNORMAL LOW (ref 39.0–52.0)
Hemoglobin: 8.2 g/dL — ABNORMAL LOW (ref 13.0–17.0)
MCH: 29.4 pg (ref 26.0–34.0)
MCHC: 29.9 g/dL — ABNORMAL LOW (ref 30.0–36.0)
MCV: 98.2 fL (ref 80.0–100.0)
Platelets: 166 K/uL (ref 150–400)
RBC: 2.79 MIL/uL — ABNORMAL LOW (ref 4.22–5.81)
RDW: 23.5 % — ABNORMAL HIGH (ref 11.5–15.5)
WBC: 21.4 K/uL — ABNORMAL HIGH (ref 4.0–10.5)
nRBC: 3.4 % — ABNORMAL HIGH (ref 0.0–0.2)

## 2024-03-02 LAB — BASIC METABOLIC PANEL WITH GFR
Anion gap: 12 (ref 5–15)
BUN: 31 mg/dL — ABNORMAL HIGH (ref 8–23)
CO2: 20 mmol/L — ABNORMAL LOW (ref 22–32)
Calcium: 8.2 mg/dL — ABNORMAL LOW (ref 8.9–10.3)
Chloride: 103 mmol/L (ref 98–111)
Creatinine, Ser: 1.95 mg/dL — ABNORMAL HIGH (ref 0.61–1.24)
GFR, Estimated: 34 mL/min — ABNORMAL LOW (ref >60)
Glucose, Bld: 77 mg/dL (ref 70–99)
Potassium: 3.6 mmol/L (ref 3.5–5.1)
Sodium: 135 mmol/L (ref 135–145)

## 2024-03-02 LAB — SURGICAL PATHOLOGY

## 2024-03-02 MED ORDER — SIMETHICONE 80 MG PO CHEW
80.0000 mg | CHEWABLE_TABLET | Freq: Four times a day (QID) | ORAL | Status: AC | PRN
  Administered 2024-03-02 – 2024-03-04 (×2): 80 mg via ORAL
  Filled 2024-03-02 (×2): qty 1

## 2024-03-02 MED ORDER — DOCUSATE SODIUM 100 MG PO CAPS: 100.0000 mg | ORAL_CAPSULE | Freq: Every day | ORAL | Status: DC | PRN

## 2024-03-02 MED ORDER — LACTATED RINGERS IV SOLN: INTRAVENOUS | Status: AC

## 2024-03-02 MED ORDER — PANTOPRAZOLE SODIUM 40 MG IV SOLR
40.0000 mg | Freq: Two times a day (BID) | INTRAVENOUS | Status: DC
  Administered 2024-03-02 – 2024-03-08 (×12): 40 mg via INTRAVENOUS
  Filled 2024-03-02 (×12): qty 10

## 2024-03-02 NOTE — TOC Initial Note (Addendum)
 Transition of Care [facility]) - Initial/Assessment Note    Patient Details  Name: Jared [name] MRN: [id number] Date of Birth: 10/11/1945  [facility] CM/SW Contact:    Tawni CHRISTELLA Eva, LCSW Phone Number: 03/02/2024, 4:01 PM  Clinical Narrative:                  CSW spoke with the pt and the pt's wife regarding the recommendation for home health services. Pt chose [facility], as he has worked with the agency in the past. [facility] has confirmed they are able to accept the pt for PT services.  Pt reported that he has all necessary DME at home. He also stated that his family will provide transportation upon discharge. Home health orders will need to be placed. Care management to follow for discharge needs.  Expected Discharge Plan: Home w Home Health Services Barriers to Discharge: Continued Medical Work up   Patient Goals and CMS Choice  CMS Medicare.gov Compare Post Acute Care list provided to:: Patient Choice offered to / list presented to : Patient      Expected Discharge Plan and Services       Living arrangements for the past 2 months: Single Family Home                     Date DME Agency Contacted: 03/02/24     HH Arranged: PT, OT HH Agency: [facility] Date HH Agency Contacted: 03/02/24 Time HH Agency Contacted: 1600    Prior Living Arrangements/Services Living arrangements for the past 2 months: Single Family Home Lives with:: Self, Spouse Patient language and need for interpreter reviewed:: Yes Do you feel safe going back to the place where you live?: Yes      Need for Family Participation in Patient Care: No (Comment) Care giver support system in place?: No (comment)   Criminal Activity/Legal Involvement Pertinent to Current Situation/Hospitalization: No - Comment as needed  Activities of Daily Living   ADL Screening (condition at time of admission) Independently performs ADLs?: Yes (appropriate for developmental  age) Is the patient deaf or have difficulty hearing?: No Does the patient have difficulty seeing, even when wearing glasses/contacts?: No Does the patient have difficulty concentrating, remembering, or making decisions?: No  Permission Sought/Granted                  Emotional Assessment Appearance:: Appears stated age Attitude/Demeanor/Rapport: Gracious Affect (typically observed): Accepting Orientation: : Oriented to Self, Oriented to Place, Oriented to  Time, Oriented to Situation   Psych Involvement: No (comment)  Admission diagnosis:  Hemorrhagic shock (HCC) [R57.8] Lactic acidosis [E87.20] Weakness [R53.1] Anemia, unspecified type [D64.9] Leukocytosis, unspecified type [D72.829] Patient Active Problem List   Diagnosis Date Noted   Severe gastritis 02/28/2024   Hemorrhagic shock (HCC) 02/24/2024   MDS (myelodysplastic syndrome) (HCC) 02/18/2024   Other secondary pulmonary hypertension (HCC) 04/17/2023   Snoring 04/17/2023   Nonrheumatic mitral valve regurgitation 04/17/2023   Exertional chest pain 04/17/2023   Anemia 04/17/2023   Mixed hyperlipidemia 03/27/2022   Chronic diastolic (congestive) heart failure (HCC) 03/27/2022   Exertional dyspnea 02/27/2022   Essential hypertension 02/27/2022   Palpitations 02/27/2022   Primary osteoarthritis of left hip 10/02/2017   Osteoarthritis of left hip 10/01/2017   Primary osteoarthritis of knee 04/19/2014   BP (high blood pressure) 08/27/2011   Arthritis of left knee 04/16/2011   PCP:  [name], FNP Pharmacy:   [facility] [phone] - [place],  -  [address] [place] [address] Phone: [phone] Fax: [phone]     Social Drivers of Health (SDOH) Social History: SDOH Screenings   Food Insecurity: No Food Insecurity (02/24/2024)  Housing: Low Risk  (02/24/2024)  Transportation Needs: No Transportation Needs (02/24/2024)  Utilities: Not At Risk (02/24/2024)  Social  Connections: Socially Integrated (02/24/2024)  Tobacco Use: Medium Risk (02/27/2024)   SDOH Interventions:     Readmission Risk Interventions    02/26/2024    9:46 AM  Readmission Risk Prevention Plan  Post Dischage Appt Complete  Medication Screening Complete  Transportation Screening Complete

## 2024-03-02 NOTE — Progress Notes (Addendum)
 Physical Therapy Treatment Patient Details Name: Jared [name] MRN: [id number] DOB: June 10, 1945 Today's Date: 03/02/2024   History of Present Illness 78 y.o. male who presented to ED 02/24/24 w/ complain of weakness/lethargy, and patient also having few weeks of unintentional weight loss loss of appetite and abdominal pain. Dx of hemorrhagic shock, Large mixed density fluid collection around anterior abdominal wall-likely gastric wall or perigastric hematoma. Pt with PMH of CLL MDS HTN CKD 3a diastolic HF    PT Comments  Pt tolerates seated exercises without complaints, demonstrates good muscle activation, limited R Knee flexion which pt reports is chronic. Pt amb 20 ft and 30 ft with seated rest break between amb bouts. Pt demonstrates narrow BOS with feet brushing against one another at times, forward flexed trunk, decreased cadence, 1 minor loss of balance posterior requiring assist to recover. Pt remains up in recliner at end of session with all needs in reach.   If plan is discharge home, recommend the following: A little help with walking and/or transfers;A little help with bathing/dressing/bathroom;Help with stairs or ramp for entrance;Assist for transportation;Assistance with cooking/housework   Can travel by private vehicle        Equipment Recommendations  None recommended by PT    Recommendations for Other Services       Precautions / Restrictions Precautions Precautions: Fall Recall of Precautions/Restrictions: Intact Restrictions Weight Bearing Restrictions Per Provider Order: No     Mobility  Bed Mobility               General bed mobility comments: in recliner upon arrival    Transfers Overall transfer level: Needs assistance Equipment used: Rolling walker (2 wheels) Transfers: Sit to/from Stand Sit to Stand: Min assist           General transfer comment: min A to power up from low seated recliner, limited R knee flexion limiting ability to slide foot  back to power forward, reliant on BUE assist to power up, improved to CGA with rocking momentum to power up    Ambulation/Gait Ambulation/Gait assistance: Min assist Gait Distance (Feet): 20 Feet (+30 additional ft) Assistive device: Rolling walker (2 wheels) Gait Pattern/deviations: Step-through pattern, Decreased stride length, Trunk flexed, Narrow base of support Gait velocity: decreased     General Gait Details: narrow BOS with feet touching at times, cues to widen BOS and upright trunk, minimal bil foot clearance with slow cadence, 1 minor LOB posterior requiring min A to recover and regain balance;seated rest break between amb bouts   Stairs             Wheelchair Mobility     Tilt Bed    Modified Rankin (Stroke Patients Only)       Balance Overall balance assessment: Needs assistance         Standing balance support: Reliant on assistive device for balance, During functional activity, Bilateral upper extremity supported Standing balance-Leahy Scale: Poor Standing balance comment: slight posterior lean at times                            Communication Communication Communication: No apparent difficulties  Cognition Arousal: Alert Behavior During Therapy: WFL for tasks assessed/performed   PT - Cognitive impairments: No apparent impairments                         Following commands: Intact      Cueing Cueing Techniques: Verbal cues  Exercises General Exercises - Lower Extremity Long Arc Quad: AROM, Both, 10 reps, Seated (2 sets) Hip Flexion/Marching: AROM, Both, 10 reps, Seated (2 sets) Toe Raises: AROM, Both, 10 reps, Seated Heel Raises: AROM, Both, 10 reps, Seated    General Comments        Pertinent Vitals/Pain Pain Assessment Pain Assessment: Faces Faces Pain Scale: Hurts a little bit Pain Location: abdomen Pain Descriptors / Indicators: Tightness Pain Intervention(s): Limited activity within patient's tolerance,  Monitored during session, Repositioned    Home Living                          Prior Function            PT Goals (current goals can now be found in the care plan section) Acute Rehab PT Goals Patient Stated Goal: return to independent PT Goal Formulation: With patient/family Time For Goal Achievement: 03/13/24 Potential to Achieve Goals: Good Progress towards PT goals: Progressing toward goals    Frequency    Min 2X/week      PT Plan      Co-evaluation              AM-PAC PT 6 Clicks Mobility   Outcome Measure  Help needed turning from your back to your side while in a flat bed without using bedrails?: A Little Help needed moving from lying on your back to sitting on the side of a flat bed without using bedrails?: A Little Help needed moving to and from a bed to a chair (including a wheelchair)?: A Little Help needed standing up from a chair using your arms (e.g., wheelchair or bedside chair)?: A Little Help needed to walk in hospital room?: A Little Help needed climbing 3-5 steps with a railing? : Total 6 Click Score: 16    End of Session Equipment Utilized During Treatment: Gait belt Activity Tolerance: Patient tolerated treatment well Patient left: in chair;with call bell/phone within reach;with chair alarm set Nurse Communication: Mobility status PT Visit Diagnosis: Other abnormalities of gait and mobility (R26.89);Muscle weakness (generalized) (M62.81)     Time: 8556-8492 PT Time Calculation (min) (ACUTE ONLY): 24 min  Charges:    $Gait Training: 8-22 mins $Therapeutic Exercise: 8-22 mins PT General Charges $$ ACUTE PT VISIT: 1 Visit                     Tori Imogene Gravelle PT, DPT 03/02/24, 3:11 PM

## 2024-03-02 NOTE — Progress Notes (Signed)
 PROGRESS NOTE Jared [name]  FMW:[id number] DOB: December 20, 1945 DOA: 02/24/2024 PCP: [name], FNP  Brief Narrative/Hospital Course: Jared [name] is a 78 y.o. male with PMH of CLL MDS HTN CKD 3a diastolic HF who presented to ED 02/24/24 w/ complain of weakness/lethargy, and patient also having few weeks of unintentional weight loss loss of appetite and abdominal pain. Patient was hypotensive prehospital and given small IVF bolus. In ED anemic hgb 5.6, + fecal occult, LA 5.6 and SBP low 90s. CT a/p with contrast shows a possible gastric wall / perigastric hematoma or other complex fluid collection. Also BLL (L>R) ASD,ascites, splenomegaly and hepatomegaly. GI, general surgery consulted and admitted to ICU.  Transfused 2 units of PRBC on admission and again 2 u on 10/15 S/p EGD 10/16 >showed severe hemorrhagic gastritis Hemoglobin stabilized since, tolerating diet transfer out of ICU 10/18  Subjective: Seen and examined Overnight afebrile BP stable labs reviewed this morning hemoglobin remained stable 8.2 WBC downtrending 21 creatinine trended up 1.9 Wife at the bedside No nausea vomiting or leg pain but has decreased appetite Last bowel movement 10/18 -was loose Stool softeneer added  Assessment and plan:  ABLA W/ Hemorrhagic shock Large mixed density fluid collection around anterior abdominal wall-likely gastric wall or perigastric hematoma Large volume ascites Severe hemorrhagic gastritis per EGD Loose Dark stool: GI and surgery following/Upper GI series did not show evidence of perforation, ultrasound paracentesis 10/16: s/p 1.2 L peritoneal fluid  S/p 4 u prbc so far and hb has been stable or 8 g Stool- positive for antigen but C. difficile PCR negative  S/p EGD 10/16 >showed severe hemorrhagic gastritis in the gastric body of the stomach Continue PPI 40 mg bid-on d/c BID x 8 wk followed  daily by 4 wk Having more bloating and abnormal distention CT abd done 10/19-no bowel  obstruction, has cholelithiasis and sigmoid diverticulosis small bilateral pleural effusion with large areas of consolidative changes lower lobes bilaterally, diffuse mesenteric edema, anasarca worsened since prior CT, small ascites  If persistent drop will need IR consult for arteriogram to rule out any source of retroperitoneal bleed but HB is stable since Encourage ambulation, augment diet Recent Labs  Lab 02/27/24 1603 02/28/24 0315 02/29/24 0255 03/01/24 0343 03/02/24 0337  HGB 8.8* 9.1* 8.3* 8.1* 8.2*  HCT 29.2* 29.8* 27.2* 27.1* 27.4*   AKI on CKD 3A AGMA: B/L Creat ~1.7.Peaked 2.0 creatinine fluctuating trending up 1.9, no hypotension, no uses of nephrotoxic medication.  Monitor, add ivf gently for today and reassess labs in the morning encourage oral intake  Recent Labs    05/17/23 1216 12/31/23 1601 02/24/24 1446 02/25/24 0310 02/26/24 0309 02/27/24 0805 02/28/24 0315 02/29/24 0255 03/01/24 0343 03/02/24 0337  BUN 22 27* 21 25* 31* 22 20 20  24* 31*  CREATININE 1.43* 1.74* 1.72* 2.09* 1.92* 1.58* 1.38* 1.34* 1.42* 1.95*  CO2 22 27 16* 18* 20* 20* 20* 22 20* 20*  K 4.6 4.3 4.3 4.8 4.5 3.9 3.9 3.7 3.6 3.6    Transaminitis-mild Hepatosplenomegaly: stable LFTs  CAP Significant leukocytosis On  zosyn bilateral pleural effusion with large area of consolidation bilaterally-Blood CX- NGTD  CLL MDS: Chronically elevated WBC count, peaked to 46k, now in 21k as below.  Oncology aware. Recent Labs  Lab 02/27/24 0805 02/28/24 0315 02/29/24 0255 03/01/24 0343 03/02/24 0337  WBC 46.3* 40.5* 31.7* 24.7* 21.4*    History of hypertension Bp stable and amlodipine , imdur  and lisinopril  has not been resumed.   Nonobstructive CAD Mild  PHTN: Cont statin.Aspirin , Imdur  remains on hold  Goals of care: Currently remains DNR.  Continue full supportive care  DVT prophylaxis: Place and maintain sequential compression device Start: 02/29/24 1126 SCDs Start: 02/24/24  1724 Code Status:   Code Status: Do not attempt resuscitation (DNR) PRE-ARREST INTERVENTIONS DESIRED Family Communication: plan of care discussed with patient and his wife at bedside. Patient status is: Remains hospitalized because of severity of illness Level of care: Telemetry   Dispo: The patient is from: Home            Anticipated disposition: TBD  Objective: Vitals last 24 hrs: Vitals:   03/01/24 2122 03/01/24 2200 03/02/24 0500 03/02/24 0543  BP: (!) 107/56   111/63  Pulse: 77   86  Resp: 20   18  Temp: 98.2 F (36.8 C)   98.6 F (37 C)  TempSrc: Oral   Oral  SpO2: (!) 89% 93%  93%  Weight:   79.2 kg   Height:        Physical Examination: General exam: AAOX3 HEENT:Oral mucosa moist, Ear/Nose WNL grossly Respiratory system: Lungs bilaterally clear to auscultation Cardiovascular system: S1 & S2 +, No JVD. Gastrointestinal system: Abdomen soft, mildly distended bowel sounds present nontender Nervous System: Alert,moving all extremities,and following commands. Extremities: extremities warm, leg edema MILD Skin: No rashes,no icterus. MSK: Normal muscle bulk,tone, power   Medications reviewed:  Scheduled Meds:  atorvastatin  10 mg Oral Daily   brimonidine   1 drop Left Eye BID   dorzolamide -timolol   1 drop Left Eye BID   Influenza vac split trivalent PF  0.5 mL Intramuscular Tomorrow-1000   latanoprost   1 drop Left Eye QHS   pantoprazole  (PROTONIX ) IV  40 mg Intravenous Q12H   Continuous Infusions:  lactated ringers      piperacillin-tazobactam (ZOSYN)  IV 3.375 g (03/02/24 0524)   Diet: Diet Order             DIET SOFT Room service appropriate? Yes; Fluid consistency: Thin  Diet effective now                   Data Reviewed: I have personally reviewed following labs and imaging studies ( see epic result tab) CBC: Recent Labs  Lab 02/24/24 1446 02/25/24 0004 02/27/24 0805 02/27/24 1237 02/27/24 1603 02/28/24 0315 02/29/24 0255 03/01/24 0343  03/02/24 0337  WBC 29.0*   < > 46.3*  --   --  40.5* 31.7* 24.7* 21.4*  NEUTROABS 22.1*  --   --   --   --   --   --   --   --   HGB 5.6*   < > 9.1*   < > 8.8* 9.1* 8.3* 8.1* 8.2*  HCT 19.3*   < > 29.7*   < > 29.2* 29.8* 27.2* 27.1* 27.4*  MCV 106.6*   < > 97.7  --   --  98.0 97.8 97.5 98.2  PLT 193   < > 213  --   --  176 185 162 166   < > = values in this interval not displayed.   CMP: Recent Labs  Lab 02/27/24 0805 02/28/24 0315 02/29/24 0255 03/01/24 0343 03/02/24 0337  NA 140 139 137 137 135  K 3.9 3.9 3.7 3.6 3.6  CL 106 106 105 105 103  CO2 20* 20* 22 20* 20*  GLUCOSE 100* 81 80 75 77  BUN 22 20 20  24* 31*  CREATININE 1.58* 1.38* 1.34* 1.42* 1.95*  CALCIUM 8.7* 8.4*  8.4* 8.2* 8.2*   GFR: Estimated Creatinine Clearance: 32.2 mL/min (A) (by C-G formula based on SCr of 1.95 mg/dL (H)). Recent Labs  Lab 02/24/24 1446 02/26/24 0309  AST 28 46*  ALT 13 25  ALKPHOS 252* 227*  BILITOT 1.1 1.3*  PROT 5.4* 5.5*  ALBUMIN 3.4* 3.4*   No results for input(s): LIPASE, AMYLASE in the last 168 hours. No results for input(s): AMMONIA in the last 168 hours. Coagulation Profile:  Recent Labs  Lab 02/24/24 1446 02/25/24 0004  INR 1.4* 1.4*   Unresulted Labs (From admission, onward)     Start     Ordered   02/28/24 0500  CBC  Daily,   R     Question:  Specimen collection method  Answer:  Lab=Lab collect   02/27/24 0747   02/28/24 0500  Basic metabolic panel with GFR  Daily,   R     Question:  Specimen collection method  Answer:  Lab=Lab collect   02/27/24 0747           Antimicrobials/Microbiology: Anti-infectives (From admission, onward)    Start     Dose/Rate Route Frequency Ordered Stop   02/24/24 2200  piperacillin-tazobactam (ZOSYN) IVPB 3.375 g        3.375 g 12.5 mL/hr over 240 Minutes Intravenous Every 8 hours 02/24/24 1859     02/24/24 1500  piperacillin-tazobactam (ZOSYN) IVPB 3.375 g        3.375 g 100 mL/hr over 30 Minutes Intravenous  Once  02/24/24 1456 02/24/24 1533         Component Value Date/Time   SDES  02/27/2024 1452    PERITONEAL Performed at [facility], [address], [place], [place] [address]    SPECREQUEST  02/27/2024 1452    NONE Performed at [facility], [address] [place], [place] [address]    CULT  02/27/2024 1452    NO GROWTH 3 DAYS NO ANAEROBES ISOLATED; CULTURE IN PROGRESS FOR 5 DAYS Performed at [facility], [address], [place], [place] [address]    REPTSTATUS PENDING 02/27/2024 1452    Procedures: Procedure(s) (LRB): EGD (ESOPHAGOGASTRODUODENOSCOPY) (N/A)  Mennie LAMY, MD [facility] 03/02/2024, 11:02 AM

## 2024-03-03 ENCOUNTER — Inpatient Hospital Stay (HOSPITAL_COMMUNITY)

## 2024-03-03 DIAGNOSIS — R578 Other shock: Secondary | ICD-10-CM | POA: Diagnosis not present

## 2024-03-03 LAB — CBC
HCT: 26.8 % — ABNORMAL LOW (ref 39.0–52.0)
Hemoglobin: 8.3 g/dL — ABNORMAL LOW (ref 13.0–17.0)
MCH: 30.2 pg (ref 26.0–34.0)
MCHC: 31 g/dL (ref 30.0–36.0)
MCV: 97.5 fL (ref 80.0–100.0)
Platelets: 161 K/uL (ref 150–400)
RBC: 2.75 MIL/uL — ABNORMAL LOW (ref 4.22–5.81)
RDW: 23.9 % — ABNORMAL HIGH (ref 11.5–15.5)
WBC: 17.3 K/uL — ABNORMAL HIGH (ref 4.0–10.5)
nRBC: 5.5 % — ABNORMAL HIGH (ref 0.0–0.2)

## 2024-03-03 LAB — BASIC METABOLIC PANEL WITH GFR
Anion gap: 12 (ref 5–15)
BUN: 37 mg/dL — ABNORMAL HIGH (ref 8–23)
CO2: 20 mmol/L — ABNORMAL LOW (ref 22–32)
Calcium: 8.2 mg/dL — ABNORMAL LOW (ref 8.9–10.3)
Chloride: 103 mmol/L (ref 98–111)
Creatinine, Ser: 2.43 mg/dL — ABNORMAL HIGH (ref 0.61–1.24)
GFR, Estimated: 26 mL/min — ABNORMAL LOW (ref >60)
Glucose, Bld: 89 mg/dL (ref 70–99)
Potassium: 3.6 mmol/L (ref 3.5–5.1)
Sodium: 135 mmol/L (ref 135–145)

## 2024-03-03 LAB — AEROBIC/ANAEROBIC CULTURE W GRAM STAIN (SURGICAL/DEEP WOUND): Culture: NO GROWTH

## 2024-03-03 LAB — CREATININE, URINE, RANDOM: Creatinine, Urine: 79 mg/dL

## 2024-03-03 LAB — SODIUM, URINE, RANDOM: Sodium, Ur: 46 mmol/L

## 2024-03-03 MED ORDER — FUROSEMIDE 10 MG/ML IJ SOLN
40.0000 mg | Freq: Once | INTRAMUSCULAR | Status: AC
  Administered 2024-03-03: 40 mg via INTRAVENOUS
  Filled 2024-03-03: qty 4

## 2024-03-03 MED ORDER — TAMSULOSIN HCL 0.4 MG PO CAPS
0.4000 mg | ORAL_CAPSULE | Freq: Every day | ORAL | Status: DC
  Administered 2024-03-03 – 2024-03-07 (×5): 0.4 mg via ORAL
  Filled 2024-03-03 (×5): qty 1

## 2024-03-03 MED ORDER — ALBUMIN HUMAN 25 % IV SOLN
25.0000 g | Freq: Once | INTRAVENOUS | Status: AC
  Administered 2024-03-03: 25 g via INTRAVENOUS
  Filled 2024-03-03: qty 100

## 2024-03-03 NOTE — Care Management Important Message (Signed)
 Important Message  Patient Details  Name: Jared [name] MRN: [id number] Date of Birth: March 05, 1946   Important Message Given:        Mihailo, Sage 03/03/2024, 3:37 PM

## 2024-03-03 NOTE — Progress Notes (Signed)
 Occupational Therapy Treatment Patient Details Name: Jared [name] MRN: [id number] DOB: 01-17-1946 Today's Date: 03/03/2024   History of present illness 78 y.o. male who presented to ED 02/24/24 w/ complain of weakness/lethargy, and patient also having few weeks of unintentional weight loss loss of appetite and abdominal pain. Dx of hemorrhagic shock, Large mixed density fluid collection around anterior abdominal wall-likely gastric wall or perigastric hematoma. Pt with PMH of CLL MDS HTN CKD 3a diastolic HF   OT comments  The pt was seen for functional strengthening, progression of functional activity, and ADL instruction. He progressed to ambulating to and from the bathroom in his room using a RW, and performing upper body grooming in standing at the sink with CGA. He reported having 4/10 abdominal pain and he appeared to be with mild overall lethargy. He was assisted to the bedside chair at the end of the session. Continue OT plan of care to maximize the pt's independence with self-care tasks and to decrease the risk for restricted participation in other meaningful activities. Home health OT is recommended.       If plan is discharge home, recommend the following:  Assist for transportation;Help with stairs or ramp for entrance;Assistance with cooking/housework;A little help with walking and/or transfers;A little help with bathing/dressing/bathroom   Equipment Recommendations  None recommended by OT    Recommendations for Other Services      Precautions / Restrictions Precautions Precautions: Fall Restrictions Weight Bearing Restrictions Per Provider Order: No       Mobility Bed Mobility Overal bed mobility: Needs Assistance Bed Mobility: Supine to Sit     Supine to sit: HOB elevated, Used rails, Supervision          Transfers Overall transfer level: Needs assistance Equipment used: Rolling walker (2 wheels) Transfers: Sit to/from Stand Sit to Stand: Min assist           Balance     Sitting balance-Leahy Scale: Fair         Standing balance comment: CGA to min assist with RW           ADL either performed or assessed with clinical judgement   ADL Overall ADL's : Needs assistance/impaired     Grooming: Contact guard assist;Standing;Cueing for safety Grooming Details (indicate cue type and reason): The pt performed face washing and hand washing in standing at sink. OT verbally cued him to step closer to the sink while performing tasks for added safety.         Upper Body Dressing : Minimal assistance;Sitting Upper Body Dressing Details (indicate cue type and reason): Pt required assist to donn a hospital gown around his back seated EOB.                     Communication Communication Communication: No apparent difficulties   Cognition Arousal: Alert Behavior During Therapy: WFL for tasks assessed/performed Cognition: No apparent impairments        Following commands: Intact Following commands impaired: Only follows one step commands consistently      Cueing   Cueing Techniques: Verbal cues             Pertinent Vitals/ Pain       Pain Assessment Pain Assessment: 0-10 Pain Score: 4  Pain Location: abdomen Pain Intervention(s): Limited activity within patient's tolerance, Monitored during session, Repositioned, Other (comment) (Pt reported receiving pain medication prior to the session.)   Frequency  Min 2X/week        Progress  Toward Goals  OT Goals(current goals can now be found in the care plan section)  Progress towards OT goals: Progressing toward goals  Acute Rehab OT Goals OT Goal Formulation: With patient/family Time For Goal Achievement: 03/13/24 Potential to Achieve Goals: Good  Plan         AM-PAC OT 6 Clicks Daily Activity     Outcome Measure   Help from another person eating meals?: None Help from another person taking care of personal grooming?: A Little Help from another person  toileting, which includes using toliet, bedpan, or urinal?: A Little Help from another person bathing (including washing, rinsing, drying)?: A Little Help from another person to put on and taking off regular upper body clothing?: A Little Help from another person to put on and taking off regular lower body clothing?: A Little 6 Click Score: 19    End of Session Equipment Utilized During Treatment: Rolling walker (2 wheels);Gait belt  OT Visit Diagnosis: Unsteadiness on feet (R26.81);Other abnormalities of gait and mobility (R26.89);Muscle weakness (generalized) (M62.81)   Activity Tolerance Patient tolerated treatment well   Patient Left in chair;with call bell/phone within reach;with family/visitor present   Nurse Communication          Time: 8950-8894 OT Time Calculation (min): 16 min  Charges: OT General Charges $OT Visit: 1 Visit OT Treatments $Self Care/Home Management : 8-22 mins     Delanna JINNY Lesches, OTR/L 03/03/2024, 1:12 PM

## 2024-03-03 NOTE — Progress Notes (Signed)
 PROGRESS NOTE Jared [name]  FMW:[id number] DOB: [birth year] DOA: 02/24/2024 PCP: [name], FNP  Brief Narrative/Hospital Course: Jared [name] is a 78 y.o. male with PMH of CLL MDS HTN CKD 3a diastolic HF who presented to ED 02/24/24 w/ complain of weakness/lethargy, and patient also having few weeks of unintentional weight loss loss of appetite and abdominal pain. Patient was hypotensive prehospital and given small IVF bolus. In ED anemic hgb 5.6, + fecal occult, LA 5.6 and SBP low 90s. CT a/p with contrast shows a possible gastric wall / perigastric hematoma or other complex fluid collection. Also BLL (L>R) ASD,ascites, splenomegaly and hepatomegaly. GI, general surgery consulted and admitted to ICU.  Transfused 2 units of PRBC on admission and again 2 u on 10/15 S/p EGD 10/16 >showed severe hemorrhagic gastritis Hemoglobin stabilized since, tolerating diet transfer out of ICU 10/18 10/21: Creatinine is slowly worsening, nephrology consulted  Subjective: Seen and examined Blood pressure has been stable without hypotension, labs shows creatinine further trending up 2.4 WBC down 17 hemoglobin stable 8.3 Patient was placed on IV hydration due to creatinine trending up yesterday Does have anasarca Last BM 10/20-it was loose and large Patient wife endorses patient has been eating and drinking well He has no specific complaint no nausea vomiting  Assessment and plan:  ABLA W/ Hemorrhagic shock Large mixed density fluid collection around anterior abdominal wall-likely gastric wall or perigastric hematoma Large volume ascites Severe hemorrhagic gastritis per EGD Loose Dark stool: GI and surgery following/Upper GI series did not show evidence of perforation, ultrasound paracentesis 10/16: s/p 1.2 L peritoneal fluid  S/p 4 u prbc so far and hb has been stable or 8 g Stool- positive for antigen but C. difficile PCR negative  S/p EGD 10/16 >showed severe hemorrhagic gastritis in the  gastric body of the stomach Continue PPI 40 mg bid-on d/c BID x 8 wk followed  daily by 4 wk. Patient having bloating distention and CT abdomen and done 10/19-no bowel obstruction, has cholelithiasis and sigmoid diverticulosis small bilateral pleural effusion with large areas of consolidative changes lower lobes bilaterally, diffuse mesenteric edema, anasarca worsened since prior CT, small ascites  If persistent dropin hb will need IR consult for arteriogram to rule out any source of retroperitoneal bleed but HB is stable since.  Continue to augment diet Recent Labs  Lab 02/28/24 0315 02/29/24 0255 03/01/24 0343 03/02/24 0337 03/03/24 0417  HGB 9.1* 8.3* 8.1* 8.2* 8.3*  HCT 29.8* 27.2* 27.1* 27.4* 26.8*   AKI on CKD 3A AGMA: B/L Creat ~1.7.Peaked 2.0 creatinine fluctuating but not trending up persistently 2.4 this morning, does have anasarca abdominal distention but no obvious hypotension not on nephrotoxic medications or NSAIDs.  Obtain renal ultrasound,consulted Nephrology. Received gentle IV fluid hydration yesterday as he had loose bowel movement yesterday Continue IV fluid hydration avoid hypotension and nephrotoxic agent-intake output charted as below w/ Net IO Since Admission: 6,680.07 mL [03/03/24 1111]   Intake/Output Summary (Last 24 hours) at 03/03/2024 1111 Last data filed at 03/03/2024 0700 Gross per 24 hour  Intake 1170.37 ml  Output 600 ml  Net 570.37 ml    Recent Labs    12/31/23 1601 02/24/24 1446 02/25/24 0310 02/26/24 0309 02/27/24 0805 02/28/24 0315 02/29/24 0255 03/01/24 0343 03/02/24 0337 03/03/24 0417  BUN 27* 21 25* 31* 22 20 20  24* 31* 37*  CREATININE 1.74* 1.72* 2.09* 1.92* 1.58* 1.38* 1.34* 1.42* 1.95* 2.43*  CO2 27 16* 18* 20* 20* 20* 22 20* 20* 20*  K 4.3 4.3 4.8 4.5 3.9 3.9 3.7 3.6 3.6 3.6    Transaminitis-mild Hepatosplenomegaly: stable LFTs  CAP Significant leukocytosis On  zosyn day 9 will stop today Imaging showed  bilateral pleural  effusion with large area of consolidation-Blood CX- NGTD  CLL-with chronic leukocytosis not on treatment and on observation only MDS-recent diagnosis: Chronically elevated WBC count, peaked to 46k, now down in 17k.Oncology aware. Followed by Dr. Loretha, he is on weekly ESA, due to patient's age and clinical status -bone marrow transplant was not feasible for MDS Recent Labs  Lab 02/28/24 0315 02/29/24 0255 03/01/24 0343 03/02/24 0337 03/03/24 0417  WBC 40.5* 31.7* 24.7* 21.4* 17.3*    History of hypertension Bp stable and amlodipine , imdur  and lisinopril   remains on hold since admission.   Nonobstructive CAD Mild PHTN: Cont statin.Aspirin , Imdur  remains on hold  Goals of care: Currently remains DNR.  Continue full supportive care  DVT prophylaxis: Place and maintain sequential compression device Start: 02/29/24 1126 SCDs Start: 02/24/24 1724 Code Status:   Code Status: Do not attempt resuscitation (DNR) PRE-ARREST INTERVENTIONS DESIRED Family Communication: plan of care discussed with patient and his wife at bedside. Patient status is: Remains hospitalized because of severity of illness Level of care: Telemetry   Dispo: The patient is from: Home            Anticipated disposition: TBD  Objective: Vitals last 24 hrs: Vitals:   03/02/24 1925 03/02/24 2052 03/03/24 0500 03/03/24 0539  BP: 126/67 102/62  109/64  Pulse: 88 83  82  Resp: 18 18  18   Temp:  99 F (37.2 C)  98.8 F (37.1 C)  TempSrc:  Oral  Oral  SpO2: 93% 90%  96%  Weight:   78.3 kg   Height:        Physical Examination: General exam: AAOX3, pleasant HEENT:Oral mucosa moist, Ear/Nose WNL grossly Respiratory system: Lungs bilaterally clear to auscultation Cardiovascular system: S1 & S2 +, No JVD. Gastrointestinal system: Abdomen soft, nontender, distended Nervous System: Alert,moving all extremities,and following commands. Extremities: extremities warm, leg edema minimal on the ankle Skin: No  rashes,no icterus. MSK: Normal muscle bulk,tone, power   Medications reviewed:  Scheduled Meds:  atorvastatin  10 mg Oral Daily   brimonidine   1 drop Left Eye BID   dorzolamide -timolol   1 drop Left Eye BID   Influenza vac split trivalent PF  0.5 mL Intramuscular Tomorrow-1000   latanoprost   1 drop Left Eye QHS   pantoprazole  (PROTONIX ) IV  40 mg Intravenous Q12H   tamsulosin   0.4 mg Oral QHS   Continuous Infusions:  piperacillin-tazobactam (ZOSYN)  IV 3.375 g (03/03/24 0554)   Diet: Diet Order             DIET SOFT Room service appropriate? Yes; Fluid consistency: Thin  Diet effective now                   Data Reviewed: I have personally reviewed following labs and imaging studies ( see epic result tab) CBC: Recent Labs  Lab 02/28/24 0315 02/29/24 0255 03/01/24 0343 03/02/24 0337 03/03/24 0417  WBC 40.5* 31.7* 24.7* 21.4* 17.3*  HGB 9.1* 8.3* 8.1* 8.2* 8.3*  HCT 29.8* 27.2* 27.1* 27.4* 26.8*  MCV 98.0 97.8 97.5 98.2 97.5  PLT 176 185 162 166 161   CMP: Recent Labs  Lab 02/28/24 0315 02/29/24 0255 03/01/24 0343 03/02/24 0337 03/03/24 0417  NA 139 137 137 135 135  K 3.9 3.7 3.6 3.6 3.6  CL 106 105 105 103 103  CO2 20* 22 20* 20* 20*  GLUCOSE 81 80 75 77 89  BUN 20 20 24* 31* 37*  CREATININE 1.38* 1.34* 1.42* 1.95* 2.43*  CALCIUM 8.4* 8.4* 8.2* 8.2* 8.2*   GFR: Estimated Creatinine Clearance: 25.8 mL/min (A) (by C-G formula based on SCr of 2.43 mg/dL (H)). Recent Labs  Lab 02/26/24 0309  AST 46*  ALT 25  ALKPHOS 227*  BILITOT 1.3*  PROT 5.5*  ALBUMIN 3.4*   No results for input(s): LIPASE, AMYLASE in the last 168 hours. No results for input(s): AMMONIA in the last 168 hours. Coagulation Profile:  No results for input(s): INR, PROTIME in the last 168 hours.  Unresulted Labs (From admission, onward)     Start     Ordered   03/04/24 0500  Basic metabolic panel with GFR  Daily,   R     Question:  Specimen collection method  Answer:   Lab=Lab collect   03/03/24 0816   03/04/24 0500  CBC  Daily,   R     Question:  Specimen collection method  Answer:  Lab=Lab collect   03/03/24 0816           Antimicrobials/Microbiology: Anti-infectives (From admission, onward)    Start     Dose/Rate Route Frequency Ordered Stop   02/24/24 2200  piperacillin-tazobactam (ZOSYN) IVPB 3.375 g        3.375 g 12.5 mL/hr over 240 Minutes Intravenous Every 8 hours 02/24/24 1859 03/03/24 2359   02/24/24 1500  piperacillin-tazobactam (ZOSYN) IVPB 3.375 g        3.375 g 100 mL/hr over 30 Minutes Intravenous  Once 02/24/24 1456 02/24/24 1533         Component Value Date/Time   SDES  02/27/2024 1452    PERITONEAL Performed at [facility], [address] [place], [place] [address]    SPECREQUEST  02/27/2024 1452    NONE Performed at [facility], [address] [place], [place] [address]    CULT  02/27/2024 1452    NO GROWTH 4 DAYS NO ANAEROBES ISOLATED; CULTURE IN PROGRESS FOR 5 DAYS Performed at [facility], [address], [place], [place] [address]    REPTSTATUS PENDING 02/27/2024 1452    Procedures: Procedure(s) (LRB): EGD (ESOPHAGOGASTRODUODENOSCOPY) (N/A)  Mennie LAMY, MD [facility] 03/03/2024, 11:11 AM

## 2024-03-03 NOTE — Consult Note (Signed)
 Reason for Consult: Acute kidney injury on chronic kidney disease stage IIIa Referring Physician: Mennie LAMY [facility])  HPI:  78 year old man with past medical history significant for hypertension, myelodysplastic syndrome, chronic lymphocytic leukemia, diastolic heart failure and what appears to be baseline chronic kidney disease stage IIIa with a creatinine ranging 1.3-1.6.  He was brought to the emergency room with increasing lethargy and weakness and was found to be hypotensive with acute blood loss anemia with evidence of gastric wall versus perigastric hematoma seen on contrasted CT abdomen/pelvis of 02/24/2024.  He was transiently hemodynamically unstable with a pattern consistent with hemorrhagic shock.  EGD done 10/16 showed severe hemorrhagic gastritis without obvious ulcer or perforation and he has been on PPI therapy.  Concern is raised with his rising creatinine that has gone from his baseline of 1.4 now to 2.4 over the past 2 days with inaccurately documented urine output.  He has not had any contrast exposure since 10/13 and medications are reviewed.  He has had some relative hypotension but no profound drops.  He does report some worsening lower extremity edema while here in the hospital and is net +6.9 L since admission.  He denies any clear changes to his shortness of breath.   Past Medical History:  Diagnosis Date   Arthritis    knees and back   Enlarged prostate    takes Flomax    Headache    Hypertension    stress test done about 12 yrs ago - results wnl per pt.    Past Surgical History:  Procedure Laterality Date   BACK SURGERY  2002   CORONARY PRESSURE/FFR STUDY N/A 04/23/2023   Procedure: CORONARY PRESSURE/FFR STUDY;  Surgeon: Elmira Newman PARAS, MD;  Location: [facility];  Service: Cardiovascular;  Laterality: N/A;   ESOPHAGOGASTRODUODENOSCOPY N/A 02/27/2024   Procedure: EGD (ESOPHAGOGASTRODUODENOSCOPY);  Surgeon: Elicia Claw, MD;  Location: [facility];   Service: Gastroenterology;  Laterality: N/A;   JOINT REPLACEMENT     KNEE ARTHROSCOPY Left 05/2004   thelbert 09/27/2010   RIGHT/LEFT HEART CATH AND CORONARY ANGIOGRAPHY N/A 04/23/2023   Procedure: RIGHT/LEFT HEART CATH AND CORONARY ANGIOGRAPHY;  Surgeon: Elmira Newman PARAS, MD;  Location: [facility];  Service: Cardiovascular;  Laterality: N/A;   SHOULDER ARTHROSCOPY WITH OPEN ROTATOR CUFF REPAIR Right 2009   /notes 09/13/2010   TOTAL HIP ARTHROPLASTY Left 10/02/2017   TOTAL HIP ARTHROPLASTY Left 10/02/2017   Procedure: LEFT TOTAL HIP ARTHROPLASTY ANTERIOR APPROACH;  Surgeon: Liam Lerner, MD;  Location: [facility];  Service: Orthopedics;  Laterality: Left;   TOTAL KNEE ARTHROPLASTY  04/16/2011   Procedure: TOTAL KNEE ARTHROPLASTY;  Surgeon: Lerner PARAS Liam;  Location: [facility];  Service: Orthopedics;  Laterality: Left;  LEFT TOTAL KNEE ARTHROPLASTY    TOTAL KNEE ARTHROPLASTY Right 04/19/2014   Procedure: RIGHT TOTAL KNEE ARTHROPLASTY;  Surgeon: Lerner PARAS Liam, MD;  Location: [facility] OR;  Service: Orthopedics;  Laterality: Right;    Family History  Problem Relation Age of Onset   Migraines Mother    Cancer Sister    Cancer Sister     Social History:  reports that he quit smoking about 49 years ago. His smoking use included cigarettes. He started smoking about 59 years ago. He has a 10 pack-year smoking history. He has never used smokeless tobacco. He reports that he does not drink alcohol and does not use drugs.  Allergies: No Known Allergies  Medications: Scheduled:  atorvastatin  10 mg Oral Daily   brimonidine   1 drop Left Eye BID   dorzolamide -timolol   1 drop Left Eye BID   Influenza vac split trivalent PF  0.5 mL Intramuscular Tomorrow-1000   latanoprost   1 drop Left Eye QHS   pantoprazole  (PROTONIX ) IV  40 mg Intravenous Q12H   tamsulosin   0.4 mg Oral QHS       Latest Ref Rng & Units 03/03/2024    4:17 AM 03/02/2024    3:37 AM 03/01/2024    3:43 AM  BMP  Glucose 70 - 99 mg/dL 89   77  75   BUN 8 - 23 mg/dL 37  31  24   Creatinine 0.61 - 1.24 mg/dL 7.56  8.04  8.57   Sodium 135 - 145 mmol/L 135  135  137   Potassium 3.5 - 5.1 mmol/L 3.6  3.6  3.6   Chloride 98 - 111 mmol/L 103  103  105   CO2 22 - 32 mmol/L 20  20  20    Calcium 8.9 - 10.3 mg/dL 8.2  8.2  8.2       Latest Ref Rng & Units 03/03/2024    4:17 AM 03/02/2024    3:37 AM 03/01/2024    3:43 AM  CBC  WBC 4.0 - 10.5 K/uL 17.3  21.4  24.7   Hemoglobin 13.0 - 17.0 g/dL 8.3  8.2  8.1   Hematocrit 39.0 - 52.0 % 26.8  27.4  27.1   Platelets 150 - 400 K/uL 161  166  162    Urinalysis    Component Value Date/Time   COLORURINE YELLOW 02/25/2024 0617   APPEARANCEUR CLOUDY (A) 02/25/2024 0617   LABSPEC 1.031 (H) 02/25/2024 0617   PHURINE 5.0 02/25/2024 0617   GLUCOSEU >=500 (A) 02/25/2024 0617   HGBUR NEGATIVE 02/25/2024 0617   BILIRUBINUR NEGATIVE 02/25/2024 0617   KETONESUR 5 (A) 02/25/2024 0617   PROTEINUR NEGATIVE 02/25/2024 0617   NITRITE NEGATIVE 02/25/2024 0617   LEUKOCYTESUR NEGATIVE 02/25/2024 0617   CT ABDOMEN PELVIS WO CONTRAST Result Date: 03/01/2024 CLINICAL DATA:  Abdominal distension. EXAM: CT ABDOMEN AND PELVIS WITHOUT CONTRAST TECHNIQUE: Multidetector CT imaging of the abdomen and pelvis was performed following the standard protocol without IV contrast. RADIATION DOSE REDUCTION: This exam was performed according to the departmental dose-optimization program which includes automated exposure control, adjustment of the mA and/or kV according to patient size and/or use of iterative reconstruction technique. COMPARISON:  CT abdomen pelvis dated 02/24/2024. FINDINGS: Evaluation of this exam is limited in the absence of intravenous contrast as well as due to respiratory motion. Evaluation is also limited due to anasarca and streak artifact caused by left hip arthroplasty. Lower chest: Small bilateral pleural effusions. Large areas of consolidative changes involving the lower lobes bilaterally may  represent atelectasis or pneumonia and progressed since the prior CT. No intra-abdominal free air. Diffuse mesenteric edema and small ascites. Hepatobiliary: The liver is grossly unremarkable. No biliary dilatation. Gallstone. Vicarious excretion of contrast also noted in the gallbladder. Pancreas: Unremarkable. No pancreatic ductal dilatation or surrounding inflammatory changes. Spleen: Normal in size without focal abnormality. Adrenals/Urinary Tract: The adrenal glands unremarkable. There is no hydronephrosis or nephrolithiasis on either side. Bilateral perinephric stranding, nonspecific. The urinary bladder is suboptimally evaluated due to streak artifact caused by left hip arthroplasty. Stomach/Bowel: Oral contrast noted throughout the bowel and colon. There is sigmoid diverticulosis. No bowel obstruction. Normal appendix. Vascular/Lymphatic: Moderate aortoiliac atherosclerotic disease. The IVC is grossly unremarkable. No portal venous gas. No obvious adenopathy. Reproductive: The prostate  gland is poorly visualized. Other: Diffuse mesenteric and subcutaneous edema and anasarca, worsened since the prior CT. Musculoskeletal: Osteopenia with degenerative changes. Total left hip arthroplasty. No acute osseous pathology. IMPRESSION: 1. No bowel obstruction. Normal appendix. 2. Sigmoid diverticulosis. 3. Cholelithiasis. 4. Small bilateral pleural effusions with large areas of consolidative changes involving the lower lobes bilaterally may represent atelectasis or pneumonia and progressed since the prior CT. 5. Anasarca, worsened since the prior CT. 6.  Aortic Atherosclerosis (ICD10-I70.0). Electronically Signed   By: Vanetta Chou M.D.   On: 03/01/2024 15:51    Review of Systems  Constitutional:  Positive for fatigue. Negative for appetite change, chills and fever.  HENT:  Negative for ear pain, nosebleeds and trouble swallowing.   Eyes:  Negative for redness and visual disturbance.  Respiratory:  Positive  for shortness of breath. Negative for cough and wheezing.        For the past few months  Cardiovascular:  Positive for leg swelling. Negative for chest pain.  Gastrointestinal:  Negative for abdominal pain, blood in stool, diarrhea and vomiting.       Dark stools, on oral iron  Genitourinary:  Positive for decreased urine volume. Negative for difficulty urinating, flank pain and hematuria.  Musculoskeletal:  Negative for back pain, gait problem and neck pain.  Skin:  Negative for rash and wound.  Neurological:  Positive for weakness. Negative for light-headedness and headaches.   Blood pressure (!) 108/50, pulse 75, temperature 97.9 F (36.6 C), temperature source Oral, resp. rate 16, height 5' 11 (1.803 m), weight 78.3 kg, SpO2 94%. Physical Exam Vitals and nursing note reviewed.  Constitutional:      General: He is not in acute distress.    Appearance: He is obese. He is not ill-appearing.  HENT:     Head: Normocephalic and atraumatic.     Right Ear: External ear normal.     Left Ear: External ear normal.     Nose: Nose normal.     Mouth/Throat:     Mouth: Mucous membranes are dry.     Pharynx: Oropharynx is clear.  Eyes:     General: No scleral icterus.    Extraocular Movements: Extraocular movements intact.     Conjunctiva/sclera: Conjunctivae normal.  Cardiovascular:     Rate and Rhythm: Normal rate and regular rhythm.     Pulses: Normal pulses.     Heart sounds: Normal heart sounds.  Pulmonary:     Effort: Pulmonary effort is normal.     Breath sounds: Normal breath sounds. No wheezing or rales.  Abdominal:     General: Bowel sounds are normal. There is distension.     Palpations: Abdomen is soft.  Musculoskeletal:        General: Normal range of motion.     Cervical back: Normal range of motion and neck supple.     Right lower leg: Edema present.     Left lower leg: Edema present.     Comments: 2+ bilateral pitting lower extremity edema  Skin:    General: Skin  is warm and dry.  Neurological:     Mental Status: He is alert and oriented to person, place, and time. Mental status is at baseline.     Assessment/Plan: 1.  Acute kidney injury on chronic kidney disease stage IIIa: Clinically suspected to be decreased effective arterial blood volume in the setting of interstitial excess; possibly related to his history of diastolic heart failure and need for earlier aggressive volume resuscitation/replacement.  I will send off for urine electrolytes and await results of renal ultrasound.  I will order for albumin/furosemide  to augment urine output and help see if we can try and begin diuresis. Avoid nephrotoxic medications including NSAIDs and iodinated intravenous contrast exposure unless the latter is absolutely indicated.  Preferred narcotic agents for pain control are hydromorphone , fentanyl , and methadone. Morphine should not be used. Avoid Baclofen and avoid oral sodium phosphate  and magnesium  citrate based laxatives / bowel preps. Continue strict Input and Output monitoring. Will monitor the patient closely with you and intervene or adjust therapy as indicated by changes in clinical status/labs. 2.  Anion gap metabolic acidosis: Mild and secondary to acute kidney injury.  Monitor with medical management at this time without need to begin bicarbonate supplementation. 3.  Anemia: In patient with underlying CLL and myelodysplastic syndrome.  Now with acute on chronic with recent evidence of acute blood loss that appears to have been temporized. 4.  Hypertension: Agree with holding antihypertensive medications at this time with soft blood pressures. 5.  Possible community-acquired pneumonia: Status post intravenous Zosyn therapy.  Blood cultures negative to date.  Gordy MARLA Blanch 03/03/2024, 1:27 PM

## 2024-03-03 NOTE — Progress Notes (Signed)
 Mobility Specialist Progress Note:   03/03/24 1505  Mobility  Activity Dangled on edge of bed  Level of Assistance Independent  Assistive Device None  Range of Motion/Exercises All extremities  Activity Response Tolerated well  Mobility Referral Yes  Mobility visit 1 Mobility  Mobility Specialist Start Time (ACUTE ONLY) 1447  Mobility Specialist Stop Time (ACUTE ONLY) 1500  Mobility Specialist Time Calculation (min) (ACUTE ONLY) 13 min   Pt was received in bed and agreed to mobility and bedside exercises. Bedside exercises: Leg Extensions: 1 x 8 Lateral Arm raises: 1 x 8 Sitting marches: 1 x 8  Pt had no complaints at the end of session with all needs met. Call bell in reach.  [employer] - Mobility Specialist

## 2024-03-04 ENCOUNTER — Inpatient Hospital Stay (HOSPITAL_COMMUNITY)

## 2024-03-04 DIAGNOSIS — R578 Other shock: Secondary | ICD-10-CM | POA: Diagnosis not present

## 2024-03-04 LAB — RENAL FUNCTION PANEL
Albumin: 2.8 g/dL — ABNORMAL LOW (ref 3.5–5.0)
Anion gap: 14 (ref 5–15)
BUN: 37 mg/dL — ABNORMAL HIGH (ref 8–23)
CO2: 20 mmol/L — ABNORMAL LOW (ref 22–32)
Calcium: 8.2 mg/dL — ABNORMAL LOW (ref 8.9–10.3)
Chloride: 103 mmol/L (ref 98–111)
Creatinine, Ser: 2.75 mg/dL — ABNORMAL HIGH (ref 0.61–1.24)
GFR, Estimated: 23 mL/min — ABNORMAL LOW (ref >60)
Glucose, Bld: 82 mg/dL (ref 70–99)
Phosphorus: 2.9 mg/dL (ref 2.5–4.6)
Potassium: 3.3 mmol/L — ABNORMAL LOW (ref 3.5–5.1)
Sodium: 136 mmol/L (ref 135–145)

## 2024-03-04 LAB — CBC
HCT: 26.3 % — ABNORMAL LOW (ref 39.0–52.0)
Hemoglobin: 8 g/dL — ABNORMAL LOW (ref 13.0–17.0)
MCH: 29.2 pg (ref 26.0–34.0)
MCHC: 30.4 g/dL (ref 30.0–36.0)
MCV: 96 fL (ref 80.0–100.0)
Platelets: 154 K/uL (ref 150–400)
RBC: 2.74 MIL/uL — ABNORMAL LOW (ref 4.22–5.81)
RDW: 24.2 % — ABNORMAL HIGH (ref 11.5–15.5)
WBC: 15.4 K/uL — ABNORMAL HIGH (ref 4.0–10.5)
nRBC: 6.7 % — ABNORMAL HIGH (ref 0.0–0.2)

## 2024-03-04 MED ORDER — POTASSIUM CHLORIDE CRYS ER 20 MEQ PO TBCR
20.0000 meq | EXTENDED_RELEASE_TABLET | Freq: Two times a day (BID) | ORAL | Status: AC
  Administered 2024-03-04 (×2): 20 meq via ORAL
  Filled 2024-03-04 (×2): qty 1

## 2024-03-04 NOTE — Progress Notes (Signed)
 Patient ID: Jared [name], male   DOB: [birth year], 78 y.o.   MRN: [id number] [facility] Progress Note   Assessment/ Plan:   1.  Acute kidney injury on chronic kidney disease stage IIIa: Clinically suspected to be decreased effective arterial blood volume in the setting of interstitial excess in the setting of diastolic heart failure/earlier volume resuscitation however, urine electrolytes pointing largely to ATN (possibly ischemic related to recent acute blood loss/hemorrhagic shock).  Will continue to hold antihypertensive therapy to allow for higher blood pressure/improvement of renal perfusion and monitor for renal recovery with as needed furosemide  dosing for signs of volume excess. 2.  Anion gap metabolic acidosis: Mild and secondary to acute kidney injury.  Monitor with medical management at this time without need to begin bicarbonate supplementation. 3.  Anemia: He has underlying CLL and myelodysplastic syndrome and suffered acute on chronic with recent evidence of acute blood loss (perigastric versus gastric wall). 4.  Hypokalemia: Exacerbated by diuretic induced losses, status post oral replacement. 5.  Possible community-acquired pneumonia: Status post intravenous Zosyn therapy.  Blood cultures negative to date.  Subjective:   Denies any acute events overnight and reports that he continues to feel fatigued   Objective:   BP (!) 107/58 (BP Location: Left Arm)   Pulse 80   Temp 98.4 F (36.9 C) (Oral)   Resp 16   Ht 5' 11 (1.803 m)   Wt 80 kg   SpO2 98%   BMI 24.60 kg/m   Intake/Output Summary (Last 24 hours) at 03/04/2024 1210 Last data filed at 03/04/2024 9349 Gross per 24 hour  Intake 445.5 ml  Output 1300 ml  Net -854.5 ml   Weight change: 1.7 kg  Physical Exam: Gen: Comfortably sitting up in recliner, wife at bedside CVS: Pulse regular rhythm, normal rate, S1 and S2 normal Resp: Clear to auscultation, no rales/rhonchi Abd: Soft, obese, mild  tenderness over epigastric area Ext: 2+ lower extremity edema  Imaging: US  RENAL Result Date: 03/03/2024 CLINICAL DATA:  Renal failure. EXAM: RENAL / URINARY TRACT ULTRASOUND COMPLETE COMPARISON:  None Available. FINDINGS: Right Kidney: Renal measurements: 10.0 cm x 5.3 cm x 5.2 cm = volume: 142.6 mL. There is diffusely increased echogenicity of the renal parenchyma. A 2.3 cm x 2.1 cm x 1.6 cm simple right renal cyst is seen within the mid right kidney. No hydronephrosis is visualized. Left Kidney: Renal measurements: 10.3 cm x 4.6 cm x 5.1 cm = volume: 125.1 mL. There is diffusely increased echogenicity of the renal parenchyma. A 1.9 cm x 1.7 cm x 1.6 cm simple left renal cyst is seen with the mid to lower left kidney. No hydronephrosis is visualized. Bladder: Appears normal for degree of bladder distention. The bilateral ureteral jets are not visualized peer Other: There is a moderate amount of ascites. IMPRESSION: 1. Bilateral echogenic kidneys which likely represents sequelae associated with medical renal disease. 2. Bilateral simple renal cysts. 3. Ascites. Electronically Signed   By: Suzen Dials M.D.   On: 03/03/2024 18:03    Labs: BMET Recent Labs  Lab 02/27/24 0805 02/28/24 0315 02/29/24 0255 03/01/24 0343 03/02/24 0337 03/03/24 0417 03/04/24 0431  NA 140 139 137 137 135 135 136  K 3.9 3.9 3.7 3.6 3.6 3.6 3.3*  CL 106 106 105 105 103 103 103  CO2 20* 20* 22 20* 20* 20* 20*  GLUCOSE 100* 81 80 75 77 89 82  BUN 22 20 20  24* 31* 37* 37*  CREATININE 1.58* 1.38*  1.34* 1.42* 1.95* 2.43* 2.75*  CALCIUM 8.7* 8.4* 8.4* 8.2* 8.2* 8.2* 8.2*  PHOS  --   --   --   --   --   --  2.9   CBC Recent Labs  Lab 03/01/24 0343 03/02/24 0337 03/03/24 0417 03/04/24 0431  WBC 24.7* 21.4* 17.3* 15.4*  HGB 8.1* 8.2* 8.3* 8.0*  HCT 27.1* 27.4* 26.8* 26.3*  MCV 97.5 98.2 97.5 96.0  PLT 162 166 161 154   Medications:     atorvastatin  10 mg Oral Daily   brimonidine   1 drop Left Eye BID    dorzolamide -timolol   1 drop Left Eye BID   Influenza vac split trivalent PF  0.5 mL Intramuscular Tomorrow-1000   latanoprost   1 drop Left Eye QHS   pantoprazole  (PROTONIX ) IV  40 mg Intravenous Q12H   potassium chloride   20 mEq Oral BID   tamsulosin   0.4 mg Oral QHS   Gordy Blanch, MD 03/04/2024, 12:10 PM

## 2024-03-04 NOTE — Progress Notes (Signed)
 Physical Therapy Treatment Patient Details Name: Jared [name] MRN: [id number] DOB: 1945-12-31 Today's Date: 03/04/2024   History of Present Illness 78 y.o. male who presented to ED 02/24/24 w/ complain of weakness/lethargy, and patient also having few weeks of unintentional weight loss loss of appetite and abdominal pain. Dx of hemorrhagic shock, Large mixed density fluid collection around anterior abdominal wall-likely gastric wall or perigastric hematoma. Pt with PMH of CLL MDS HTN CKD 3a diastolic HF    PT Comments  Pt agreeable to working with therapy. Ambulated ~40 feet with a RW, performed 5 reps sit to stands, performed seated LE exercises. Pt tolerated activity well. Wife present during session. Continue to recommend HHPT f/u.     If plan is discharge home, recommend the following: A little help with walking and/or transfers;A little help with bathing/dressing/bathroom;Help with stairs or ramp for entrance;Assist for transportation;Assistance with cooking/housework   Can travel by private vehicle        Equipment Recommendations  None recommended by PT    Recommendations for Other Services       Precautions / Restrictions Precautions Precautions: Fall Recall of Precautions/Restrictions: Intact Restrictions Weight Bearing Restrictions Per Provider Order: No     Mobility  Bed Mobility               General bed mobility comments: in recliner upon arrival    Transfers Overall transfer level: Needs assistance Equipment used: Rolling walker (2 wheels) Transfers: Sit to/from Stand Sit to Stand: Min assist           General transfer comment: min A to power up from low seated recliner, limited R knee flexion limiting ability to slide foot back to power forward, reliant on BUE assist to power up, improved to CGA with rocking momentum to power up    Ambulation/Gait Ambulation/Gait assistance: Contact guard assist Gait Distance (Feet): 40 Feet Assistive device:  Rolling walker (2 wheels) Gait Pattern/deviations: Step-through pattern, Decreased stride length       General Gait Details: narrow BOS with feet touching at times, cues to widen BOS and upright trunk, minimal bil foot clearance with slow cadence.   Stairs             Wheelchair Mobility     Tilt Bed    Modified Rankin (Stroke Patients Only)       Balance Overall balance assessment: Needs assistance         Standing balance support: Reliant on assistive device for balance, During functional activity, Bilateral upper extremity supported Standing balance-Leahy Scale: Poor                              Communication Communication Communication: No apparent difficulties  Cognition Arousal: Alert Behavior During Therapy: WFL for tasks assessed/performed   PT - Cognitive impairments: No apparent impairments                         Following commands: Intact      Cueing Cueing Techniques: Verbal cues  Exercises General Exercises - Lower Extremity Ankle Circles/Pumps: AROM, Both, 10 reps Long Arc Quad: AROM, Both, 10 reps Hip ABduction/ADduction: AROM, Both, 10 reps Hip Flexion/Marching: Both, 10 reps, AROM Other Exercises Other Exercises: 5 reps sit to stands    General Comments        Pertinent Vitals/Pain Pain Assessment Pain Assessment: Faces Faces Pain Scale: Hurts a little bit Pain Location:  abdomen Pain Descriptors / Indicators: Discomfort Pain Intervention(s): Monitored during session    Home Living                          Prior Function            PT Goals (current goals can now be found in the care plan section) Progress towards PT goals: Progressing toward goals    Frequency    Min 2X/week      PT Plan      Co-evaluation              AM-PAC PT 6 Clicks Mobility   Outcome Measure  Help needed turning from your back to your side while in a flat bed without using bedrails?: A  Little Help needed moving from lying on your back to sitting on the side of a flat bed without using bedrails?: A Little Help needed moving to and from a bed to a chair (including a wheelchair)?: A Little Help needed standing up from a chair using your arms (e.g., wheelchair or bedside chair)?: A Little Help needed to walk in hospital room?: A Little Help needed climbing 3-5 steps with a railing? : A Lot 6 Click Score: 17    End of Session Equipment Utilized During Treatment: Gait belt Activity Tolerance: Patient tolerated treatment well;Patient limited by fatigue Patient left: in chair;with call bell/phone within reach;with family/visitor present   PT Visit Diagnosis: Other abnormalities of gait and mobility (R26.89);Muscle weakness (generalized) (M62.81)     Time: 9060-9041 PT Time Calculation (min) (ACUTE ONLY): 19 min  Charges:    $Gait Training: 8-22 mins PT General Charges $$ ACUTE PT VISIT: 1 Visit                        Dannial SQUIBB, PT Acute Rehabilitation  Office: [phone]

## 2024-03-04 NOTE — Progress Notes (Signed)
 Patient complains of abdominal discomfort, nausea and emesis x 2 occurrence. On assessment and abdomen appears to be more distended. Hospitalist provider updated, see new order.

## 2024-03-04 NOTE — Plan of Care (Signed)
  Problem: Education: Goal: Knowledge of General Education information will improve Description: Including pain rating scale, medication(s)/side effects and non-pharmacologic comfort measures Outcome: Progressing   Problem: Clinical Measurements: Goal: Ability to maintain clinical measurements within normal limits will improve Outcome: Progressing   Problem: Coping: Goal: Level of anxiety will decrease Outcome: Progressing   

## 2024-03-04 NOTE — Progress Notes (Signed)
 PROGRESS NOTE Jared [name]  FMW:[id number] DOB: [birth year] DOA: 02/24/2024 PCP: Jared Prentice SAUNDERS, FNP  Brief Narrative/Hospital Course: Jared [name] is a 78 y.o. male with PMH of CLL MDS HTN CKD 3a diastolic HF who presented to ED 02/24/24 w/ complain of weakness/lethargy, and patient also having few weeks of unintentional weight loss loss of appetite and abdominal pain. Patient was hypotensive prehospital and given small IVF bolus. In ED anemic hgb 5.6, + fecal occult, LA 5.6 and SBP low 90s. CT a/p with contrast shows a possible gastric wall / perigastric hematoma or other complex fluid collection. Also BLL (L>R) ASD,ascites, splenomegaly and hepatomegaly. GI, general surgery consulted and admitted to ICU.  Transfused 2 units of PRBC on admission and again 2 u on 10/15 S/p EGD 10/16 >showed severe hemorrhagic gastritis Hemoglobin stabilized since, tolerating diet transfer out of ICU 10/18 10/21: Creatinine is slowly worsening, nephrology consulted, renal ultrasound obtained no hydronephrosis-received IV albumin and Lasix  x 1  Subjective: Seen and examined Abdomen bloating is not worse remains stable, reports she has been passing gas, no nausea vomiting abdominal pain Overnight hemodynamically stable afebrile on room air Labs this morning shows creatinine at 2.7 trending up WBC down further 15.4 hemoglobin 8 Last BM 10/20-it was loose and large Urine output 1300 cc-following 40 of IV Lasix  and albumin 25 g  Assessment and plan:  ABLA W/ Hemorrhagic shock Large mixed density fluid collection around anterior abdominal wall-likely gastric wall or perigastric hematoma Large volume ascites Severe hemorrhagic gastritis per EGD Loose Dark stool: GI and surgery following/Upper GI series did not show evidence of perforation, ultrasound paracentesis 10/16: s/p 1.2 L peritoneal fluid  S/p 4 u prbc so far and hb has been stable or 8 g Stool- positive for antigen but C. difficile PCR negative   S/p EGD 10/16 >showed severe hemorrhagic gastritis in the gastric body of the stomach Per GI If persistent dropin hb will need IR consult for arteriogram to rule out any source of retroperitoneal bleed but HB is stable since. Continue PPI 40 mg bid-on d/c BID x 8 wk followed  daily by 4 wk. Hospitalization complicated with bloating abdominal distention>CT abd 10/19-no bowel obstruction, cholelithiasis and sigmoid diverticulosis, small bilateral pleural effusion with large areas of consolidative changes lower lobes bilaterally, diffuse mesenteric edema, anasarca worsened since prior CT, small ascites  Having bowel movement, encouraging PT OT and diet Recent Labs  Lab 02/29/24 0255 03/01/24 0343 03/02/24 0337 03/03/24 0417 03/04/24 0431  HGB 8.3* 8.1* 8.2* 8.3* 8.0*  HCT 27.2* 27.1* 27.4* 26.8* 26.3*   AKI on CKD 3A AGMA: B/L Creat ~1.7.Peaked 2.0 creatinine has been trending up despite IV fluids.likely multifactorial w/ anasarca, hypoalbuminemia Nephrology consulted-10/21, renal ultrasound obtained no hydronephrosis. Received dose of Lasix  and albumin 10/21 No obvious hypotension and no use of nephrotoxic medication, was on Zosyn has been discontinued on completion.  Cont to monitor I/o ,renal fun as below , overall net positive balance :Net IO Since Admission: 5,825.57 mL [03/04/24 0905] Intake/Output Summary (Last 24 hours) at 03/04/2024 0905 Last data filed at 03/04/2024 0650 Gross per 24 hour  Intake 445.5 ml  Output 1300 ml  Net -854.5 ml    Recent Labs    02/24/24 1446 02/25/24 0310 02/26/24 0309 02/27/24 0805 02/28/24 0315 02/29/24 0255 03/01/24 0343 03/02/24 0337 03/03/24 0417 03/04/24 0431  BUN 21 25* 31* 22 20 20  24* 31* 37* 37*  CREATININE 1.72* 2.09* 1.92* 1.58* 1.38* 1.34* 1.42* 1.95* 2.43* 2.75*  CO2  16* 18* 20* 20* 20* 22 20* 20* 20* 20*  K 4.3 4.8 4.5 3.9 3.9 3.7 3.6 3.6 3.6 3.3*    Transaminitis-mild Hepatosplenomegaly: stable LFTs  CAP Significant  leukocytosis Imaging showed  bilateral pleural effusion with large area of consolidation-Blood CX- NGTD.Compelted zosyn day 9 on 10/21  CLL-with chronic leukocytosis not on treatment and on observation only MDS-recent diagnosis: Chronically elevated WBC count, peaked to 46k, now down in 17k.Oncology aware. Followed by Jared [name], he is on weekly ESA, due to patient's age and clinical status -bone marrow transplant was not feasible for MDS Recent Labs  Lab 02/29/24 0255 03/01/24 0343 03/02/24 0337 03/03/24 0417 03/04/24 0431  WBC 31.7* 24.7* 21.4* 17.3* 15.4*    History of hypertension Bp stable and amlodipine , imdur  and lisinopril   remains on hold since admission.   Nonobstructive CAD Mild PHTN: Cont statin.Aspirin , Imdur  remains on hold  Goals of care: Currently remains DNR.  Continue full supportive care  DVT prophylaxis: Place and maintain sequential compression device Start: 02/29/24 1126 SCDs Start: 02/24/24 1724 Code Status:   Code Status: Do not attempt resuscitation (DNR) PRE-ARREST INTERVENTIONS DESIRED Family Communication: plan of care discussed with patient and his wife at bedside 10/21, wife not at bedside today. Patient status is: Remains hospitalized because of severity of illness Level of care: Telemetry   Dispo: The patient is from: Home            Anticipated disposition: TBD  Objective: Vitals last 24 hrs: Vitals:   03/03/24 1258 03/03/24 2146 03/04/24 0500 03/04/24 0514  BP: (!) 108/50 (!) 110/57  112/63  Pulse: 75 81  78  Resp: 16 18  16   Temp: 97.9 F (36.6 C) 97.9 F (36.6 C)  98.3 F (36.8 C)  TempSrc: Oral   Oral  SpO2: 94% 93%  100%  Weight:   80 kg   Height:        Physical Examination: General exam: AAOX3 ill looking not in distress, comfortable HEENT:Oral mucosa moist, Ear/Nose WNL grossly Respiratory system: Lungs bilaterally clear to auscultation Cardiovascular system: S1 & S2 +, No JVD. Gastrointestinal system: Abdomen soft,  moderately distended,nontender, BS+ Nervous System: Alert,moving all extremities,and following commands. Extremities: extremities warm, leg edema neg Skin: No rashes,no icterus. MSK: Normal muscle bulk,tone, power   Medications reviewed:  Scheduled Meds:  atorvastatin  10 mg Oral Daily   brimonidine   1 drop Left Eye BID   dorzolamide -timolol   1 drop Left Eye BID   Influenza vac split trivalent PF  0.5 mL Intramuscular Tomorrow-1000   latanoprost   1 drop Left Eye QHS   pantoprazole  (PROTONIX ) IV  40 mg Intravenous Q12H   tamsulosin   0.4 mg Oral QHS   Continuous Infusions:   Diet: Diet Order             DIET SOFT Room service appropriate? Yes; Fluid consistency: Thin  Diet effective now                   Data Reviewed: I have personally reviewed following labs and imaging studies ( see epic result tab) CBC: Recent Labs  Lab 02/29/24 0255 03/01/24 0343 03/02/24 0337 03/03/24 0417 03/04/24 0431  WBC 31.7* 24.7* 21.4* 17.3* 15.4*  HGB 8.3* 8.1* 8.2* 8.3* 8.0*  HCT 27.2* 27.1* 27.4* 26.8* 26.3*  MCV 97.8 97.5 98.2 97.5 96.0  PLT 185 162 166 161 154   CMP: Recent Labs  Lab 02/29/24 0255 03/01/24 0343 03/02/24 0337 03/03/24 0417 03/04/24 0431  NA  137 137 135 135 136  K 3.7 3.6 3.6 3.6 3.3*  CL 105 105 103 103 103  CO2 22 20* 20* 20* 20*  GLUCOSE 80 75 77 89 82  BUN 20 24* 31* 37* 37*  CREATININE 1.34* 1.42* 1.95* 2.43* 2.75*  CALCIUM 8.4* 8.2* 8.2* 8.2* 8.2*  PHOS  --   --   --   --  2.9   GFR: Estimated Creatinine Clearance: 22.8 mL/min (A) (by C-G formula based on SCr of 2.75 mg/dL (H)). Recent Labs  Lab 03/04/24 0431  ALBUMIN 2.8*   No results for input(s): LIPASE, AMYLASE in the last 168 hours. No results for input(s): AMMONIA in the last 168 hours. Coagulation Profile:  No results for input(s): INR, PROTIME in the last 168 hours.  Unresulted Labs (From admission, onward)     Start     Ordered   03/04/24 0500  CBC  Daily,   R      Question:  Specimen collection method  Answer:  Lab=Lab collect   03/03/24 0816   03/04/24 0500  Renal function panel  Daily,   R     Question:  Specimen collection method  Answer:  Lab=Lab collect   03/03/24 1357           Antimicrobials/Microbiology: Anti-infectives (From admission, onward)    Start     Dose/Rate Route Frequency Ordered Stop   02/24/24 2200  piperacillin-tazobactam (ZOSYN) IVPB 3.375 g        3.375 g 12.5 mL/hr over 240 Minutes Intravenous Every 8 hours 02/24/24 1859 03/04/24 0140   02/24/24 1500  piperacillin-tazobactam (ZOSYN) IVPB 3.375 g        3.375 g 100 mL/hr over 30 Minutes Intravenous  Once 02/24/24 1456 02/24/24 1533         Component Value Date/Time   SDES  02/27/2024 1452    PERITONEAL Performed at [facility], [address], [place], [place] [address]    SPECREQUEST  02/27/2024 1452    NONE Performed at [facility], [address] [place], [place] [address]    CULT  02/27/2024 1452    No growth aerobically or anaerobically. Performed at [facility], [address] [place], [place] [address]    REPTSTATUS 03/03/2024 FINAL 02/27/2024 1452    Procedures: Procedure(s) (LRB): EGD (ESOPHAGOGASTRODUODENOSCOPY) (N/A)  Jared LAMY, MD [facility] 03/04/2024, 9:06 AM

## 2024-03-05 DIAGNOSIS — R578 Other shock: Secondary | ICD-10-CM | POA: Diagnosis not present

## 2024-03-05 LAB — CBC
HCT: 27.1 % — ABNORMAL LOW (ref 39.0–52.0)
Hemoglobin: 8.1 g/dL — ABNORMAL LOW (ref 13.0–17.0)
MCH: 28.6 pg (ref 26.0–34.0)
MCHC: 29.9 g/dL — ABNORMAL LOW (ref 30.0–36.0)
MCV: 95.8 fL (ref 80.0–100.0)
Platelets: 165 K/uL (ref 150–400)
RBC: 2.83 MIL/uL — ABNORMAL LOW (ref 4.22–5.81)
RDW: 24.9 % — ABNORMAL HIGH (ref 11.5–15.5)
WBC: 16.3 K/uL — ABNORMAL HIGH (ref 4.0–10.5)
nRBC: 8.9 % — ABNORMAL HIGH (ref 0.0–0.2)

## 2024-03-05 LAB — RENAL FUNCTION PANEL
Albumin: 2.7 g/dL — ABNORMAL LOW (ref 3.5–5.0)
Anion gap: 13 (ref 5–15)
BUN: 41 mg/dL — ABNORMAL HIGH (ref 8–23)
CO2: 18 mmol/L — ABNORMAL LOW (ref 22–32)
Calcium: 8.2 mg/dL — ABNORMAL LOW (ref 8.9–10.3)
Chloride: 104 mmol/L (ref 98–111)
Creatinine, Ser: 3.03 mg/dL — ABNORMAL HIGH (ref 0.61–1.24)
GFR, Estimated: 20 mL/min — ABNORMAL LOW (ref >60)
Glucose, Bld: 92 mg/dL (ref 70–99)
Phosphorus: 3 mg/dL (ref 2.5–4.6)
Potassium: 3.7 mmol/L (ref 3.5–5.1)
Sodium: 135 mmol/L (ref 135–145)

## 2024-03-05 LAB — MAGNESIUM: Magnesium: 2.5 mg/dL — ABNORMAL HIGH (ref 1.7–2.4)

## 2024-03-05 MED ORDER — SODIUM BICARBONATE 650 MG PO TABS
650.0000 mg | ORAL_TABLET | Freq: Two times a day (BID) | ORAL | Status: DC
Start: 2024-03-05 — End: 2024-03-08
  Administered 2024-03-05 – 2024-03-08 (×7): 650 mg via ORAL
  Filled 2024-03-05 (×7): qty 1

## 2024-03-05 MED ORDER — ACETAMINOPHEN 325 MG PO TABS
650.0000 mg | ORAL_TABLET | Freq: Four times a day (QID) | ORAL | Status: DC | PRN
  Administered 2024-03-05 – 2024-03-08 (×5): 650 mg via ORAL
  Filled 2024-03-05 (×5): qty 2

## 2024-03-05 MED ORDER — POTASSIUM CHLORIDE CRYS ER 20 MEQ PO TBCR
20.0000 meq | EXTENDED_RELEASE_TABLET | Freq: Once | ORAL | Status: AC
  Administered 2024-03-05: 20 meq via ORAL
  Filled 2024-03-05: qty 1

## 2024-03-05 NOTE — Progress Notes (Addendum)
 Occupational Therapy Treatment Patient Details Name: Jared [name] MRN: [id number] DOB: 1946/04/29 Today's Date: 03/05/2024   History of present illness 78 yr old male who presented to ED 02/24/24 with weakness/lethargy, and patient also having few weeks of unintentional weight loss, loss of appetite, and abdominal pain. Found to have hemorrhagic shock, large mixed density fluid collection around anterior abdominal wall-likely gastric wall or perigastric hematoma. Pt with PMH of CLL MDS HTN CKD 3a diastolic HF   OT comments  The pt was seen for ADL instruction, functional strengthening, and instruction on therapeutic exercises. He was instructed on performing a safe toilet transfer at bathroom level, for which he required min assist overall. OT recommended use of his bedside commode frame placed over his home toilet to provide an elevated transfer surface and hand rail support; the pt and his spouse verbalized understanding. The pt had increased difficulty with donning and doffing socks, stated he has difficulty in this regard at his baseline; he may benefit from instruction on use of AE to assist with lower body dressing. Lastly, he was instructed on upper body therapeutic exercises for strengthening needed to facilitate progressive ADL performance; he used a light resistance exercise band seated in the chair to perform multiple reps and 1 set of bicep curls, forward exchanges, and shoulder raises; he required SBA to occasional min assist to demo correct exercise form/technique. Continue OT plan of care. Home health OT is recommended.       If plan is discharge home, recommend the following:  Assist for transportation;Help with stairs or ramp for entrance;Assistance with cooking/housework;A little help with walking and/or transfers;A little help with bathing/dressing/bathroom   Equipment Recommendations  None recommended by OT    Recommendations for Other Services      Precautions /  Restrictions Precautions Precautions: Fall Restrictions Weight Bearing Restrictions Per Provider Order: No       Mobility Bed Mobility        General bed mobility comments: in recliner upon arrival    Transfers Overall transfer level: Needs assistance Equipment used: Rolling walker (2 wheels) Transfers: Sit to/from Stand Sit to Stand: Min assist          Balance     Sitting balance-Leahy Scale: Fair         Standing balance comment: CGA to min assist with RW            ADL either performed or assessed with clinical judgement   ADL Overall ADL's : Needs assistance/impaired            Lower Body Dressing: Total assistance;Sitting/lateral leans Lower Body Dressing Details (indicate cue type and reason): The pt was unable to doff and donn his socks seated in the chair. He reported having occasional difficulty in this regard at his baseline. OT instructed the pt on implementing the figure 4 technique attempting task, however he was unable to implement this technique. He indicated his spouse could assist him with sock management at home. Toilet Transfer: Minimal assistance;Ambulation;Rolling walker (2 wheels);Cueing for sequencing Toilet Transfer Details (indicate cue type and reason): OT instructed the pt on implementing a safe toilet transfer at bathroom level. He reported having a standard toilet at home. OT recommended use of his bedside commode frame placed over his home toilet to provide an elevated transfer surface and hand rail support. He was further instructed on best walker positioning and RLE placement (chronic knee stiffness reported) when transferring onto and off the toilet.  Communication Communication Communication: No apparent difficulties   Cognition Arousal: Alert Behavior During Therapy: WFL for tasks assessed/performed Cognition: No apparent impairments        Following commands: Intact Following commands  impaired: Only follows one step commands consistently      Cueing   Cueing Techniques: Verbal cues             Pertinent Vitals/ Pain       Pain Assessment Pain Assessment: No/denies pain   Frequency  Min 2X/week        Progress Toward Goals  OT Goals(current goals can now be found in the care plan section)  Progress towards OT goals: Progressing toward goals  Acute Rehab OT Goals OT Goal Formulation: With patient/family Time For Goal Achievement: 03/13/24 Potential to Achieve Goals: Good  Plan         AM-PAC OT 6 Clicks Daily Activity     Outcome Measure   Help from another person eating meals?: None Help from another person taking care of personal grooming?: A Little Help from another person toileting, which includes using toliet, bedpan, or urinal?: A Little Help from another person bathing (including washing, rinsing, drying)?: A Little Help from another person to put on and taking off regular upper body clothing?: None Help from another person to put on and taking off regular lower body clothing?: A Little 6 Click Score: 20    End of Session Equipment Utilized During Treatment: Gait belt;Rolling walker (2 wheels)  OT Visit Diagnosis: Unsteadiness on feet (R26.81);Other abnormalities of gait and mobility (R26.89);Muscle weakness (generalized) (M62.81)   Activity Tolerance Patient tolerated treatment well   Patient Left in chair;with call bell/phone within reach;with chair alarm set;with family/visitor present   Nurse Communication Mobility status        Time: 8879-8860 OT Time Calculation (min): 19 min  Charges: OT General Charges $OT Visit: 1 Visit OT Treatments $Self Care/Home Management : 8-22 mins     Delanna JINNY Lesches, OTR/L 03/05/2024, 12:59 PM

## 2024-03-05 NOTE — Progress Notes (Signed)
 Patient ID: Jared [name], male   DOB: May 23, 1945, 78 y.o.   MRN: [id number] [facility] Progress Note   Assessment/ Plan:   1.  Acute kidney injury on chronic kidney disease stage IIIa: Labs supportive of ATN (possibly ischemic) in the setting of previous ABLA/hemorrhagic shock.  Unclear whether there is a contributory role from renal vascular congestion based on his volume status and prior history of CHF.  Previously had a trial of albumin with furosemide  to attempt volume unloading to see if he would have improvement of renal function however, was unsuccessful.  At this time, monitor labs with what appears to be a diminishing delta rise of creatinine that seems to favor an approach to the plateau phase of ATN.  No acute electrolyte abnormality to prompt intervention. 2.  Anion gap metabolic acidosis: Mild and secondary to acute kidney injury.  Bicarbonate level trending down, I will begin him on oral sodium bicarbonate. 3.  Anemia: He has underlying CLL and myelodysplastic syndrome and suffered acute on chronic with recent evidence of acute blood loss (perigastric versus gastric wall). 4.  Hypokalemia: Exacerbated by diuretic induced losses, status post oral replacement. 5.  Possible community-acquired pneumonia: Status post intravenous Zosyn therapy.  Blood cultures negative to date.  Subjective:   No acute events overnight.  Specifically denies chest pain or shortness of breath   Objective:   BP 113/63 (BP Location: Right Arm)   Pulse 82   Temp 98.4 F (36.9 C) (Oral)   Resp 18   Ht 5' 11 (1.803 m)   Wt 78.2 kg   SpO2 94%   BMI 24.04 kg/m   Intake/Output Summary (Last 24 hours) at 03/05/2024 1215 Last data filed at 03/05/2024 0900 Gross per 24 hour  Intake 240 ml  Output 700 ml  Net -460 ml   Weight change: -1.8 kg  Physical Exam: Gen: Comfortably sitting up in recliner, family members including wife at bedside CVS: Pulse regular rhythm, normal rate, S1 and S2  normal Resp: Clear to auscultation, no rales/rhonchi Abd: Soft, obese, mild tenderness over epigastric area Ext: 2+ lower extremity edema  Imaging: DG Abd 1 View Result Date: 03/04/2024 CLINICAL DATA:  Abdominal distension. EXAM: ABDOMEN - 1 VIEW COMPARISON:  Radiograph dated 02/27/2024. FINDINGS: No bowel dilatation or evidence of obstruction. Air and contrast noted in the colon. Degenerative changes spine. Total left hip arthroplasty. No acute osseous pathology. IMPRESSION: Nonobstructive bowel gas pattern. Electronically Signed   By: Vanetta Chou M.D.   On: 03/04/2024 18:35   US  RENAL Result Date: 03/03/2024 CLINICAL DATA:  Renal failure. EXAM: RENAL / URINARY TRACT ULTRASOUND COMPLETE COMPARISON:  None Available. FINDINGS: Right Kidney: Renal measurements: 10.0 cm x 5.3 cm x 5.2 cm = volume: 142.6 mL. There is diffusely increased echogenicity of the renal parenchyma. A 2.3 cm x 2.1 cm x 1.6 cm simple right renal cyst is seen within the mid right kidney. No hydronephrosis is visualized. Left Kidney: Renal measurements: 10.3 cm x 4.6 cm x 5.1 cm = volume: 125.1 mL. There is diffusely increased echogenicity of the renal parenchyma. A 1.9 cm x 1.7 cm x 1.6 cm simple left renal cyst is seen with the mid to lower left kidney. No hydronephrosis is visualized. Bladder: Appears normal for degree of bladder distention. The bilateral ureteral jets are not visualized peer Other: There is a moderate amount of ascites. IMPRESSION: 1. Bilateral echogenic kidneys which likely represents sequelae associated with medical renal disease. 2. Bilateral simple renal cysts.  3. Ascites. Electronically Signed   By: Suzen Dials M.D.   On: 03/03/2024 18:03    Labs: BMET Recent Labs  Lab 02/28/24 0315 02/29/24 0255 03/01/24 0343 03/02/24 0337 03/03/24 0417 03/04/24 0431 03/05/24 0420  NA 139 137 137 135 135 136 135  K 3.9 3.7 3.6 3.6 3.6 3.3* 3.7  CL 106 105 105 103 103 103 104  CO2 20* 22 20* 20* 20*  20* 18*  GLUCOSE 81 80 75 77 89 82 92  BUN 20 20 24* 31* 37* 37* 41*  CREATININE 1.38* 1.34* 1.42* 1.95* 2.43* 2.75* 3.03*  CALCIUM 8.4* 8.4* 8.2* 8.2* 8.2* 8.2* 8.2*  PHOS  --   --   --   --   --  2.9 3.0   CBC Recent Labs  Lab 03/02/24 0337 03/03/24 0417 03/04/24 0431 03/05/24 0420  WBC 21.4* 17.3* 15.4* 16.3*  HGB 8.2* 8.3* 8.0* 8.1*  HCT 27.4* 26.8* 26.3* 27.1*  MCV 98.2 97.5 96.0 95.8  PLT 166 161 154 165   Medications:     atorvastatin  10 mg Oral Daily   brimonidine   1 drop Left Eye BID   dorzolamide -timolol   1 drop Left Eye BID   Influenza vac split trivalent PF  0.5 mL Intramuscular Tomorrow-1000   latanoprost   1 drop Left Eye QHS   pantoprazole  (PROTONIX ) IV  40 mg Intravenous Q12H   potassium chloride   20 mEq Oral Once   tamsulosin   0.4 mg Oral QHS   Gordy Blanch, MD 03/05/2024, 12:15 PM

## 2024-03-05 NOTE — Progress Notes (Signed)
 Pt had 5bts run [employer]. Pt asymptomatic vital signs and EKG completed. Provider notified. SABRA

## 2024-03-05 NOTE — Progress Notes (Signed)
 Mobility Specialist - Progress Note   03/05/24 1529  Mobility  Activity Ambulated with assistance  Level of Assistance Minimal assist, patient does 75% or more  Assistive Device Front wheel walker  Distance Ambulated (ft) 50 ft  Range of Motion/Exercises Active  Activity Response Tolerated well  Mobility Referral Yes  Mobility visit 1 Mobility  Mobility Specialist Start Time (ACUTE ONLY) 1515  Mobility Specialist Stop Time (ACUTE ONLY) 1529  Mobility Specialist Time Calculation (min) (ACUTE ONLY) 14 min   Pt was found in bed and agreeable to mobilize. Grew fatigued with session. At EOS returned to bed with all needs met. Call bell in reach.   [name],  Mobility Specialist Can be reached via Secure Chat

## 2024-03-05 NOTE — Progress Notes (Signed)
 PROGRESS NOTE    Jared [name]  FMW:[id number] DOB: [birth year] DOA: 02/24/2024 PCP: [name], FNP    Brief Narrative:  Jared [name] is a 78 y.o. male with PMH of CLL MDS HTN CKD 3a diastolic HF who presented to ED 02/24/24 w/ complain of weakness/lethargy, and patient also having few weeks of unintentional weight loss loss of appetite and abdominal pain. Patient was hypotensive pre-hospital. In ED anemic hgb 5.6, + fecal occult, LA 5.6 and SBP low 90s. CT a/p with contrast shows a possible gastric wall / perigastric hematoma or other complex fluid collection. Also BLL (L>R) ASD,ascites, splenomegaly and hepatomegaly. GI, general surgery consulted and admitted to ICU.  Transfused 2 units of PRBC on admission and again 2 u on 10/15 S/p EGD 10/16 >showed severe hemorrhagic gastritis Hemoglobin stabilized since, tolerating diet transfer out of ICU 10/18 10/21: Creatinine is slowly worsening, nephrology consulted-workup in place     Assessment and Plan: ABLA W/ Hemorrhagic shock Large mixed density fluid collection around anterior abdominal wall-likely gastric wall or perigastric hematoma Large volume ascites Severe hemorrhagic gastritis per EGD Loose Dark stool: GI and surgery following/Upper GI series did not show evidence of perforation, ultrasound paracentesis 10/16: s/p 1.2 L peritoneal fluid  S/p 4 u prbc so far and hb has been stable  Stool- positive for antigen but C. difficile PCR negative  S/p EGD 10/16 >showed severe hemorrhagic gastritis in the gastric body of the stomach Per GI If persistent dropin hb will need IR consult for arteriogram to rule out any source of retroperitoneal bleed but HB is stable since. Continue PPI 40 mg bid-on d/c BID x 8 wk followed  daily by 4 wk. Hospitalization complicated with bloating abdominal distention>CT abd 10/19-no bowel obstruction, cholelithiasis and sigmoid diverticulosis, small bilateral pleural effusion with large areas of  consolidative changes lower lobes bilaterally, diffuse mesenteric edema, anasarca worsened since prior CT, small ascites    AKI on CKD 3A AGMA: B/L Creat ~1.7.Peaked 2.0 creatinine has been trending up despite IV fluids.likely multifactorial w/ anasarca, hypoalbuminemia Nephrology consulted-10/21, renal ultrasound obtained no hydronephrosis. Received dose of Lasix  and albumin 10/21 No obvious hypotension and no use of nephrotoxic medication, was on Zosyn has been discontinued on completion.   Transaminitis-mild Hepatosplenomegaly: stable LFTs  CAP Significant leukocytosis Imaging showed  bilateral pleural effusion with large area of consolidation-Blood CX- NGTD.Compelted zosyn day 9 on 10/21  CLL-with chronic leukocytosis not on treatment and on observation only MDS-recent diagnosis: Chronically elevated WBC count, peaked to 46k, now down in 17k.Oncology aware. Followed by Dr. Loretha, he is on weekly ESA, due to patient's age and clinical status -bone marrow transplant was not feasible for MDS   History of hypertension Bp stable --amlodipine , imdur  and lisinopril   remains on hold since admission.   Nonobstructive CAD Mild PHTN: Cont statin.Aspirin , Imdur  remains on hold  Goals of care: Currently remains DNR.  Continue full supportive care   DVT prophylaxis: Place and maintain sequential compression device Start: 02/29/24 1126 SCDs Start: 02/24/24 1724    Code Status: Do not attempt resuscitation (DNR) PRE-ARREST INTERVENTIONS DESIRED Family Communication: Wife at bedside  Disposition Plan:  Level of care: Telemetry Status is: Inpatient     Consultants:  GI Renal General Surgery   Subjective: Does not have much of an appetite  Objective: Vitals:   03/04/24 1316 03/05/24 0434 03/05/24 0440 03/05/24 0554  BP: 103/64 (!) 105/58  113/63  Pulse: 82 83  82  Resp: 18 18  Temp: 97.7 F (36.5 C) 98.2 F (36.8 C)  98.4 F (36.9 C)  TempSrc: Oral Oral  Oral   SpO2: 95% 95%  94%  Weight:   78.2 kg   Height:        Intake/Output Summary (Last 24 hours) at 03/05/2024 1116 Last data filed at 03/05/2024 0900 Gross per 24 hour  Intake 360 ml  Output 700 ml  Net -340 ml   Filed Weights   03/03/24 0500 03/04/24 0500 03/05/24 0440  Weight: 78.3 kg 80 kg 78.2 kg    Examination:   General: Appearance:    Well developed, well nourished male in no acute distress     Lungs:     respirations unlabored  Heart:    Normal heart rate. +LE edema    MS:   All extremities are intact.    Neurologic:   Awake, alert       Data Reviewed: I have personally reviewed following labs and imaging studies  CBC: Recent Labs  Lab 03/01/24 0343 03/02/24 0337 03/03/24 0417 03/04/24 0431 03/05/24 0420  WBC 24.7* 21.4* 17.3* 15.4* 16.3*  HGB 8.1* 8.2* 8.3* 8.0* 8.1*  HCT 27.1* 27.4* 26.8* 26.3* 27.1*  MCV 97.5 98.2 97.5 96.0 95.8  PLT 162 166 161 154 165   Basic Metabolic Panel: Recent Labs  Lab 03/01/24 0343 03/02/24 0337 03/03/24 0417 03/04/24 0431 03/05/24 0420  NA 137 135 135 136 135  K 3.6 3.6 3.6 3.3* 3.7  CL 105 103 103 103 104  CO2 20* 20* 20* 20* 18*  GLUCOSE 75 77 89 82 92  BUN 24* 31* 37* 37* 41*  CREATININE 1.42* 1.95* 2.43* 2.75* 3.03*  CALCIUM 8.2* 8.2* 8.2* 8.2* 8.2*  MG  --   --   --   --  2.5*  PHOS  --   --   --  2.9 3.0   GFR: Estimated Creatinine Clearance: 20.7 mL/min (A) (by C-G formula based on SCr of 3.03 mg/dL (H)). Liver Function Tests: Recent Labs  Lab 03/04/24 0431 03/05/24 0420  ALBUMIN 2.8* 2.7*   No results for input(s): LIPASE, AMYLASE in the last 168 hours. No results for input(s): AMMONIA in the last 168 hours. Coagulation Profile: No results for input(s): INR, PROTIME in the last 168 hours. Cardiac Enzymes: No results for input(s): CKTOTAL, CKMB, CKMBINDEX, TROPONINI in the last 168 hours. BNP (last 3 results) Recent Labs    04/17/23 1042  PROBNP 635*   HbA1C: No  results for input(s): HGBA1C in the last 72 hours. CBG: Recent Labs  Lab 02/29/24 0835  GLUCAP 79   Lipid Profile: No results for input(s): CHOL, HDL, LDLCALC, TRIG, CHOLHDL, LDLDIRECT in the last 72 hours. Thyroid  Function Tests: No results for input(s): TSH, T4TOTAL, FREET4, T3FREE, THYROIDAB in the last 72 hours. Anemia Panel: No results for input(s): VITAMINB12, FOLATE, FERRITIN, TIBC, IRON, RETICCTPCT in the last 72 hours. Sepsis Labs: No results for input(s): PROCALCITON, LATICACIDVEN in the last 168 hours.  Recent Results (from the past 240 hours)  Culture, blood (Routine x 2)     Status: None   Collection Time: 02/24/24  2:46 PM   Specimen: BLOOD  Result Value Ref Range Status   Specimen Description   Final    BLOOD BLOOD LEFT WRIST Performed at [facility], [address] [place], [place] [address]    Special Requests   Final    BOTTLES DRAWN AEROBIC ONLY Blood Culture results may not be optimal due  to an inadequate volume of blood received in culture bottles Performed at [facility] [address], [place], [place] [address]    Culture   Final    NO GROWTH 5 DAYS Performed at [facility], [address], [place], [place] [address]    Report Status 02/29/2024 FINAL  Final  Culture, blood (Routine x 2)     Status: None   Collection Time: 02/24/24  2:46 PM   Specimen: BLOOD LEFT FOREARM  Result Value Ref Range Status   Specimen Description   Final    BLOOD LEFT FOREARM Performed at [facility], [address] [place], [place] [address]    Special Requests   Final    BOTTLES DRAWN AEROBIC AND ANAEROBIC Blood Culture adequate volume Performed at [facility], [address], [place], [place] [address]    Culture   Final    NO GROWTH 5 DAYS Performed at [facility], [address] [place], [place] [address]    Report Status 03/01/2024 FINAL   Final  MRSA Next Gen by PCR, Nasal     Status: None   Collection Time: 02/24/24  6:39 PM   Specimen: Nasal Mucosa; Nasal Swab  Result Value Ref Range Status   MRSA by PCR Next Gen NOT DETECTED NOT DETECTED Final    Comment: (NOTE) The GeneXpert MRSA Assay (FDA approved for NASAL specimens only), is one component of a comprehensive MRSA colonization surveillance program. It is not intended to diagnose MRSA infection nor to guide or monitor treatment for MRSA infections. Test performance is not FDA approved in patients less than 42 years old. Performed at [facility], [address], [place], [place] [address]   C Difficile Quick Screen w PCR reflex     Status: Abnormal   Collection Time: 02/26/24  9:59 PM   Specimen: STOOL  Result Value Ref Range Status   C Diff antigen POSITIVE (A) NEGATIVE Final   C Diff toxin NEGATIVE NEGATIVE Final   C Diff interpretation Results are indeterminate. See PCR results.  Final    Comment: Performed at [facility], [address] [place], [place] [address]  C. Diff by PCR, Reflexed     Status: None   Collection Time: 02/26/24  9:59 PM  Result Value Ref Range Status   Toxigenic C. Difficile by PCR NEGATIVE NEGATIVE Final    Comment: Patient is colonized with non toxigenic C. difficile. May not need treatment unless significant symptoms are present.   Hypervirulent Strain PRESUMPTIVE NEGATIVE PRESUMPTIVE NEGATIVE Final    Comment: Performed at [facility], [address] [place], [place] [address]  Aerobic/Anaerobic Culture w Gram Stain (surgical/deep wound)     Status: None   Collection Time: 02/27/24  2:52 PM   Specimen: PATH Cytology Peritoneal fluid  Result Value Ref Range Status   Specimen Description   Final    PERITONEAL Performed at [facility], [address] [place], [place] [address]    Special Requests   Final    NONE Performed at [facility], [address] [place], [place] [address]    Gram Stain   Final    RARE WBC PRESENT, PREDOMINANTLY PMN NO ORGANISMS SEEN    Culture   Final    No growth aerobically or anaerobically. Performed at [facility], [address] [place], [place] [address]    Report  Status 03/03/2024 FINAL  Final         Radiology Studies: DG Abd 1 View Result Date: 03/04/2024 CLINICAL DATA:  Abdominal distension. EXAM: ABDOMEN - 1 VIEW COMPARISON:  Radiograph dated 02/27/2024. FINDINGS: No bowel dilatation or evidence of obstruction. Air and contrast noted in the colon. Degenerative changes spine. Total left hip arthroplasty. No acute osseous pathology. IMPRESSION: Nonobstructive bowel gas pattern. Electronically Signed   By: Vanetta Chou M.D.   On: 03/04/2024 18:35   US  RENAL Result Date: 03/03/2024 CLINICAL DATA:  Renal failure. EXAM: RENAL / URINARY TRACT ULTRASOUND COMPLETE COMPARISON:  None Available. FINDINGS: Right Kidney: Renal measurements: 10.0 cm x 5.3 cm x 5.2 cm = volume: 142.6 mL. There is diffusely increased echogenicity of the renal parenchyma. A 2.3 cm x 2.1 cm x 1.6 cm simple right renal cyst is seen within the mid right kidney. No hydronephrosis is visualized. Left Kidney: Renal measurements: 10.3 cm x 4.6 cm x 5.1 cm = volume: 125.1 mL. There is diffusely increased echogenicity of the renal parenchyma. A 1.9 cm x 1.7 cm x 1.6 cm simple left renal cyst is seen with the mid to lower left kidney. No hydronephrosis is visualized. Bladder: Appears normal for degree of bladder distention. The bilateral ureteral jets are not visualized peer Other: There is a moderate amount of ascites. IMPRESSION: 1. Bilateral echogenic kidneys which likely represents sequelae associated with medical renal disease. 2. Bilateral simple renal cysts. 3. Ascites. Electronically Signed   By: Suzen Dials M.D.   On: 03/03/2024 18:03        Scheduled Meds:  atorvastatin  10 mg Oral Daily   brimonidine    1 drop Left Eye BID   dorzolamide -timolol   1 drop Left Eye BID   Influenza vac split trivalent PF  0.5 mL Intramuscular Tomorrow-1000   latanoprost   1 drop Left Eye QHS   pantoprazole  (PROTONIX ) IV  40 mg Intravenous Q12H   potassium chloride   20 mEq Oral Once   tamsulosin   0.4 mg Oral QHS   Continuous Infusions:   LOS: 10 days    Time spent: 45 minutes spent on chart review, discussion with nursing staff, consultants, updating family and interview/physical exam; more than 50% of that time was spent in counseling and/or coordination of care.    Harlene RAYMOND Bowl, DO [facility] Available via Epic secure chat 7am-7pm After these hours, please refer to coverage provider listed on [email or web address] 03/05/2024, 11:16 AM

## 2024-03-05 NOTE — Plan of Care (Signed)
   Problem: Education: Goal: Knowledge of General Education information will improve Description: Including pain rating scale, medication(s)/side effects and non-pharmacologic comfort measures Outcome: Progressing   Problem: Coping: Goal: Level of anxiety will decrease Outcome: Progressing   Problem: Safety: Goal: Ability to remain free from injury will improve Outcome: Progressing

## 2024-03-06 ENCOUNTER — Inpatient Hospital Stay

## 2024-03-06 DIAGNOSIS — R578 Other shock: Secondary | ICD-10-CM | POA: Diagnosis not present

## 2024-03-06 LAB — CBC
HCT: 27.3 % — ABNORMAL LOW (ref 39.0–52.0)
Hemoglobin: 8.1 g/dL — ABNORMAL LOW (ref 13.0–17.0)
MCH: 28.5 pg (ref 26.0–34.0)
MCHC: 29.7 g/dL — ABNORMAL LOW (ref 30.0–36.0)
MCV: 96.1 fL (ref 80.0–100.0)
Platelets: 172 K/uL (ref 150–400)
RBC: 2.84 MIL/uL — ABNORMAL LOW (ref 4.22–5.81)
RDW: 25.2 % — ABNORMAL HIGH (ref 11.5–15.5)
WBC: 17.1 K/uL — ABNORMAL HIGH (ref 4.0–10.5)
nRBC: 11 % — ABNORMAL HIGH (ref 0.0–0.2)

## 2024-03-06 LAB — RENAL FUNCTION PANEL
Albumin: 2.9 g/dL — ABNORMAL LOW (ref 3.5–5.0)
Anion gap: 13 (ref 5–15)
BUN: 41 mg/dL — ABNORMAL HIGH (ref 8–23)
CO2: 20 mmol/L — ABNORMAL LOW (ref 22–32)
Calcium: 8.3 mg/dL — ABNORMAL LOW (ref 8.9–10.3)
Chloride: 104 mmol/L (ref 98–111)
Creatinine, Ser: 3.2 mg/dL — ABNORMAL HIGH (ref 0.61–1.24)
GFR, Estimated: 19 mL/min — ABNORMAL LOW (ref >60)
Glucose, Bld: 82 mg/dL (ref 70–99)
Phosphorus: 3 mg/dL (ref 2.5–4.6)
Potassium: 3.6 mmol/L (ref 3.5–5.1)
Sodium: 136 mmol/L (ref 135–145)

## 2024-03-06 NOTE — Progress Notes (Signed)
 Patient ID: Jared [name], male   DOB: Feb 07, 1946, 78 y.o.   MRN: [id number] [facility] Progress Note   Assessment/ Plan:   1.  Acute kidney injury on chronic kidney disease stage IIIa: Labs supportive of ATN (possibly ischemic) in the setting of previous ABLA/hemorrhagic shock.  Unclear whether there is a contributory role from renal vascular congestion based on his volume status and prior history of CHF.  Previously had a trial of albumin with furosemide  to attempt volume unloading to see if he would have improvement of renal function however, was unsuccessful.  Labs indicate that he is possibly approaching the plateau phase of ATN and expectant monitoring will be continued with surveillance of labs and blood pressure support. 2.  Anion gap metabolic acidosis: Mild and secondary to acute kidney injury.  Bicarbonate level trending down, I will begin him on oral sodium bicarbonate. 3.  Anemia: He has underlying CLL and myelodysplastic syndrome and suffered acute on chronic with recent evidence of acute blood loss (perigastric versus gastric wall). 4.  Hypokalemia: Exacerbated by diuretic induced losses, status post oral replacement. 5.  Possible community-acquired pneumonia: Status post intravenous Zosyn therapy.  Blood cultures negative to date.  Subjective:   Denies any acute complaints at this time.  No nausea, vomiting, dysgeusia, chest pain or shortness of breath.   Objective:   BP 107/63 (BP Location: Right Arm)   Pulse 75   Temp (!) 97.5 F (36.4 C) (Oral)   Resp 16   Ht 5' 11 (1.803 m)   Wt 78.2 kg   SpO2 97%   BMI 24.04 kg/m   Intake/Output Summary (Last 24 hours) at 03/06/2024 1325 Last data filed at 03/06/2024 0800 Gross per 24 hour  Intake 240 ml  Output 750 ml  Net -510 ml   Weight change:   Physical Exam: Gen: Resting comfortably in recliner, awakens to calling his name and interacts appropriately CVS: Pulse regular rhythm, normal rate, S1 and S2  normal Resp: Clear to auscultation, no rales/rhonchi Abd: Soft, obese, mild tenderness over epigastric area Ext: 2+ lower extremity edema  Imaging: DG Abd 1 View Result Date: 03/04/2024 CLINICAL DATA:  Abdominal distension. EXAM: ABDOMEN - 1 VIEW COMPARISON:  Radiograph dated 02/27/2024. FINDINGS: No bowel dilatation or evidence of obstruction. Air and contrast noted in the colon. Degenerative changes spine. Total left hip arthroplasty. No acute osseous pathology. IMPRESSION: Nonobstructive bowel gas pattern. Electronically Signed   By: Vanetta Chou M.D.   On: 03/04/2024 18:35    Labs: BMET Recent Labs  Lab 02/29/24 0255 03/01/24 0343 03/02/24 0337 03/03/24 0417 03/04/24 0431 03/05/24 0420 03/06/24 0406  NA 137 137 135 135 136 135 136  K 3.7 3.6 3.6 3.6 3.3* 3.7 3.6  CL 105 105 103 103 103 104 104  CO2 22 20* 20* 20* 20* 18* 20*  GLUCOSE 80 75 77 89 82 92 82  BUN 20 24* 31* 37* 37* 41* 41*  CREATININE 1.34* 1.42* 1.95* 2.43* 2.75* 3.03* 3.20*  CALCIUM 8.4* 8.2* 8.2* 8.2* 8.2* 8.2* 8.3*  PHOS  --   --   --   --  2.9 3.0 3.0   CBC Recent Labs  Lab 03/03/24 0417 03/04/24 0431 03/05/24 0420 03/06/24 0406  WBC 17.3* 15.4* 16.3* 17.1*  HGB 8.3* 8.0* 8.1* 8.1*  HCT 26.8* 26.3* 27.1* 27.3*  MCV 97.5 96.0 95.8 96.1  PLT 161 154 165 172   Medications:     atorvastatin  10 mg Oral Daily  brimonidine   1 drop Left Eye BID   dorzolamide -timolol   1 drop Left Eye BID   Influenza vac split trivalent PF  0.5 mL Intramuscular Tomorrow-1000   latanoprost   1 drop Left Eye QHS   pantoprazole  (PROTONIX ) IV  40 mg Intravenous Q12H   sodium bicarbonate  650 mg Oral BID   tamsulosin   0.4 mg Oral QHS   Gordy Blanch, MD 03/06/2024, 1:25 PM

## 2024-03-06 NOTE — Progress Notes (Signed)
 Progress Note   Patient: Jared [name] FMW:[id number] DOB: April 22, 1946 DOA: 02/24/2024     11 DOS: the patient was seen and examined on 03/06/2024   Brief hospital course: Jared [name] is a 78 y.o. male with PMH of CLL MDS HTN CKD 3a diastolic HF who presented to ED 02/24/24 w/ complain of weakness/lethargy, and patient also having few weeks of unintentional weight loss loss of appetite and abdominal pain. Patient was hypotensive prehospital and given small IVF bolus. In ED anemic hgb 5.6, + fecal occult, LA 5.6 and SBP low 90s. CT a/p with contrast shows a possible gastric wall / perigastric hematoma or other complex fluid collection. Also BLL (L>R) ASD,ascites, splenomegaly and hepatomegaly. GI, general surgery consulted and admitted to ICU.  Transfused 2 units of PRBC on admission and again 2 u on 10/15 S/p EGD 10/16 >showed severe hemorrhagic gastritis Hemoglobin stabilized since, tolerating diet transfer out of ICU 10/18 10/21: Creatinine is slowly worsening, nephrology consulted, renal ultrasound obtained no hydronephrosis-received IV albumin and Lasix  x 1  Subjective: Feels okay today, no acute complaints Family updated with labs   Assessment and plan:  ABLA W/ Hemorrhagic shock Large mixed density fluid collection around anterior abdominal wall-likely gastric wall or perigastric hematoma Large volume ascites Severe hemorrhagic gastritis per EGD Loose Dark stool: GI and surgery following/Upper GI series did not show evidence of perforation, ultrasound paracentesis 10/16: s/p 1.2 L peritoneal fluid  S/p 4 u prbc so far and hb has been stable or 8 g Stool- positive for antigen but C. difficile PCR negative  S/p EGD 10/16 >showed severe hemorrhagic gastritis in the gastric body of the stomach Per GI If persistent dropin hb will need IR consult for arteriogram to rule out any source of retroperitoneal bleed but HB is stable since. Continue PPI 40 mg bid-on d/c BID x 8 wk followed   daily by 4 wk. Hospitalization complicated with bloating abdominal distention>CT abd 10/19-no bowel obstruction, cholelithiasis and sigmoid diverticulosis, small bilateral pleural effusion with large areas of consolidative changes lower lobes bilaterally, diffuse mesenteric edema, anasarca worsened since prior CT, small ascites  Having bowel movement, encouraging PT OT and diet Recent Labs  Lab 02/29/24 0255 03/01/24 0343 03/02/24 0337 03/03/24 0417 03/04/24 0431  HGB 8.3* 8.1* 8.2* 8.3* 8.0*  HCT 27.2* 27.1* 27.4* 26.8* 26.3*   AKI on CKD 3A AGMA: B/L Creat ~1.7.Peaked 2.0 creatinine has been trending up despite IV fluids.likely multifactorial w/ anasarca, hypoalbuminemia Nephrology consulted-10/21, renal ultrasound obtained no hydronephrosis. Received dose of Lasix  and albumin 10/21 No obvious hypotension and no use of nephrotoxic medication, was on Zosyn has been discontinued on completion.  Cont to monitor I/o ,renal fun as below     Transaminitis-mild Hepatosplenomegaly: stable LFTs  CAP Significant leukocytosis Imaging showed  bilateral pleural effusion with large area of consolidation-Blood CX- NGTD.Compelted zosyn day 9 on 10/21  CLL-with chronic leukocytosis not on treatment and on observation only MDS-recent diagnosis: Chronically elevated WBC count, peaked to 46k, now down in 17k.Oncology aware. Followed by Dr. Loretha, he is on weekly ESA, due to patient's age and clinical status -bone marrow transplant was not feasible for MDS   History of hypertension Bp stable and amlodipine , imdur  and lisinopril   remains on hold since admission.   Nonobstructive CAD Mild PHTN: Cont statin.Aspirin , Imdur  remains on hold  Goals of care: Currently remains DNR.  Continue full supportive care  DVT prophylaxis: Place and maintain sequential compression device Start: 02/29/24 1126 SCDs Start: 02/24/24  1724 Code Status:   Code Status: Do not attempt resuscitation (DNR) PRE-ARREST  INTERVENTIONS DESIRED Family Communication: plan of care discussed with patient and his wife at bedside 10/21, wife not at bedside today. Patient status is: Remains hospitalized because of severity of illness Level of care: Telemetry   Dispo: The patient is from: Home            Anticipated disposition: TBD  Objective: Vitals last 24 hrs: Vitals:   03/03/24 1258 03/03/24 2146 03/04/24 0500 03/04/24 0514  BP: (!) 108/50 (!) 110/57  112/63  Pulse: 75 81  78  Resp: 16 18  16   Temp: 97.9 F (36.6 C) 97.9 F (36.6 C)  98.3 F (36.8 C)  TempSrc: Oral   Oral  SpO2: 94% 93%  100%  Weight:   80 kg   Height:        Physical Examination: General exam: AAOX3 ill looking not in distress, comfortable HEENT:Oral mucosa moist, Ear/Nose WNL grossly Respiratory system: Lungs bilaterally clear to auscultation Cardiovascular system: S1 & S2 +, No JVD. Gastrointestinal system: Abdomen soft, moderately distended,nontender, BS+ Nervous System: Alert,moving all extremities,and following commands. Extremities: extremities warm, leg edema neg Skin: No rashes,no icterus. MSK: Normal muscle bulk,tone, power   CBC    Component Value Date/Time   WBC 17.1 (H) 03/06/2024 0406   RBC 2.84 (L) 03/06/2024 0406   HGB 8.1 (L) 03/06/2024 0406   HGB 9.5 (L) 05/17/2023 1216   HCT 27.3 (L) 03/06/2024 0406   HCT 30.5 (L) 12/31/2023 1602   PLT 172 03/06/2024 0406   PLT 132 (L) 05/17/2023 1216   MCV 96.1 03/06/2024 0406   MCV 102 (H) 05/17/2023 1216   MCH 28.5 03/06/2024 0406   MCHC 29.7 (L) 03/06/2024 0406   RDW 25.2 (H) 03/06/2024 0406   RDW 21.6 (H) 05/17/2023 1216   LYMPHSABS 2.6 02/24/2024 1446   LYMPHSABS 2.3 05/17/2023 1216   MONOABS 0.9 02/24/2024 1446   EOSABS 0.0 02/24/2024 1446   EOSABS 0.1 05/17/2023 1216   BASOSABS 0.3 (H) 02/24/2024 1446   BASOSABS 0.0 05/17/2023 1216   CMP     Component Value Date/Time   NA 136 03/06/2024 0406   NA 143 05/17/2023 1216   K 3.6 03/06/2024 0406   CL  104 03/06/2024 0406   CO2 20 (L) 03/06/2024 0406   GLUCOSE 82 03/06/2024 0406   BUN 41 (H) 03/06/2024 0406   BUN 22 05/17/2023 1216   CREATININE 3.20 (H) 03/06/2024 0406   CREATININE 1.74 (H) 12/31/2023 1601   CALCIUM 8.3 (L) 03/06/2024 0406   PROT 5.5 (L) 02/26/2024 0309   ALBUMIN 2.9 (L) 03/06/2024 0406   AST 46 (H) 02/26/2024 0309   AST 19 12/31/2023 1601   ALT 25 02/26/2024 0309   ALT 10 12/31/2023 1601   ALKPHOS 227 (H) 02/26/2024 0309   BILITOT 1.3 (H) 02/26/2024 0309   BILITOT 1.4 (H) 12/31/2023 1601   EGFR 50 (L) 05/17/2023 1216   GFRNONAA 19 (L) 03/06/2024 0406   GFRNONAA 39 (L) 12/31/2023 1601     Medications reviewed:  Scheduled Meds:  atorvastatin  10 mg Oral Daily   brimonidine   1 drop Left Eye BID   dorzolamide -timolol   1 drop Left Eye BID   Influenza vac split trivalent PF  0.5 mL Intramuscular Tomorrow-1000   latanoprost   1 drop Left Eye QHS   pantoprazole  (PROTONIX ) IV  40 mg Intravenous Q12H   tamsulosin   0.4 mg Oral QHS   Continuous Infusions:  Diet: Diet Order             DIET SOFT Room service appropriate? Yes; Fluid consistency: Thin  Diet effective now                   Vitals:   03/05/24 0554 03/05/24 1340 03/05/24 2035 03/06/24 0519  BP: 113/63 (!) 94/51 (!) 111/54 125/65  Pulse: 82 79 84 85  Resp:  16 19 17   Temp: 98.4 F (36.9 C) 98.3 F (36.8 C) 99.1 F (37.3 C) 98.3 F (36.8 C)  TempSrc: Oral Oral Oral Oral  SpO2: 94% 96% 95% 96%  Weight:      Height:         Disposition: Status is: Inpatient  Planned Discharge Destination: Skilled nursing facility    Time spent:  minutes  Author: Landon FORBES Baller, MD 03/06/2024 9:08 AM  For on call review [email or web address].

## 2024-03-06 NOTE — Progress Notes (Signed)
 Physical Therapy Treatment Patient Details Name: Jared [name] MRN: [id number] DOB: 1945-12-13 Today's Date: 03/06/2024   History of Present Illness 78 y.o. male who presented to ED 02/24/24 w/ complain of weakness/lethargy, and patient also having few weeks of unintentional weight loss loss of appetite and abdominal pain. Dx of hemorrhagic shock, Large mixed density fluid collection around anterior abdominal wall-likely gastric wall or perigastric hematoma. Pt with PMH of CLL MDS HTN CKD 3a diastolic HF    PT Comments  PT - Cognition Comments: AxO x 3 pleasant and willing.  Lives home with Spouse.  Amb with a walker prior.  Limited R knee flex s/p TKR Pt was OOB in recliner.  Assisted with amb in hallway.  General transfer comment: self able with good use of hands to self assist from recliner as well as control stand to sit back to recliner.  Good use of walker with turns as well. General Gait Details: tolerated a functional distance but was limited with c/o fatigue/weakness  Noted HgB 8.1 and Magnesium  2.5 today. Pt plans to return home with Spouse.  LPT has rec HH PT.    If plan is discharge home, recommend the following: A little help with walking and/or transfers;A little help with bathing/dressing/bathroom;Help with stairs or ramp for entrance;Assist for transportation;Assistance with cooking/housework   Can travel by private vehicle        Equipment Recommendations  None recommended by PT    Recommendations for Other Services       Precautions / Restrictions Precautions Precautions: Fall Recall of Precautions/Restrictions: Intact Restrictions Weight Bearing Restrictions Per Provider Order: No     Mobility  Bed Mobility               General bed mobility comments: OOB in recliner    Transfers Overall transfer level: Needs assistance Equipment used: Rolling walker (2 wheels) Transfers: Sit to/from Stand Sit to Stand: Supervision           General transfer  comment: self able with good use of hands to self assist from recliner as well as control stand to sit back to recliner.  Good use of walker with turns as well.    Ambulation/Gait Ambulation/Gait assistance: Supervision, Contact guard assist Gait Distance (Feet): 43 Feet Assistive device: Rolling walker (2 wheels) Gait Pattern/deviations: Step-through pattern, Decreased stride length Gait velocity: decreased     General Gait Details: tolerated a functional distance but was limited with c/o fatigue/weakness  Noted HgB 8.1 and Magnesium  2.5 today.   Stairs             Wheelchair Mobility     Tilt Bed    Modified Rankin (Stroke Patients Only)       Balance                                            Communication    Cognition Arousal: Alert Behavior During Therapy: WFL for tasks assessed/performed   PT - Cognitive impairments: No apparent impairments                       PT - Cognition Comments: AxO x 3 pleasant and willing.  Lives home with Spouse.  Amb with a walker prior.  Limited R knee flex s/p TKR Following commands: Intact      Cueing    Exercises  General Comments        Pertinent Vitals/Pain Pain Assessment Pain Assessment: No/denies pain    Home Living                          Prior Function            PT Goals (current goals can now be found in the care plan section) Progress towards PT goals: Progressing toward goals    Frequency    Min 2X/week      PT Plan      Co-evaluation              AM-PAC PT 6 Clicks Mobility   Outcome Measure  Help needed turning from your back to your side while in a flat bed without using bedrails?: A Little Help needed moving from lying on your back to sitting on the side of a flat bed without using bedrails?: A Little Help needed moving to and from a bed to a chair (including a wheelchair)?: A Little Help needed standing up from a chair using  your arms (e.g., wheelchair or bedside chair)?: A Little Help needed to walk in hospital room?: A Little Help needed climbing 3-5 steps with a railing? : A Lot 6 Click Score: 17    End of Session Equipment Utilized During Treatment: Gait belt Activity Tolerance: Patient limited by fatigue;Patient tolerated treatment well Patient left: in chair;with call bell/phone within reach;with family/visitor present Nurse Communication: Mobility status PT Visit Diagnosis: Other abnormalities of gait and mobility (R26.89);Muscle weakness (generalized) (M62.81)     Time: 9047-8989 PT Time Calculation (min) (ACUTE ONLY): 18 min  Charges:    $Gait Training: 8-22 mins PT General Charges $$ ACUTE PT VISIT: 1 Visit                    Katheryn Leap  PTA Acute  Rehabilitation Services Office M-F          [phone]

## 2024-03-07 DIAGNOSIS — R578 Other shock: Secondary | ICD-10-CM | POA: Diagnosis not present

## 2024-03-07 LAB — CBC
HCT: 29.5 % — ABNORMAL LOW (ref 39.0–52.0)
Hemoglobin: 8.7 g/dL — ABNORMAL LOW (ref 13.0–17.0)
MCH: 28.5 pg (ref 26.0–34.0)
MCHC: 29.5 g/dL — ABNORMAL LOW (ref 30.0–36.0)
MCV: 96.7 fL (ref 80.0–100.0)
Platelets: 187 K/uL (ref 150–400)
RBC: 3.05 MIL/uL — ABNORMAL LOW (ref 4.22–5.81)
RDW: 25.5 % — ABNORMAL HIGH (ref 11.5–15.5)
WBC: 19.3 K/uL — ABNORMAL HIGH (ref 4.0–10.5)
nRBC: 9.9 % — ABNORMAL HIGH (ref 0.0–0.2)

## 2024-03-07 LAB — RENAL FUNCTION PANEL
Albumin: 2.9 g/dL — ABNORMAL LOW (ref 3.5–5.0)
Anion gap: 14 (ref 5–15)
BUN: 39 mg/dL — ABNORMAL HIGH (ref 8–23)
CO2: 19 mmol/L — ABNORMAL LOW (ref 22–32)
Calcium: 8.5 mg/dL — ABNORMAL LOW (ref 8.9–10.3)
Chloride: 103 mmol/L (ref 98–111)
Creatinine, Ser: 2.82 mg/dL — ABNORMAL HIGH (ref 0.61–1.24)
GFR, Estimated: 22 mL/min — ABNORMAL LOW (ref >60)
Glucose, Bld: 78 mg/dL (ref 70–99)
Phosphorus: 3 mg/dL (ref 2.5–4.6)
Potassium: 3.6 mmol/L (ref 3.5–5.1)
Sodium: 136 mmol/L (ref 135–145)

## 2024-03-07 MED ORDER — ENSURE PLUS HIGH PROTEIN PO LIQD
237.0000 mL | Freq: Two times a day (BID) | ORAL | Status: DC
Start: 2024-03-08 — End: 2024-03-08
  Administered 2024-03-08: 237 mL via ORAL

## 2024-03-07 MED ORDER — ADULT MULTIVITAMIN W/MINERALS CH
1.0000 | ORAL_TABLET | Freq: Every day | ORAL | Status: DC
  Administered 2024-03-07 – 2024-03-08 (×2): 1 via ORAL
  Filled 2024-03-07 (×2): qty 1

## 2024-03-07 NOTE — Progress Notes (Signed)
 PROGRESS NOTE    Jared [name]  FMW:[id number] DOB: Nov 02, 1945 DOA: 02/24/2024 PCP: [name], FNP    Brief Narrative:   Jared [name] is a 78 y.o. male with past medical history significant for CLL/MDS, HTN, chronic diastolic congestive heart failure, CKD stage IIIa who presented to [facility] ED on 02/24/2024 with generalized abdominal pain, weakness, lethargy.  Workup in the ED, notable for hemoglobin 5.6, + FOBT, lactic acid 5.6, SBP low 90s.  CT A/P with contrast shows possible gastric wall/perigastric hematoma or other complex fluid collection, ascites, splenomegaly, hepatomegaly.  2 unit PRBC ordered.  GI consulted.  PCCM consulted for admission.  Significant hospital events: 10/13: Admit to ICU, GI/CCS consulted; transfused 2 unit PRBC 10/14: Placed on norepinephrine; weaned off 10/15: Oncology consulted; transferred to [facility], transfuse 1 unit PRBC 10/16: EGD with severe hemorrhagic gastritis in the gastric body of the stomach, no evidence of any obvious large ulcer or perforation.  Ultrasound-guided paracentesis with 1.2 L peritoneal fluid removed.  Transfused 1 unit PRBC 10/21: Nephrology consulted for worsening renal function 10/25: Creatinine climb appears now to have peaked, renal function improved; hemoglobin stable  Assessment & Plan:   Acute blood loss anemia, POA Hemorrhagic shock Anterior abdominal wall versus gastric wall versus gastric hematoma Severe hemorrhagic gastritis Patient presenting with generalized abdominal pain, weakness, lethargy and fatigue.  Hemoglobin noted 5.6 on admission with positive FOBT.  Patient was hypotensive with elevated lactic acid of 5.6.  CT abdomen/pelvis with possible gastric wall/perigastric hematoma versus complex fluid collection with associated ascites and hepatosplenomegaly.  Patient was initially admitted to the ICU under critical care service and required a short-term of vasopressors; which were eventually weaned off.  Patient was  transfused total 4 unit PRBCs.  Upper GI series with no evidence of gastric perforation or other bowel leak.  Patient was seen by general surgery; given stability of hemoglobin following transfusion no acute surgical needs were identified.  Patient underwent EGD on 02/27/2024 with findings of severe hemorrhagic gastritis in the gastric body of the stomach, no evidence of any obvious large ulcer or perforation.  Patient underwent IR ultrasound-guided paracentesis with 1.2 L of ascitic fluid removed, no growth on peritoneal culture. -- Hgb 5.6>7.2>>6.4>>9.1>>8.0>8.1>8.7 -- Protonix  40 mg IV q12h while in hospital; on dc change to 40mg  PO BID x 8 wks f/b q24h x 4 wks per GI -- CBC in am  Acute renal failure on CKD stage IIIa Anion gap metabolic acidosis Baseline creatinine 1.7.  Suspect etiology likely secondary to ATN from hemorrhagic shock as above. -- Nephrology following, appreciate assistance -- Cr 1.72>2.09>>1.34>>2.75>3.03>3.20>2.82 -- Continue to hold antihypertensives to include lisinopril  -- Sodium Bicarb 650 mg p.o. twice daily -- BMP in am  Community-acquired pneumonia Completed 9-day course of Zosyn.  Blood cultures x 2 no growth x 5 days.  On room air.  Chronic diastolic congestive heart failure HTN CAD Pulmonary hypertension BP 116/67.  At baseline on isosorbide  mononitrate 30 mg p.o. daily, lisinopril  20 mg p.o. daily, amlodipine  10 mg p.o. daily. (No longer reported to take furosemide .) -- Continue to hold amlodipine , Imdur , lisinopril  , aspirin  -- Continue atorvastatin 10 mg p.o. daily  BPH Tamsulosin  0.4 mg p.o. nightly  C. difficile colonization C. difficile antigen positive, toxin negative.  Toxigenic C. difficile PCR negative.  Consistent with colonization.  Discontinued enteric precautions.  CLL, with chronic leukocytosis not on treatment/currently on observation MDS - recent diagnosis -- Outpatient follow-up with medical oncology   DVT prophylaxis: Place and  maintain sequential compression device Start: 02/29/24 1126 SCDs Start: 02/24/24 1724    Code Status: Do not attempt resuscitation (DNR) PRE-ARREST INTERVENTIONS DESIRED Family Communication: Updated spouse present at bedside this morning  Disposition Plan:  Level of care: Telemetry Status is: Inpatient Remains inpatient appropriate because: Needs further monitoring of hemoglobin, creatinine    Consultants:  [facility] gastroenterology General Surgery Nephrology Interventional radiology  Procedures:  EGD Paracentesis  Antimicrobials:  Zosyn 10/13 - 10/21   Subjective: Patient seen examined bedside, sitting in bedside chair.  Spouse present.  Just completed a walk around the unit.  No complaints this morning.  Discussed with nephrology this morning, Dr. Tobie.  Patient's creatinine improved, down to 2.8 today.  Patient with nocomplaints or concerns at this time.  Denies headache, no dizziness, no chest pain, no palpitations, no shortness of breath, no abdominal pain, no fever/chills/night sweats, no nausea/vomiting/diarrhea.  No acute events overnight per nursing.  Objective: Vitals:   03/06/24 1158 03/06/24 1956 03/07/24 0522 03/07/24 1314  BP: 107/63 113/60 116/67 121/72  Pulse: 75 86 84 84  Resp: 16 18 18 18   Temp: (!) 97.5 F (36.4 C) 98.7 F (37.1 C) 98.4 F (36.9 C) 97.9 F (36.6 C)  TempSrc: Oral Oral Oral Oral  SpO2: 97% 94% 95% 97%  Weight:      Height:        Intake/Output Summary (Last 24 hours) at 03/07/2024 1432 Last data filed at 03/07/2024 0845 Gross per 24 hour  Intake 280 ml  Output 550 ml  Net -270 ml   Filed Weights   03/03/24 0500 03/04/24 0500 03/05/24 0440  Weight: 78.3 kg 80 kg 78.2 kg    Examination:  Physical Exam: GEN: NAD, alert and oriented x 3, chronically ill appearance HEENT: NCAT, PERRL, EOMI, sclera clear, MMM PULM: CTAB w/o wheezes/crackles, normal respiratory effort, room air CV: RRR w/o M/G/R GI: abd soft, NTND, +  BS MSK: Trace bilateral lower extremity peripheral edema, muscle strength globally intact 5/5 bilateral upper/lower extremities NEURO: CN II-XII intact, no focal deficits, sensation to light touch intact PSYCH: normal mood/affect Integumentary: dry/intact, no rashes or wounds    Data Reviewed: I have personally reviewed following labs and imaging studies  CBC: Recent Labs  Lab 03/03/24 0417 03/04/24 0431 03/05/24 0420 03/06/24 0406 03/07/24 0436  WBC 17.3* 15.4* 16.3* 17.1* 19.3*  HGB 8.3* 8.0* 8.1* 8.1* 8.7*  HCT 26.8* 26.3* 27.1* 27.3* 29.5*  MCV 97.5 96.0 95.8 96.1 96.7  PLT 161 154 165 172 187   Basic Metabolic Panel: Recent Labs  Lab 03/03/24 0417 03/04/24 0431 03/05/24 0420 03/06/24 0406 03/07/24 0436  NA 135 136 135 136 136  K 3.6 3.3* 3.7 3.6 3.6  CL 103 103 104 104 103  CO2 20* 20* 18* 20* 19*  GLUCOSE 89 82 92 82 78  BUN 37* 37* 41* 41* 39*  CREATININE 2.43* 2.75* 3.03* 3.20* 2.82*  CALCIUM 8.2* 8.2* 8.2* 8.3* 8.5*  MG  --   --  2.5*  --   --   PHOS  --  2.9 3.0 3.0 3.0   GFR: Estimated Creatinine Clearance: 22.3 mL/min (A) (by C-G formula based on SCr of 2.82 mg/dL (H)). Liver Function Tests: Recent Labs  Lab 03/04/24 0431 03/05/24 0420 03/06/24 0406 03/07/24 0436  ALBUMIN 2.8* 2.7* 2.9* 2.9*   No results for input(s): LIPASE, AMYLASE in the last 168 hours. No results for input(s): AMMONIA in the last 168 hours. Coagulation Profile: No results for input(s):  INR, PROTIME in the last 168 hours. Cardiac Enzymes: No results for input(s): CKTOTAL, CKMB, CKMBINDEX, TROPONINI in the last 168 hours. BNP (last 3 results) Recent Labs    04/17/23 1042  PROBNP 635*   HbA1C: No results for input(s): HGBA1C in the last 72 hours. CBG: No results for input(s): GLUCAP in the last 168 hours. Lipid Profile: No results for input(s): CHOL, HDL, LDLCALC, TRIG, CHOLHDL, LDLDIRECT in the last 72 hours. Thyroid  Function  Tests: No results for input(s): TSH, T4TOTAL, FREET4, T3FREE, THYROIDAB in the last 72 hours. Anemia Panel: No results for input(s): VITAMINB12, FOLATE, FERRITIN, TIBC, IRON, RETICCTPCT in the last 72 hours. Sepsis Labs: No results for input(s): PROCALCITON, LATICACIDVEN in the last 168 hours.  Recent Results (from the past 240 hours)  C Difficile Quick Screen w PCR reflex     Status: Abnormal   Collection Time: 02/26/24  9:59 PM   Specimen: STOOL  Result Value Ref Range Status   C Diff antigen POSITIVE (A) NEGATIVE Final   C Diff toxin NEGATIVE NEGATIVE Final   C Diff interpretation Results are indeterminate. See PCR results.  Final    Comment: Performed at [facility], [address], [place], [place] [address]  C. Diff by PCR, Reflexed     Status: None   Collection Time: 02/26/24  9:59 PM  Result Value Ref Range Status   Toxigenic C. Difficile by PCR NEGATIVE NEGATIVE Final    Comment: Patient is colonized with non toxigenic C. difficile. May not need treatment unless significant symptoms are present.   Hypervirulent Strain PRESUMPTIVE NEGATIVE PRESUMPTIVE NEGATIVE Final    Comment: Performed at [facility], [address] [place], [place] [address]  Aerobic/Anaerobic Culture w Gram Stain (surgical/deep wound)     Status: None   Collection Time: 02/27/24  2:52 PM   Specimen: PATH Cytology Peritoneal fluid  Result Value Ref Range Status   Specimen Description   Final    PERITONEAL Performed at [facility], [address], [place], [place] [address]    Special Requests   Final    NONE Performed at [facility], [address], [place], [place] [address]    Gram Stain   Final    RARE WBC PRESENT, PREDOMINANTLY PMN NO ORGANISMS SEEN    Culture   Final    No growth aerobically or anaerobically. Performed at [facility], [address], [place], [place] [address]    Report  Status 03/03/2024 FINAL  Final         Radiology Studies: No results found.      Scheduled Meds:  atorvastatin  10 mg Oral Daily   brimonidine   1 drop Left Eye BID   dorzolamide -timolol   1 drop Left Eye BID   Influenza vac split trivalent PF  0.5 mL Intramuscular Tomorrow-1000   latanoprost   1 drop Left Eye QHS   pantoprazole  (PROTONIX ) IV  40 mg Intravenous Q12H   sodium bicarbonate  650 mg Oral BID   tamsulosin   0.4 mg Oral QHS   Continuous Infusions:   LOS: 12 days    Time spent: 52 minutes spent on 03/07/2024 caring for this patient face-to-face including chart review, ordering labs/tests, documenting, discussion with nursing staff, consultants, updating family and interview/physical exam    Camellia PARAS Ayonna Speranza, DO [facility] Available via Epic secure chat 7am-7pm After these hours, please refer to coverage provider listed on [email or web address] 03/07/2024, 2:32 PM

## 2024-03-07 NOTE — Plan of Care (Signed)

## 2024-03-07 NOTE — Progress Notes (Signed)
 Mobility Specialist - Progress Note   03/07/24 0942  Mobility  Activity Ambulated with assistance  Level of Assistance Minimal assist, patient does 75% or more  Assistive Device Front wheel walker  Distance Ambulated (ft) 80 ft  Range of Motion/Exercises Active  Activity Response Tolerated well  Mobility Referral Yes  Mobility visit 1 Mobility  Mobility Specialist Start Time (ACUTE ONLY) [id number]  Mobility Specialist Stop Time (ACUTE ONLY) [id number]  Mobility Specialist Time Calculation (min) (ACUTE ONLY) 17 min   Pt was found in bed and agreeable to mobilize after bathroom use. Grew fatigued with hallway ambulation. At EOS returned to recliner chair with all needs met. Call bell in reach and chair alarm on. RN in room.   [name],  Mobility Specialist Can be reached via Secure Chat

## 2024-03-07 NOTE — Progress Notes (Signed)
 Initial Nutrition Assessment  DOCUMENTATION CODES:   Not applicable  INTERVENTION:   -Liberalize diet to regular for widest variety of meal selections -MVI with minerals daily -Ensure Plus High Protein po BID, each supplement provides 350 kcal and 20 grams of protein   NUTRITION DIAGNOSIS:   Increased nutrient needs related to acute illness as evidenced by estimated needs.  GOAL:   Patient will meet greater than or equal to 90% of their needs  MONITOR:   PO intake, Supplement acceptance  REASON FOR ASSESSMENT:   Consult Assessment of nutrition requirement/status  ASSESSMENT:   78 year old male with PMH CLL MDS HTN CKD 3a diastolic HF who presented to ED 02/24/24 w CC weakness/lethargy. Hypotensive prehospital and given small IVF bolus. In ED anemic hgb 5.6, + fecal occult, LA 5.6 and SBP low 90s. CT a/p with contrast shows a possible gastric wall / perigastric hematoma or other complex fluid collection. Also BLL (L>R) ASD,ascites, splenomegaly and hepatomegaly.  Pt admitted for hemorrhagic shock.  10/14- UGI reveals no evidence of perforation 10/16- s/p EGD- revealed hemorrhagic gastropathy (biopsied), full liquid diet 10/18- soft diet  Reviewed I/O's: -905 ml x 24 hours and +4.4 L since admission  UOP: 1 L x 24 hours  Patient unavailable at time of visit. Attempted to speak with patient via call to hospital room phone, however, unable to reach. RD unable to obtain further nutrition-related history or complete nutrition-focused physical exam at this time.    Per oncology notes, patient with recent diagnosis of MDS; he is not a bone marrow transplant candidate.   Patient on a soft diet. He has variable oral intake. Noted meal completions 25-75%.  Reviewed weight history; he has experienced a 6.3% weight loss over the past 6 months, which is not significant for time frame. Weight has been stable since admission. Per nursing assessment, he has mild edema, which may be  masking true weight loss as well as possible fat and muscle depletions.  Per TOC notes, plan to discharge home with home health services once medically stable. Per nephrology notes, likely plan to discharge home tomorrow (03/08/24) if renal function continues to improve.  Medications reviewed and include protonix  and sodium bicarbonate.  Labs reviewed.    Diet Order:   Diet Order             DIET SOFT Room service appropriate? Yes; Fluid consistency: Thin  Diet effective now                   EDUCATION NEEDS:   No education needs have been identified at this time  Skin:  Skin Assessment: Reviewed RN Assessment  Last BM:  03/04/24  Height:   Ht Readings from Last 1 Encounters:  02/24/24 5' 11 (1.803 m)    Weight:   Wt Readings from Last 1 Encounters:  03/05/24 78.2 kg    Ideal Body Weight:  89.1 kg  BMI:  Body mass index is 24.04 kg/m.  Estimated Nutritional Needs:   Kcal:  2150-2350  Protein:  105-120 grams  Fluid:  2.0-2.2 L    Margery ORN, RD, LDN, CDCES Registered Dietitian III Certified Diabetes Care and Education Specialist If unable to reach this RD, please use RD Inpatient group chat on secure chat between hours of 8am-4 pm daily

## 2024-03-07 NOTE — Plan of Care (Incomplete)

## 2024-03-07 NOTE — Plan of Care (Signed)
  Problem: Education: Goal: Knowledge of General Education information will improve Description: Including pain rating scale, medication(s)/side effects and non-pharmacologic comfort measures 03/07/2024 0026 by Arthur Angry, RN Outcome: Progressing 03/07/2024 0025 by Arthur Angry, RN Outcome: Progressing   Problem: Clinical Measurements: Goal: Ability to maintain clinical measurements within normal limits will improve 03/07/2024 0026 by Arthur Angry, RN Outcome: Progressing 03/07/2024 0025 by Arthur Angry, RN Outcome: Progressing   Problem: Elimination: Goal: Will not experience complications related to bowel motility Outcome: Progressing   Problem: Pain Managment: Goal: General experience of comfort will improve and/or be controlled Outcome: Progressing

## 2024-03-07 NOTE — Progress Notes (Signed)
 Patient ID: Jared [name], male   DOB: 1945-11-21, 78 y.o.   MRN: [id number] [facility] Progress Note   Assessment/ Plan:   1.  Acute kidney injury on chronic kidney disease stage IIIa: Labs supportive of ATN (possibly ischemic) in the setting of previous ABLA/hemorrhagic shock.  Unclear whether there is a contributory role from renal vascular congestion based on his volume status and prior history of CHF.  Previously had a trial of albumin with furosemide  to attempt volume unloading to see if he would have improvement of renal function however, was unsuccessful.  Today's labs show some improvement of renal function with nonoliguric urine output overnight.  Anticipate ability to discharge home tomorrow (along with restarting diuretics) if renal function continues to improve. 2.  Anion gap metabolic acidosis: Mild and secondary to acute kidney injury.  On treatment with sodium bicarbonate. 3.  Anemia: He has underlying CLL and myelodysplastic syndrome and suffered acute on chronic with recent evidence of acute blood loss (perigastric versus gastric wall). 4.  Hypokalemia: Exacerbated by diuretic induced losses, status post oral replacement. 5.  Possible community-acquired pneumonia: Completed antibiotic therapy with IV Zosyn and blood cultures negative to date.  Subjective:   Feeling well and without any focal complaints at this time   Objective:   BP 116/67 (BP Location: Left Arm)   Pulse 84   Temp 98.4 F (36.9 C) (Oral)   Resp 18   Ht 5' 11 (1.803 m)   Wt 78.2 kg   SpO2 95%   BMI 24.04 kg/m   Intake/Output Summary (Last 24 hours) at 03/07/2024 1127 Last data filed at 03/07/2024 0845 Gross per 24 hour  Intake 280 ml  Output 825 ml  Net -545 ml   Weight change:   Physical Exam: Gen: Resting comfortably in recliner, wife at bedside CVS: Pulse regular rhythm, normal rate, S1 and S2 normal Resp: Clear to auscultation, no rales/rhonchi Abd: Soft, obese, mild  tenderness over epigastric area Ext: 2+ lower extremity edema  Imaging: No results found.   Labs: BMET Recent Labs  Lab 03/01/24 0343 03/02/24 0337 03/03/24 0417 03/04/24 0431 03/05/24 0420 03/06/24 0406 03/07/24 0436  NA 137 135 135 136 135 136 136  K 3.6 3.6 3.6 3.3* 3.7 3.6 3.6  CL 105 103 103 103 104 104 103  CO2 20* 20* 20* 20* 18* 20* 19*  GLUCOSE 75 77 89 82 92 82 78  BUN 24* 31* 37* 37* 41* 41* 39*  CREATININE 1.42* 1.95* 2.43* 2.75* 3.03* 3.20* 2.82*  CALCIUM 8.2* 8.2* 8.2* 8.2* 8.2* 8.3* 8.5*  PHOS  --   --   --  2.9 3.0 3.0 3.0   CBC Recent Labs  Lab 03/04/24 0431 03/05/24 0420 03/06/24 0406 03/07/24 0436  WBC 15.4* 16.3* 17.1* 19.3*  HGB 8.0* 8.1* 8.1* 8.7*  HCT 26.3* 27.1* 27.3* 29.5*  MCV 96.0 95.8 96.1 96.7  PLT 154 165 172 187   Medications:     atorvastatin  10 mg Oral Daily   brimonidine   1 drop Left Eye BID   dorzolamide -timolol   1 drop Left Eye BID   Influenza vac split trivalent PF  0.5 mL Intramuscular Tomorrow-1000   latanoprost   1 drop Left Eye QHS   pantoprazole  (PROTONIX ) IV  40 mg Intravenous Q12H   sodium bicarbonate  650 mg Oral BID   tamsulosin   0.4 mg Oral QHS   Gordy Blanch, MD 03/07/2024, 11:27 AM

## 2024-03-08 DIAGNOSIS — R578 Other shock: Secondary | ICD-10-CM | POA: Diagnosis not present

## 2024-03-08 LAB — RENAL FUNCTION PANEL
Albumin: 2.9 g/dL — ABNORMAL LOW (ref 3.5–5.0)
Anion gap: 15 (ref 5–15)
BUN: 36 mg/dL — ABNORMAL HIGH (ref 8–23)
CO2: 19 mmol/L — ABNORMAL LOW (ref 22–32)
Calcium: 8.4 mg/dL — ABNORMAL LOW (ref 8.9–10.3)
Chloride: 103 mmol/L (ref 98–111)
Creatinine, Ser: 2.62 mg/dL — ABNORMAL HIGH (ref 0.61–1.24)
GFR, Estimated: 24 mL/min — ABNORMAL LOW (ref >60)
Glucose, Bld: 67 mg/dL — ABNORMAL LOW (ref 70–99)
Phosphorus: 3.2 mg/dL (ref 2.5–4.6)
Potassium: 3.9 mmol/L (ref 3.5–5.1)
Sodium: 136 mmol/L (ref 135–145)

## 2024-03-08 LAB — CBC
HCT: 29.8 % — ABNORMAL LOW (ref 39.0–52.0)
Hemoglobin: 9.1 g/dL — ABNORMAL LOW (ref 13.0–17.0)
MCH: 29.3 pg (ref 26.0–34.0)
MCHC: 30.5 g/dL (ref 30.0–36.0)
MCV: 95.8 fL (ref 80.0–100.0)
Platelets: 189 K/uL (ref 150–400)
RBC: 3.11 MIL/uL — ABNORMAL LOW (ref 4.22–5.81)
RDW: 25.8 % — ABNORMAL HIGH (ref 11.5–15.5)
WBC: 23.4 K/uL — ABNORMAL HIGH (ref 4.0–10.5)
nRBC: 8.1 % — ABNORMAL HIGH (ref 0.0–0.2)

## 2024-03-08 MED ORDER — SIMETHICONE 80 MG PO CHEW: 80.0000 mg | CHEWABLE_TABLET | Freq: Four times a day (QID) | ORAL | 0 refills | Status: DC | PRN

## 2024-03-08 MED ORDER — PANTOPRAZOLE SODIUM 40 MG PO TBEC
DELAYED_RELEASE_TABLET | ORAL | 0 refills | Status: DC
Start: 2024-03-08 — End: 2024-04-01

## 2024-03-08 MED ORDER — FUROSEMIDE 20 MG PO TABS
20.0000 mg | ORAL_TABLET | Freq: Every day | ORAL | Status: DC
  Administered 2024-03-08: 20 mg via ORAL
  Filled 2024-03-08: qty 1

## 2024-03-08 MED ORDER — SODIUM BICARBONATE 650 MG PO TABS: 650.0000 mg | ORAL_TABLET | Freq: Two times a day (BID) | ORAL | 0 refills | Status: DC

## 2024-03-08 MED ORDER — SIMETHICONE 80 MG PO CHEW
80.0000 mg | CHEWABLE_TABLET | Freq: Four times a day (QID) | ORAL | Status: DC | PRN
  Administered 2024-03-08: 80 mg via ORAL
  Filled 2024-03-08: qty 1

## 2024-03-08 NOTE — Discharge Summary (Signed)
 Physician Discharge Summary  Jared [name] FMW:[id number] DOB: May 10, 1946 DOA: 02/24/2024  PCP: [name], FNP  Admit date: 02/24/2024 Discharge date: 03/08/2024  Admitted From: Home Disposition: Home  Recommendations for Outpatient Follow-up:  Follow up with PCP in 1-2 weeks Follow-up with nephrology, Dr. Tobie; clinic will call for appointment schedule Follow-up with gastroenterology, Dr. Elicia in 2-3 weeks Holding home lisinopril , Imdur , amlodipine  due to borderline blood pressures, holding Jardiance on discharge Discontinue furosemide , patient reports was not taking prior to admission Continue Protonix  40 mg p.o. twice daily x 8 weeks followed by once daily x 4 weeks Please obtain BMP/CBC in one week to ensure renal function and hemoglobin stable  Home Health: PT/OT Equipment/Devices: None  Discharge Condition: Stable CODE STATUS: Full code Diet recommendation: Heart healthy diet  History of present illness:  Jared [name] is a 78 y.o. male with past medical history significant for CLL/MDS, HTN, chronic diastolic congestive heart failure, CKD stage IIIa who presented to [facility] ED on 02/24/2024 with generalized abdominal pain, weakness, lethargy.  Workup in the ED, notable for hemoglobin 5.6, + FOBT, lactic acid 5.6, SBP low 90s.  CT A/P with contrast shows possible gastric wall/perigastric hematoma or other complex fluid collection, ascites, splenomegaly, hepatomegaly.  2 unit PRBC ordered.  GI consulted.  PCCM consulted for admission.   Significant hospital events: 10/13: Admit to ICU, GI/CCS consulted; transfused 2 unit PRBC 10/14: Placed on norepinephrine; weaned off 10/15: Oncology consulted; transferred to [facility], transfuse 1 unit PRBC 10/16: EGD with severe hemorrhagic gastritis in the gastric body of the stomach, no evidence of any obvious large ulcer or perforation.  Ultrasound-guided paracentesis with 1.2 L peritoneal fluid removed.  Transfused 1 unit PRBC 10/21:  Nephrology consulted for worsening renal function 10/25: Creatinine climb appears now to have peaked, renal function improved; hemoglobin stable  Hospital course:  Acute blood loss anemia, POA Hemorrhagic shock Anterior abdominal wall versus gastric wall versus gastric hematoma Severe hemorrhagic gastritis Patient presenting with generalized abdominal pain, weakness, lethargy and fatigue.  Hemoglobin noted 5.6 on admission with positive FOBT.  Patient was hypotensive with elevated lactic acid of 5.6.  CT abdomen/pelvis with possible gastric wall/perigastric hematoma versus complex fluid collection with associated ascites and hepatosplenomegaly.  Patient was initially admitted to the ICU under critical care service and required a short-term of vasopressors; which were eventually weaned off.  Patient was transfused total 4 unit PRBCs.  Upper GI series with no evidence of gastric perforation or other bowel leak.  Patient was seen by general surgery; given stability of hemoglobin following transfusion no acute surgical needs were identified.  Patient underwent EGD on 02/27/2024 with findings of severe hemorrhagic gastritis in the gastric body of the stomach, no evidence of any obvious large ulcer or perforation.  Patient underwent IR ultrasound-guided paracentesis with 1.2 L of ascitic fluid removed, no growth on peritoneal culture.  Patient was maintained on IV Protonix  twice daily while hospitalized.  Hemoglobin stabilized, 9.1 at time of discharge.  Will continue Protonix  40 mg p.o. twice daily x 8 weeks followed by once daily x 4 weeks.  Outpatient follow-up with GI.   Acute renal failure on CKD stage IIIa Anion gap metabolic acidosis Baseline creatinine 1.7.  Suspect etiology likely secondary to ATN from hemorrhagic shock as above.  Nephrology was consulted and followed during hospital course.  Patient's antihypertensives were placed on hold.  Creatinine peaked to 3.20 and improved to 2.62 at time of  discharge.  Continue to hold home  Antivert Thomson's on discharge.  Continue sodium bicarbonate 600 mg p.o. twice daily.  Recommend repeat BMP 1 week.  Outpatient follow-up with nephrology, Dr. Tobie; clinic will call for schedule.   Community-acquired pneumonia Completed 9-day course of Zosyn.  Blood cultures x 2 no growth x 5 days.  On room air.   Chronic diastolic congestive heart failure HTN CAD Pulmonary hypertension BP 116/67.  At baseline on isosorbide  mononitrate 30 mg p.o. daily, lisinopril  20 mg p.o. daily, amlodipine  10 mg p.o. daily. (No longer reported to take furosemide .) Continue to hold amlodipine , Imdur , lisinopril  on discharge.  Continue atorvastatin.   BPH Tamsulosin  0.4 mg p.o. nightly   C. difficile colonization C. difficile antigen positive, toxin negative.  Toxigenic C. difficile PCR negative.  Consistent with colonization.  Discontinued enteric precautions.   CLL, with chronic leukocytosis not on treatment/currently on observation MDS - recent diagnosis Outpatient follow-up with medical oncology  Discharge Diagnoses:  Principal Problem:   Hemorrhagic shock (HCC) Active Problems:   Severe gastritis    Discharge Instructions  Discharge Instructions     Call MD for:  difficulty breathing, headache or visual disturbances   Complete by: As directed    Call MD for:  extreme fatigue   Complete by: As directed    Call MD for:  persistant dizziness or light-headedness   Complete by: As directed    Call MD for:  persistant nausea and vomiting   Complete by: As directed    Call MD for:  severe uncontrolled pain   Complete by: As directed    Call MD for:  temperature >100.4   Complete by: As directed    Diet - low sodium heart healthy   Complete by: As directed    Increase activity slowly   Complete by: As directed       Allergies as of 03/08/2024   No Known Allergies      Medication List     PAUSE taking these medications    amLODipine  10 MG  tablet Wait to take this until your doctor or other care provider tells you to start again. Commonly known as: NORVASC  Take 10 mg by mouth daily.   isosorbide  mononitrate 30 MG 24 hr tablet Wait to take this until your doctor or other care provider tells you to start again. Commonly known as: IMDUR  Take 0.5 tablets (15 mg total) by mouth daily. You can take an additional 15 mg as needed for chest pain. What changed:  when to take this additional instructions   Jardiance 10 MG Tabs tablet Wait to take this until your doctor or other care provider tells you to start again. Generic drug: empagliflozin Take 10 mg by mouth daily.   lisinopril  20 MG tablet Wait to take this until your doctor or other care provider tells you to start again. Commonly known as: ZESTRIL  Take 1 tablet (20 mg total) by mouth at bedtime.       STOP taking these medications    furosemide  20 MG tablet Commonly known as: Lasix        TAKE these medications    acetaminophen  650 MG CR tablet Commonly known as: TYLENOL  Take 1,300 mg by mouth every 8 (eight) hours as needed for pain.   Aspirin  Low Dose 81 MG tablet Generic drug: aspirin  EC TAKE 1 TABLET (81 MG TOTAL) BY MOUTH DAILY. SWALLOW WHOLE.   atorvastatin 10 MG tablet Commonly known as: LIPITOR Take 10 mg by mouth daily.   Azelastine HCl 137 MCG/SPRAY  Soln Place 1 spray into both nostrils every 12 (twelve) hours as needed (for allergies or rhinitis).   brimonidine  0.2 % ophthalmic solution Commonly known as: ALPHAGAN  Place 1 drop into the left eye 2 (two) times daily.   dorzolamide -timolol  2-0.5 % ophthalmic solution Commonly known as: COSOPT  Place 1 drop into the left eye 2 (two) times daily.   latanoprost  0.005 % ophthalmic solution Commonly known as: XALATAN  Place 1 drop into the left eye at bedtime.   lidocaine  5 % Commonly known as: LIDODERM  Place 1 patch onto the skin daily as needed (for pain- Remove & Discard patch within 12  hours or as directed by MD).   nitroGLYCERIN  0.4 MG SL tablet Commonly known as: NITROSTAT  Place 1 tablet (0.4 mg total) under the tongue every 5 (five) minutes as needed for chest pain.   pantoprazole  40 MG tablet Commonly known as: Protonix  Take 1 tablet (40 mg total) by mouth 2 (two) times daily for 56 days, THEN 1 tablet (40 mg total) daily for 28 days. Start taking on: March 08, 2024   simethicone 80 MG chewable tablet Commonly known as: MYLICON Chew 1 tablet (80 mg total) by mouth every 6 (six) hours as needed for flatulence.   sodium bicarbonate 650 MG tablet Take 1 tablet (650 mg total) by mouth 2 (two) times daily.   tamsulosin  0.4 MG Caps capsule Commonly known as: FLOMAX  Take 0.4 mg by mouth at bedtime.        Contact information for follow-up providers     Tobie Gordy POUR, MD Follow up.   Specialties: Nephrology, Vascular Surgery Why: Clinic should call for follow-up appointment Contact information: [address] [place] [address] [phone]         [name], FNP. Schedule an appointment as soon as possible for a visit in 1 week(s).   Specialty: Family Medicine Contact information: [address] [place] [address] [phone]         Elicia Claw, MD. Schedule an appointment as soon as possible for a visit.   Specialty: Gastroenterology Contact information: [address] [place] [address] [phone]              Contact information for after-discharge care     Home Medical Care     [facility]) .   Service: Home Health Services Contact information: [address] [place] [address] [place]  [address] [phone]                    No Known Allergies  Consultations: [facility] gastroenterology General Surgery Nephrology Interventional radiology   Procedures/Studies: DG Abd 1 View Result Date: 03/04/2024 CLINICAL DATA:   Abdominal distension. EXAM: ABDOMEN - 1 VIEW COMPARISON:  Radiograph dated 02/27/2024. FINDINGS: No bowel dilatation or evidence of obstruction. Air and contrast noted in the colon. Degenerative changes spine. Total left hip arthroplasty. No acute osseous pathology. IMPRESSION: Nonobstructive bowel gas pattern. Electronically Signed   By: Vanetta Chou M.D.   On: 03/04/2024 18:35   US  RENAL Result Date: 03/03/2024 CLINICAL DATA:  Renal failure. EXAM: RENAL / URINARY TRACT ULTRASOUND COMPLETE COMPARISON:  None Available. FINDINGS: Right Kidney: Renal measurements: 10.0 cm x 5.3 cm x 5.2 cm = volume: 142.6 mL. There is diffusely increased echogenicity of the renal parenchyma. A 2.3 cm x 2.1 cm x 1.6 cm simple right renal cyst is seen within the mid right kidney. No hydronephrosis  is visualized. Left Kidney: Renal measurements: 10.3 cm x 4.6 cm x 5.1 cm = volume: 125.1 mL. There is diffusely increased echogenicity of the renal parenchyma. A 1.9 cm x 1.7 cm x 1.6 cm simple left renal cyst is seen with the mid to lower left kidney. No hydronephrosis is visualized. Bladder: Appears normal for degree of bladder distention. The bilateral ureteral jets are not visualized peer Other: There is a moderate amount of ascites. IMPRESSION: 1. Bilateral echogenic kidneys which likely represents sequelae associated with medical renal disease. 2. Bilateral simple renal cysts. 3. Ascites. Electronically Signed   By: Suzen Dials M.D.   On: 03/03/2024 18:03   CT ABDOMEN PELVIS WO CONTRAST Result Date: 03/01/2024 CLINICAL DATA:  Abdominal distension. EXAM: CT ABDOMEN AND PELVIS WITHOUT CONTRAST TECHNIQUE: Multidetector CT imaging of the abdomen and pelvis was performed following the standard protocol without IV contrast. RADIATION DOSE REDUCTION: This exam was performed according to the departmental dose-optimization program which includes automated exposure control, adjustment of the mA and/or kV according to patient  size and/or use of iterative reconstruction technique. COMPARISON:  CT abdomen pelvis dated 02/24/2024. FINDINGS: Evaluation of this exam is limited in the absence of intravenous contrast as well as due to respiratory motion. Evaluation is also limited due to anasarca and streak artifact caused by left hip arthroplasty. Lower chest: Small bilateral pleural effusions. Large areas of consolidative changes involving the lower lobes bilaterally may represent atelectasis or pneumonia and progressed since the prior CT. No intra-abdominal free air. Diffuse mesenteric edema and small ascites. Hepatobiliary: The liver is grossly unremarkable. No biliary dilatation. Gallstone. Vicarious excretion of contrast also noted in the gallbladder. Pancreas: Unremarkable. No pancreatic ductal dilatation or surrounding inflammatory changes. Spleen: Normal in size without focal abnormality. Adrenals/Urinary Tract: The adrenal glands unremarkable. There is no hydronephrosis or nephrolithiasis on either side. Bilateral perinephric stranding, nonspecific. The urinary bladder is suboptimally evaluated due to streak artifact caused by left hip arthroplasty. Stomach/Bowel: Oral contrast noted throughout the bowel and colon. There is sigmoid diverticulosis. No bowel obstruction. Normal appendix. Vascular/Lymphatic: Moderate aortoiliac atherosclerotic disease. The IVC is grossly unremarkable. No portal venous gas. No obvious adenopathy. Reproductive: The prostate gland is poorly visualized. Other: Diffuse mesenteric and subcutaneous edema and anasarca, worsened since the prior CT. Musculoskeletal: Osteopenia with degenerative changes. Total left hip arthroplasty. No acute osseous pathology. IMPRESSION: 1. No bowel obstruction. Normal appendix. 2. Sigmoid diverticulosis. 3. Cholelithiasis. 4. Small bilateral pleural effusions with large areas of consolidative changes involving the lower lobes bilaterally may represent atelectasis or pneumonia and  progressed since the prior CT. 5. Anasarca, worsened since the prior CT. 6.  Aortic Atherosclerosis (ICD10-I70.0). Electronically Signed   By: Vanetta Chou M.D.   On: 03/01/2024 15:51   US  Paracentesis Result Date: 02/27/2024 INDICATION: Therapeutic and diagnostic removal of ascites fluid. EXAM: ULTRASOUND GUIDED right-sided PARACENTESIS MEDICATIONS: None. COMPLICATIONS: None immediate. PROCEDURE: Informed written consent was obtained from the patient after a discussion of the risks, benefits and alternatives to treatment. A timeout was performed prior to the initiation of the procedure. Initial ultrasound scanning demonstrates a moderate amount of ascites within the right lower abdominal quadrant. The right lower abdomen was prepped and draped in the usual sterile fashion. 1% lidocaine  was used for local anesthesia. Following this, a 19 gauge 7 cm Yueh catheter was introduced. An ultrasound image was saved for documentation purposes. The paracentesis was performed. The catheter was removed and a dressing was applied. The patient tolerated the procedure well without  immediate post procedural complication. FINDINGS: A total of approximately 1.2 of sanguinous fluid was removed. Samples were sent to the laboratory as requested by the clinical team. IMPRESSION: Successful ultrasound-guided paracentesis yielding 1.2 liters of peritoneal fluid. Electronically Signed   By: Cordella Banner   On: 02/27/2024 16:33   DG Abd Portable 1V Result Date: 02/27/2024 CLINICAL DATA:  13914 Abdominal distention 13914 EXAM: PORTABLE ABDOMEN - 1 VIEW COMPARISON:  02/25/2024 FINDINGS: Nonobstructive bowel gas pattern.Enteric contrast noted in the decompressed colon.No pneumoperitoneum. No organomegaly or radiopaque calculi. No acute fracture or destructive lesion. 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 IMPRESSION: Nonobstructive bowel gas pattern. Electronically Signed    By: Rogelia Myers M.D.   On: 02/27/2024 08:53   DG UGI W SINGLE CM (SOL OR THIN BA) Result Date: 02/25/2024 CLINICAL DATA:  Abdominal pain and distension with symptomatic anemia. Gastric wall thickening versus perigastric hematoma on CT. Evaluate for perforation or ulcer. EXAM: WATER SOLUBLE UPPER GI SERIES TECHNIQUE: Single-column upper GI series was performed using water soluble contrast. Radiation Exposure Index (as provided by the fluoroscopic device): 19.8 mGy Kerma CONTRAST:  100 cc Omnipaque  300 orally COMPARISON:  Abdominopelvic CT 02/24/2024 FINDINGS: The scout abdominal radiograph demonstrates a nonobstructive bowel gas pattern. There are degenerative changes throughout the thoracolumbar spine. Left total hip arthroplasty noted. Limited water soluble upper GI series was performed in the semi erect and right lateral decubitus positions. The patient drank the contrast without difficulty. There is mild esophageal dysmotility without evidence of stricture, mucosal ulceration or significant hiatal hernia. On incomplete gastric filling, no focal mucosal abnormalities are identified. There is no evidence of gastric perforation or gastric outlet obstruction. In the right lateral decubitus position, there is normal emptying of the stomach into the duodenum. The duodenum appears normal. The ligament of Treitz is normally positioned. Spontaneous gastroesophageal reflux to the aortic arch was noted during the examination. IMPRESSION: 1. Limited water-soluble upper GI series demonstrates no evidence of gastric perforation or other bowel leak. No focal mucosal abnormalities are identified. 2. Moderate spontaneous gastroesophageal reflux. 3. Upper endoscopy should be considered for further gastric mucosal assessment. Electronically Signed   By: Elsie Perone M.D.   On: 02/25/2024 15:39   CT Head Wo Contrast Result Date: 02/24/2024 CLINICAL DATA:  Fall, blunt trauma EXAM: CT HEAD WITHOUT CONTRAST TECHNIQUE:  Contiguous axial images were obtained from the base of the skull through the vertex without intravenous contrast. RADIATION DOSE REDUCTION: This exam was performed according to the departmental dose-optimization program which includes automated exposure control, adjustment of the mA and/or kV according to patient size and/or use of iterative reconstruction technique. COMPARISON:  None Available. FINDINGS: Brain: No acute intracranial abnormality. Specifically, no hemorrhage, hydrocephalus, mass lesion, acute infarction, or significant intracranial injury. Vascular: No hyperdense vessel or unexpected calcification. Skull: No acute calvarial abnormality. Sinuses/Orbits: No acute findings Other: None IMPRESSION: No acute intracranial abnormality. Electronically Signed   By: Franky Crease M.D.   On: 02/24/2024 17:12   CT ABDOMEN PELVIS W CONTRAST Result Date: 02/24/2024 CLINICAL DATA:  Nausea, vomiting, distended abdomen.  Recent fall. EXAM: CT ABDOMEN AND PELVIS WITH CONTRAST TECHNIQUE: Multidetector CT imaging of the abdomen and pelvis was performed using the standard protocol following bolus administration of intravenous contrast. RADIATION DOSE REDUCTION: This exam was performed according to the departmental dose-optimization program which includes automated exposure control, adjustment of the mA and/or kV according to patient size and/or use of iterative reconstruction technique. CONTRAST:  OMNIPAQUE  IOHEXOL  300 MG/ML  SOLN COMPARISON:  03/27/2018 FINDINGS: Lower chest: Airspace disease noted in both lower lungs, most pronounced in the left lower lobe concerning for pneumonia. No effusions. Cardiomegaly. Coronary artery disease. Hepatobiliary: Heterogeneous appearance of the liver which appears enlarged. No focal hepatic abnormality. Small gallstone within the gallbladder which is otherwise unremarkable. Pancreas: No focal abnormality or ductal dilatation. Spleen: Spleen is enlarged with a craniocaudal  length of 13.5 cm. No focal abnormality. Adrenals/Urinary Tract: Bilateral renal cysts appear benign. No follow-up imaging recommended. No suspicious renal or adrenal abnormality. No hydronephrosis. Urinary bladder unremarkable. Stomach/Bowel: Stomach, large and small bowel grossly unremarkable. Vascular/Lymphatic: Aortic atherosclerosis. No evidence of aneurysm or adenopathy. Reproductive: Obscured by beam hardening artifact from left hip replacement. Other: Large volume ascites in the abdomen and pelvis. There appears to be a mixed density fluid collection noted along the anterior gastric wall which could reflect a gastric wall or perigastric hematoma measuring 11.1 x 3.5 cm. Musculoskeletal: No acute bony abnormality. IMPRESSION: Heterogeneous enhancement throughout the liver with hepatomegaly. Splenomegaly. Large volume ascites throughout the abdomen and pelvis. There appears to be a complex fluid collection with mass effect anterior to the stomach concerning for possible gastric wall or perigastric hematoma. Patchy airspace opacities in the lower lungs bilaterally, left greater than right concerning for pneumonia. Coronary artery disease, aortic atherosclerosis. Electronically Signed   By: Franky Crease M.D.   On: 02/24/2024 17:11   CT Cervical Spine Wo Contrast Result Date: 02/24/2024 CLINICAL DATA:  Fall, blunt trauma EXAM: CT CERVICAL SPINE WITHOUT CONTRAST TECHNIQUE: Multidetector CT imaging of the cervical spine was performed without intravenous contrast. Multiplanar CT image reconstructions were also generated. RADIATION DOSE REDUCTION: This exam was performed according to the departmental dose-optimization program which includes automated exposure control, adjustment of the mA and/or kV according to patient size and/or use of iterative reconstruction technique. COMPARISON:  None Available. FINDINGS: Alignment: Mild reversal the normal cervical lordosis. 3 mm of grade 1 degenerative anterolisthesis at  C7-T1. Skull base and vertebrae: Anterior spurring at the C1-2 articulation. Bilateral degenerative sternoclavicular arthropathy. No cervical spine fracture or acute bony findings identified. Soft tissues and spinal canal: Right common carotid atheromatous vascular calcification. Disc levels: C2-3: Prominent left foraminal stenosis due to uncinate and facet spurring. C3-4: Prominent bilateral foraminal stenosis and moderate central stenosis due to calcified central disc protrusion, uncinate spurring, and short pedicles. C4-5: Prominent bilateral foraminal stenosis and moderate central stenosis due to disc bulge, uncinate spurring, and short pedicles. Possible shallow left paracentral disc protrusion. C5-6: Moderate to prominent bilateral foraminal stenosis due to uncinate and facet spurring and short pedicles. C6-7: Moderate to prominent left and moderate right foraminal stenosis with borderline central stenosis due to disc osteophyte complex and uncinate spurring. C7-T1: Moderate bilateral foraminal stenosis due to disc uncovering and facet arthropathy. Upper chest: Mild biapical pleuroparenchymal scarring. Other: No supplemental non-categorized findings. IMPRESSION: 1. No acute cervical spine findings. 2. Substantial multilevel impingement due to cervical spondylosis, degenerative disc disease, and short pedicles. 3. Bilateral degenerative sternoclavicular arthropathy. 4. Right common carotid atheromatous vascular calcification. Electronically Signed   By: Ryan Salvage M.D.   On: 02/24/2024 16:43     Subjective: Patient seen examined bedside, sitting in bedside chair.  Daughter and spouse present at bedside.  Reports some mild gas pain.  Otherwise doing well.  Discharging home today.  No other questions or concerns at this time.  Denies headache, no dizziness, no chest pain, no palpitations, no shortness of breath,  no abdominal pain, no fever/chills/night sweats, no nausea/vomiting/diarrhea.  No acute  events overnight per nursing staff.  Discharge Exam: Vitals:   03/08/24 0431 03/08/24 0900  BP: 117/68   Pulse: 86   Resp: 20   Temp: 98.8 F (37.1 C)   SpO2: 94% 100%   Vitals:   03/07/24 1314 03/07/24 2004 03/08/24 0431 03/08/24 0900  BP: 121/72 116/63 117/68   Pulse: 84 88 86   Resp: 18 17 20    Temp: 97.9 F (36.6 C) 98.5 F (36.9 C) 98.8 F (37.1 C)   TempSrc: Oral Oral Oral   SpO2: 97% 96% 94% 100%  Weight:   77.2 kg   Height:        Physical Exam: GEN: NAD, alert and oriented x 3, chronically ill appearance HEENT: NCAT, PERRL, EOMI, sclera clear, MMM PULM: CTAB w/o wheezes/crackles, normal respiratory effort, room air CV: RRR w/o M/G/R GI: abd soft, NTND, + BS MSK: Trace bilateral lower extremity peripheral edema, muscle strength globally intact 5/5 bilateral upper/lower extremities NEURO: CN II-XII intact, no focal deficits, sensation to light touch intact PSYCH: normal mood/affect Integumentary: dry/intact, no rashes or wounds    The results of significant diagnostics from this hospitalization (including imaging, microbiology, ancillary and laboratory) are listed below for reference.     Microbiology: Recent Results (from the past 240 hours)  Aerobic/Anaerobic Culture w Gram Stain (surgical/deep wound)     Status: None   Collection Time: 02/27/24  2:52 PM   Specimen: PATH Cytology Peritoneal fluid  Result Value Ref Range Status   Specimen Description   Final    PERITONEAL Performed at [facility], [address] [place], [place] [address]    Special Requests   Final    NONE Performed at [facility], [address], [place], [place] [address]    Gram Stain   Final    RARE WBC PRESENT, PREDOMINANTLY PMN NO ORGANISMS SEEN    Culture   Final    No growth aerobically or anaerobically. Performed at [facility], [address] [place], [place] [address]    Report Status 03/03/2024 FINAL  Final      Labs: BNP (last 3 results) No results for input(s): BNP in the last 8760 hours. Basic Metabolic Panel: Recent Labs  Lab 03/04/24 0431 03/05/24 0420 03/06/24 0406 03/07/24 0436 03/08/24 0444  NA 136 135 136 136 136  K 3.3* 3.7 3.6 3.6 3.9  CL 103 104 104 103 103  CO2 20* 18* 20* 19* 19*  GLUCOSE 82 92 82 78 67*  BUN 37* 41* 41* 39* 36*  CREATININE 2.75* 3.03* 3.20* 2.82* 2.62*  CALCIUM 8.2* 8.2* 8.3* 8.5* 8.4*  MG  --  2.5*  --   --   --   PHOS 2.9 3.0 3.0 3.0 3.2   Liver Function Tests: Recent Labs  Lab 03/04/24 0431 03/05/24 0420 03/06/24 0406 03/07/24 0436 03/08/24 0444  ALBUMIN 2.8* 2.7* 2.9* 2.9* 2.9*   No results for input(s): LIPASE, AMYLASE in the last 168 hours. No results for input(s): AMMONIA in the last 168 hours. CBC: Recent Labs  Lab 03/04/24 0431 03/05/24 0420 03/06/24 0406 03/07/24 0436 03/08/24 0444  WBC 15.4* 16.3* 17.1* 19.3* 23.4*  HGB 8.0* 8.1* 8.1* 8.7* 9.1*  HCT 26.3* 27.1* 27.3* 29.5* 29.8*  MCV 96.0 95.8 96.1 96.7 95.8  PLT 154 165 172 187 189   Cardiac Enzymes: No results for input(s): CKTOTAL, CKMB, CKMBINDEX, TROPONINI in the  last 168 hours. BNP: Invalid input(s): POCBNP CBG: No results for input(s): GLUCAP in the last 168 hours. D-Dimer No results for input(s): DDIMER in the last 72 hours. Hgb A1c No results for input(s): HGBA1C in the last 72 hours. Lipid Profile No results for input(s): CHOL, HDL, LDLCALC, TRIG, CHOLHDL, LDLDIRECT in the last 72 hours. Thyroid  function studies No results for input(s): TSH, T4TOTAL, T3FREE, THYROIDAB in the last 72 hours.  Invalid input(s): FREET3 Anemia work up No results for input(s): VITAMINB12, FOLATE, FERRITIN, TIBC, IRON, RETICCTPCT in the last 72 hours. Urinalysis    Component Value Date/Time   COLORURINE YELLOW 02/25/2024 0617   APPEARANCEUR CLOUDY (A) 02/25/2024 0617   LABSPEC 1.031 (H) 02/25/2024 0617   PHURINE  5.0 02/25/2024 0617   GLUCOSEU >=500 (A) 02/25/2024 0617   HGBUR NEGATIVE 02/25/2024 0617   BILIRUBINUR NEGATIVE 02/25/2024 0617   KETONESUR 5 (A) 02/25/2024 0617   PROTEINUR NEGATIVE 02/25/2024 0617   NITRITE NEGATIVE 02/25/2024 0617   LEUKOCYTESUR NEGATIVE 02/25/2024 0617   Sepsis Labs Recent Labs  Lab 03/05/24 0420 03/06/24 0406 03/07/24 0436 03/08/24 0444  WBC 16.3* 17.1* 19.3* 23.4*   Microbiology Recent Results (from the past 240 hours)  Aerobic/Anaerobic Culture w Gram Stain (surgical/deep wound)     Status: None   Collection Time: 02/27/24  2:52 PM   Specimen: PATH Cytology Peritoneal fluid  Result Value Ref Range Status   Specimen Description   Final    PERITONEAL Performed at [facility], [address] [place], [place] [address]    Special Requests   Final    NONE Performed at [facility], [address], [place], [place] [address]    Gram Stain   Final    RARE WBC PRESENT, PREDOMINANTLY PMN NO ORGANISMS SEEN    Culture   Final    No growth aerobically or anaerobically. Performed at [facility], [address] [place], [place] [address]    Report Status 03/03/2024 FINAL  Final     Time coordinating discharge: Over 30 minutes  SIGNED:   Camellia PARAS Shaden Lacher, DO  [facility] 03/08/2024, 11:12 AM

## 2024-03-08 NOTE — Progress Notes (Signed)
 Patient ID: Jared [name], male   DOB: May 26, 1945, 78 y.o.   MRN: [id number] [facility] Progress Note   Assessment/ Plan:   1.  Acute kidney injury on chronic kidney disease stage IIIa: Labs supportive of ATN (possibly ischemic) in the setting of previous ABLA/hemorrhagic shock.  Unclear whether there is a contributory role from renal vascular congestion based on his volume status and prior history of CHF.  Previously had a trial of albumin with furosemide  to attempt volume unloading to see if he would have improvement of renal function however, was unsuccessful.  He continues to show improvement of renal function with ongoing observation and appears clinically stable to discharge home today with furosemide  20 mg daily.  Hold off on restarting Jardiance at this time (this will be restarted potentially in the next 2 weeks when he follows up with me as an outpatient).  Our office will reach out to him tomorrow with details of HFU. 2.  Anion gap metabolic acidosis: Mild and secondary to acute kidney injury.  On treatment with sodium bicarbonate. 3.  Anemia: He has underlying CLL and myelodysplastic syndrome and suffered acute on chronic with recent evidence of acute blood loss (perigastric versus gastric wall). 4.  Hypokalemia: Exacerbated by diuretic induced losses, status post oral replacement. 5.  Possible community-acquired pneumonia: Completed antibiotic therapy with IV Zosyn and blood cultures negative to date.  Subjective:   Feeling well and without any focal complaints at this time   Objective:   BP 117/68 (BP Location: Right Arm)   Pulse 86   Temp 98.8 F (37.1 C) (Oral)   Resp 20   Ht 5' 11 (1.803 m)   Wt 77.2 kg   SpO2 100%   BMI 23.74 kg/m   Intake/Output Summary (Last 24 hours) at 03/08/2024 1051 Last data filed at 03/08/2024 0843 Gross per 24 hour  Intake 240 ml  Output 800 ml  Net -560 ml   Weight change:   Physical Exam: Gen: Resting comfortably in  recliner, wife and children at bedside CVS: Pulse regular rhythm, normal rate, S1 and S2 normal Resp: Clear to auscultation, no rales/rhonchi Abd: Soft, obese, mild tenderness over epigastric area Ext: 2+ lower extremity edema  Imaging: No results found.   Labs: BMET Recent Labs  Lab 03/02/24 [phone] 03/03/24 0417 03/04/24 0431 03/05/24 0420 03/06/24 0406 03/07/24 0436 03/08/24 0444  NA 135 135 136 135 136 136 136  K 3.6 3.6 3.3* 3.7 3.6 3.6 3.9  CL 103 103 103 104 104 103 103  CO2 20* 20* 20* 18* 20* 19* 19*  GLUCOSE 77 89 82 92 82 78 67*  BUN 31* 37* 37* 41* 41* 39* 36*  CREATININE 1.95* 2.43* 2.75* 3.03* 3.20* 2.82* 2.62*  CALCIUM 8.2* 8.2* 8.2* 8.2* 8.3* 8.5* 8.4*  PHOS  --   --  2.9 3.0 3.0 3.0 3.2   CBC Recent Labs  Lab 03/05/24 0420 03/06/24 0406 03/07/24 0436 03/08/24 0444  WBC 16.3* 17.1* 19.3* 23.4*  HGB 8.1* 8.1* 8.7* 9.1*  HCT 27.1* 27.3* 29.5* 29.8*  MCV 95.8 96.1 96.7 95.8  PLT 165 172 187 189   Medications:     atorvastatin  10 mg Oral Daily   brimonidine   1 drop Left Eye BID   dorzolamide -timolol   1 drop Left Eye BID   feeding supplement  237 mL Oral BID BM   Influenza vac split trivalent PF  0.5 mL Intramuscular Tomorrow-1000   latanoprost   1 drop Left Eye QHS  multivitamin with minerals  1 tablet Oral Daily   pantoprazole  (PROTONIX ) IV  40 mg Intravenous Q12H   sodium bicarbonate  650 mg Oral BID   tamsulosin   0.4 mg Oral QHS   Gordy Blanch, MD 03/08/2024, 10:51 AM

## 2024-03-08 NOTE — TOC Transition Note (Signed)
 Transition of Care [facility]) - Discharge Note   Patient Details  Name: Jared [name] MRN: [id number] Date of Birth: 1946/03/22  Transition of Care [facility]) CM/SW Contact:  Jon ONEIDA Anon, RN Phone Number: 03/08/2024, 10:24 AM   Clinical Narrative:    Pt to discharge home with home health services. Pt will receive HH PT/OT services through [facility]. Pt is in agreement with the DC plan. HH orders are in place. Pt family to provide transportation at time of discharge. There are no further ICM needs at this time.      Final next level of care: Home w Home Health Services Barriers to Discharge: Barriers Resolved   Patient Goals and CMS Choice Patient states their goals for this hospitalization and ongoing recovery are:: To return home with home health services CMS Medicare.gov Compare Post Acute Care list provided to:: Patient Choice offered to / list presented to : Patient [place] ownership interest in [facility].provided to:: Patient    Discharge Placement                       Discharge Plan and Services Additional resources added to the After Visit Summary for                  DME Arranged: N/A DME Agency: NA Date DME Agency Contacted: 03/02/24     HH Arranged: OT, PT HH Agency: [facility] Date HH Agency Contacted: 03/02/24 Time HH Agency Contacted: 1600    Social Drivers of Health (SDOH) Interventions SDOH Screenings   Food Insecurity: No Food Insecurity (02/24/2024)  Housing: Low Risk  (02/24/2024)  Transportation Needs: No Transportation Needs (02/24/2024)  Utilities: Not At Risk (02/24/2024)  Social Connections: Socially Integrated (02/24/2024)  Tobacco Use: Medium Risk (02/27/2024)     Readmission Risk Interventions    02/26/2024    9:46 AM  Readmission Risk Prevention Plan  Post Dischage Appt Complete  Medication Screening Complete  Transportation Screening Complete

## 2024-03-11 ENCOUNTER — Encounter: Payer: Self-pay | Admitting: Adult Health

## 2024-03-11 ENCOUNTER — Inpatient Hospital Stay: Admitting: Adult Health

## 2024-03-11 ENCOUNTER — Encounter: Payer: Self-pay | Admitting: Hematology and Oncology

## 2024-03-11 ENCOUNTER — Inpatient Hospital Stay

## 2024-03-11 VITALS — BP 96/50 | HR 91 | Temp 98.7°F | Resp 17 | Ht 71.0 in | Wt 174.5 lb

## 2024-03-11 DIAGNOSIS — D469 Myelodysplastic syndrome, unspecified: Secondary | ICD-10-CM

## 2024-03-11 DIAGNOSIS — D461 Refractory anemia with ring sideroblasts: Secondary | ICD-10-CM | POA: Diagnosis present

## 2024-03-11 DIAGNOSIS — L8995 Pressure ulcer of unspecified site, unstageable: Secondary | ICD-10-CM | POA: Diagnosis not present

## 2024-03-11 DIAGNOSIS — Z79899 Other long term (current) drug therapy: Secondary | ICD-10-CM | POA: Diagnosis not present

## 2024-03-11 DIAGNOSIS — N189 Chronic kidney disease, unspecified: Secondary | ICD-10-CM | POA: Diagnosis not present

## 2024-03-11 DIAGNOSIS — Z809 Family history of malignant neoplasm, unspecified: Secondary | ICD-10-CM | POA: Diagnosis not present

## 2024-03-11 DIAGNOSIS — C9110 Chronic lymphocytic leukemia of B-cell type not having achieved remission: Secondary | ICD-10-CM | POA: Diagnosis present

## 2024-03-11 DIAGNOSIS — Z87891 Personal history of nicotine dependence: Secondary | ICD-10-CM | POA: Diagnosis not present

## 2024-03-11 DIAGNOSIS — D696 Thrombocytopenia, unspecified: Secondary | ICD-10-CM | POA: Diagnosis not present

## 2024-03-11 MED ORDER — EPOETIN ALFA-EPBX 40000 UNIT/ML IJ SOLN: 40000.0000 [IU] | Freq: Once | INTRAMUSCULAR | Status: DC

## 2024-03-11 MED ORDER — EPOETIN ALFA-EPBX 40000 UNIT/ML IJ SOLN
40000.0000 [IU] | Freq: Once | INTRAMUSCULAR | Status: AC
  Administered 2024-03-11: 40000 [IU] via SUBCUTANEOUS
  Filled 2024-03-11: qty 1

## 2024-03-11 NOTE — Progress Notes (Signed)
 [facility] Cancer Follow up:    Jared Prentice SAUNDERS, FNP [phone] [place] [address]   DIAGNOSIS: MDS   SUMMARY OF ONCOLOGIC HISTORY: MDS, IPSS-R score of 6 points, Median OS of 1.6 yrs, with 1.4 yrs to 25% AML evolution. Bone marrow transplant not feasible due to age and medical fitness. - Recommend supportive care and we will consider hypomethylating agents down the lane if he has worsening multiple cytopenias. - Labs today, consider PRBC transfusion to maintain a Hb of 8 g/dl - Order weekly EPO or darbepoietin to improve hemoglobin. - EPO levels to be drawn today, will conder ESA if Epo levels less than 200 mu/ml - Dose of EPO alpha is 40000-60000 units/week [place],  - He understands that this is not a curative treatment and we have discussed the prognosis.   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  CURRENT THERAPY:Retacrit  INTERVAL HISTORY:  Discussed the use of AI scribe software for clinical note transcription with the patient, who gave verbal consent to proceed.  History of Present Illness Jared [name] is an 78 year old male with a history of upper GI bleed and chronic kidney disease who presents for follow-up after recent hospitalization.  He was recently hospitalized for generalized abdominal pain, weakness, and lethargy. During hospitalization, hemoglobin was 5.6 with positive fecal occult blood, diagnosed as an upper GI bleed due to severe hemorrhagic gastritis. He received packed red blood cells.  Since discharge, he reports some improvement in strength, walking more efficiently with a walker, improved grip strength, and better balance. He awaits physical therapy services and performs exercises with resistance bands. He experiences low energy levels and requires assistance with fluid intake, primarily drinking water. He can perform some activities of daily living but needs help with  others.  He experiences abdominal discomfort, particularly in the morning, localized to the upper abdomen. No recent blood in stool or black tarry stools, with stools normal in color recently.  He has swelling in his lower extremities, including legs, ankles, and calves. Abdominal distension has decreased since fluid removal during hospitalization.  He received Protonix  intravenously during his hospital stay. Chronic kidney disease affects his erythropoietin production, contributing to anemia, and he receives injections to manage this condition.     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  MEDICAL HISTORY: Past Medical History:  Diagnosis Date   Arthritis    knees and back   Enlarged prostate    takes Flomax    Headache    Hypertension    stress test done about 12 yrs ago - results wnl per pt.    SURGICAL HISTORY: Past Surgical History:  Procedure Laterality Date   BACK SURGERY  2002   CORONARY PRESSURE/FFR STUDY  N/A 04/23/2023   Procedure: CORONARY PRESSURE/FFR STUDY;  Surgeon: Elmira Newman PARAS, MD;  Location: [facility];  Service: Cardiovascular;  Laterality: N/A;   ESOPHAGOGASTRODUODENOSCOPY N/A 02/27/2024   Procedure: EGD  (ESOPHAGOGASTRODUODENOSCOPY);  Surgeon: Elicia Claw, MD;  Location: [facility];  Service: Gastroenterology;  Laterality: N/A;   JOINT REPLACEMENT     KNEE ARTHROSCOPY Left 05/2004   thelbert 09/27/2010   RIGHT/LEFT HEART CATH AND CORONARY ANGIOGRAPHY N/A 04/23/2023   Procedure: RIGHT/LEFT HEART CATH AND CORONARY ANGIOGRAPHY;  Surgeon: Elmira Newman PARAS, MD;  Location: [facility];  Service: Cardiovascular;  Laterality: N/A;   SHOULDER ARTHROSCOPY WITH OPEN ROTATOR CUFF REPAIR Right 2009   /notes 09/13/2010   TOTAL HIP ARTHROPLASTY Left 10/02/2017   TOTAL HIP ARTHROPLASTY Left 10/02/2017   Procedure: LEFT TOTAL HIP ARTHROPLASTY ANTERIOR APPROACH;  Surgeon: Liam Lerner, MD;  Location: [facility];  Service: Orthopedics;  Laterality: Left;   TOTAL KNEE ARTHROPLASTY  04/16/2011   Procedure: TOTAL KNEE ARTHROPLASTY;  Surgeon: Lerner PARAS Liam;  Location: [facility];  Service: Orthopedics;  Laterality: Left;  LEFT TOTAL KNEE ARTHROPLASTY    TOTAL KNEE ARTHROPLASTY Right 04/19/2014   Procedure: RIGHT TOTAL KNEE ARTHROPLASTY;  Surgeon: Lerner PARAS Liam, MD;  Location: [facility];  Service: Orthopedics;  Laterality: Right;    SOCIAL HISTORY: Social History   Socioeconomic History   Marital status: Married    Spouse name: Not on file   Number of children: Not on file   Years of education: Not on file   Highest education level: Not on file  Occupational History   Not on file  Tobacco Use   Smoking status: Former    Current packs/day: 0.00    Average packs/day: 1 pack/day for 10.0 years (10.0 ttl pk-yrs)    Types: Cigarettes    Start date: 09/06/1964    Quit date: 09/07/1974    Years since quitting: 49.5   Smokeless tobacco: Never  Vaping Use   Vaping status: Never Used  Substance and Sexual Activity   Alcohol use: No    Comment: quit 1976 - beer   Drug use: No   Sexual activity: Not on file  Other Topics Concern   Not on file  Social History Narrative   Not on file   Social Drivers of  Health   Financial Resource Strain: Not on file  Food Insecurity: No Food Insecurity (02/24/2024)   Hunger Vital Sign    Worried About Running Out of Food in the Last Year: Never true    Ran Out of Food in the Last Year: Never true  Transportation Needs: No Transportation Needs (02/24/2024)   PRAPARE - Administrator, Civil Service (Medical): No    Lack of Transportation (Non-Medical): No  Physical Activity: Not on file  Stress: Not on file  Social Connections: Socially Integrated (02/24/2024)   Social Connection and Isolation Panel    Frequency of Communication with Friends and Family: Three times a week    Frequency of Social Gatherings with Friends and Family: Three times a week    Attends Religious Services: More than 4 times per year    Active Member of Clubs or Organizations: Yes    Attends Banker Meetings: More than 4 times per year    Marital Status: Married  Catering Manager Violence: Not At Risk (02/24/2024)   Humiliation, Afraid, Rape, and Kick questionnaire    Fear of Current or Ex-Partner: No    Emotionally Abused: No  Physically Abused: No    Sexually Abused: No    FAMILY HISTORY: Family History  Problem Relation Age of Onset   Migraines Mother    Cancer Sister    Cancer Sister     Review of Systems  Constitutional:  Negative for appetite change, chills, fatigue, fever and unexpected weight change.  HENT:   Negative for hearing loss, lump/mass and trouble swallowing.   Eyes:  Negative for eye problems and icterus.  Respiratory:  Negative for chest tightness, cough and shortness of breath.   Cardiovascular:  Negative for chest pain, leg swelling and palpitations.  Gastrointestinal:  Negative for abdominal distention, abdominal pain, constipation, diarrhea, nausea and vomiting.  Endocrine: Negative for hot flashes.  Genitourinary:  Negative for difficulty urinating.   Musculoskeletal:  Negative for arthralgias.  Skin:  Negative for  itching and rash.  Neurological:  Negative for dizziness, extremity weakness, headaches and numbness.  Hematological:  Negative for adenopathy. Does not bruise/bleed easily.  Psychiatric/Behavioral:  Negative for depression. The patient is not nervous/anxious.       PHYSICAL EXAMINATION   Onc Performance Status - 03/11/24 1049       ECOG Perf Status   ECOG Perf Status Capable of only limited selfcare, confined to bed or chair more than 50% of waking hours      KPS SCALE   KPS % SCORE Cares for self, unable to carry on normal activity or to do active work          Vitals:   03/11/24 1046  BP: (!) 96/50  Pulse: 91  Resp: 17  Temp: 98.7 F (37.1 C)  SpO2: 95%    Physical Exam Constitutional:      General: He is not in acute distress.    Appearance: Normal appearance. He is ill-appearing (appears chronically ill, examined in wheelchair). He is not toxic-appearing.  HENT:     Head: Normocephalic and atraumatic.     Mouth/Throat:     Mouth: Mucous membranes are moist.     Pharynx: Oropharynx is clear. No oropharyngeal exudate or posterior oropharyngeal erythema.  Eyes:     General: No scleral icterus. Cardiovascular:     Rate and Rhythm: Normal rate and regular rhythm.     Pulses: Normal pulses.     Heart sounds: Normal heart sounds.  Pulmonary:     Effort: Pulmonary effort is normal.     Breath sounds: Normal breath sounds.  Abdominal:     General: Abdomen is flat. Bowel sounds are normal. There is no distension.     Palpations: Abdomen is soft.     Tenderness: There is no abdominal tenderness.  Musculoskeletal:        General: No swelling.     Cervical back: Neck supple.  Lymphadenopathy:     Cervical: No cervical adenopathy.  Skin:    General: Skin is warm and dry.     Findings: No rash.     Comments: Please see pictures below of sacral wound.    Neurological:     General: No focal deficit present.     Mental Status: He is alert.  Psychiatric:         Mood and Affect: Mood normal.        Behavior: Behavior normal.   Sacral wound on day of appointment in our office   LABORATORY DATA:  CBC    Component Value Date/Time   WBC 23.4 (H) 03/08/2024 0444   RBC 3.11 (L) 03/08/2024 0444  HGB 9.1 (L) 03/08/2024 0444   HGB 9.5 (L) 05/17/2023 1216   HCT 29.8 (L) 03/08/2024 0444   HCT 30.5 (L) 12/31/2023 1602   PLT 189 03/08/2024 0444   PLT 132 (L) 05/17/2023 1216   MCV 95.8 03/08/2024 0444   MCV 102 (H) 05/17/2023 1216   MCH 29.3 03/08/2024 0444   MCHC 30.5 03/08/2024 0444   RDW 25.8 (H) 03/08/2024 0444   RDW 21.6 (H) 05/17/2023 1216   LYMPHSABS 2.6 02/24/2024 1446   LYMPHSABS 2.3 05/17/2023 1216   MONOABS 0.9 02/24/2024 1446   EOSABS 0.0 02/24/2024 1446   EOSABS 0.1 05/17/2023 1216   BASOSABS 0.3 (H) 02/24/2024 1446   BASOSABS 0.0 05/17/2023 1216    CMP     Component Value Date/Time   NA 136 03/08/2024 0444   NA 143 05/17/2023 1216   K 3.9 03/08/2024 0444   CL 103 03/08/2024 0444   CO2 19 (L) 03/08/2024 0444   GLUCOSE 67 (L) 03/08/2024 0444   BUN 36 (H) 03/08/2024 0444   BUN 22 05/17/2023 1216   CREATININE 2.62 (H) 03/08/2024 0444   CREATININE 1.74 (H) 12/31/2023 1601   CALCIUM 8.4 (L) 03/08/2024 0444   PROT 5.5 (L) 02/26/2024 0309   ALBUMIN 2.9 (L) 03/08/2024 0444   AST 46 (H) 02/26/2024 0309   AST 19 12/31/2023 1601   ALT 25 02/26/2024 0309   ALT 10 12/31/2023 1601   ALKPHOS 227 (H) 02/26/2024 0309   BILITOT 1.3 (H) 02/26/2024 0309   BILITOT 1.4 (H) 12/31/2023 1601   GFRNONAA 24 (L) 03/08/2024 0444   GFRNONAA 39 (L) 12/31/2023 1601   GFRAA 59 (L) 10/03/2017 0352     ASSESSMENT and THERAPY PLAN:   Assessment and Plan Assessment & Plan 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 Chronic kidney disease decreases erythropoietin production, contributing to anemia. Goal: manage anemia to avoid transfusions, except in  acute situations. - Administer erythropoietin injection. - Monitor blood counts regularly. - RTC in 2 weeks for labs, f/u and injection - Follow with PCP labs, visit scheduled for tomorrow  Recent upper gastrointestinal bleeding due to severe hemorrhagic gastritis Recent hospitalization for upper GI bleeding due to severe hemorrhagic gastritis. No signs of active bleeding. GI specialist managing stomach medications and bleeding risk. - Coordinate with GI specialist for management of stomach medications, including Protonix . - Monitor for signs of recurrent bleeding, such as black tarry stools.  Edema of lower extremities Edema likely due to fluid retention from recent hospitalization and medication. Amlodipine , which can cause swelling, is on hold. - Use compression stockings. - Elevate legs for at least 30 minutes after walking.  Impaired physical mobility Impaired mobility due to recent hospitalization and weakness. Physical therapy planned to improve strength and mobility. - Coordinate with physical therapy for home exercises. - Encourage use of walker for safe mobility. - Promote chair exercises and use of resistance bands.  Sacral wound Presence of a wound on the back requiring evaluation and management. - Order home health wound care.   All questions were answered. The patient knows to call the clinic with any problems, questions or concerns. We can certainly see the patient much sooner if necessary.  Total encounter time:50 minutes*in face-to-face visit time, chart review, lab review, care coordination, order entry, and documentation of the encounter time.    Morna Kendall, NP 03/11/24 11:05 AM Medical Oncology and Hematology [facility] [address] [place]  [place], [place] [address] Tel. [phone]    Fax. [phone]  *Total Encounter Time as defined by the Centers for Medicare and Medicaid Services includes, in addition to the face-to-face time of a  patient visit (documented in the note above) non-face-to-face time: obtaining and reviewing outside history, ordering and reviewing medications, tests or procedures, care coordination (communications with other health care professionals or caregivers) and documentation in the medical record.

## 2024-03-11 NOTE — Patient Instructions (Addendum)
 Return in 2 weeks and in 4 weeks for labs, f/u, and injection  I will order home health wound care to evaluate the wound on the sacrum.   Follow up with [facility] tomorrow, and request they fax us  lab tests if they get any. Follow up with Dr. Rosalie as scheduled on November 10 and Dr. Tobie on November 11 and November 18.    After walking, elevate the legs for at least thirty minutes.  Use compression stockings to help with the lower leg swelling.   Epoetin Alfa Injection What is this medication? EPOETIN ALFA (e POE e tin AL fa) treats low levels of red blood cells (anemia) caused by kidney disease, chemotherapy, or HIV medications. It can also be used in people who are at risk for blood loss during surgery. It works by systems analyst make more red blood cells, which reduces the need for blood transfusions. This medicine may be used for other purposes; ask your health care provider or pharmacist if you have questions. COMMON BRAND NAME(S): Epogen, Procrit, Retacrit What should I tell my care team before I take this medication? They need to know if you have any of these conditions: Blood clots Cancer Heart disease High blood pressure On dialysis Seizures Stroke An unusual or allergic reaction to epoetin alfa, albumin, benzyl alcohol, other medications, foods, dyes, or preservatives Pregnant or trying to get pregnant Breast-feeding How should I use this medication? This medication is injected into a vein or under the skin. It is usually given by your care team in a hospital or clinic setting. It may also be given at home. If you get this medication at home, you will be taught how to prepare and give it. Use exactly as directed. Take it as directed on the prescription label at the same time every day. Keep taking it unless your care team tells you to stop. It is important that you put your used needles and syringes in a special sharps container. Do not put them in a trash can. If you do  not have a sharps container, call your pharmacist or care team to get one. A special MedGuide will be given to you by the pharmacist with each prescription and refill. Be sure to read this information carefully each time. Talk to your care team about the use of this medication in children. While this medication may be used in children as young as 1 month of age for selected conditions, precautions do apply. Overdosage: If you think you have taken too much of this medicine contact a poison control center or emergency room at once. NOTE: This medicine is only for you. Do not share this medicine with others. What if I miss a dose? If you miss a dose, take it as soon as you can. If it is almost time for your next dose, take only that dose. Do not take double or extra doses. What may interact with this medication? Darbepoetin alfa Methoxy polyethylene glycol-epoetin beta This list may not describe all possible interactions. Give your health care provider a list of all the medicines, herbs, non-prescription drugs, or dietary supplements you use. Also tell them if you smoke, drink alcohol, or use illegal drugs. Some items may interact with your medicine. What should I watch for while using this medication? Visit your care team for regular checks on your progress. Check your blood pressure as directed. Know what your blood pressure should be and when to contact your care team. Your condition will  be monitored carefully while you are receiving this medication. You may need blood work while taking this medication. What side effects may I notice from receiving this medication? Side effects that you should report to your care team as soon as possible: Allergic reactions--skin rash, itching, hives, swelling of the face, lips, tongue, or throat Blood clot--pain, swelling, or warmth in the leg, shortness of breath, chest pain Heart attack--pain or tightness in the chest, shoulders, arms, or jaw, nausea, shortness  of breath, cold or clammy skin, feeling faint or lightheaded Increase in blood pressure Rash, fever, and swollen lymph nodes Redness, blistering, peeling, or loosening of the skin, including inside the mouth Seizures Stroke--sudden numbness or weakness of the face, arm, or leg, trouble speaking, confusion, trouble walking, loss of balance or coordination, dizziness, severe headache, change in vision Side effects that usually do not require medical attention (report to your care team if they continue or are bothersome): Bone, joint, or muscle pain Cough Headache Nausea Pain, redness, or irritation at injection site This list may not describe all possible side effects. Call your doctor for medical advice about side effects. You may report side effects to FDA at 1-800-FDA-1088. Where should I keep my medication? Keep out of the reach of children and pets. Store in a refrigerator. Do not freeze. Do not shake. Protect from light. Keep this medication in the original container until you are ready to take it. See product for storage information. Get rid of any unused medication after the expiration date. To get rid of medications that are no longer needed or have expired: Take the medication to a medication take-back program. Check with your pharmacy or law enforcement to find a location. If you cannot return the medication, ask your pharmacist or care team how to get rid of the medication safely. NOTE: This sheet is a summary. It may not cover all possible information. If you have questions about this medicine, talk to your doctor, pharmacist, or health care provider.  2024 Elsevier/Gold Standard (2021-09-01 00:00:00)

## 2024-03-13 ENCOUNTER — Inpatient Hospital Stay

## 2024-03-13 ENCOUNTER — Ambulatory Visit

## 2024-03-13 ENCOUNTER — Encounter: Payer: Self-pay | Admitting: Hematology and Oncology

## 2024-03-17 ENCOUNTER — Other Ambulatory Visit: Payer: Self-pay

## 2024-03-17 ENCOUNTER — Telehealth: Payer: Self-pay

## 2024-03-17 DIAGNOSIS — D539 Nutritional anemia, unspecified: Secondary | ICD-10-CM

## 2024-03-17 NOTE — Telephone Encounter (Signed)
 Patient appointment scheduled for labs and injection per request of DNP [facility].

## 2024-03-18 ENCOUNTER — Encounter: Payer: Self-pay | Admitting: Family Medicine

## 2024-03-19 ENCOUNTER — Telehealth: Payer: Self-pay

## 2024-03-19 ENCOUNTER — Other Ambulatory Visit: Payer: Self-pay

## 2024-03-19 ENCOUNTER — Inpatient Hospital Stay

## 2024-03-19 ENCOUNTER — Inpatient Hospital Stay: Attending: Hematology and Oncology

## 2024-03-19 VITALS — BP 106/60 | HR 80 | Temp 98.5°F | Resp 16

## 2024-03-19 DIAGNOSIS — C9110 Chronic lymphocytic leukemia of B-cell type not having achieved remission: Secondary | ICD-10-CM | POA: Insufficient documentation

## 2024-03-19 DIAGNOSIS — R531 Weakness: Secondary | ICD-10-CM | POA: Diagnosis not present

## 2024-03-19 DIAGNOSIS — D469 Myelodysplastic syndrome, unspecified: Secondary | ICD-10-CM

## 2024-03-19 DIAGNOSIS — Z87891 Personal history of nicotine dependence: Secondary | ICD-10-CM | POA: Insufficient documentation

## 2024-03-19 DIAGNOSIS — D696 Thrombocytopenia, unspecified: Secondary | ICD-10-CM | POA: Insufficient documentation

## 2024-03-19 DIAGNOSIS — D539 Nutritional anemia, unspecified: Secondary | ICD-10-CM

## 2024-03-19 DIAGNOSIS — R197 Diarrhea, unspecified: Secondary | ICD-10-CM | POA: Insufficient documentation

## 2024-03-19 DIAGNOSIS — R630 Anorexia: Secondary | ICD-10-CM | POA: Insufficient documentation

## 2024-03-19 DIAGNOSIS — D461 Refractory anemia with ring sideroblasts: Secondary | ICD-10-CM | POA: Diagnosis present

## 2024-03-19 DIAGNOSIS — R109 Unspecified abdominal pain: Secondary | ICD-10-CM | POA: Insufficient documentation

## 2024-03-19 LAB — CBC WITH DIFFERENTIAL (CANCER CENTER ONLY)
Abs Immature Granulocytes: 3.8 K/uL — ABNORMAL HIGH (ref 0.00–0.07)
Band Neutrophils: 2 %
Basophils Absolute: 0 K/uL (ref 0.0–0.1)
Basophils Relative: 0 %
Blasts: 4 %
Eosinophils Absolute: 0 K/uL (ref 0.0–0.5)
Eosinophils Relative: 0 %
HCT: 28.5 % — ABNORMAL LOW (ref 39.0–52.0)
Hemoglobin: 9.1 g/dL — ABNORMAL LOW (ref 13.0–17.0)
Lymphocytes Relative: 2 %
Lymphs Abs: 1.1 K/uL (ref 0.7–4.0)
MCH: 29.9 pg (ref 26.0–34.0)
MCHC: 31.9 g/dL (ref 30.0–36.0)
MCV: 93.8 fL (ref 80.0–100.0)
Metamyelocytes Relative: 4 %
Monocytes Absolute: 0 K/uL — ABNORMAL LOW (ref 0.1–1.0)
Monocytes Relative: 0 %
Myelocytes: 2 %
Neutro Abs: 46.7 K/uL — ABNORMAL HIGH (ref 1.7–7.7)
Neutrophils Relative %: 85 %
Platelet Count: 129 K/uL — ABNORMAL LOW (ref 150–400)
Promyelocytes Relative: 1 %
RBC: 3.04 MIL/uL — ABNORMAL LOW (ref 4.22–5.81)
RDW: 28.8 % — ABNORMAL HIGH (ref 11.5–15.5)
WBC Count: 53.7 K/uL (ref 4.0–10.5)
nRBC: 6 % — ABNORMAL HIGH (ref 0.0–0.2)

## 2024-03-19 LAB — BASIC METABOLIC PANEL - CANCER CENTER ONLY
Anion gap: 10 (ref 5–15)
BUN: 49 mg/dL — ABNORMAL HIGH (ref 8–23)
CO2: 23 mmol/L (ref 22–32)
Calcium: 8.2 mg/dL — ABNORMAL LOW (ref 8.9–10.3)
Chloride: 104 mmol/L (ref 98–111)
Creatinine: 2.62 mg/dL — ABNORMAL HIGH (ref 0.61–1.24)
GFR, Estimated: 24 mL/min — ABNORMAL LOW (ref >60)
Glucose, Bld: 105 mg/dL — ABNORMAL HIGH (ref 70–99)
Potassium: 4.1 mmol/L (ref 3.5–5.1)
Sodium: 137 mmol/L (ref 135–145)

## 2024-03-19 LAB — SAMPLE TO BLOOD BANK

## 2024-03-19 LAB — TSH: TSH: 19.5 u[IU]/mL — ABNORMAL HIGH (ref 0.350–4.500)

## 2024-03-19 MED ORDER — EPOETIN ALFA-EPBX 40000 UNIT/ML IJ SOLN
40000.0000 [IU] | Freq: Once | INTRAMUSCULAR | Status: AC
  Administered 2024-03-19: 40000 [IU] via SUBCUTANEOUS
  Filled 2024-03-19: qty 1

## 2024-03-19 NOTE — Telephone Encounter (Signed)
 CRITICAL VALUE STICKER  CRITICAL VALUE: White Count 53.7  RECEIVER (on-site recipient of call): Norleen Spain CMA  DATE & TIME NOTIFIED: 03/19/2024 1020  MESSENGER (representative from lab): Harlene in lab  MD NOTIFIED: Morna Kendall NP  TIME OF NOTIFICATION: 1020  RESPONSE:  Made Nurse aware

## 2024-03-20 ENCOUNTER — Ambulatory Visit: Payer: Self-pay | Admitting: Hematology and Oncology

## 2024-03-20 LAB — SURGICAL PATHOLOGY

## 2024-03-23 ENCOUNTER — Telehealth: Payer: Self-pay

## 2024-03-23 LAB — FLOW CYTOMETRY

## 2024-03-23 NOTE — Telephone Encounter (Signed)
 Returned call to patient's wife, Fronie, in response to message left by office on 11/7. Results reiterated and upcoming appointments confirmed with wife.

## 2024-03-24 NOTE — Progress Notes (Addendum)
 Jared [name]:    Jared Prentice SAUNDERS, FNP [phone] [place] [address]   DIAGNOSIS:  Cancer Staging  No matching staging information was found for the patient.    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 Bone marrow transplant not feasible due to age and medical fitness. - Recommend supportive care and we will consider hypomethylating agents down the lane if he has worsening multiple cytopenias. - Labs today, consider PRBC transfusion to maintain a Hb of 8 g/dl - Order weekly EPO or darbepoietin to improve hemoglobin. - EPO levels to be drawn today, will conder ESA if Epo levels less than 200 mu/ml - Dose of EPO alpha is 40000-60000 units/week ,  - He understands that this is not a curative treatment and we have discussed the prognosis. 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  CURRENT THERAPY: retacrit   INTERVAL HISTORY:  Discussed the use of AI scribe software for clinical note transcription with the patient, who gave verbal consent to proceed.  History of Present Illness Jared [name] is an 78 year old male who presents for f/u of his anemia and MDS with elevated white blood cell count.  He has had an elevated white blood cell count for the past week with an increasing neutrophil count and no increase in blasts on his blood smear. He experiences weakness and decreased appetite, often eating very little. He also has diarrhea and abdominal swelling and tightness extending to his backside. He was recently discharged from the hospital and is receiving home health care, including wound care small sacral breakdown on his backside that his family notes is improving.   He denies  fevers/chills, but is feeling weaker since we saw him last.      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  MEDICAL HISTORY: Past Medical History:  Diagnosis Date   Arthritis    knees and back   Enlarged prostate    takes Flomax    Headache    Hypertension    stress test done about 12 yrs ago - results wnl per pt.    SURGICAL HISTORY: Past Surgical History:  Procedure Laterality Date   BACK SURGERY  2002   CORONARY PRESSURE/FFR STUDY N/A 04/23/2023   Procedure: CORONARY PRESSURE/FFR STUDY;  Surgeon: Jared Newman PARAS, MD;  Location: [facility];  Service: Cardiovascular;  Laterality: N/A;   ESOPHAGOGASTRODUODENOSCOPY N/A 02/27/2024   Procedure: EGD (ESOPHAGOGASTRODUODENOSCOPY);  Surgeon: Jared Claw, MD;  Location: [name];  Service: Gastroenterology;  Laterality: N/A;  JOINT REPLACEMENT     KNEE ARTHROSCOPY Left 05/2004   thelbert 09/27/2010   RIGHT/LEFT HEART CATH AND CORONARY ANGIOGRAPHY N/A 04/23/2023   Procedure: RIGHT/LEFT HEART CATH AND CORONARY ANGIOGRAPHY;  Surgeon: Jared Newman PARAS, MD;  Location: [facility];  Service: Cardiovascular;  Laterality: N/A;   SHOULDER ARTHROSCOPY WITH OPEN ROTATOR CUFF  REPAIR Right 2009   /notes 09/13/2010   TOTAL HIP ARTHROPLASTY Left 10/02/2017   TOTAL HIP ARTHROPLASTY Left 10/02/2017   Procedure: LEFT TOTAL HIP ARTHROPLASTY ANTERIOR APPROACH;  Surgeon: Jared Lerner, MD;  Location: [facility];  Service: Orthopedics;  Laterality: Left;   TOTAL KNEE ARTHROPLASTY  04/16/2011   Procedure: TOTAL KNEE ARTHROPLASTY;  Surgeon: Jared [name];  Location: [facility];  Service: Orthopedics;  Laterality: Left;  LEFT TOTAL KNEE ARTHROPLASTY    TOTAL KNEE ARTHROPLASTY Right 04/19/2014   Procedure: RIGHT TOTAL KNEE ARTHROPLASTY;  Surgeon: [name] [name] Liam, MD;  Location: [facility];  Service: Orthopedics;  Laterality: Right;    SOCIAL HISTORY: Social History   Socioeconomic History   Marital status: Married    Spouse name: Not on file   Number of children: Not on file   Years of education: Not on file   Highest education level: Not on file  Occupational History   Not on file  Tobacco Use   Smoking status: Former    Current packs/day: 0.00    Average packs/day: 1 pack/day for 10.0 years (10.0 ttl pk-yrs)    Types: Cigarettes    Start date: 09/06/1964    Quit date: 09/07/1974    Years since quitting: 49.5   Smokeless tobacco: Never  Vaping Use   Vaping status: Never Used  Substance and Sexual Activity   Alcohol  use: No    Comment: quit 1976 - beer   Drug use: No   Sexual activity: Not on file  Other Topics Concern   Not on file  Social History Narrative   Not on file   Social Drivers of Health   Financial Resource Strain: Not on file  Food Insecurity: No Food Insecurity (02/24/2024)   Hunger Vital Sign    Worried About Running Out of Food in the Last Year: Never true    Ran Out of Food in the Last Year: Never true  Transportation Needs: No Transportation Needs (02/24/2024)   PRAPARE - Administrator, Civil Service (Medical): No    Lack of Transportation (Non-Medical): No  Physical Activity: Not on file  Stress: Not on file  Social Connections: Socially  Integrated (02/24/2024)   Social Connection and Isolation Panel    Frequency of Communication with Friends and Family: Three times a week    Frequency of Social Gatherings with Friends and Family: Three times a week    Attends Religious Services: More than 4 times per year    Active Member of Clubs or Organizations: Yes    Attends Banker Meetings: More than 4 times per year    Marital Status: Married  Catering Manager Violence: Not At Risk (02/24/2024)   Humiliation, Afraid, Rape, and Kick questionnaire    Fear of Current or Ex-Partner: No    Emotionally Abused: No    Physically Abused: No    Sexually Abused: No    FAMILY HISTORY: Family History  Problem Relation Age of Onset   Migraines Mother    Cancer Sister    Cancer Sister     Review of Systems  Constitutional:  Positive for appetite change  and fatigue. Negative for chills, fever and unexpected weight change.  HENT:   Negative for hearing loss, lump/mass and trouble swallowing.   Eyes:  Negative for eye problems and icterus.  Respiratory:  Negative for chest tightness, cough and shortness of breath.   Cardiovascular:  Negative for chest pain, leg swelling and palpitations.  Gastrointestinal:  Positive for abdominal distention, abdominal pain and diarrhea. Negative for constipation, nausea and vomiting.  Endocrine: Negative for hot flashes.  Genitourinary:  Negative for difficulty urinating.   Musculoskeletal:  Negative for arthralgias.  Skin:  Negative for itching and rash.  Neurological:  Negative for dizziness, extremity weakness, headaches and numbness.  Hematological:  Negative for adenopathy. Does not bruise/bleed easily.  Psychiatric/Behavioral:  Negative for depression. The patient is not nervous/anxious.       PHYSICAL EXAMINATION    Vitals:   03/26/24 0849  BP: (!) 110/59  Pulse: 93  Resp: 16  Temp: 97.6 F (36.4 C)  SpO2: 93%    Physical Exam Constitutional:      General: He is not  in acute distress.    Appearance: Normal appearance. He is ill-appearing (appears chornically ill). He is not toxic-appearing.  HENT:     Head: Normocephalic and atraumatic.     Mouth/Throat:     Mouth: Mucous membranes are moist.     Pharynx: Oropharynx is clear. No oropharyngeal exudate or posterior oropharyngeal erythema.  Eyes:     General: No scleral icterus. Cardiovascular:     Rate and Rhythm: Normal rate and regular rhythm.     Pulses: Normal pulses.     Heart sounds: Normal heart sounds.  Pulmonary:     Effort: Pulmonary effort is normal.     Comments: Diminished in bases  Abdominal:     General: Abdomen is flat. Bowel sounds are normal. There is distension.     Tenderness: There is abdominal tenderness (+TTP in lower abdomen, exam limited as it was completed with patient in the wheelchair).  Musculoskeletal:        General: No swelling.  Skin:    General: Skin is warm and dry.     Findings: No rash.  Neurological:     General: No focal deficit present.     Mental Status: He is alert.  Psychiatric:        Mood and Affect: Mood normal.        Behavior: Behavior normal.     LABORATORY DATA:  CBC    Component Value Date/Time   WBC 55.3 (HH) 03/26/2024 0820   RBC 3.17 (L) 03/26/2024 0820   HGB 9.4 (L) 03/26/2024 0820   HGB 9.1 (L) 03/19/2024 1001   HGB 9.5 (L) 05/17/2023 1216   HCT 29.4 (L) 03/26/2024 0820   HCT 30.5 (L) 12/31/2023 1602   PLT 113 (L) 03/26/2024 0820   PLT 129 (L) 03/19/2024 1001   PLT 132 (L) 05/17/2023 1216   MCV 92.7 03/26/2024 0820   MCV 102 (H) 05/17/2023 1216   MCH 29.7 03/26/2024 0820   MCHC 32.0 03/26/2024 0820   RDW 29.9 (H) 03/26/2024 0820   RDW 21.6 (H) 05/17/2023 1216   LYMPHSABS 0.6 (L) 03/26/2024 0820   LYMPHSABS 2.3 05/17/2023 1216   MONOABS 0.0 (L) 03/26/2024 0820   EOSABS 0.6 (H) 03/26/2024 0820   EOSABS 0.1 05/17/2023 1216   BASOSABS 0.0 03/26/2024 0820   BASOSABS 0.0 05/17/2023 1216    CMP     Component Value  Date/Time   NA 137 03/19/2024  1001   NA 143 05/17/2023 1216   K 4.1 03/19/2024 1001   CL 104 03/19/2024 1001   CO2 23 03/19/2024 1001   GLUCOSE 105 (H) 03/19/2024 1001   BUN 49 (H) 03/19/2024 1001   BUN 22 05/17/2023 1216   CREATININE 2.62 (H) 03/19/2024 1001   CALCIUM  8.2 (L) 03/19/2024 1001   PROT 5.5 (L) 02/26/2024 0309   ALBUMIN  2.9 (L) 03/08/2024 0444   AST 46 (H) 02/26/2024 0309   AST 19 12/31/2023 1601   ALT 25 02/26/2024 0309   ALT 10 12/31/2023 1601   ALKPHOS 227 (H) 02/26/2024 0309   BILITOT 1.3 (H) 02/26/2024 0309   BILITOT 1.4 (H) 12/31/2023 1601   GFRNONAA 24 (L) 03/19/2024 1001   GFRAA 59 (L) 10/03/2017 0352     ASSESSMENT and THERAPY PLAN:    Assessment and Plan Assessment & Plan Suspected infection Elevated WBC and neutrophils suggest infection. Differential includes abdominal or lung etiology. Emergency evaluation warranted due to age, recent hospitalization, and questionable early sepsis. - Referred to emergency room for evaluation and management. - My nurse Guerry called the [facility] charge nurse to coordinate.  Abdominal pain, swelling, and associated gastrointestinal symptoms (decreased appetite, diarrhea) Symptoms may be related to abdominal infection. - Evaluated abdominal symptoms in the emergency room.  Weakness Potentially related to suspected infection and possible early sepsis. Prompt evaluation needed. - Address underlying suspected abdominal infection and possible early sepsis to improve weakness.  Myelodysplastic syndrome, unspecified No evidence of progression. Hemoglobin stable at 9.4. - Continue to monitor hemoglobin levels and manage as needed. -No increase in blasts on CBC today - Flow cytometry on 11/6 negative for leukemia.   - No Retacrit  today, hemoglobin is 9.4, will defer until next week after patient has undergone evaluation in ER  I reviewed the above with Dr. Loretha in detail, including the patient's labs, as there have been  ongoing discussions about him over the past 2 weeks.  She noted the patient has not had blasts in peripheral blood and flow cytometry was negative on 11/6 indicating no transformation to leukemia.  Recommends patient go to ER for infection work [name] due to abdominal pain.    We will see the patient back hopefully next week, will await ER evaluation and disposition plan.     All questions were answered. The patient knows to call the clinic with any problems, questions or concerns. We can certainly see the patient much sooner if necessary.  Total encounter time:30 minutes*in face-to-face visit time, chart review, lab review, care coordination, order entry, and documentation of the encounter time.    Morna Kendall, NP 03/26/24 9:48 AM Medical Oncology and Hematology [facility] [address] [place], [place] [address] Tel. [phone]    Fax. [phone]  *Total Encounter Time as defined by the Centers for Medicare and Medicaid Services includes, in addition to the face-to-face time of a patient visit (documented in the note above) non-face-to-face time: obtaining and reviewing outside history, ordering and reviewing medications, tests or procedures, care coordination (communications with other health care professionals or caregivers) and documentation in the medical record.   Attending Note  I personally saw the patient, reviewed the chart and examined the patient . The plan of care was discussed with the patient and the admitting team.  It appears that his leukocytosis is improving He did admit to abdominal pain perimumbilical and he appears to have moderate ascites on exam Flow done last week didn't detect AML We will  repeat flow,if no acute leukemia detected, we can consider re marrowing him I would also recommend treating for a possible infection since this could still be leukemoid reaction. I will continue to follow him closely

## 2024-03-26 ENCOUNTER — Other Ambulatory Visit: Payer: Self-pay

## 2024-03-26 ENCOUNTER — Inpatient Hospital Stay

## 2024-03-26 ENCOUNTER — Inpatient Hospital Stay: Admitting: Adult Health

## 2024-03-26 ENCOUNTER — Emergency Department (HOSPITAL_COMMUNITY)

## 2024-03-26 ENCOUNTER — Encounter: Payer: Self-pay | Admitting: Adult Health

## 2024-03-26 ENCOUNTER — Telehealth: Payer: Self-pay | Admitting: *Deleted

## 2024-03-26 ENCOUNTER — Telehealth: Payer: Self-pay

## 2024-03-26 ENCOUNTER — Encounter (HOSPITAL_COMMUNITY): Payer: Self-pay | Admitting: Internal Medicine

## 2024-03-26 ENCOUNTER — Inpatient Hospital Stay (HOSPITAL_COMMUNITY)
Admission: EM | Admit: 2024-03-26 | Discharge: 2024-04-13 | DRG: 871 | Disposition: E | Source: Ambulatory Visit | Attending: Pulmonary Disease | Admitting: Pulmonary Disease

## 2024-03-26 VITALS — BP 110/59 | HR 93 | Temp 97.6°F | Resp 16 | Ht 71.0 in | Wt 166.8 lb

## 2024-03-26 DIAGNOSIS — C9200 Acute myeloblastic leukemia, not having achieved remission: Secondary | ICD-10-CM | POA: Diagnosis present

## 2024-03-26 DIAGNOSIS — L89153 Pressure ulcer of sacral region, stage 3: Secondary | ICD-10-CM | POA: Diagnosis present

## 2024-03-26 DIAGNOSIS — J9811 Atelectasis: Secondary | ICD-10-CM | POA: Diagnosis present

## 2024-03-26 DIAGNOSIS — I130 Hypertensive heart and chronic kidney disease with heart failure and stage 1 through stage 4 chronic kidney disease, or unspecified chronic kidney disease: Secondary | ICD-10-CM | POA: Diagnosis present

## 2024-03-26 DIAGNOSIS — E875 Hyperkalemia: Secondary | ICD-10-CM | POA: Diagnosis present

## 2024-03-26 DIAGNOSIS — I50810 Right heart failure, unspecified: Secondary | ICD-10-CM | POA: Diagnosis not present

## 2024-03-26 DIAGNOSIS — I82442 Acute embolism and thrombosis of left tibial vein: Secondary | ICD-10-CM | POA: Diagnosis present

## 2024-03-26 DIAGNOSIS — Z96642 Presence of left artificial hip joint: Secondary | ICD-10-CM | POA: Diagnosis present

## 2024-03-26 DIAGNOSIS — Z66 Do not resuscitate: Secondary | ICD-10-CM | POA: Diagnosis not present

## 2024-03-26 DIAGNOSIS — E872 Acidosis, unspecified: Secondary | ICD-10-CM | POA: Diagnosis present

## 2024-03-26 DIAGNOSIS — R1084 Generalized abdominal pain: Secondary | ICD-10-CM

## 2024-03-26 DIAGNOSIS — Z79899 Other long term (current) drug therapy: Secondary | ICD-10-CM

## 2024-03-26 DIAGNOSIS — D649 Anemia, unspecified: Secondary | ICD-10-CM | POA: Diagnosis not present

## 2024-03-26 DIAGNOSIS — N179 Acute kidney failure, unspecified: Secondary | ICD-10-CM | POA: Diagnosis not present

## 2024-03-26 DIAGNOSIS — C9110 Chronic lymphocytic leukemia of B-cell type not having achieved remission: Secondary | ICD-10-CM | POA: Diagnosis present

## 2024-03-26 DIAGNOSIS — Z7189 Other specified counseling: Secondary | ICD-10-CM

## 2024-03-26 DIAGNOSIS — D696 Thrombocytopenia, unspecified: Secondary | ICD-10-CM

## 2024-03-26 DIAGNOSIS — D72829 Elevated white blood cell count, unspecified: Secondary | ICD-10-CM | POA: Diagnosis not present

## 2024-03-26 DIAGNOSIS — K652 Spontaneous bacterial peritonitis: Secondary | ICD-10-CM | POA: Diagnosis present

## 2024-03-26 DIAGNOSIS — Z515 Encounter for palliative care: Secondary | ICD-10-CM

## 2024-03-26 DIAGNOSIS — A419 Sepsis, unspecified organism: Principal | ICD-10-CM | POA: Diagnosis present

## 2024-03-26 DIAGNOSIS — K8020 Calculus of gallbladder without cholecystitis without obstruction: Secondary | ICD-10-CM | POA: Diagnosis present

## 2024-03-26 DIAGNOSIS — N170 Acute kidney failure with tubular necrosis: Secondary | ICD-10-CM | POA: Diagnosis present

## 2024-03-26 DIAGNOSIS — R54 Age-related physical debility: Secondary | ICD-10-CM | POA: Diagnosis present

## 2024-03-26 DIAGNOSIS — E8721 Acute metabolic acidosis: Secondary | ICD-10-CM | POA: Diagnosis not present

## 2024-03-26 DIAGNOSIS — Z96653 Presence of artificial knee joint, bilateral: Secondary | ICD-10-CM | POA: Diagnosis present

## 2024-03-26 DIAGNOSIS — L8990 Pressure ulcer of unspecified site, unspecified stage: Secondary | ICD-10-CM | POA: Insufficient documentation

## 2024-03-26 DIAGNOSIS — K2971 Gastritis, unspecified, with bleeding: Secondary | ICD-10-CM | POA: Diagnosis present

## 2024-03-26 DIAGNOSIS — D539 Nutritional anemia, unspecified: Secondary | ICD-10-CM

## 2024-03-26 DIAGNOSIS — I071 Rheumatic tricuspid insufficiency: Secondary | ICD-10-CM | POA: Diagnosis present

## 2024-03-26 DIAGNOSIS — E7220 Disorder of urea cycle metabolism, unspecified: Secondary | ICD-10-CM | POA: Diagnosis not present

## 2024-03-26 DIAGNOSIS — R6521 Severe sepsis with septic shock: Secondary | ICD-10-CM | POA: Diagnosis present

## 2024-03-26 DIAGNOSIS — R188 Other ascites: Secondary | ICD-10-CM | POA: Diagnosis present

## 2024-03-26 DIAGNOSIS — J189 Pneumonia, unspecified organism: Secondary | ICD-10-CM | POA: Diagnosis present

## 2024-03-26 DIAGNOSIS — R570 Cardiogenic shock: Secondary | ICD-10-CM | POA: Diagnosis present

## 2024-03-26 DIAGNOSIS — Z7982 Long term (current) use of aspirin: Secondary | ICD-10-CM

## 2024-03-26 DIAGNOSIS — D469 Myelodysplastic syndrome, unspecified: Secondary | ICD-10-CM

## 2024-03-26 DIAGNOSIS — E790 Hyperuricemia without signs of inflammatory arthritis and tophaceous disease: Secondary | ICD-10-CM | POA: Diagnosis not present

## 2024-03-26 DIAGNOSIS — Z7984 Long term (current) use of oral hypoglycemic drugs: Secondary | ICD-10-CM

## 2024-03-26 DIAGNOSIS — I270 Primary pulmonary hypertension: Secondary | ICD-10-CM | POA: Diagnosis not present

## 2024-03-26 DIAGNOSIS — I5032 Chronic diastolic (congestive) heart failure: Secondary | ICD-10-CM | POA: Diagnosis present

## 2024-03-26 DIAGNOSIS — N400 Enlarged prostate without lower urinary tract symptoms: Secondary | ICD-10-CM | POA: Diagnosis present

## 2024-03-26 DIAGNOSIS — N183 Chronic kidney disease, stage 3 unspecified: Secondary | ICD-10-CM | POA: Diagnosis not present

## 2024-03-26 DIAGNOSIS — G4700 Insomnia, unspecified: Secondary | ICD-10-CM | POA: Diagnosis present

## 2024-03-26 DIAGNOSIS — E860 Dehydration: Secondary | ICD-10-CM | POA: Diagnosis present

## 2024-03-26 DIAGNOSIS — D46Z Other myelodysplastic syndromes: Secondary | ICD-10-CM | POA: Diagnosis not present

## 2024-03-26 DIAGNOSIS — I2722 Pulmonary hypertension due to left heart disease: Secondary | ICD-10-CM | POA: Diagnosis present

## 2024-03-26 DIAGNOSIS — E861 Hypovolemia: Secondary | ICD-10-CM | POA: Diagnosis present

## 2024-03-26 DIAGNOSIS — K659 Peritonitis, unspecified: Secondary | ICD-10-CM | POA: Diagnosis not present

## 2024-03-26 DIAGNOSIS — E883 Tumor lysis syndrome: Secondary | ICD-10-CM | POA: Diagnosis present

## 2024-03-26 DIAGNOSIS — Z87891 Personal history of nicotine dependence: Secondary | ICD-10-CM

## 2024-03-26 DIAGNOSIS — N1832 Chronic kidney disease, stage 3b: Secondary | ICD-10-CM | POA: Diagnosis present

## 2024-03-26 DIAGNOSIS — R579 Shock, unspecified: Secondary | ICD-10-CM | POA: Diagnosis not present

## 2024-03-26 LAB — I-STAT CHEM 8, ED
BUN: 49 mg/dL — ABNORMAL HIGH (ref 8–23)
Calcium, Ion: 1.11 mmol/L — ABNORMAL LOW (ref 1.15–1.40)
Chloride: 103 mmol/L (ref 98–111)
Creatinine, Ser: 3.1 mg/dL — ABNORMAL HIGH (ref 0.61–1.24)
Glucose, Bld: 90 mg/dL (ref 70–99)
HCT: 35 % — ABNORMAL LOW (ref 39.0–52.0)
Hemoglobin: 11.9 g/dL — ABNORMAL LOW (ref 13.0–17.0)
Potassium: 4.1 mmol/L (ref 3.5–5.1)
Sodium: 136 mmol/L (ref 135–145)
TCO2: 21 mmol/L — ABNORMAL LOW (ref 22–32)

## 2024-03-26 LAB — CBC WITH DIFFERENTIAL/PLATELET
Abs Immature Granulocytes: 3.9 K/uL — ABNORMAL HIGH (ref 0.00–0.07)
Abs Immature Granulocytes: 1.1 K/uL — ABNORMAL HIGH (ref 0.00–0.07)
Band Neutrophils: 4 %
Basophils Absolute: 0 K/uL (ref 0.0–0.1)
Basophils Absolute: 0.5 K/uL — ABNORMAL HIGH (ref 0.0–0.1)
Basophils Relative: 0 %
Basophils Relative: 1 %
Blasts: 1 %
Eosinophils Absolute: 0.6 K/uL — ABNORMAL HIGH (ref 0.0–0.5)
Eosinophils Absolute: 0 K/uL (ref 0.0–0.5)
Eosinophils Relative: 1 %
Eosinophils Relative: 0 %
HCT: 29.4 % — ABNORMAL LOW (ref 39.0–52.0)
HCT: 31.4 % — ABNORMAL LOW (ref 39.0–52.0)
Hemoglobin: 9.4 g/dL — ABNORMAL LOW (ref 13.0–17.0)
Hemoglobin: 9.6 g/dL — ABNORMAL LOW (ref 13.0–17.0)
Lymphocytes Relative: 1 %
Lymphocytes Relative: 2 %
Lymphs Abs: 0.6 K/uL — ABNORMAL LOW (ref 0.7–4.0)
Lymphs Abs: 1.1 K/uL (ref 0.7–4.0)
MCH: 29.7 pg (ref 26.0–34.0)
MCH: 29.9 pg (ref 26.0–34.0)
MCHC: 32 g/dL (ref 30.0–36.0)
MCHC: 30.6 g/dL (ref 30.0–36.0)
MCV: 92.7 fL (ref 80.0–100.0)
MCV: 97.8 fL (ref 80.0–100.0)
Metamyelocytes Relative: 3 %
Metamyelocytes Relative: 1 %
Monocytes Absolute: 0 K/uL — ABNORMAL LOW (ref 0.1–1.0)
Monocytes Absolute: 0.5 K/uL (ref 0.1–1.0)
Monocytes Relative: 0 %
Monocytes Relative: 1 %
Myelocytes: 3 %
Myelocytes: 1 %
Neutro Abs: 49.8 K/uL — ABNORMAL HIGH (ref 1.7–7.7)
Neutro Abs: 51.2 K/uL — ABNORMAL HIGH (ref 1.7–7.7)
Neutrophils Relative %: 86 %
Neutrophils Relative %: 94 %
Platelets: 113 K/uL — ABNORMAL LOW (ref 150–400)
Platelets: 115 K/uL — ABNORMAL LOW (ref 150–400)
Promyelocytes Relative: 1 %
RBC: 3.17 MIL/uL — ABNORMAL LOW (ref 4.22–5.81)
RBC: 3.21 MIL/uL — ABNORMAL LOW (ref 4.22–5.81)
RDW: 29.9 % — ABNORMAL HIGH (ref 11.5–15.5)
RDW: 30 % — ABNORMAL HIGH (ref 11.5–15.5)
WBC: 55.3 K/uL (ref 4.0–10.5)
WBC: 54.5 K/uL (ref 4.0–10.5)
nRBC: 7.7 % — ABNORMAL HIGH (ref 0.0–0.2)
nRBC: 7.7 % — ABNORMAL HIGH (ref 0.0–0.2)

## 2024-03-26 LAB — D-DIMER, QUANTITATIVE: D-Dimer, Quant: >20 ug{FEU}/mL — ABNORMAL HIGH (ref 0.00–0.50)

## 2024-03-26 LAB — CMP (CANCER CENTER ONLY)
ALT: 32 U/L (ref 0–44)
AST: 79 U/L — ABNORMAL HIGH (ref 15–41)
Albumin: 3 g/dL — ABNORMAL LOW (ref 3.5–5.0)
Alkaline Phosphatase: 397 U/L — ABNORMAL HIGH (ref 38–126)
Anion gap: 15 (ref 5–15)
BUN: 51 mg/dL — ABNORMAL HIGH (ref 8–23)
CO2: 22 mmol/L (ref 22–32)
Calcium: 8.2 mg/dL — ABNORMAL LOW (ref 8.9–10.3)
Chloride: 102 mmol/L (ref 98–111)
Creatinine: 2.68 mg/dL — ABNORMAL HIGH (ref 0.61–1.24)
GFR, Estimated: 23 mL/min — ABNORMAL LOW (ref >60)
Glucose, Bld: 82 mg/dL (ref 70–99)
Potassium: 3.9 mmol/L (ref 3.5–5.1)
Sodium: 139 mmol/L (ref 135–145)
Total Bilirubin: 5.5 mg/dL (ref 0.0–1.2)
Total Protein: 5.2 g/dL — ABNORMAL LOW (ref 6.5–8.1)

## 2024-03-26 LAB — RETICULOCYTES
Immature Retic Fract: 26.2 % — ABNORMAL HIGH (ref 2.3–15.9)
RBC.: 2.8 MIL/uL — ABNORMAL LOW (ref 4.22–5.81)
Retic Count, Absolute: 190 K/uL — ABNORMAL HIGH (ref 19.0–186.0)
Retic Ct Pct: 6.8 % — ABNORMAL HIGH (ref 0.4–3.1)

## 2024-03-26 LAB — FERRITIN: Ferritin: 2842 ng/mL — ABNORMAL HIGH (ref 24–336)

## 2024-03-26 LAB — URINALYSIS, W/ REFLEX TO CULTURE (INFECTION SUSPECTED)
Bilirubin Urine: NEGATIVE
Glucose, UA: NEGATIVE mg/dL
Hgb urine dipstick: NEGATIVE
Ketones, ur: NEGATIVE mg/dL
Leukocytes,Ua: NEGATIVE
Nitrite: NEGATIVE
Protein, ur: 30 mg/dL — AB
Specific Gravity, Urine: 1.016 (ref 1.005–1.030)
pH: 5 (ref 5.0–8.0)

## 2024-03-26 LAB — BASIC METABOLIC PANEL WITH GFR
Anion gap: 17 — ABNORMAL HIGH (ref 5–15)
BUN: 52 mg/dL — ABNORMAL HIGH (ref 8–23)
CO2: 21 mmol/L — ABNORMAL LOW (ref 22–32)
Calcium: 8.7 mg/dL — ABNORMAL LOW (ref 8.9–10.3)
Chloride: 100 mmol/L (ref 98–111)
Creatinine, Ser: 2.84 mg/dL — ABNORMAL HIGH (ref 0.61–1.24)
GFR, Estimated: 22 mL/min — ABNORMAL LOW (ref >60)
Glucose, Bld: 87 mg/dL (ref 70–99)
Potassium: 4.3 mmol/L (ref 3.5–5.1)
Sodium: 138 mmol/L (ref 135–145)

## 2024-03-26 LAB — TYPE AND SCREEN
ABO/RH(D): O POS
Antibody Screen: NEGATIVE

## 2024-03-26 LAB — HEPATIC FUNCTION PANEL
ALT: 42 U/L (ref 0–44)
AST: 108 U/L — ABNORMAL HIGH (ref 15–41)
Albumin: 3.3 g/dL — ABNORMAL LOW (ref 3.5–5.0)
Alkaline Phosphatase: 516 U/L — ABNORMAL HIGH (ref 38–126)
Bilirubin, Direct: 3.5 mg/dL — ABNORMAL HIGH (ref 0.0–0.2)
Indirect Bilirubin: 1.4 mg/dL — ABNORMAL HIGH (ref 0.3–0.9)
Total Bilirubin: 4.9 mg/dL — ABNORMAL HIGH (ref 0.0–1.2)
Total Protein: 5.9 g/dL — ABNORMAL LOW (ref 6.5–8.1)

## 2024-03-26 LAB — HEMOGLOBIN AND HEMATOCRIT, BLOOD
HCT: 28.7 % — ABNORMAL LOW (ref 39.0–52.0)
Hemoglobin: 9 g/dL — ABNORMAL LOW (ref 13.0–17.0)

## 2024-03-26 LAB — IRON AND TIBC
Iron: 46 ug/dL (ref 45–182)
Saturation Ratios: 21 % (ref 17.9–39.5)
TIBC: 221 ug/dL — ABNORMAL LOW (ref 250–450)
UIBC: 175 ug/dL

## 2024-03-26 LAB — TSH: TSH: 17.7 u[IU]/mL — ABNORMAL HIGH (ref 0.350–4.500)

## 2024-03-26 LAB — I-STAT CG4 LACTIC ACID, ED
Lactic Acid, Venous: 1.3 mmol/L (ref 0.5–1.9)
Lactic Acid, Venous: 1.4 mmol/L (ref 0.5–1.9)

## 2024-03-26 LAB — PATHOLOGIST SMEAR REVIEW

## 2024-03-26 LAB — PROTIME-INR
INR: 1.2 (ref 0.8–1.2)
Prothrombin Time: 15.5 s — ABNORMAL HIGH (ref 11.4–15.2)

## 2024-03-26 LAB — VITAMIN B12: Vitamin B-12: >4000 pg/mL — ABNORMAL HIGH (ref 180–914)

## 2024-03-26 LAB — APTT: aPTT: 40 s — ABNORMAL HIGH (ref 24–36)

## 2024-03-26 LAB — AMMONIA: Ammonia: 25 umol/L (ref 9–35)

## 2024-03-26 LAB — LACTATE DEHYDROGENASE
LDH: 1113 U/L — ABNORMAL HIGH (ref 105–235)
LDH: 1139 U/L — ABNORMAL HIGH (ref 105–235)

## 2024-03-26 LAB — FIBRINOGEN: Fibrinogen: 310 mg/dL (ref 210–475)

## 2024-03-26 LAB — LIPASE, BLOOD: Lipase: 77 U/L — ABNORMAL HIGH (ref 11–51)

## 2024-03-26 LAB — URIC ACID: Uric Acid, Serum: 15.7 mg/dL — ABNORMAL HIGH (ref 3.7–8.6)

## 2024-03-26 LAB — FOLATE: Folate: 13.2 ng/mL (ref >5.9)

## 2024-03-26 LAB — PHOSPHORUS: Phosphorus: 4.3 mg/dL (ref 2.5–4.6)

## 2024-03-26 MED ORDER — LATANOPROST 0.005 % OP SOLN
1.0000 [drp] | Freq: Every day | OPHTHALMIC | Status: DC
  Administered 2024-03-26 – 2024-03-30 (×5): 1 [drp] via OPHTHALMIC
  Filled 2024-03-26: qty 2.5

## 2024-03-26 MED ORDER — HYDROCODONE-ACETAMINOPHEN 5-325 MG PO TABS
1.0000 | ORAL_TABLET | ORAL | Status: DC | PRN
  Administered 2024-03-27 – 2024-03-31 (×5): 1 via ORAL
  Filled 2024-03-26 (×5): qty 1

## 2024-03-26 MED ORDER — MORPHINE SULFATE (PF) 2 MG/ML IV SOLN
2.0000 mg | INTRAVENOUS | Status: DC | PRN
  Administered 2024-03-26 – 2024-03-27 (×4): 2 mg via INTRAVENOUS
  Filled 2024-03-26 (×5): qty 1

## 2024-03-26 MED ORDER — CHLORHEXIDINE GLUCONATE CLOTH 2 % EX PADS
6.0000 | MEDICATED_PAD | Freq: Every day | CUTANEOUS | Status: DC
  Administered 2024-03-26 – 2024-03-30 (×4): 6 via TOPICAL

## 2024-03-26 MED ORDER — VANCOMYCIN HCL 1500 MG/300ML IV SOLN
1500.0000 mg | Freq: Once | INTRAVENOUS | Status: AC
  Administered 2024-03-26: 1500 mg via INTRAVENOUS
  Filled 2024-03-26: qty 300

## 2024-03-26 MED ORDER — PIPERACILLIN-TAZOBACTAM 3.375 G IVPB
3.3750 g | Freq: Two times a day (BID) | INTRAVENOUS | Status: DC
  Administered 2024-03-26: 3.375 g via INTRAVENOUS
  Filled 2024-03-26: qty 50

## 2024-03-26 MED ORDER — SODIUM CHLORIDE 0.9 % IV SOLN: INTRAVENOUS | Status: AC

## 2024-03-26 MED ORDER — PIPERACILLIN-TAZOBACTAM 3.375 G IVPB 30 MIN
3.3750 g | Freq: Once | INTRAVENOUS | Status: AC
  Administered 2024-03-26: 3.375 g via INTRAVENOUS
  Filled 2024-03-26: qty 50

## 2024-03-26 MED ORDER — ACETAMINOPHEN 325 MG PO TABS
650.0000 mg | ORAL_TABLET | Freq: Four times a day (QID) | ORAL | Status: DC | PRN
  Administered 2024-03-28 – 2024-03-29 (×2): 650 mg via ORAL
  Filled 2024-03-26 (×2): qty 2

## 2024-03-26 MED ORDER — ACETAMINOPHEN 650 MG RE SUPP
650.0000 mg | Freq: Four times a day (QID) | RECTAL | Status: DC | PRN
Start: 2024-03-26 — End: 2024-03-31

## 2024-03-26 MED ORDER — VANCOMYCIN VARIABLE DOSE PER UNSTABLE RENAL FUNCTION (PHARMACIST DOSING): Status: DC

## 2024-03-26 MED ORDER — ONDANSETRON HCL 4 MG PO TABS: 4.0000 mg | ORAL_TABLET | Freq: Four times a day (QID) | ORAL | Status: DC | PRN

## 2024-03-26 MED ORDER — SODIUM CHLORIDE 0.9% FLUSH
3.0000 mL | Freq: Two times a day (BID) | INTRAVENOUS | Status: DC
  Administered 2024-03-26 – 2024-03-30 (×9): 3 mL via INTRAVENOUS

## 2024-03-26 MED ORDER — ONDANSETRON HCL 4 MG/2ML IJ SOLN
4.0000 mg | Freq: Four times a day (QID) | INTRAMUSCULAR | Status: DC | PRN
  Administered 2024-03-27 – 2024-03-29 (×3): 4 mg via INTRAVENOUS
  Filled 2024-03-26 (×3): qty 2

## 2024-03-26 MED ORDER — PANTOPRAZOLE SODIUM 40 MG IV SOLR
40.0000 mg | INTRAVENOUS | Status: DC
  Administered 2024-03-26 – 2024-03-30 (×5): 40 mg via INTRAVENOUS
  Filled 2024-03-26 (×5): qty 10

## 2024-03-26 MED ORDER — PNEUMOCOCCAL 20-VAL CONJ VACC 0.5 ML IM SUSY
0.5000 mL | PREFILLED_SYRINGE | INTRAMUSCULAR | Status: DC
  Filled 2024-03-26: qty 0.5

## 2024-03-26 NOTE — Telephone Encounter (Signed)
 CRITICAL VALUE STICKER  CRITICAL VALUE: WMB 55.3  RECEIVER (on-site recipient of call):   DATE & TIME NOTIFIED: 11/13 0830  MESSENGER (representative from lab): [place], [place]  MD NOTIFIED: I[place]ku  TIME OF NOTIFICATION: 0845  RESPONSE: Will assess

## 2024-03-26 NOTE — ED Triage Notes (Signed)
 Brought over from cancer center for increased WBC. Ruled out leukemia, early sepsis, and abdominal infection. Pain is 8/10 in the lower regions. WBC 55.3

## 2024-03-26 NOTE — H&P (Signed)
 History and Physical    Patient: Jared [name] FMW:[id number] DOB: [birth year] DOA: 03/26/2024 DOS: the patient was seen and examined on 03/26/2024 . PCP: [name], FNP  Patient coming from: Oncology clinic  Chief complaint: Chief Complaint  Patient presents with   Abdominal Pain   HPI:  Jared [name] is a 78 y.o. male with past medical history  of   CLL MDS HTN CKD 3a diastolic HF, history of tobacco abuse quit in 1976 ,Who presented to ED 02/24/24 sent from heme-onc clinic for an elevated white count and neutrophils concerning for infection.  Patient appears dehydrated, has scleral icterus, abdominal distention consistent with ascites, abdomen is not taut but soft patient does have pressure on exam but no guarding organomegaly or tenderness.  ED Course:  Vital signs in the ED were notable for the following:  Vitals:   03/26/24 1109 03/26/24 1154 03/26/24 1330 03/26/24 1632  BP:  122/78 122/72 122/67  Pulse:  87 84 92  Temp:   97.7 F (36.5 C) 98.2 F (36.8 C)  Resp:  17 20 18   SpO2: 94% 91% 91% 99%  TempSrc:    Oral   >>ED evaluation thus far shows: Initial CBC shows white count of 55.3 hemoglobin of 9.4 platelets of 113. Initial CMP shows BUN of 51 CKD with a creatinine of 2.68 alk phos of 397, albumin of 3.0, AST of 79, total protein of 5.2, total bili of 5.5, LDH of 1113. Liver/biliary ultrasound as ordered by EDMD and pending.  >>While in the ED patient received the following: Medications  vancomycin (VANCOREADY) IVPB 1500 mg/300 mL (has no administration in time range)  piperacillin-tazobactam (ZOSYN) IVPB 3.375 g (3.375 g Intravenous New Bag/Given 03/26/24 1151)   Review of Systems  Constitutional:  Positive for malaise/fatigue.  Cardiovascular:  Positive for leg swelling.  Gastrointestinal:  Positive for abdominal pain, nausea and vomiting.   Past Medical History:  Diagnosis Date   Arthritis    knees and back   Enlarged prostate    takes Flomax     Headache    Hypertension    stress test done about 12 yrs ago - results wnl per pt.   Past Surgical History:  Procedure Laterality Date   BACK SURGERY  2002   CORONARY PRESSURE/FFR STUDY N/A 04/23/2023   Procedure: CORONARY PRESSURE/FFR STUDY;  Surgeon: Elmira Newman PARAS, MD;  Location: [facility];  Service: Cardiovascular;  Laterality: N/A;   ESOPHAGOGASTRODUODENOSCOPY N/A 02/27/2024   Procedure: EGD (ESOPHAGOGASTRODUODENOSCOPY);  Surgeon: Elicia Claw, MD;  Location: [facility];  Service: Gastroenterology;  Laterality: N/A;   JOINT REPLACEMENT     KNEE ARTHROSCOPY Left 05/2004   thelbert 09/27/2010   RIGHT/LEFT HEART CATH AND CORONARY ANGIOGRAPHY N/A 04/23/2023   Procedure: RIGHT/LEFT HEART CATH AND CORONARY ANGIOGRAPHY;  Surgeon: Elmira Newman PARAS, MD;  Location: [facility];  Service: Cardiovascular;  Laterality: N/A;   SHOULDER ARTHROSCOPY WITH OPEN ROTATOR CUFF REPAIR Right 2009   /notes 09/13/2010   TOTAL HIP ARTHROPLASTY Left 10/02/2017   TOTAL HIP ARTHROPLASTY Left 10/02/2017   Procedure: LEFT TOTAL HIP ARTHROPLASTY ANTERIOR APPROACH;  Surgeon: Liam Lerner, MD;  Location: [facility];  Service: Orthopedics;  Laterality: Left;   TOTAL KNEE ARTHROPLASTY  04/16/2011   Procedure: TOTAL KNEE ARTHROPLASTY;  Surgeon: Lerner PARAS Liam;  Location: [facility];  Service: Orthopedics;  Laterality: Left;  LEFT TOTAL KNEE ARTHROPLASTY    TOTAL KNEE ARTHROPLASTY Right 04/19/2014   Procedure: RIGHT TOTAL KNEE ARTHROPLASTY;  Surgeon: Dempsey JINNY Sensor, MD;  Location: [facility] OR;  Service: Orthopedics;  Laterality: Right;    reports that he quit smoking about 49 years ago. His smoking use included cigarettes. He started smoking about 59 years ago. He has a 10 pack-year smoking history. He has never used smokeless tobacco. He reports that he does not drink alcohol and does not use drugs. No Known Allergies Family History  Problem Relation Age of Onset   Migraines Mother    Cancer Sister     Cancer Sister    Prior to Admission medications   Medication Sig Start Date End Date Taking? Authorizing Provider  acetaminophen  (TYLENOL ) 650 MG CR tablet Take 1,300 mg by mouth every 8 (eight) hours as needed for pain.    [provider]  amLODipine  (NORVASC ) 10 MG tablet Take 10 mg by mouth daily.    [provider]  aspirin  EC (ASPIRIN  LOW DOSE) 81 MG tablet TAKE 1 TABLET (81 MG TOTAL) BY MOUTH DAILY. SWALLOW WHOLE. 09/05/23   Patwardhan, Newman JINNY, MD  atorvastatin (LIPITOR) 10 MG tablet Take 10 mg by mouth daily. 12/22/21   [provider]  Azelastine HCl 137 MCG/SPRAY SOLN Place 1 spray into both nostrils every 12 (twelve) hours as needed (for allergies or rhinitis). 05/15/23   [provider]  brimonidine  (ALPHAGAN ) 0.2 % ophthalmic solution Place 1 drop into the left eye 2 (two) times daily.    [provider]  dorzolamide -timolol  (COSOPT ) 2-0.5 % ophthalmic solution Place 1 drop into the left eye 2 (two) times daily.    [provider]  empagliflozin (JARDIANCE) 10 MG TABS tablet Take 10 mg by mouth daily.    [provider]  isosorbide  mononitrate (IMDUR ) 30 MG 24 hr tablet Take 0.5 tablets (15 mg total) by mouth daily. You can take an additional 15 mg as needed for chest pain. Patient taking differently: Take 15 mg by mouth See admin instructions. Take 15 mg by mouth once a day and an additional 15 mg as needed for chest pain 05/17/23 02/26/24  Wyn Jackee VEAR Mickey., NP  latanoprost  (XALATAN ) 0.005 % ophthalmic solution Place 1 drop into the left eye at bedtime.    [provider]  lidocaine  (LIDODERM ) 5 % Place 1 patch onto the skin daily as needed (for pain- Remove & Discard patch within 12 hours or as directed by MD).    [provider]  lisinopril  (ZESTRIL ) 20 MG tablet Take 1 tablet (20 mg total) by mouth at bedtime. 05/29/23   Wyn Jackee VEAR Mickey., NP  nitroGLYCERIN  (NITROSTAT ) 0.4 MG SL tablet Place 1 tablet (0.4  mg total) under the tongue every 5 (five) minutes as needed for chest pain. 04/17/23   Patwardhan, Newman JINNY, MD  pantoprazole  (PROTONIX ) 40 MG tablet Take 1 tablet (40 mg total) by mouth 2 (two) times daily for 56 days, THEN 1 tablet (40 mg total) daily for 28 days. 03/08/24 05/31/24  [place], Camellia JINNY, DO  simethicone (MYLICON) 80 MG chewable tablet Chew 1 tablet (80 mg total) by mouth every 6 (six) hours as needed for flatulence. 03/08/24   [place], Eric J, DO  sodium bicarbonate 650 MG tablet Take 1 tablet (650 mg total) by mouth 2 (two) times daily. 03/08/24 06/06/24  [place], Eric J, DO  tamsulosin  (FLOMAX ) 0.4 MG CAPS capsule Take 0.4 mg by mouth at bedtime.    [provider]  Vitals:   03/26/24 1109 03/26/24 1154 03/26/24 1330 03/26/24 1632  BP:  122/78 122/72 122/67  Pulse:  87 84 92  Resp:  17 20 18   Temp:   97.7 F (36.5 C) 98.2 F (36.8 C)  TempSrc:    Oral  SpO2: 94% 91% 91% 99%   Physical Exam Vitals reviewed.  Constitutional:      General: He is not in acute distress.    Appearance: He is ill-appearing.  HENT:     Head: Normocephalic.  Eyes:     General: Scleral icterus present.     Extraocular Movements: Extraocular movements intact.  Cardiovascular:     Rate and Rhythm: Normal rate and regular rhythm.     Pulses: Normal pulses.     Heart sounds: Normal heart sounds.  Pulmonary:     Effort: Pulmonary effort is normal.     Breath sounds: Normal breath sounds.  Abdominal:     General: There is distension.     Palpations: Abdomen is soft. There is no hepatomegaly or splenomegaly.     Tenderness: There is no abdominal tenderness.  Genitourinary:    Testes:        Right: Swelling not present.        Left: Swelling not present.  Musculoskeletal:     Right lower leg: No edema.     Left lower leg: No edema.  Neurological:     General: No focal deficit present.     Mental Status:  He is alert and oriented to person, place, and time.     Labs on Admission: I have personally reviewed following labs and imaging studies CBC: Recent Labs  Lab 03/26/24 0820 03/26/24 1044 03/26/24 1047  WBC 55.3* 54.5*  --   NEUTROABS 49.8* 51.2*  --   HGB 9.4* 9.6* 11.9*  HCT 29.4* 31.4* 35.0*  MCV 92.7 97.8  --   PLT 113* 115*  --    Basic Metabolic Panel: Recent Labs  Lab 03/26/24 0909 03/26/24 1044 03/26/24 1047 03/26/24 1103  NA 139 138 136  --   K 3.9 4.3 4.1  --   CL 102 100 103  --   CO2 22 21*  --   --   GLUCOSE 82 87 90  --   BUN 51* 52* 49*  --   CREATININE 2.68* 2.84* 3.10*  --   CALCIUM 8.2* 8.7*  --   --   PHOS  --   --   --  4.3   GFR: Estimated Creatinine Clearance: 20.2 mL/min (A) (by C-G formula based on SCr of 3.1 mg/dL (H)). Liver Function Tests: Recent Labs  Lab 03/26/24 0909 03/26/24 1044  AST 79* 108*  ALT 32 42  ALKPHOS 397* 516*  BILITOT 5.5* 4.9*  PROT 5.2* 5.9*  ALBUMIN 3.0* 3.3*   Recent Labs  Lab 03/26/24 1044  LIPASE 77*   Recent Labs  Lab 03/26/24 1044  AMMONIA 25   Recent Labs    03/03/24 0417 03/04/24 0431 03/05/24 0420 03/06/24 0406 03/07/24 0436 03/08/24 0444 03/19/24 1001 03/26/24 0909 03/26/24 1044 03/26/24 1047  BUN 37* 37* 41* 41* 39* 36* 49* 51* 52* 49*  CREATININE 2.43* 2.75* 3.03* 3.20* 2.82* 2.62* 2.62* 2.68* 2.84* 3.10*    Cardiac Enzymes: No results for input(s): CKTOTAL, CKMB, CKMBINDEX, TROPONINI in the last 168 hours. BNP (last 3 results) Recent Labs    04/17/23 1042  PROBNP 635*   HbA1C: No results for input(s): HGBA1C in the last 72  hours. CBG: No results for input(s): GLUCAP in the last 168 hours. Lipid Profile: No results for input(s): CHOL, HDL, LDLCALC, TRIG, CHOLHDL, LDLDIRECT in the last 72 hours. Thyroid  Function Tests: Recent Labs    03/26/24 0820  TSH 17.700*   Anemia Panel: Recent Labs    03/26/24 1456  VITAMINB12 >4,000*  FOLATE 13.2   FERRITIN 2,842*  TIBC 221*  IRON 46  RETICCTPCT 6.8*   Urine analysis:    Component Value Date/Time   COLORURINE AMBER (A) 03/26/2024 1334   APPEARANCEUR HAZY (A) 03/26/2024 1334   LABSPEC 1.016 03/26/2024 1334   PHURINE 5.0 03/26/2024 1334   GLUCOSEU NEGATIVE 03/26/2024 1334   HGBUR NEGATIVE 03/26/2024 1334   BILIRUBINUR NEGATIVE 03/26/2024 1334   KETONESUR NEGATIVE 03/26/2024 1334   PROTEINUR 30 (A) 03/26/2024 1334   NITRITE NEGATIVE 03/26/2024 1334   LEUKOCYTESUR NEGATIVE 03/26/2024 1334   Radiological Exams on Admission: US  Abdomen Limited RUQ (LIVER/GB) Result Date: 03/26/2024 CLINICAL DATA:  Abdominal pain. EXAM: ULTRASOUND ABDOMEN LIMITED RIGHT UPPER QUADRANT COMPARISON:  CT dated 03/26/2024. FINDINGS: Gallbladder: There is stone and layering sludge within the gallbladder. The gallbladder wall is mildly thickened measuring 5 mm in thickness, possibly related to ascites. Negative sonographic Murphy's sign. Common bile duct: Diameter: 8 mm Liver: The liver demonstrates a normal echogenicity. There is apparent minimal irregularity of the liver contour which may represent changes of cirrhosis. Clinical correlation recommended. Portal vein is patent on color Doppler imaging with normal direction of blood flow towards the liver. Other: Moderate ascites noted. IMPRESSION: 1. Gallbladder stone and sludge. No sonographic evidence of acute cholecystitis. 2. Moderate ascites. 3. Questionable cirrhosis.  Clinical correlation is recommended. Electronically Signed   By: Vanetta Chou M.D.   On: 03/26/2024 14:03   CT CHEST ABDOMEN PELVIS WO CONTRAST Result Date: 03/26/2024 EXAM: CT CHEST, ABDOMEN AND PELVIS WITHOUT CONTRAST 03/26/2024 11:41:11 AM TECHNIQUE: CT of the chest, abdomen and pelvis was performed without the administration of intravenous contrast. Multiplanar reformatted images are provided for review. Automated exposure control, iterative reconstruction, and/or weight based  adjustment of the mA/kV was utilized to reduce the radiation dose to as low as reasonably achievable. COMPARISON: CT of the abdomen and pelvis dated 03/01/2024. CLINICAL HISTORY: History of hemorrhagic gastritis. Distended abdomen. Likely ascites. Increased bilirubin level. FINDINGS: CHEST: MEDIASTINUM AND LYMPH NODES: There is moderate calcific coronary artery disease present. Pericardium is unremarkable. The central airways are clear. No mediastinal, hilar or axillary lymphadenopathy. There is moderate calcific atheromatous disease within the thoracic aorta. The esophagus is unremarkable. LUNGS AND PLEURA: There is a small patchy, reticular area of opacification/consolidation present anteriorly within the right upper lobe underlying the right first costosternal joint. There are patchy, streaky bronchovascular opacities present within the lung bases bilaterally. There is improved aeration of the lower lobes relative to the previous study. The left-sided pleural effusion has mildly worsened in the interim. No pneumothorax. ABDOMEN AND PELVIS: LIVER: The liver is unremarkable. GALLBLADDER AND BILE DUCTS: There is radiopaque material laying independently within the gallbladder. No biliary ductal dilatation. SPLEEN: The spleen is enlarged. PANCREAS: No acute abnormality. ADRENAL GLANDS: No acute abnormality. KIDNEYS, URETERS AND BLADDER: No stones in the kidneys or ureters. No hydronephrosis. No perinephric or periureteral stranding. Urinary bladder is unremarkable. GI AND BOWEL: Stomach demonstrates no acute abnormality. There is no bowel obstruction. REPRODUCTIVE ORGANS: No acute abnormality. PERITONEUM AND RETROPERITONEUM: There is moderate ascites present. No free air. VASCULATURE: Aorta is normal in caliber. ABDOMINAL AND PELVIS LYMPH NODES: No  lymphadenopathy. BONES AND SOFT TISSUES: The patient is status post left total hip arthroplasty. There are advanced degenerative changes within the lower lumbar spine. The  patient is status post decompression laminectomies. No acute osseous abnormality. No focal soft tissue abnormality. IMPRESSION: 1. Small patchy reticular opacification/consolidation in the right upper lobe and patchy streaky bronchovascular opacities at the lung bases bilaterally; aeration of the lower lobes has improved compared to 03/01/24 2. Mildly worsened left pleural effusion 3. Moderate ascites 4. Splenomegaly 5. Radiopaque material within the gallbladder, compatible with gallstones 6. Moderate calcific coronary artery disease and moderate calcific atheromatous disease of the thoracic aorta Electronically signed by: Evalene Coho MD 03/26/2024 12:18 PM EST RP Workstation: [id number]   DG Chest Port 1 View Result Date: 03/26/2024 CLINICAL DATA:  Possible sepsis. EXAM: PORTABLE CHEST 1 VIEW COMPARISON:  09/20/2017 FINDINGS: Lungs are hypoinflated with bibasilar opacification left worse than right likely small layering left effusion with bibasilar atelectasis. Infection in the lung bases is possible. Cardiomediastinal silhouette and remainder of the exam is unchanged. IMPRESSION: Hypoinflation with bibasilar opacification left worse than right likely small layering left effusion with bibasilar atelectasis. Infection in the lung bases is possible. Electronically Signed   By: Toribio Agreste M.D.   On: 03/26/2024 11:42   Data Reviewed: Relevant notes from primary care and specialist visits, past discharge summaries as available in EHR, including Care Everywhere . Prior diagnostic testing as pertinent to current admission diagnoses, Updated medications and problem lists for reconciliation .ED course, including vitals, labs, imaging, treatment and response to treatment,Triage notes, nursing and pharmacy notes and ED provider's notes.Notable results as noted in HPI.Discussed case with EDMD/ ED APP/ or Specialty MD on call and as needed.  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   CBC concerning with a white count rising from 23-54 in less than 7 days hemoglobin is stable , along with thrombocytopenia, marked elevated immature granulocytes with neutrophilic predominance. Peripheral smear is pending.  Elevated LDH. Flow cytometry was done on 6 November and we will repeat again today will order  uric acid phosphorus potassium serum creatinine and calcium. PT/INR PTT fibrinogen and D-dimer. Have requested oncology hematology consult urgently today.  Will discuss with them for need of allopurinol possible hydroxyurea. Continue with generous IV fluid rehydration. Will type and screen.  Blood cultures have been collected and patient has been started on broad-spectrum IV antibiotics to cover MRSA, gram-positive, gram-negative, anaerobes and atypical.   >> Essential hypertension: Vitals:   03/26/24 1010 03/26/24 1154 03/26/24 1330 03/26/24 1632  BP: 115/77 122/78 122/72 122/67     >>Chronic congestive diastolic heart failure: Patient is clinically euvolemic.  Cautious IV fluid rehydration. Strict I's and O's.  Daily weights.  Hold PTA Imdur , lisinopril , Farxiga.  >>Ascites: Ultrasound-guided paracentesis with no growth on peritoneal culture. Patient will need IR paracentesis in AM.  Will defer to a.m. team for ordering and following up.  Currently his abdomen is soft not tense.  >> AKI on CKD stage IIIb: Lab Results  Component Value Date   CREATININE 3.10 (H) 03/26/2024   CREATININE 2.84 (H) 03/26/2024   CREATININE 2.68 (H) 03/26/2024  Cont with low rate IVF. Hold meds and albumin prior to paracentesis.    >> History of gastritis: IV PPI. Aspiration precaution and NPO except for sips and clears as tolerated.    >>Anemia/ FOBT + stool/acute blood loss anemia: This  was patient's recent discharge from October 26 where he was hypotensive received  vasopressor therapy in the ICU, patient was found to have severe hemorrhagic gastritis with no evidence of ulcer . Hemoglobin today is stable :    Latest Ref Rng & Units 03/26/2024   10:47 AM 03/26/2024   10:44 AM 03/26/2024    8:20 AM  CBC  WBC 4.0 - 10.5 K/uL  54.5  55.3   Hemoglobin 13.0 - 17.0 g/dL 88.0  9.6  9.4   Hematocrit 39.0 - 52.0 % 35.0  31.4  29.4   Platelets 150 - 400 K/uL  115  113   IV PPIs, type and screen.  Hold aspirin .  DVT prophylaxis:  SCDs Consults:  Hematology oncology  Advance Care Planning:    Code Status: Full Code   Family Communication:  None Disposition Plan:  Home Severity of Illness: The appropriate patient status for this patient is INPATIENT. Inpatient status is judged to be reasonable and necessary in order to provide the required intensity of service to ensure the patient's safety. The patient's presenting symptoms, physical exam findings, and initial radiographic and laboratory data in the context of their chronic comorbidities is felt to place them at high risk for further clinical deterioration. Furthermore, it is not anticipated that the patient will be medically stable for discharge from the hospital within 2 midnights of admission.   * I certify that at the point of admission it is my clinical judgment that the patient will require inpatient hospital care spanning beyond 2 midnights from the point of admission due to high intensity of service, high risk for further deterioration and high frequency of surveillance required.*  Unresulted Labs (From admission, onward)     Start     Ordered   03/27/24 0500  Comprehensive metabolic panel  Tomorrow morning,   R        03/26/24 1350   03/27/24 0500  CBC  Tomorrow morning,   R        03/26/24 1350   03/26/24 1338  Flow Cytometry, Peripheral Blood (Oncology)  ONCE - URGENT,   URGENT        03/26/24 1337   03/26/24 1338  Fibrinogen  Once,   STAT        03/26/24 1337   03/26/24 1336  APTT   Add-on,   AD        03/26/24 1335   03/26/24 1253  MRSA Next Gen by PCR, Nasal  (MRSA Screening)  Once,   URGENT        03/26/24 1252   03/26/24 1025  Blood Culture (routine x 2)  (Undifferentiated presentation (screening labs and basic nursing orders))  BLOOD CULTURE X 2,   STAT      03/26/24 1024            Meds ordered this encounter  Medications   piperacillin-tazobactam (ZOSYN) IVPB 3.375 g    Antibiotic Indication::   Aspiration Pneumonia   vancomycin (VANCOREADY) IVPB 1500 mg/300 mL    Indication::   Pneumonia   sodium chloride  flush (NS) 0.9 % injection 3 mL   0.9 %  sodium chloride  infusion   OR Linked Order Group    acetaminophen  (TYLENOL ) tablet 650 mg    acetaminophen  (TYLENOL ) suppository 650 mg   HYDROcodone -acetaminophen  (NORCO/VICODIN) 5-325 MG per tablet 1 tablet    Refill:  0   morphine (PF) 2 MG/ML injection 2 mg   OR Linked Order Group    ondansetron  (  ZOFRAN ) tablet 4 mg    ondansetron  (ZOFRAN ) injection 4 mg   pneumococcal 20-valent conjugate vaccine (PREVNAR 20) injection 0.5 mL     Orders Placed This Encounter  Procedures   Blood Culture (routine x 2)   MRSA Next Gen by PCR, Nasal   DG Chest Port 1 View   CT CHEST ABDOMEN PELVIS WO CONTRAST   US  Abdomen Limited RUQ (LIVER/GB)   CBC with Differential   Protime-INR   Urinalysis, w/ Reflex to Culture (Infection Suspected) -Urine, Clean Catch   Lipase, blood   Basic metabolic panel   Hepatic function panel   Ammonia   Pathologist smear review   Lactate dehydrogenase   Uric acid   Phosphorus   APTT   D-dimer, quantitative   Flow Cytometry, Peripheral Blood (Oncology)   Vitamin B12   Folate   Iron and TIBC   Ferritin   Reticulocytes   Fibrinogen   Comprehensive metabolic panel   CBC   Diet NPO time specified Except for: Sips with Meds   Document height and weight   Assess and Document Glasgow Coma Scale   Document vital signs within 1-hour of fluid bolus completion.  Notify provider of  abnormal vital signs despite fluid resuscitation.   Refer to Sidebar Report: Sepsis Bundle ED/IP   Notify provider for difficulties obtaining IV access   Initiate Carrier Fluid Protocol   Maintain IV access   Vital signs   Notify physician (specify)   Mobility Protocol: No Restrictions   Refer to Sidebar Report Mobility Protocol for Adult Inpatient   Initiate Adult Central Line Maintenance and Catheter Clearance Protocol for patients with central line (CVC, PICC, Port, Hemodialysis, Trialysis)   Daily weights   Intake and Output   Initiate CHG Protocol for patients in ICU/SD or any patient with a central line or foley catheter   Do not place and if present remove PureWick   Initiate Oral Care Protocol   Initiate Carrier Fluid Protocol   RN may order General Admission PRN Orders utilizing General Admission PRN medications (through manage orders) for the following patient needs: allergy symptoms (Claritin), cold sores (Carmex), cough (Robitussin DM), eye irritation (Liquifilm Tears), hemorrhoids (Tucks), indigestion (Maalox), minor skin irritation (Hydrocortisone Cream), muscle pain Lucienne Gay), nose irritation (saline nasal spray) and sore throat (Chloraseptic spray).   SCDs   Cardiac Monitoring Continuous x 48 hours Indications for use: Other; Other indications for use: sepsis.   Ambulate with assistance   Strict intake and output   Full code   Consult to hospitalist   Consult to oncology   Pulse oximetry check with vital signs   Oxygen therapy Mode or (Route): Nasal cannula; Liters Per Minute: 2; Keep O2 saturation between: greater than 92 %   I-Stat Lactic Acid, ED   I-stat chem 8, ED   ED EKG   Type and screen [facility]   Insert peripheral IV X 1   Admit to Inpatient (patient's expected length of stay will be greater than 2 midnights or inpatient only procedure)   Aspiration precautions   Fall precautions    Author: Mario LULLA Blanch, MD 12 pm- 8 pm. [facility]. 03/26/2024 5:50 PM Please note for any communication after hours contact [facility] Assigned provider on call on Amion.

## 2024-03-26 NOTE — Hospital Course (Signed)
 Jared [name]

## 2024-03-26 NOTE — Telephone Encounter (Signed)
 CRITICAL VALUE STICKER  CRITICAL VALUE: total bilirubin 5.5  RECEIVER (on-site recipient of call):Estefania Kamiya, LPN  DATE & TIME NOTIFIED: 10:02  03/26/2024  MESSENGER (representative from lab):  MD NOTIFIED: Bretta Kendall, NP  TIME OF NOTIFICATION:10:03  RESPONSE:

## 2024-03-26 NOTE — ED Provider Notes (Signed)
 [place] [facility]ERGENC[facility] DEPARTMENT AT [facility] Provider Note   CSN: [id number] Arrival date & time: 03/26/24  9041     Patient presents with: Abdominal Pain   Jared [name] is a 78 y.o. male.    Abdominal Pain   Patient presents due to abdominal pain.  Fatigue.  No fever no chills.  Had 1 episode of emesis on Sunday.  Family was doing her regular.  No blood in his appreciated.  No diarrhea.  Endorsing generalized abdominal pain as well as increasing swelling.  No obvious dysuria.  Endorses a nonproductive cough.  No pleuritic chest pain or hemoptysis.  No current chest pain.  No obvious runny nose.  No sick contacts.  Does endorses fatigue and abdominal pain is his primary complaints.    Previous medical history reviewed : Patient was last discharged on March 08, 2024.past medical history significant for CLL/MDS, HTN, chronic diastolic congestive heart failure, CKD stage IIIa acute blood loss anemia.  Hemorrhagic shock.  Intra-abdominal wall versus gastric wall versus gastric hematoma.  Severe hemorrhagic gastritis.  She is a total of 4 units of packed red blood cells.  Upper GI series without no evidence of gastric perforation or other bowel leak.  EGD on October 16 showed findings of severe hemorrhagic gastritis in the gastric body of the stomach.  No obvious large ulcer or perforation.  Underwent paracentesis as well with 1.2 L of ascitic fluid removed.  No IV Protonix  twice daily.  Hemoglobin did stabilize.  Also had commune acquired pneumonia.  Acute renal failure on CKD.  Creatinine 1.7.  Per onc note today: Suspected infection Elevated WBC and neutrophils suggest infection. Differential includes abdominal or lung etiology. Emergency evaluation warranted due to age, recent hospitalization, and questionable early sepsis. - Referred to emergency room for evaluation and management. - My nurse Guerry called the [facility] charge nurse to coordinate.       Prior to Admission  medications   Medication Sig Start Date End Date Taking? Authorizing Provider  acetaminophen  (TYLENOL ) 650 MG CR tablet Take 1,300 mg by mouth every 8 (eight) hours as needed for pain.    [provider]  amLODipine  (NORVASC ) 10 MG tablet Take 10 mg by mouth daily.    [provider]  aspirin  EC (ASPIRIN  LOW DOSE) 81 MG tablet TAKE 1 TABLET (81 MG TOTAL) BY MOUTH DAILY. SWALLOW WHOLE. 09/05/23   Patwardhan, Newman PARAS, MD  atorvastatin (LIPITOR) 10 MG tablet Take 10 mg by mouth daily. 12/22/21   [provider]  Azelastine HCl 137 MCG/SPRAY SOLN Place 1 spray into both nostrils every 12 (twelve) hours as needed (for allergies or rhinitis). 05/15/23   [provider]  brimonidine  (ALPHAGAN ) 0.2 % ophthalmic solution Place 1 drop into the left eye 2 (two) times daily.    [provider]  dorzolamide -timolol  (COSOPT ) 2-0.5 % ophthalmic solution Place 1 drop into the left eye 2 (two) times daily.    [provider]  empagliflozin (JARDIANCE) 10 MG TABS tablet Take 10 mg by mouth daily.    [provider]  isosorbide  mononitrate (IMDUR ) 30 MG 24 hr tablet Take 0.5 tablets (15 mg total) by mouth daily. You can take an additional 15 mg as needed for chest pain. Patient taking differently: Take 15 mg by mouth See admin instructions. Take 15 mg by mouth once a day and an additional 15 mg as needed for chest pain 05/17/23 02/26/24  Wyn Jackee VEAR Mickey., NP  latanoprost  (XALATAN )  0.005 % ophthalmic solution Place 1 drop into the left eye at bedtime.    [provider]  lidocaine  (LIDODERM ) 5 % Place 1 patch onto the skin daily as needed (for pain- Remove & Discard patch within 12 hours or as directed by MD).    [provider]  lisinopril  (ZESTRIL ) 20 MG tablet Take 1 tablet (20 mg total) by mouth at bedtime. 05/29/23   Wyn Jackee VEAR Mickey., NP  nitroGLYCERIN  (NITROSTAT ) 0.4 MG SL tablet Place 1 tablet (0.4 mg total) under the tongue every 5  (five) minutes as needed for chest pain. 04/17/23   Patwardhan, Newman PARAS, MD  pantoprazole  (PROTONIX ) 40 MG tablet Take 1 tablet (40 mg total) by mouth 2 (two) times daily for 56 days, THEN 1 tablet (40 mg total) daily for 28 days. 03/08/24 05/31/24  [place], Camellia PARAS, DO  simethicone (MYLICON) 80 MG chewable tablet Chew 1 tablet (80 mg total) by mouth every 6 (six) hours as needed for flatulence. 03/08/24   [place], Eric J, DO  sodium bicarbonate 650 MG tablet Take 1 tablet (650 mg total) by mouth 2 (two) times daily. 03/08/24 06/06/24  [place], Eric J, DO  tamsulosin  (FLOMAX ) 0.4 MG CAPS capsule Take 0.4 mg by mouth at bedtime.    [provider]    Allergies: Patient has no known allergies.    Review of Systems  Gastrointestinal:  Positive for abdominal pain.    Updated Vital Signs BP 122/72   Pulse 84   Temp 97.7 F (36.5 C)   Resp 20   SpO2 91%   Physical Exam Vitals and nursing note reviewed.  Constitutional:      General: He is not in acute distress.    Appearance: He is well-developed.  HENT:     Head: Normocephalic and atraumatic.  Eyes:     Conjunctiva/sclera: Conjunctivae normal.  Cardiovascular:     Rate and Rhythm: Normal rate and regular rhythm.     Heart sounds: No murmur heard. Pulmonary:     Effort: Pulmonary effort is normal. No respiratory distress.     Breath sounds: Normal breath sounds.  Abdominal:     Palpations: Abdomen is soft.     Tenderness: There is no abdominal tenderness.     Comments: Generalized abd pain to palpation. Mild amount of fluid.   Musculoskeletal:        General: No swelling.     Cervical back: Neck supple.  Skin:    General: Skin is warm and dry.     Capillary Refill: Capillary refill takes less than 2 seconds.  Neurological:     Mental Status: He is alert.  Psychiatric:        Mood and Affect: Mood normal.     (all labs ordered are listed, but only abnormal results are displayed) Labs Reviewed  CBC WITH  DIFFERENTIAL/PLATELET - Abnormal; Notable for the following components:      Result Value   WBC 54.5 (*)    RBC 3.21 (*)    Hemoglobin 9.6 (*)    HCT 31.4 (*)    RDW 30.0 (*)    Platelets 115 (*)    nRBC 7.7 (*)    Neutro Abs 51.2 (*)    Basophils Absolute 0.5 (*)    Abs Immature Granulocytes 1.10 (*)    All other components within normal limits  PROTIME-INR - Abnormal; Notable for the following components:   Prothrombin Time 15.5 (*)    All other components within  normal limits  URINALYSIS, W/ REFLEX TO CULTURE (INFECTION SUSPECTED) - Abnormal; Notable for the following components:   Color, Urine AMBER (*)    APPearance HAZY (*)    Protein, ur 30 (*)    Bacteria, UA RARE (*)    All other components within normal limits  LIPASE, BLOOD - Abnormal; Notable for the following components:   Lipase 77 (*)    All other components within normal limits  BASIC METABOLIC PANEL WITH GFR - Abnormal; Notable for the following components:   CO2 21 (*)    BUN 52 (*)    Creatinine, Ser 2.84 (*)    Calcium 8.7 (*)    GFR, Estimated 22 (*)    Anion gap 17 (*)    All other components within normal limits  HEPATIC FUNCTION PANEL - Abnormal; Notable for the following components:   Total Protein 5.9 (*)    Albumin 3.3 (*)    AST 108 (*)    Alkaline Phosphatase 516 (*)    Total Bilirubin 4.9 (*)    Bilirubin, Direct 3.5 (*)    Indirect Bilirubin 1.4 (*)    All other components within normal limits  LACTATE DEHYDROGENASE - Abnormal; Notable for the following components:   LDH 1,139 (*)    All other components within normal limits  URIC ACID - Abnormal; Notable for the following components:   Uric Acid, Serum 15.7 (*)    All other components within normal limits  RETICULOCYTES - Abnormal; Notable for the following components:   Retic Ct Pct 6.8 (*)    RBC. 2.80 (*)    Retic Count, Absolute 190.0 (*)    Immature Retic Fract 26.2 (*)    All other components within normal limits  I-STAT CHEM  8, ED - Abnormal; Notable for the following components:   BUN 49 (*)    Creatinine, Ser 3.10 (*)    Calcium, Ion 1.11 (*)    TCO2 21 (*)    Hemoglobin 11.9 (*)    HCT 35.0 (*)    All other components within normal limits  CULTURE, BLOOD (ROUTINE X 2)  CULTURE, BLOOD (ROUTINE X 2)  MRSA NEXT GEN BY PCR, NASAL  AMMONIA  PHOSPHORUS  PATHOLOGIST SMEAR REVIEW  APTT  D-DIMER, QUANTITATIVE  FLOW CYTOMETRY  VITAMIN B12  FOLATE  IRON AND TIBC  FERRITIN  FIBRINOGEN  I-STAT CG4 LACTIC ACID, ED  I-STAT CG4 LACTIC ACID, ED  TYPE AND SCREEN    EKG: EKG Interpretation Date/Time:  Thursday March 26 2024 11:18:49 EST Ventricular Rate:  90 PR Interval:  163 QRS Duration:  70 QT Interval:  325 QTC Calculation: 396 R Axis:   -23  Text Interpretation: Sinus rhythm Ventricular premature complex RSR' in V1 or V2, probably normal variant Inferior infarct, old Confirmed by Simon Rea [phone]) on 03/26/2024 12:41:58 PM  Radiology: US  Abdomen Limited RUQ (LIVER/GB) Result Date: 03/26/2024 CLINICAL DATA:  Abdominal pain. EXAM: ULTRASOUND ABDOMEN LIMITED RIGHT UPPER QUADRANT COMPARISON:  CT dated 03/26/2024. FINDINGS: Gallbladder: There is stone and layering sludge within the gallbladder. The gallbladder wall is mildly thickened measuring 5 mm in thickness, possibly related to ascites. Negative sonographic Murphy's sign. Common bile duct: Diameter: 8 mm Liver: The liver demonstrates a normal echogenicity. There is apparent minimal irregularity of the liver contour which may represent changes of cirrhosis. Clinical correlation recommended. Portal vein is patent on color Doppler imaging with normal direction of blood flow towards the liver. Other: Moderate ascites noted. IMPRESSION: 1. Gallbladder  stone and sludge. No sonographic evidence of acute cholecystitis. 2. Moderate ascites. 3. Questionable cirrhosis.  Clinical correlation is recommended. Electronically Signed   By: Vanetta Chou M.D.   On:  03/26/2024 14:03   CT CHEST ABDOMEN PELVIS WO CONTRAST Result Date: 03/26/2024 EXAM: CT CHEST, ABDOMEN AND PELVIS WITHOUT CONTRAST 03/26/2024 11:41:11 AM TECHNIQUE: CT of the chest, abdomen and pelvis was performed without the administration of intravenous contrast. Multiplanar reformatted images are provided for review. Automated exposure control, iterative reconstruction, and/or weight based adjustment of the mA/kV was utilized to reduce the radiation dose to as low as reasonably achievable. COMPARISON: CT of the abdomen and pelvis dated 03/01/2024. CLINICAL HISTORY: History of hemorrhagic gastritis. Distended abdomen. Likely ascites. Increased bilirubin level. FINDINGS: CHEST: MEDIASTINUM AND LYMPH NODES: There is moderate calcific coronary artery disease present. Pericardium is unremarkable. The central airways are clear. No mediastinal, hilar or axillary lymphadenopathy. There is moderate calcific atheromatous disease within the thoracic aorta. The esophagus is unremarkable. LUNGS AND PLEURA: There is a small patchy, reticular area of opacification/consolidation present anteriorly within the right upper lobe underlying the right first costosternal joint. There are patchy, streaky bronchovascular opacities present within the lung bases bilaterally. There is improved aeration of the lower lobes relative to the previous study. The left-sided pleural effusion has mildly worsened in the interim. No pneumothorax. ABDOMEN AND PELVIS: LIVER: The liver is unremarkable. GALLBLADDER AND BILE DUCTS: There is radiopaque material laying independently within the gallbladder. No biliary ductal dilatation. SPLEEN: The spleen is enlarged. PANCREAS: No acute abnormality. ADRENAL GLANDS: No acute abnormality. KIDNEYS, URETERS AND BLADDER: No stones in the kidneys or ureters. No hydronephrosis. No perinephric or periureteral stranding. Urinary bladder is unremarkable. GI AND BOWEL: Stomach demonstrates no acute abnormality.  There is no bowel obstruction. REPRODUCTIVE ORGANS: No acute abnormality. PERITONEUM AND RETROPERITONEUM: There is moderate ascites present. No free air. VASCULATURE: Aorta is normal in caliber. ABDOMINAL AND PELVIS LYMPH NODES: No lymphadenopathy. BONES AND SOFT TISSUES: The patient is status post left total hip arthroplasty. There are advanced degenerative changes within the lower lumbar spine. The patient is status post decompression laminectomies. No acute osseous abnormality. No focal soft tissue abnormality. IMPRESSION: 1. Small patchy reticular opacification/consolidation in the right upper lobe and patchy streaky bronchovascular opacities at the lung bases bilaterally; aeration of the lower lobes has improved compared to 03/01/24 2. Mildly worsened left pleural effusion 3. Moderate ascites 4. Splenomegaly 5. Radiopaque material within the gallbladder, compatible with gallstones 6. Moderate calcific coronary artery disease and moderate calcific atheromatous disease of the thoracic aorta Electronically signed by: Evalene Coho MD 03/26/2024 12:18 PM EST RP Workstation: [id number]   DG Chest Port 1 View Result Date: 03/26/2024 CLINICAL DATA:  Possible sepsis. EXAM: PORTABLE CHEST 1 VIEW COMPARISON:  09/20/2017 FINDINGS: Lungs are hypoinflated with bibasilar opacification left worse than right likely small layering left effusion with bibasilar atelectasis. Infection in the lung bases is possible. Cardiomediastinal silhouette and remainder of the exam is unchanged. IMPRESSION: Hypoinflation with bibasilar opacification left worse than right likely small layering left effusion with bibasilar atelectasis. Infection in the lung bases is possible. Electronically Signed   By: Toribio Agreste M.D.   On: 03/26/2024 11:42     Procedures   Medications Ordered in the ED  vancomycin (VANCOREADY) IVPB 1500 mg/300 mL (1,500 mg Intravenous New Bag/Given 03/26/24 1344)  sodium chloride  flush (NS) 0.9 % injection 3  mL (has no administration in time range)  0.9 %  sodium chloride  infusion (has  no administration in time range)  acetaminophen  (TYLENOL ) tablet 650 mg (has no administration in time range)    Or  acetaminophen  (TYLENOL ) suppository 650 mg (has no administration in time range)  HYDROcodone -acetaminophen  (NORCO/VICODIN) 5-325 MG per tablet 1 tablet (has no administration in time range)  morphine (PF) 2 MG/ML injection 2 mg (has no administration in time range)  ondansetron  (ZOFRAN ) tablet 4 mg (has no administration in time range)    Or  ondansetron  (ZOFRAN ) injection 4 mg (has no administration in time range)  piperacillin-tazobactam (ZOSYN) IVPB 3.375 g (0 g Intravenous Stopped 03/26/24 1221)    Clinical Course as of 03/26/24 1542  Thu Mar 26, 2024  1144 12/31/23 16:01 Total Bilirubin: 1.4 (H)  02/24/24 14:46 Total Bilirubin: 1.1  02/26/24 03:09 Total Bilirubin: 1.3 (H)  03/26/24 09:09 Total Bilirubin: 5.5 (HH)  03/26/24 10:44 Total Bilirubin: 4.9 (H)   (HH): Data is critically high (H): Data is abnormally high [TL]    Clinical Course User Index [TL] Simon Lavonia SAILOR, MD                                 Medical Decision Making Amount and/or Complexity of Data Reviewed Labs: ordered. Radiology: ordered.  Risk Prescription drug management. Decision regarding hospitalization.     HPI:    Patient presents due to abdominal pain.  Fatigue.  No fever no chills.  Had 1 episode of emesis on Sunday.  Family was doing her regular.  No blood in his appreciated.  No diarrhea.  Endorsing generalized abdominal pain as well as increasing swelling.  No obvious dysuria.  Endorses a nonproductive cough.  No pleuritic chest pain or hemoptysis.  No current chest pain.  No obvious runny nose.  No sick contacts.  Does endorses fatigue and abdominal pain is his primary complaints.    Previous medical history reviewed : Patient was last discharged on March 08, 2024.past medical  history significant for CLL/MDS, HTN, chronic diastolic congestive heart failure, CKD stage IIIa acute blood loss anemia.  Hemorrhagic shock.  Intra-abdominal wall versus gastric wall versus gastric hematoma.  Severe hemorrhagic gastritis.  She is a total of 4 units of packed red blood cells.  Upper GI series without no evidence of gastric perforation or other bowel leak.  EGD on October 16 showed findings of severe hemorrhagic gastritis in the gastric body of the stomach.  No obvious large ulcer or perforation.  Underwent paracentesis as well with 1.2 L of ascitic fluid removed.  No IV Protonix  twice daily.  Hemoglobin did stabilize.  Also had commune acquired pneumonia.  Acute renal failure on CKD.  Creatinine 1.7.  Per onc note today: Suspected infection Elevated WBC and neutrophils suggest infection. Differential includes abdominal or lung etiology. Emergency evaluation warranted due to age, recent hospitalization, and questionable early sepsis. - Referred to emergency room for evaluation and management. - My nurse Guerry called the [facility] charge nurse to coordinate.  MDM:   Upon exam, patient hemodynamic stable.  ANO x 3 GCS 15.  Afebrile.  Normotensive.  Tender abdomen.  Slight distended as well.  Seems to be ascites.  Patient was sent over as above because of elevated white blood cell count with neutrophils concern for infection from oncology service.  Recommended further workup.  Obtaining sepsis workup.  Reevaluation:   Upon reexamination, patient hemodynamically stable.  Remains A&O x 3 with GCS 15.  Patient does have significant leukocytosis  of white blood cell count of 54,000.  Increased neutrophil count as well.  Given this is diffuse tenderness, I did start her on Zosyn to cover for any kind of intra-abdominal pathology soon after asked for saw the patient.  After chest x-ray came back as possible opacification consistent for pneumonia.  Also started on vancomycin to cover for  hospital-acquired pneumonia given recent hospital admission.  Will be selective minimally fluid resuscitation given patient's age and current vital signs being stable.  Do not want to fluid overload the patient.  Showed stable hemoglobin.  No concerns for hemorrhagic gastritis at this point time.   Patient did have elevated LFTs as well as alk phos and total bilirubin with increased direct bilirubin.  Therefore, I did obtain CT scan of the abdomen without contrast.  Without contrast in the setting of patient's CKD.  Did show gallstones but no obvious biliary dilation to be concerning for choledocholithiasis at this point time.  Patient has generalized pain to palpation.  Would not see he has a positive Murphy sign.  Did order a right upper quad ultrasound that medicine will follow to make sure there is no evidence of acute cholecystitis or evidence of CBD dilation.  Lipase is slightly elevated as well.  The scan consistent for pneumonia in the right upper lobe.  Once again, patient already started on Zosyn and vancomycin for broad-spectrum coverage.  Blood culture sent already been ordered as well.   Spoke to the hospitalist.  Agreed to admission.  Dr. Tobie.  Also consulted the oncology team.  They wanted to send off a pathology smear to make sure there is no evidence of significant blast burden.  I did send this relatively quick so that way the pathologist can look at the labs today.  He does have elevated LDH.  This is communicated to the nurse that was able to send off the labs so that way the pathologist can review the smear.  Questionable cirrhosis.  Could explain patient's increased LFTs as well as bilirubin.   Patient admitted for further care.   Interventions: vancomycin, zosyn  EKG Interpreted by Me: sinus    Cardiac Tele Interpreted by Me: sinus    I have independently interpreted the CXR  and CT  images and agree with the radiologist finding   Social Determinant of Health:  Denies drugs/alcohol    Disposition and Follow Up: admit    CRITICAL CARE Performed by: Lavonia LOISE Pat   Total critical care time: 44 minutes  Critical care time was exclusive of separately billable procedures and treating other patients.  Critical care was necessary to treat or prevent imminent or life-threatening deterioration.  Critical care was time spent personally by me on the following activities: development of treatment plan with patient and/or surrogate as well as nursing, discussions with consultants, evaluation of patient's response to treatment, examination of patient, obtaining history from patient or surrogate, ordering and performing treatments and interventions, ordering and review of laboratory studies, ordering and review of radiographic studies, pulse oximetry and re-evaluation of patient's condition.      Final diagnoses:  Leukocytosis, unspecified type  Pneumonia of right upper lobe due to infectious organism  Generalized abdominal pain  Calculus of gallbladder without cholecystitis without obstruction    ED Discharge Orders     None          Pat Lavonia LOISE, MD 03/26/24 1542

## 2024-03-26 NOTE — ED Notes (Signed)
 Lab to add on Uric acid, LDH, and Phosphorus levels

## 2024-03-26 NOTE — Progress Notes (Addendum)
 ED Pharmacy Antibiotic Sign Off An antibiotic consult was received from an ED provider for vancomycin per pharmacy dosing for pneumonia. A chart review was completed to assess appropriateness.  The following one time order(s) were placed:  Vancomycin 1500mg  IV x1  Further antibiotic and/or antibiotic pharmacy consults should be ordered by the admitting provider if indicated.   Thank you for allowing pharmacy to be a part of this patient's care.   Lacinda Moats, PharmD Clinical Pharmacist  11/13/202512:48 PM   Addendum: MD/overnight APP wish to continue antibiotics for now pending sepsis workup. Given patient's unstable renal function, will not need another vancomycin dose for ~48hrs with Scr of 3.10. Pharmacy will re-evaluate renal function tomorrow AM to see if vancomycin plan needs adjusting. Zosyn 3.375g IV q12h ordered with CrCl borderline 20ml/min.

## 2024-03-27 ENCOUNTER — Inpatient Hospital Stay (HOSPITAL_COMMUNITY)

## 2024-03-27 DIAGNOSIS — D696 Thrombocytopenia, unspecified: Secondary | ICD-10-CM | POA: Diagnosis not present

## 2024-03-27 DIAGNOSIS — D72829 Elevated white blood cell count, unspecified: Secondary | ICD-10-CM | POA: Diagnosis not present

## 2024-03-27 DIAGNOSIS — D46Z Other myelodysplastic syndromes: Secondary | ICD-10-CM | POA: Diagnosis not present

## 2024-03-27 DIAGNOSIS — C9110 Chronic lymphocytic leukemia of B-cell type not having achieved remission: Secondary | ICD-10-CM | POA: Diagnosis not present

## 2024-03-27 LAB — MAGNESIUM: Magnesium: 2.2 mg/dL (ref 1.7–2.4)

## 2024-03-27 LAB — COMPREHENSIVE METABOLIC PANEL WITH GFR
ALT: 33 U/L (ref 0–44)
AST: 96 U/L — ABNORMAL HIGH (ref 15–41)
Albumin: 3 g/dL — ABNORMAL LOW (ref 3.5–5.0)
Alkaline Phosphatase: 465 U/L — ABNORMAL HIGH (ref 38–126)
Anion gap: 17 — ABNORMAL HIGH (ref 5–15)
BUN: 47 mg/dL — ABNORMAL HIGH (ref 8–23)
CO2: 19 mmol/L — ABNORMAL LOW (ref 22–32)
Calcium: 8.5 mg/dL — ABNORMAL LOW (ref 8.9–10.3)
Chloride: 102 mmol/L (ref 98–111)
Creatinine, Ser: 2.74 mg/dL — ABNORMAL HIGH (ref 0.61–1.24)
GFR, Estimated: 23 mL/min — ABNORMAL LOW (ref >60)
Glucose, Bld: 92 mg/dL (ref 70–99)
Potassium: 4 mmol/L (ref 3.5–5.1)
Sodium: 139 mmol/L (ref 135–145)
Total Bilirubin: 4.3 mg/dL — ABNORMAL HIGH (ref 0.0–1.2)
Total Protein: 5.4 g/dL — ABNORMAL LOW (ref 6.5–8.1)

## 2024-03-27 LAB — CBC
HCT: 29.8 % — ABNORMAL LOW (ref 39.0–52.0)
Hemoglobin: 9.2 g/dL — ABNORMAL LOW (ref 13.0–17.0)
MCH: 30 pg (ref 26.0–34.0)
MCHC: 30.9 g/dL (ref 30.0–36.0)
MCV: 97.1 fL (ref 80.0–100.0)
Platelets: 108 K/uL — ABNORMAL LOW (ref 150–400)
RBC: 3.07 MIL/uL — ABNORMAL LOW (ref 4.22–5.81)
RDW: 30.6 % — ABNORMAL HIGH (ref 11.5–15.5)
WBC: 49.8 K/uL — ABNORMAL HIGH (ref 4.0–10.5)
nRBC: 6.4 % — ABNORMAL HIGH (ref 0.0–0.2)

## 2024-03-27 LAB — LACTATE DEHYDROGENASE: LDH: 989 U/L — ABNORMAL HIGH (ref 105–235)

## 2024-03-27 LAB — ALBUMIN: Albumin: 2.9 g/dL — ABNORMAL LOW (ref 3.5–5.0)

## 2024-03-27 LAB — PROCALCITONIN: Procalcitonin: 3.13 ng/mL

## 2024-03-27 LAB — SURGICAL PATHOLOGY

## 2024-03-27 MED ORDER — HEPARIN BOLUS VIA INFUSION
2000.0000 [IU] | Freq: Once | INTRAVENOUS | Status: AC
  Administered 2024-03-27: 2000 [IU] via INTRAVENOUS
  Filled 2024-03-27: qty 2000

## 2024-03-27 MED ORDER — TAMSULOSIN HCL 0.4 MG PO CAPS
0.4000 mg | ORAL_CAPSULE | Freq: Every day | ORAL | Status: DC
  Administered 2024-03-27 – 2024-03-30 (×4): 0.4 mg via ORAL
  Filled 2024-03-27 (×4): qty 1

## 2024-03-27 MED ORDER — TECHNETIUM TO 99M ALBUMIN AGGREGATED
4.0000 | Freq: Once | INTRAVENOUS | Status: AC
  Administered 2024-03-27: 4.18 via INTRAVENOUS

## 2024-03-27 MED ORDER — BOOST / RESOURCE BREEZE PO LIQD CUSTOM
1.0000 | Freq: Three times a day (TID) | ORAL | Status: DC
  Administered 2024-03-28 – 2024-03-30 (×7): 1 via ORAL

## 2024-03-27 MED ORDER — PIPERACILLIN-TAZOBACTAM 3.375 G IVPB
3.3750 g | Freq: Three times a day (TID) | INTRAVENOUS | Status: DC
  Administered 2024-03-27: 3.375 g via INTRAVENOUS
  Filled 2024-03-27: qty 50

## 2024-03-27 MED ORDER — BRIMONIDINE TARTRATE 0.2 % OP SOLN
1.0000 [drp] | Freq: Two times a day (BID) | OPHTHALMIC | Status: DC
  Administered 2024-03-27 – 2024-03-30 (×8): 1 [drp] via OPHTHALMIC
  Filled 2024-03-27: qty 5

## 2024-03-27 MED ORDER — DORZOLAMIDE HCL-TIMOLOL MAL 2-0.5 % OP SOLN
1.0000 [drp] | Freq: Two times a day (BID) | OPHTHALMIC | Status: DC
  Administered 2024-03-28 – 2024-03-30 (×6): 1 [drp] via OPHTHALMIC
  Filled 2024-03-27: qty 10

## 2024-03-27 MED ORDER — LIDOCAINE-EPINEPHRINE (PF) 2 %-1:200000 IJ SOLN
INTRAMUSCULAR | Status: AC
  Filled 2024-03-27: qty 20

## 2024-03-27 MED ORDER — HEPARIN (PORCINE) 25000 UT/250ML-% IV SOLN
2150.0000 [IU]/h | INTRAVENOUS | Status: DC
  Administered 2024-03-27: 1300 [IU]/h via INTRAVENOUS
  Administered 2024-03-28: 1750 [IU]/h via INTRAVENOUS
  Administered 2024-03-28: 1550 [IU]/h via INTRAVENOUS
  Administered 2024-03-29: 1750 [IU]/h via INTRAVENOUS
  Administered 2024-03-29 – 2024-03-31 (×4): 2150 [IU]/h via INTRAVENOUS
  Filled 2024-03-27 (×7): qty 250

## 2024-03-27 NOTE — Progress Notes (Addendum)
 Jared [name]   DOB:[birth year]   FM#:[id number]      ASSESSMENT & PLAN:  Jared [name] is an 78 year old male patient with oncologic history significant for MDS-CLL.  Patient admitted 03/26/2024 from oncology clinic with complaints of abdominal pain.  Medical oncology following.  MDS - Recent diagnosis 2025.   - Flow cytometry done 03/19/2024 showed small residual B-cell, no leukemia and no increase in circulating blasts.  Pending repeat flow drawn 03/26/2024. - Has been on weekly ESA/Retacrit, not given on 03/26/2024. - Medical oncology/Dr. Loretha following.  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  Observation only. - WBC 49.8 today.  WBC has actually decreased slightly from admission. - Status post IV Vanco x 1.  Recommend consideration for empiric IV antibiotic therapy. - Continue infectious workup. - Afebrile, monitor fever curve - Continue to monitor CBC with differential.  Abdominal pain Abdominal distention Generalized weakness - Continue pain medications as ordered -Continue supportive care  Anemia Thrombocytopenia - Hemoglobin 9.2 - No transfusional intervention required at this time.  Recommend PRBC transfusion for hemoglobin <7.0. - Platelets 108K.  Mild thrombocytopenia.  No intervention required at this time.   Code Status Full  Subjective:  Patient seen awake and alert laying in bed.  Appears somewhat lethargic although patient's wife at bedside states he just received pain medication.  Patient does admit to generalized abdominal pain all over.  Patient's wife reports being concerned about how weak he has become.  Denies fever or chills or shortness of breath. No acute distress is noted.  Objective:   Intake/Output Summary (Last 24 hours) at 03/27/2024 1141 Last data filed at 03/27/2024 0400 Gross per 24 hour  Intake 881.78 ml  Output 525 ml  Net 356.78 ml     PHYSICAL EXAMINATION: ECOG  PERFORMANCE STATUS: 3 - Symptomatic, >50% confined to bed  Vitals:   03/27/24 0028 03/27/24 0436  BP: (!) 100/59 105/62  Pulse: 97 95  Resp: 20 17  Temp: 98.4 F (36.9 C) 99.1 F (37.3 C)  SpO2: 95% 95%   Filed Weights   03/27/24 0424  Weight: 170 lb 10.2 oz (77.4 kg)    GENERAL: + Lethargic intermittently + chronically ill-appearing  SKIN: skin color, texture, turgor are normal, no rashes or significant lesions EYES: normal, conjunctiva are pink and non-injected, sclera clear OROPHARYNX: no exudate, no erythema and lips, buccal mucosa, and tongue normal  NECK: supple, thyroid  normal size, non-tender, without nodularity LYMPH: no palpable lymphadenopathy in the cervical, axillary or inguinal LUNGS: clear to auscultation and percussion with normal breathing effort HEART: regular rate & rhythm and no murmurs and no lower extremity edema ABDOMEN: + Abdominal distention and tenderness MUSCULOSKELETAL: no cyanosis of digits and no clubbing  PSYCH: + Lethargic but oriented x 3 with fluent speech NEURO: no focal motor/sensory deficits   All questions were answered. The patient knows to call the clinic with any problems, questions or concerns.   The total time spent in the appointment was 40 minutes encounter with patient including review of chart and various tests results, discussions about plan of care and coordination of care plan  [name], NP 03/27/2024 11:41 AM    Labs Reviewed:  Lab Results  Component Value Date   WBC 49.8 (H) 03/27/2024   HGB 9.2 (L) 03/27/2024   HCT 29.8 (L) 03/27/2024   MCV 97.1 03/27/2024   PLT 108 (L) 03/27/2024   Recent Labs    03/26/24 0909  03/26/24 1044 03/26/24 1047 03/27/24 0409  NA 139 138 136 139  K 3.9 4.3 4.1 4.0  CL 102 100 103 102  CO2 22 21*  --  19*  GLUCOSE 82 87 90 92  BUN 51* 52* 49* 47*  CREATININE 2.68* 2.84* 3.10* 2.74*  CALCIUM 8.2* 8.7*  --  8.5*  GFRNONAA 23* 22*  --  23*  PROT 5.2* 5.9*  --  5.4*  ALBUMIN  3.0* 3.3*  --  3.0*  AST 79* 108*  --  96*  ALT 32 42  --  33  ALKPHOS 397* 516*  --  465*  BILITOT 5.5* 4.9*  --  4.3*  BILIDIR  --  3.5*  --   --   IBILI  --  1.4*  --   --     Studies Reviewed:  US  Abdomen Limited RUQ (LIVER/GB) Result Date: 03/26/2024 CLINICAL DATA:  Abdominal pain. EXAM: ULTRASOUND ABDOMEN LIMITED RIGHT UPPER QUADRANT COMPARISON:  CT dated 03/26/2024. FINDINGS: Gallbladder: There is stone and layering sludge within the gallbladder. The gallbladder wall is mildly thickened measuring 5 mm in thickness, possibly related to ascites. Negative sonographic Murphy's sign. Common bile duct: Diameter: 8 mm Liver: The liver demonstrates a normal echogenicity. There is apparent minimal irregularity of the liver contour which may represent changes of cirrhosis. Clinical correlation recommended. Portal vein is patent on color Doppler imaging with normal direction of blood flow towards the liver. Other: Moderate ascites noted. IMPRESSION: 1. Gallbladder stone and sludge. No sonographic evidence of acute cholecystitis. 2. Moderate ascites. 3. Questionable cirrhosis.  Clinical correlation is recommended. Electronically Signed   By: Vanetta Chou M.D.   On: 03/26/2024 14:03   CT CHEST ABDOMEN PELVIS WO CONTRAST Result Date: 03/26/2024 EXAM: CT CHEST, ABDOMEN AND PELVIS WITHOUT CONTRAST 03/26/2024 11:41:11 AM TECHNIQUE: CT of the chest, abdomen and pelvis was performed without the administration of intravenous contrast. Multiplanar reformatted images are provided for review. Automated exposure control, iterative reconstruction, and/or weight based adjustment of the mA/kV was utilized to reduce the radiation dose to as low as reasonably achievable. COMPARISON: CT of the abdomen and pelvis dated 03/01/2024. CLINICAL HISTORY: History of hemorrhagic gastritis. Distended abdomen. Likely ascites. Increased bilirubin level. FINDINGS: CHEST: MEDIASTINUM AND LYMPH NODES: There is moderate calcific  coronary artery disease present. Pericardium is unremarkable. The central airways are clear. No mediastinal, hilar or axillary lymphadenopathy. There is moderate calcific atheromatous disease within the thoracic aorta. The esophagus is unremarkable. LUNGS AND PLEURA: There is a small patchy, reticular area of opacification/consolidation present anteriorly within the right upper lobe underlying the right first costosternal joint. There are patchy, streaky bronchovascular opacities present within the lung bases bilaterally. There is improved aeration of the lower lobes relative to the previous study. The left-sided pleural effusion has mildly worsened in the interim. No pneumothorax. ABDOMEN AND PELVIS: LIVER: The liver is unremarkable. GALLBLADDER AND BILE DUCTS: There is radiopaque material laying independently within the gallbladder. No biliary ductal dilatation. SPLEEN: The spleen is enlarged. PANCREAS: No acute abnormality. ADRENAL GLANDS: No acute abnormality. KIDNEYS, URETERS AND BLADDER: No stones in the kidneys or ureters. No hydronephrosis. No perinephric or periureteral stranding. Urinary bladder is unremarkable. GI AND BOWEL: Stomach demonstrates no acute abnormality. There is no bowel obstruction. REPRODUCTIVE ORGANS: No acute abnormality. PERITONEUM AND RETROPERITONEUM: There is moderate ascites present. No free air. VASCULATURE: Aorta is normal in caliber. ABDOMINAL AND PELVIS LYMPH NODES: No lymphadenopathy. BONES AND SOFT TISSUES: The patient is status post left total  hip arthroplasty. There are advanced degenerative changes within the lower lumbar spine. The patient is status post decompression laminectomies. No acute osseous abnormality. No focal soft tissue abnormality. IMPRESSION: 1. Small patchy reticular opacification/consolidation in the right upper lobe and patchy streaky bronchovascular opacities at the lung bases bilaterally; aeration of the lower lobes has improved compared to 03/01/24 2.  Mildly worsened left pleural effusion 3. Moderate ascites 4. Splenomegaly 5. Radiopaque material within the gallbladder, compatible with gallstones 6. Moderate calcific coronary artery disease and moderate calcific atheromatous disease of the thoracic aorta Electronically signed by: Evalene Coho MD 03/26/2024 12:18 PM EST RP Workstation: [id number]   DG Chest Port 1 View Result Date: 03/26/2024 CLINICAL DATA:  Possible sepsis. EXAM: PORTABLE CHEST 1 VIEW COMPARISON:  09/20/2017 FINDINGS: Lungs are hypoinflated with bibasilar opacification left worse than right likely small layering left effusion with bibasilar atelectasis. Infection in the lung bases is possible. Cardiomediastinal silhouette and remainder of the exam is unchanged. IMPRESSION: Hypoinflation with bibasilar opacification left worse than right likely small layering left effusion with bibasilar atelectasis. Infection in the lung bases is possible. Electronically Signed   By: Toribio Agreste M.D.   On: 03/26/2024 11:42   DG Abd 1 View Result Date: 03/04/2024 CLINICAL DATA:  Abdominal distension. EXAM: ABDOMEN - 1 VIEW COMPARISON:  Radiograph dated 02/27/2024. FINDINGS: No bowel dilatation or evidence of obstruction. Air and contrast noted in the colon. Degenerative changes spine. Total left hip arthroplasty. No acute osseous pathology. IMPRESSION: Nonobstructive bowel gas pattern. Electronically Signed   By: Vanetta Chou M.D.   On: 03/04/2024 18:35   US  RENAL Result Date: 03/03/2024 CLINICAL DATA:  Renal failure. EXAM: RENAL / URINARY TRACT ULTRASOUND COMPLETE COMPARISON:  None Available. FINDINGS: Right Kidney: Renal measurements: 10.0 cm x 5.3 cm x 5.2 cm = volume: 142.6 mL. There is diffusely increased echogenicity of the renal parenchyma. A 2.3 cm x 2.1 cm x 1.6 cm simple right renal cyst is seen within the mid right kidney. No hydronephrosis is visualized. Left Kidney: Renal measurements: 10.3 cm x 4.6 cm x 5.1 cm = volume: 125.1  mL. There is diffusely increased echogenicity of the renal parenchyma. A 1.9 cm x 1.7 cm x 1.6 cm simple left renal cyst is seen with the mid to lower left kidney. No hydronephrosis is visualized. Bladder: Appears normal for degree of bladder distention. The bilateral ureteral jets are not visualized peer Other: There is a moderate amount of ascites. IMPRESSION: 1. Bilateral echogenic kidneys which likely represents sequelae associated with medical renal disease. 2. Bilateral simple renal cysts. 3. Ascites. Electronically Signed   By: Suzen Dials M.D.   On: 03/03/2024 18:03   CT ABDOMEN PELVIS WO CONTRAST Result Date: 03/01/2024 CLINICAL DATA:  Abdominal distension. EXAM: CT ABDOMEN AND PELVIS WITHOUT CONTRAST TECHNIQUE: Multidetector CT imaging of the abdomen and pelvis was performed following the standard protocol without IV contrast. RADIATION DOSE REDUCTION: This exam was performed according to the departmental dose-optimization program which includes automated exposure control, adjustment of the mA and/or kV according to patient size and/or use of iterative reconstruction technique. COMPARISON:  CT abdomen pelvis dated 02/24/2024. FINDINGS: Evaluation of this exam is limited in the absence of intravenous contrast as well as due to respiratory motion. Evaluation is also limited due to anasarca and streak artifact caused by left hip arthroplasty. Lower chest: Small bilateral pleural effusions. Large areas of consolidative changes involving the lower lobes bilaterally may represent atelectasis or pneumonia and progressed since the prior CT.  No intra-abdominal free air. Diffuse mesenteric edema and small ascites. Hepatobiliary: The liver is grossly unremarkable. No biliary dilatation. Gallstone. Vicarious excretion of contrast also noted in the gallbladder. Pancreas: Unremarkable. No pancreatic ductal dilatation or surrounding inflammatory changes. Spleen: Normal in size without focal abnormality.  Adrenals/Urinary Tract: The adrenal glands unremarkable. There is no hydronephrosis or nephrolithiasis on either side. Bilateral perinephric stranding, nonspecific. The urinary bladder is suboptimally evaluated due to streak artifact caused by left hip arthroplasty. Stomach/Bowel: Oral contrast noted throughout the bowel and colon. There is sigmoid diverticulosis. No bowel obstruction. Normal appendix. Vascular/Lymphatic: Moderate aortoiliac atherosclerotic disease. The IVC is grossly unremarkable. No portal venous gas. No obvious adenopathy. Reproductive: The prostate gland is poorly visualized. Other: Diffuse mesenteric and subcutaneous edema and anasarca, worsened since the prior CT. Musculoskeletal: Osteopenia with degenerative changes. Total left hip arthroplasty. No acute osseous pathology. IMPRESSION: 1. No bowel obstruction. Normal appendix. 2. Sigmoid diverticulosis. 3. Cholelithiasis. 4. Small bilateral pleural effusions with large areas of consolidative changes involving the lower lobes bilaterally may represent atelectasis or pneumonia and progressed since the prior CT. 5. Anasarca, worsened since the prior CT. 6.  Aortic Atherosclerosis (ICD10-I70.0). Electronically Signed   By: Vanetta Chou M.D.   On: 03/01/2024 15:51   US  Paracentesis Result Date: 02/27/2024 INDICATION: Therapeutic and diagnostic removal of ascites fluid. EXAM: ULTRASOUND GUIDED right-sided PARACENTESIS MEDICATIONS: None. COMPLICATIONS: None immediate. PROCEDURE: Informed written consent was obtained from the patient after a discussion of the risks, benefits and alternatives to treatment. A timeout was performed prior to the initiation of the procedure. Initial ultrasound scanning demonstrates a moderate amount of ascites within the right lower abdominal quadrant. The right lower abdomen was prepped and draped in the usual sterile fashion. 1% lidocaine  was used for local anesthesia. Following this, a 19 gauge 7 cm Yueh  catheter was introduced. An ultrasound image was saved for documentation purposes. The paracentesis was performed. The catheter was removed and a dressing was applied. The patient tolerated the procedure well without immediate post procedural complication. FINDINGS: A total of approximately 1.2 of sanguinous fluid was removed. Samples were sent to the laboratory as requested by the clinical team. IMPRESSION: Successful ultrasound-guided paracentesis yielding 1.2 liters of peritoneal fluid. Electronically Signed   By: Cordella Banner   On: 02/27/2024 16:33   DG Abd Portable 1V Result Date: 02/27/2024 CLINICAL DATA:  13914 Abdominal distention 13914 EXAM: PORTABLE ABDOMEN - 1 VIEW COMPARISON:  02/25/2024 FINDINGS: Nonobstructive bowel gas pattern.Enteric contrast noted in the decompressed colon.No pneumoperitoneum. No organomegaly or radiopaque calculi. No acute fracture or destructive lesion. 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 IMPRESSION: Nonobstructive bowel gas pattern. Electronically Signed   By: Rogelia Myers M.D.   On: 02/27/2024 08:53     Attending Note  I personally saw the patient, reviewed the chart and examined the patient. He appears worse today than yesterday. He is more fatigued, complains of ongoing abdominal pain, was a bit sleepy, he got some pain medication. He was on heparin  for BLE DVT. No acute PE Discussed flow results with Dr Frutoso, no sig increase in blasts, she reported left shift suggestive of bone marrow distress.  I discussed these results with pt and family I am worried about possible underlying SBP since he started complaining of abdominal pain, and I wonder if this is leukemoid reaction I discussed with primary team about considering broad spectrum abx coverage until infection can be ruled out There appears to be some  improvement in leukocytosis, I believe he received one dose of vanco . There is mild  degree of hemolysis I believe but most of the bili is direct bilirubin, No overt evidence of DIC, fibrinogen is normal. D dimer likely elevated from acute process as well as bilateral DVT> Family has questions about the timing of paracentesis, unfortunately I dont have this information. I told them to discuss with AM nurse and primary team. Consider transfusion to maintain a Hb of 8 and plt count of 20 K in absence of bleeding. Please call on call hematology if any new questions arise over the weekend.

## 2024-03-27 NOTE — Progress Notes (Signed)
 PROGRESS NOTE    Jared [name]  FMW:[id number] DOB: 10/05/1945 DOA: 03/26/2024 PCP: [name], FNP   Brief Narrative:  Jared [name] is a 78 y.o. 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   Rule out concurrent infection - Patient admitted with markedly elevated leukocyte count in the 50s from baseline of around 25 - No acute complaints at this time other than mild epigastric pain, not related to p.o. intake palpation or movement/positioning - Ascites Gram stain and culture pending, antibiotics on hold to ensure appropriate and accurate cultures can be taken -he does not meet sepsis criteria - Procalcitonin elevated at 3, continue to follow for source - If notable symptoms or worsening leukocytosis over the next 24 hours we will initiate broad-spectrum antibiotics - Heme-onc following, appreciate insight recommendations - Cultures pending  Elevated D-dimer with positive DVT study Rule out PE - Unable to perform CTA due to renal function, VQ scan pending - Initiate heparin  drip, follow clinically - DVT study lower extremity positive - May be etiology of his atypical epigastric pain  Essential hypertension: Chronic diastolic heart failure: Fluids on hold - continue PO PTA Imdur , lisinopril , Farxiga.   Ascites: Ultrasound-guided paracentesis with no growth on peritoneal culture. Patient will need IR paracentesis in AM.  Will defer to a.m. team for ordering and following up.  Currently his abdomen is soft not tense.   AKI on CKD stage IIIb: Continue to follow - near baseline after supportive  care/gentle fluids overnight   History of gastritis: IV PPI. Advance diet as tolerated   Chronic anemia chronic disease, recent blood loss anemia Hospitalized last month with hemorrhagic gastritis with no evidence of ulcer . Hemoglobin today is stable -continue to follow  DVT prophylaxis: SCDs Start: 03/26/24 1344 Code Status:   Code Status: Full Code Family Communication: Wife updated   Status is: Inpatient  Dispo: The patient is from: Home              Anticipated d/c is to: Home              Anticipated d/c date is: 48 to 72 hours              Patient currently not medically stable for discharge  Consultants:  Oncology  Procedures:  None  Antimicrobials:  Currently on hold, plan for broad-spectrum antibiotics if worsening fever symptoms or source identified  Subjective: No acute issues or events overnight, generally improving, feels quite well comparatively, mild epigastric pain noted not related to meals position or movement  Objective: Vitals:   03/26/24 2003 03/27/24 0028 03/27/24 0424 03/27/24 0436  BP: 118/63 (!) 100/59  105/62  Pulse: 97 97  95  Resp: 18 20  17   Temp: 98.3 F (36.8 C) 98.4 F (36.9 C)  99.1 F (37.3 C)  TempSrc: Oral Oral  Oral  SpO2: 94% 95%  95%  Weight:   77.4 kg   Height:   5' 11 (1.803 m)     Intake/Output Summary (Last 24 hours) at 03/27/2024 0828 Last data filed at 03/27/2024 0400 Gross per 24 hour  Intake 881.78 ml  Output 525  ml  Net 356.78 ml   Filed Weights   03/27/24 0424  Weight: 77.4 kg    Examination:  General:  Pleasantly resting in bed, No acute distress. HEENT:  Normocephalic atraumatic.  Sclerae nonicteric, noninjected.  Extraocular movements intact bilaterally. Neck:  Without mass or deformity.  Trachea is midline. Lungs:  Clear to auscultate bilaterally without rhonchi, wheeze, or rales. Heart:  Regular rate and rhythm.  Without murmurs, rubs, or gallops. Abdomen:  Soft, nontender, nondistended.   Without guarding or rebound. Extremities: Without cyanosis, clubbing, edema, or obvious deformity. Skin:  Warm and dry, no erythema.   Data Reviewed: I have personally reviewed following labs and imaging studies  CBC: Recent Labs  Lab 03/26/24 0820 03/26/24 1044 03/26/24 1047 03/26/24 2156 03/27/24 0409  WBC 55.3* 54.5*  --   --  49.8*  NEUTROABS 49.8* 51.2*  --   --   --   HGB 9.4* 9.6* 11.9* 9.0* 9.2*  HCT 29.4* 31.4* 35.0* 28.7* 29.8*  MCV 92.7 97.8  --   --  97.1  PLT 113* 115*  --   --  108*   Basic Metabolic Panel: Recent Labs  Lab 03/26/24 0909 03/26/24 1044 03/26/24 1047 03/26/24 1103 03/27/24 0409  NA 139 138 136  --  139  K 3.9 4.3 4.1  --  4.0  CL 102 100 103  --  102  CO2 22 21*  --   --  19*  GLUCOSE 82 87 90  --  92  BUN 51* 52* 49*  --  47*  CREATININE 2.68* 2.84* 3.10*  --  2.74*  CALCIUM 8.2* 8.7*  --   --  8.5*  MG  --   --   --   --  2.2  PHOS  --   --   --  4.3  --    GFR: Estimated Creatinine Clearance: 22.9 mL/min (A) (by C-G formula based on SCr of 2.74 mg/dL (H)). Liver Function Tests: Recent Labs  Lab 03/26/24 0909 03/26/24 1044 03/27/24 0409  AST 79* 108* 96*  ALT 32 42 33  ALKPHOS 397* 516* 465*  BILITOT 5.5* 4.9* 4.3*  PROT 5.2* 5.9* 5.4*  ALBUMIN 3.0* 3.3* 3.0*   Recent Labs  Lab 03/26/24 1044  LIPASE 77*   Recent Labs  Lab 03/26/24 1044  AMMONIA 25   Coagulation Profile: Recent Labs  Lab 03/26/24 1044  INR 1.2   Cardiac Enzymes: No results for input(s): CKTOTAL, CKMB, CKMBINDEX, TROPONINI in the last 168 hours. BNP (last 3 results) Recent Labs    04/17/23 1042  PROBNP 635*   HbA1C: No results for input(s): HGBA1C in the last 72 hours. CBG: No results for input(s): GLUCAP in the last 168 hours. Lipid Profile: No results for input(s): CHOL, HDL, LDLCALC, TRIG, CHOLHDL, LDLDIRECT in the last 72 hours. Thyroid  Function Tests: Recent Labs    03/26/24 0820  TSH 17.700*   Anemia  Panel: Recent Labs    03/26/24 1456  VITAMINB12 >4,000*  FOLATE 13.2  FERRITIN 2,842*  TIBC 221*  IRON 46  RETICCTPCT 6.8*   Sepsis Labs: Recent Labs  Lab 03/26/24 1046 03/26/24 1351  LATICACIDVEN 1.3 1.4    Recent Results (from the past 240 hours)  Blood Culture (routine x 2)     Status: None (Preliminary result)   Collection Time: 03/26/24 10:44 AM   Specimen: BLOOD  Result Value Ref Range Status   Specimen Description   Final    BLOOD BLOOD RIGHT ARM  Performed at [facility], [address], [place], [place] [address]    Special Requests   Final    BOTTLES DRAWN AEROBIC AND ANAEROBIC Blood Culture adequate volume Performed at [facility], [address], [place], [place] [address]    Culture   Final    NO GROWTH < 24 HOURS Performed at [facility], [address], [place], [place] [address]    Report Status PENDING  Incomplete  Blood Culture (routine x 2)     Status: None (Preliminary result)   Collection Time: 03/26/24 10:58 AM   Specimen: BLOOD  Result Value Ref Range Status   Specimen Description   Final    BLOOD BLOOD LEFT ARM Performed at [facility], [address], [place], [place] [address]    Special Requests   Final    BOTTLES DRAWN AEROBIC AND ANAEROBIC Blood Culture adequate volume Performed at [facility], [address] [place], [place] [address]    Culture   Final    NO GROWTH < 24 HOURS Performed at [facility], [address] [place], [place] [address]    Report Status PENDING  Incomplete         Radiology Studies: US  Abdomen Limited RUQ (LIVER/GB) Result Date: 03/26/2024 CLINICAL DATA:  Abdominal pain. EXAM: ULTRASOUND ABDOMEN LIMITED RIGHT UPPER QUADRANT COMPARISON:  CT dated 03/26/2024. FINDINGS: Gallbladder: There is stone and layering sludge within the gallbladder. The gallbladder wall is mildly thickened measuring 5 mm in thickness,  possibly related to ascites. Negative sonographic Murphy's sign. Common bile duct: Diameter: 8 mm Liver: The liver demonstrates a normal echogenicity. There is apparent minimal irregularity of the liver contour which may represent changes of cirrhosis. Clinical correlation recommended. Portal vein is patent on color Doppler imaging with normal direction of blood flow towards the liver. Other: Moderate ascites noted. IMPRESSION: 1. Gallbladder stone and sludge. No sonographic evidence of acute cholecystitis. 2. Moderate ascites. 3. Questionable cirrhosis.  Clinical correlation is recommended. Electronically Signed   By: Vanetta Chou M.D.   On: 03/26/2024 14:03   CT CHEST ABDOMEN PELVIS WO CONTRAST Result Date: 03/26/2024 EXAM: CT CHEST, ABDOMEN AND PELVIS WITHOUT CONTRAST 03/26/2024 11:41:11 AM TECHNIQUE: CT of the chest, abdomen and pelvis was performed without the administration of intravenous contrast. Multiplanar reformatted images are provided for review. Automated exposure control, iterative reconstruction, and/or weight based adjustment of the mA/kV was utilized to reduce the radiation dose to as low as reasonably achievable. COMPARISON: CT of the abdomen and pelvis dated 03/01/2024. CLINICAL HISTORY: History of hemorrhagic gastritis. Distended abdomen. Likely ascites. Increased bilirubin level. FINDINGS: CHEST: MEDIASTINUM AND LYMPH NODES: There is moderate calcific coronary artery disease present. Pericardium is unremarkable. The central airways are clear. No mediastinal, hilar or axillary lymphadenopathy. There is moderate calcific atheromatous disease within the thoracic aorta. The esophagus is unremarkable. LUNGS AND PLEURA: There is a small patchy, reticular area of opacification/consolidation present anteriorly within the right upper lobe underlying the right first costosternal joint. There are patchy, streaky bronchovascular opacities present within the lung bases bilaterally. There is improved  aeration of the lower lobes relative to the previous study. The left-sided pleural effusion has mildly worsened in the interim. No pneumothorax. ABDOMEN AND PELVIS: LIVER: The liver is unremarkable. GALLBLADDER AND BILE DUCTS: There is radiopaque material laying independently within the gallbladder. No biliary ductal dilatation. SPLEEN: The spleen is enlarged. PANCREAS: No acute abnormality. ADRENAL GLANDS: No acute abnormality.  KIDNEYS, URETERS AND BLADDER: No stones in the kidneys or ureters. No hydronephrosis. No perinephric or periureteral stranding. Urinary bladder is unremarkable. GI AND BOWEL: Stomach demonstrates no acute abnormality. There is no bowel obstruction. REPRODUCTIVE ORGANS: No acute abnormality. PERITONEUM AND RETROPERITONEUM: There is moderate ascites present. No free air. VASCULATURE: Aorta is normal in caliber. ABDOMINAL AND PELVIS LYMPH NODES: No lymphadenopathy. BONES AND SOFT TISSUES: The patient is status post left total hip arthroplasty. There are advanced degenerative changes within the lower lumbar spine. The patient is status post decompression laminectomies. No acute osseous abnormality. No focal soft tissue abnormality. IMPRESSION: 1. Small patchy reticular opacification/consolidation in the right upper lobe and patchy streaky bronchovascular opacities at the lung bases bilaterally; aeration of the lower lobes has improved compared to 03/01/24 2. Mildly worsened left pleural effusion 3. Moderate ascites 4. Splenomegaly 5. Radiopaque material within the gallbladder, compatible with gallstones 6. Moderate calcific coronary artery disease and moderate calcific atheromatous disease of the thoracic aorta Electronically signed by: Evalene Coho MD 03/26/2024 12:18 PM EST RP Workstation: [id number]   DG Chest Port 1 View Result Date: 03/26/2024 CLINICAL DATA:  Possible sepsis. EXAM: PORTABLE CHEST 1 VIEW COMPARISON:  09/20/2017 FINDINGS: Lungs are hypoinflated with bibasilar  opacification left worse than right likely small layering left effusion with bibasilar atelectasis. Infection in the lung bases is possible. Cardiomediastinal silhouette and remainder of the exam is unchanged. IMPRESSION: Hypoinflation with bibasilar opacification left worse than right likely small layering left effusion with bibasilar atelectasis. Infection in the lung bases is possible. Electronically Signed   By: Toribio Agreste M.D.   On: 03/26/2024 11:42        Scheduled Meds:  Chlorhexidine  Gluconate Cloth  6 each Topical Daily   latanoprost   1 drop Left Eye QHS   pantoprazole  (PROTONIX ) IV  40 mg Intravenous Q24H   pneumococcal 20-valent conjugate vaccine  0.5 mL Intramuscular Tomorrow-1000   sodium chloride  flush  3 mL Intravenous Q12H   vancomycin variable dose per unstable renal function (pharmacist dosing)   Does not apply See admin instructions   Continuous Infusions:  sodium chloride  50 mL/hr at 03/26/24 1852   piperacillin-tazobactam (ZOSYN)  IV 3.375 g (03/26/24 2340)     LOS: 1 day   Time spent:  Jared JAYSON Montclair, DO [facility]  If 7PM-7AM, please contact night-coverage [email or web address]  03/27/2024, 8:28 AM

## 2024-03-27 NOTE — Evaluation (Signed)
 Physical Therapy Evaluation Patient Details Name: Jared [name] MRN: [id number] DOB: 26-Jan-1946 Today's Date: 03/27/2024  History of Present Illness  Patient is an 78 y/o male admitted 03/26/24 from heme-onc clinic with elevated white count and neutrophils with concern for infection.  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  Positive for dehydration, ascites and plan for paracentesis.  PMH positive for HTN, CKD 3, CHF, h/o tobacco use, arthritis with prior orthopedic surgeries.  Clinical Impression  Patient presents with decreased mobility due to generalized weakness, decreased balance, decreased ROM R LE, decreased activity tolerance and decreased safety awareness.  Patient previously getting help from his wife at home for most ADL's and recently for ambulation and had HHPT set up.  Currently min to mod A for balance with sit to stand and for limited hallway ambulation with RW.  Patient with drop in SpO2 with ambulation to 80% (did not take rest), so applied O2 2.5LPM and with cues up to 90% after about 2-3 minutes.  Patient will benefit from continued skilled PT in the acute setting and to resume HHPT at d/c.  Could consider palliative consult.         If plan is discharge home, recommend the following: A little help with walking and/or transfers;A little help with bathing/dressing/bathroom;Help with stairs or ramp for entrance;Assist for transportation;Assistance with cooking/housework   Can travel by private vehicle        Equipment Recommendations None recommended by PT  Recommendations for Other Services       Functional Status Assessment Patient has had a recent decline in their functional status and/or demonstrates limited ability to make significant improvements in function in a reasonable and predictable amount of time     Precautions / Restrictions Precautions Precautions: Fall Recall of Precautions/Restrictions: Intact      Mobility  Bed Mobility Overal bed mobility: Needs  Assistance Bed Mobility: Supine to Sit, Sit to Supine     Supine to sit: Min assist, HOB elevated Sit to supine: Min assist, Mod assist   General bed mobility comments: up to EOB HHA and increased time to scoot EOB; to supine assist for positioning over donut for offloading sacral wound and repositioning legs and trunk    Transfers Overall transfer level: Needs assistance Equipment used: Rolling walker (2 wheels) Transfers: Sit to/from Stand Sit to Stand: From elevated surface, Mod assist, Min assist           General transfer comment: from EOB taller than normal height pt with some posterior bias min to mod A to correct, from recliner to get back in bed noted improved anterior weight shift and use of hands on armrests    Ambulation/Gait Ambulation/Gait assistance: Min assist Gait Distance (Feet): 30 Feet (chair follow) Assistive device: Rolling walker (2 wheels) Gait Pattern/deviations: Step-to pattern, Step-through pattern, Decreased stride length, Narrow base of support, Shuffle, Trunk flexed       General Gait Details: eager to walk further though faltering and with even shorter strides after short standing rest so encouraged to sit in recliner, noted SpO2 80% with pt on RA and RN made aware, started on 2-2.5L O2 with cues for pursed lip breathing, back to 90% after seated rest x 2-3 minutes  Stairs            Wheelchair Mobility     Tilt Bed    Modified Rankin (Stroke Patients Only)       Balance Overall balance assessment: Needs assistance   Sitting balance-Leahy Scale: Fair  Standing balance support: Bilateral upper extremity supported Standing balance-Leahy Scale: Poor                               Pertinent Vitals/Pain Pain Assessment Pain Assessment: Faces Faces Pain Scale: Hurts even more Pain Location: abdomen Pain Descriptors / Indicators: Discomfort Pain Intervention(s): Monitored during session    Home Living  Family/patient expects to be discharged to:: Private residence Living Arrangements: Spouse/significant other;Children Available Help at Discharge: Family Type of Home: House Home Access: Stairs to enter;Ramped entrance Entrance Stairs-Rails: Doctor, General Practice of Steps: 4   Home Layout: Laundry or work area in basement;Two level Home Equipment: Pharmacist, Hospital (2 wheels);Transport chair      Prior Function Prior Level of Function : Needs assist               ADLs Comments: wife helps with showering     Extremity/Trunk Assessment   Upper Extremity Assessment Upper Extremity Assessment: Defer to OT evaluation    Lower Extremity Assessment Lower Extremity Assessment: RLE deficits/detail;LLE deficits/detail RLE Deficits / Details: AAROM knee flexion to about 30 degrees, strength hip flexion 3-/5, knee extension 4-/5 LLE Deficits / Details: AROM WFL, strength hip flexion 3+/5, knee extension 4-/5    Cervical / Trunk Assessment Cervical / Trunk Assessment: Kyphotic;Other exceptions Cervical / Trunk Exceptions: abdominal distention  Communication   Communication Communication: No apparent difficulties    Cognition Arousal: Alert Behavior During Therapy: WFL for tasks assessed/performed   PT - Cognitive impairments: No apparent impairments                         Following commands: Intact       Cueing Cueing Techniques: Verbal cues     General Comments General comments (skin integrity, edema, etc.): son present and supportive, asking questions about pt's medical equipment and giving home info, etc    Exercises     Assessment/Plan    PT Assessment Patient needs continued PT services  PT Problem List Decreased strength;Decreased activity tolerance;Decreased balance;Decreased mobility;Decreased knowledge of use of DME;Decreased safety awareness       PT Treatment Interventions DME instruction;Gait training;Stair  training;Patient/family education;Functional mobility training;Therapeutic activities;Therapeutic exercise;Balance training    PT Goals (Current goals can be found in the Care Plan section)  Acute Rehab PT Goals Patient Stated Goal: return home PT Goal Formulation: With patient/family Time For Goal Achievement: 04/10/24 Potential to Achieve Goals: Fair    Frequency Min 2X/week     Co-evaluation               AM-PAC PT 6 Clicks Mobility  Outcome Measure Help needed turning from your back to your side while in a flat bed without using bedrails?: A Little Help needed moving from lying on your back to sitting on the side of a flat bed without using bedrails?: A Little Help needed moving to and from a bed to a chair (including a wheelchair)?: A Little Help needed standing up from a chair using your arms (e.g., wheelchair or bedside chair)?: A Lot Help needed to walk in hospital room?: A Little Help needed climbing 3-5 steps with a railing? : Total 6 Click Score: 15    End of Session Equipment Utilized During Treatment: Gait belt;Oxygen Activity Tolerance: Treatment limited secondary to medical complications (Comment) Patient left: in bed;with call bell/phone within reach;with family/visitor present   PT Visit Diagnosis: Other  abnormalities of gait and mobility (R26.89);Muscle weakness (generalized) (M62.81)    Time: 8584-8556 PT Time Calculation (min) (ACUTE ONLY): 28 min   Charges:   PT Evaluation $PT Eval Moderate Complexity: 1 Mod PT Treatments $Gait Training: 8-22 mins PT General Charges $$ ACUTE PT VISIT: 1 Visit         Micheline Portal, PT Acute Rehabilitation Services [phone] 03/27/2024   Montie Portal 03/27/2024, 4:13 PM

## 2024-03-27 NOTE — Progress Notes (Signed)
 VASCULAR LAB    Bilateral lower extremity venous duplex has been performed.  See CV proc for preliminary results.   Loralie Malta, RVT 03/27/2024, 3:51 PM

## 2024-03-27 NOTE — Consult Note (Signed)
 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  Apply silver hydrofiber ([place] [phone]) cut to fit wound bed daily and secure with silicone foam.    POC discussed with bedside nurse. WOC team will not follow. Re-consult if further needs arise.   Thank you,    Powell Bar MSN, RN-BC, [employer]

## 2024-03-27 NOTE — Progress Notes (Signed)
 Bladder scanned patient with volumes 574 mL, 538 mL, and 466 mL. Assessment complicated by known ascites, which may affect bladder scan accuracy. Patient is voiding appropriately at this time and denies urinary pressure or sensation of incomplete emptying.On call provider notified and consulted. Will continue to closely monitor pt's symptoms and urine output per provider direction.

## 2024-03-27 NOTE — Progress Notes (Signed)
 PHARMACY - ANTICOAGULATION CONSULT NOTE  Pharmacy Consult for IV heparin  Indication: DVT  No Known Allergies  Patient Measurements: Height: 5' 11 (180.3 cm) Weight: 77.4 kg (170 lb 10.2 oz) IBW/kg (Calculated) : 75.3 HEPARIN  DW (KG): 77.4  Vital Signs: Temp: 99.4 F (37.4 C) (11/14 1257) Temp Source: Oral (11/14 1257) BP: 105/54 (11/14 1257) Pulse Rate: 97 (11/14 1257)  Labs: Recent Labs    03/26/24 0820 03/26/24 0909 03/26/24 1044 03/26/24 1047 03/26/24 1456 03/26/24 2156 03/27/24 0409  HGB 9.4*  --  9.6* 11.9*  --  9.0* 9.2*  HCT 29.4*  --  31.4* 35.0*  --  28.7* 29.8*  PLT 113*  --  115*  --   --   --  108*  APTT  --   --   --   --  40*  --   --   LABPROT  --   --  15.5*  --   --   --   --   INR  --   --  1.2  --   --   --   --   CREATININE  --    < > 2.84* 3.10*  --   --  2.74*   < > = values in this interval not displayed.    Estimated Creatinine Clearance: 22.9 mL/min (A) (by C-G formula based on SCr of 2.74 mg/dL (H)).   Medical History: Past Medical History:  Diagnosis Date   Arthritis    knees and back   Enlarged prostate    takes Flomax    Headache    Hypertension    stress test done about 12 yrs ago - results wnl per pt.    Medications:  Scheduled:   brimonidine   1 drop Left Eye BID   Chlorhexidine  Gluconate Cloth  6 each Topical Daily   dorzolamide -timolol   1 drop Left Eye BID   feeding supplement  1 Container Oral TID BM   latanoprost   1 drop Left Eye QHS   pantoprazole  (PROTONIX ) IV  40 mg Intravenous Q24H   pneumococcal 20-valent conjugate vaccine  0.5 mL Intramuscular Tomorrow-1000   sodium chloride  flush  3 mL Intravenous Q12H   tamsulosin   0.4 mg Oral QHS   Infusions:  PRN: acetaminophen  **OR** acetaminophen , HYDROcodone -acetaminophen , morphine injection, ondansetron  **OR** ondansetron  (ZOFRAN ) IV  Assessment: Patient [facility] from [facility] for elevated WBC found to have new bilateral DVTs to start IV heparin . Plts 108 low. SCr  2.74 elevated.   Goal of Therapy:  Heparin  level 0.3-0.7 units/ml Monitor platelets by anticoagulation protocol: Yes   Plan:  IV heparin  2000 unit bolus then IV heparin  rate of 1300 units/hr Check heparin  level in 8 hours Daily CBC  Jared [name] 03/27/2024,5:32 PM

## 2024-03-28 ENCOUNTER — Inpatient Hospital Stay (HOSPITAL_COMMUNITY)

## 2024-03-28 DIAGNOSIS — N183 Chronic kidney disease, stage 3 unspecified: Secondary | ICD-10-CM | POA: Diagnosis not present

## 2024-03-28 DIAGNOSIS — R579 Shock, unspecified: Secondary | ICD-10-CM | POA: Diagnosis not present

## 2024-03-28 DIAGNOSIS — K659 Peritonitis, unspecified: Secondary | ICD-10-CM

## 2024-03-28 DIAGNOSIS — C9110 Chronic lymphocytic leukemia of B-cell type not having achieved remission: Secondary | ICD-10-CM | POA: Diagnosis not present

## 2024-03-28 DIAGNOSIS — R188 Other ascites: Secondary | ICD-10-CM

## 2024-03-28 DIAGNOSIS — N179 Acute kidney failure, unspecified: Secondary | ICD-10-CM | POA: Diagnosis not present

## 2024-03-28 DIAGNOSIS — D469 Myelodysplastic syndrome, unspecified: Secondary | ICD-10-CM | POA: Diagnosis not present

## 2024-03-28 DIAGNOSIS — E8721 Acute metabolic acidosis: Secondary | ICD-10-CM

## 2024-03-28 LAB — CBC WITH DIFFERENTIAL/PLATELET
Abs Immature Granulocytes: 4.05 K/uL — ABNORMAL HIGH (ref 0.00–0.07)
Abs Immature Granulocytes: 3.3 K/uL — ABNORMAL HIGH (ref 0.00–0.07)
Basophils Absolute: 1.5 K/uL — ABNORMAL HIGH (ref 0.0–0.1)
Basophils Absolute: 1.1 K/uL — ABNORMAL HIGH (ref 0.0–0.1)
Basophils Relative: 2 %
Basophils Relative: 2 %
Eosinophils Absolute: 0 K/uL (ref 0.0–0.5)
Eosinophils Absolute: 0 K/uL (ref 0.0–0.5)
Eosinophils Relative: 0 %
Eosinophils Relative: 0 %
HCT: 26 % — ABNORMAL LOW (ref 39.0–52.0)
HCT: 23.2 % — ABNORMAL LOW (ref 39.0–52.0)
Hemoglobin: 8 g/dL — ABNORMAL LOW (ref 13.0–17.0)
Hemoglobin: 7.1 g/dL — ABNORMAL LOW (ref 13.0–17.0)
Immature Granulocytes: 7 %
Immature Granulocytes: 6 %
Lymphocytes Relative: 4 %
Lymphocytes Relative: 3 %
Lymphs Abs: 2.4 K/uL (ref 0.7–4.0)
Lymphs Abs: 1.8 K/uL (ref 0.7–4.0)
MCH: 29.5 pg (ref 26.0–34.0)
MCH: 29.8 pg (ref 26.0–34.0)
MCHC: 30.8 g/dL (ref 30.0–36.0)
MCHC: 30.6 g/dL (ref 30.0–36.0)
MCV: 95.9 fL (ref 80.0–100.0)
MCV: 97.5 fL (ref 80.0–100.0)
Monocytes Absolute: 1.4 K/uL — ABNORMAL HIGH (ref 0.1–1.0)
Monocytes Absolute: 0.8 K/uL (ref 0.1–1.0)
Monocytes Relative: 2 %
Monocytes Relative: 2 %
Neutro Abs: 53.3 K/uL — ABNORMAL HIGH (ref 1.7–7.7)
Neutro Abs: 48.7 K/uL — ABNORMAL HIGH (ref 1.7–7.7)
Neutrophils Relative %: 85 %
Neutrophils Relative %: 87 %
Platelets: 79 K/uL — ABNORMAL LOW (ref 150–400)
Platelets: 66 K/uL — ABNORMAL LOW (ref 150–400)
RBC: 2.71 MIL/uL — ABNORMAL LOW (ref 4.22–5.81)
RBC: 2.38 MIL/uL — ABNORMAL LOW (ref 4.22–5.81)
RDW: 30.7 % — ABNORMAL HIGH (ref 11.5–15.5)
RDW: 30.1 % — ABNORMAL HIGH (ref 11.5–15.5)
WBC: 62.6 K/uL (ref 4.0–10.5)
WBC: 55.9 K/uL (ref 4.0–10.5)
nRBC: 4.9 % — ABNORMAL HIGH (ref 0.0–0.2)
nRBC: 5 % — ABNORMAL HIGH (ref 0.0–0.2)

## 2024-03-28 LAB — BASIC METABOLIC PANEL WITH GFR
Anion gap: 17 — ABNORMAL HIGH (ref 5–15)
BUN: 56 mg/dL — ABNORMAL HIGH (ref 8–23)
CO2: 20 mmol/L — ABNORMAL LOW (ref 22–32)
Calcium: 8.1 mg/dL — ABNORMAL LOW (ref 8.9–10.3)
Chloride: 103 mmol/L (ref 98–111)
Creatinine, Ser: 3.49 mg/dL — ABNORMAL HIGH (ref 0.61–1.24)
GFR, Estimated: 17 mL/min — ABNORMAL LOW (ref >60)
Glucose, Bld: 101 mg/dL — ABNORMAL HIGH (ref 70–99)
Potassium: 4.2 mmol/L (ref 3.5–5.1)
Sodium: 140 mmol/L (ref 135–145)

## 2024-03-28 LAB — COMPREHENSIVE METABOLIC PANEL WITH GFR
ALT: 30 U/L (ref 0–44)
AST: 91 U/L — ABNORMAL HIGH (ref 15–41)
Albumin: 2.8 g/dL — ABNORMAL LOW (ref 3.5–5.0)
Alkaline Phosphatase: 492 U/L — ABNORMAL HIGH (ref 38–126)
Anion gap: 18 — ABNORMAL HIGH (ref 5–15)
BUN: 63 mg/dL — ABNORMAL HIGH (ref 8–23)
CO2: 18 mmol/L — ABNORMAL LOW (ref 22–32)
Calcium: 7.7 mg/dL — ABNORMAL LOW (ref 8.9–10.3)
Chloride: 101 mmol/L (ref 98–111)
Creatinine, Ser: 4.18 mg/dL — ABNORMAL HIGH (ref 0.61–1.24)
GFR, Estimated: 14 mL/min — ABNORMAL LOW (ref >60)
Glucose, Bld: 129 mg/dL — ABNORMAL HIGH (ref 70–99)
Potassium: 4.6 mmol/L (ref 3.5–5.1)
Sodium: 137 mmol/L (ref 135–145)
Total Bilirubin: 4.2 mg/dL — ABNORMAL HIGH (ref 0.0–1.2)
Total Protein: 5.2 g/dL — ABNORMAL LOW (ref 6.5–8.1)

## 2024-03-28 LAB — MRSA NEXT GEN BY PCR, NASAL: MRSA by PCR Next Gen: NOT DETECTED

## 2024-03-28 LAB — BODY FLUID CELL COUNT WITH DIFFERENTIAL
Eos, Fluid: 0 %
Lymphs, Fluid: 4 %
Monocyte-Macrophage-Serous Fluid: 3 % — ABNORMAL LOW (ref 50–90)
Neutrophil Count, Fluid: 93 % — ABNORMAL HIGH (ref 0–25)
Total Nucleated Cell Count, Fluid: 4474 uL — ABNORMAL HIGH (ref 0–1000)

## 2024-03-28 LAB — LACTATE DEHYDROGENASE, PLEURAL OR PERITONEAL FLUID: LD, Fluid: 465 U/L — ABNORMAL HIGH (ref 3–23)

## 2024-03-28 LAB — ALBUMIN, PLEURAL OR PERITONEAL FLUID: Albumin, Fluid: 1.5 g/dL

## 2024-03-28 LAB — ALBUMIN: Albumin: 2.7 g/dL — ABNORMAL LOW (ref 3.5–5.0)

## 2024-03-28 LAB — LACTATE DEHYDROGENASE: LDH: 935 U/L — ABNORMAL HIGH (ref 105–235)

## 2024-03-28 LAB — LACTIC ACID, PLASMA
Lactic Acid, Venous: 1.9 mmol/L (ref 0.5–1.9)
Lactic Acid, Venous: 1.8 mmol/L (ref 0.5–1.9)

## 2024-03-28 LAB — PREPARE RBC (CROSSMATCH)

## 2024-03-28 LAB — HEPARIN LEVEL (UNFRACTIONATED)
Heparin Unfractionated: <0.1 [IU]/mL — ABNORMAL LOW (ref 0.30–0.70)
Heparin Unfractionated: 0.11 [IU]/mL — ABNORMAL LOW (ref 0.30–0.70)
Heparin Unfractionated: <0.1 [IU]/mL — ABNORMAL LOW (ref 0.30–0.70)

## 2024-03-28 LAB — PRO BRAIN NATRIURETIC PEPTIDE: Pro Brain Natriuretic Peptide: 24567 pg/mL — ABNORMAL HIGH (ref <300.0)

## 2024-03-28 MED ORDER — MIDODRINE HCL 5 MG PO TABS
5.0000 mg | ORAL_TABLET | Freq: Three times a day (TID) | ORAL | Status: DC
  Administered 2024-03-28: 5 mg via ORAL
  Filled 2024-03-28: qty 1

## 2024-03-28 MED ORDER — PIPERACILLIN-TAZOBACTAM 3.375 G IVPB: 3.3750 g | Freq: Three times a day (TID) | INTRAVENOUS | Status: DC

## 2024-03-28 MED ORDER — SODIUM CHLORIDE 0.9 % IV SOLN
2.0000 g | INTRAVENOUS | Status: DC
  Administered 2024-03-28: 2 g via INTRAVENOUS
  Filled 2024-03-28: qty 20

## 2024-03-28 MED ORDER — HEPARIN BOLUS VIA INFUSION
2000.0000 [IU] | Freq: Once | INTRAVENOUS | Status: AC
  Administered 2024-03-28: 2000 [IU] via INTRAVENOUS
  Filled 2024-03-28: qty 2000

## 2024-03-28 MED ORDER — BUMETANIDE 0.25 MG/ML IJ SOLN
1.0000 mg | Freq: Once | INTRAMUSCULAR | Status: AC
  Administered 2024-03-28: 1 mg via INTRAVENOUS
  Filled 2024-03-28: qty 4

## 2024-03-28 MED ORDER — MIDODRINE HCL 5 MG PO TABS
10.0000 mg | ORAL_TABLET | Freq: Three times a day (TID) | ORAL | Status: DC
  Administered 2024-03-28 – 2024-03-29 (×4): 10 mg via ORAL
  Filled 2024-03-28 (×4): qty 2

## 2024-03-28 MED ORDER — LIDOCAINE-EPINEPHRINE (PF) 2 %-1:200000 IJ SOLN
INTRAMUSCULAR | Status: AC
Start: 2024-03-28 — End: 2024-03-28
  Filled 2024-03-28: qty 20

## 2024-03-28 MED ORDER — SODIUM CHLORIDE 0.9 % IV SOLN: 250.0000 mL | INTRAVENOUS | Status: AC

## 2024-03-28 MED ORDER — SODIUM CHLORIDE 0.9 % IV BOLUS
500.0000 mL | Freq: Once | INTRAVENOUS | Status: AC
  Administered 2024-03-28: 500 mL via INTRAVENOUS

## 2024-03-28 MED ORDER — PIPERACILLIN-TAZOBACTAM IN DEX 2-0.25 GM/50ML IV SOLN
2.2500 g | Freq: Three times a day (TID) | INTRAVENOUS | Status: DC
  Administered 2024-03-28 – 2024-03-31 (×8): 2.25 g via INTRAVENOUS
  Filled 2024-03-28 (×9): qty 50

## 2024-03-28 MED ORDER — SODIUM BICARBONATE 650 MG PO TABS
650.0000 mg | ORAL_TABLET | Freq: Three times a day (TID) | ORAL | Status: DC
  Administered 2024-03-28 – 2024-03-30 (×7): 650 mg via ORAL
  Filled 2024-03-28 (×7): qty 1

## 2024-03-28 MED ORDER — ALBUMIN HUMAN 25 % IV SOLN
25.0000 g | Freq: Once | INTRAVENOUS | Status: AC
  Administered 2024-03-28: 25 g via INTRAVENOUS
  Filled 2024-03-28: qty 100

## 2024-03-28 MED ORDER — SODIUM CHLORIDE 0.9% IV SOLUTION: Freq: Once | INTRAVENOUS | Status: AC

## 2024-03-28 MED ORDER — NOREPINEPHRINE 4 MG/250ML-% IV SOLN: 0.0000 ug/min | INTRAVENOUS | Status: DC

## 2024-03-28 MED ORDER — ALBUMIN HUMAN 25 % IV SOLN
12.5000 g | Freq: Once | INTRAVENOUS | Status: AC
  Administered 2024-03-28: 12.5 g via INTRAVENOUS
  Filled 2024-03-28: qty 50

## 2024-03-28 MED ORDER — NOREPINEPHRINE 4 MG/250ML-% IV SOLN
0.0000 ug/min | INTRAVENOUS | Status: DC
Start: 2024-03-28 — End: 2024-03-31
  Administered 2024-03-28 – 2024-03-29 (×2): 8 ug/min via INTRAVENOUS
  Administered 2024-03-29: 5 ug/min via INTRAVENOUS
  Administered 2024-03-29: 8 ug/min via INTRAVENOUS
  Administered 2024-03-30: 9 ug/min via INTRAVENOUS
  Administered 2024-03-30: 11 ug/min via INTRAVENOUS
  Administered 2024-03-30: 10 ug/min via INTRAVENOUS
  Administered 2024-03-31 (×2): 20 ug/min via INTRAVENOUS
  Administered 2024-03-31: 10 ug/min via INTRAVENOUS
  Filled 2024-03-28 (×9): qty 250

## 2024-03-28 MED ORDER — LINEZOLID 600 MG/300ML IV SOLN
600.0000 mg | Freq: Two times a day (BID) | INTRAVENOUS | Status: DC
  Administered 2024-03-28 – 2024-03-31 (×6): 600 mg via INTRAVENOUS
  Filled 2024-03-28 (×7): qty 300

## 2024-03-28 MED ORDER — HEPARIN BOLUS VIA INFUSION
1000.0000 [IU] | Freq: Once | INTRAVENOUS | Status: AC
  Administered 2024-03-28: 1000 [IU] via INTRAVENOUS
  Filled 2024-03-28: qty 1000

## 2024-03-28 NOTE — Procedures (Signed)
 PROCEDURE SUMMARY:  Successful image-guided paracentesis from the right lower abdomen.  Yielded 1.9 liters of dark amber fluid.  Procedure ended prior to complete drainage of fluid due to increasing hypotension during procedure. Patient mentated well throughout. EBL: zero Patient tolerated well.   Specimen sent for labs.  Please see imaging section of Epic for full dictation.  Emillee Talsma NP 03/28/2024 8:48 AM

## 2024-03-28 NOTE — Progress Notes (Signed)
 PHARMACY NOTE:  ANTIMICROBIAL RENAL DOSAGE ADJUSTMENT  Current antimicrobial regimen includes a mismatch between antimicrobial dosage and estimated renal function.  As per policy approved by the Pharmacy & Therapeutics and Medical Executive Committees, the antimicrobial dosage will be adjusted accordingly.  Current antimicrobial dosage:  Zosyn 3.375 g IV q8h extended infusion  Indication: peritonitis  Renal Function:  Estimated Creatinine Clearance: 17.9 mL/min (A) (by C-G formula based on SCr of 3.49 mg/dL (H)).    Antimicrobial dosage has been changed to:  Zosyn 2.25 g IV q8h as 30 min infusion    Thank you for allowing pharmacy to be a part of this patient's care.  Stefano MARLA Bologna, PharmD, BCPS Clinical Pharmacist 03/28/2024 5:03 PM

## 2024-03-28 NOTE — Progress Notes (Addendum)
 PROGRESS NOTE    Jared [name]  FMW:[id number] DOB: 1946/05/06 DOA: 03/26/2024 PCP: [name], FNP   Brief Narrative:  Jared [name] is a 78 y.o. 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  Assessment & Plan:   Principal Problem:   Leukocytosis (leucocytosis)  Hypotension, ongoing - Blood pressure remains low today but generally well tolerated by the patient -  paracentesis without complication this am and continues to be hypotensive despite small volume bolus/albumin/midodrine -Transition to progressive - now on low dose levophed to allow room for ongoing diuresis, bedside ultrasound consistent with volume overload/dilated RV - formal echo pending.  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   Rule out concurrent infection - Patient admitted with markedly elevated leukocyte count in the 50s from baseline of around 25 - No acute complaints at this time other than mild epigastric pain unrelated to p.o. intake, improved with ascites drainage on 03/28/2024 - Ascites Gram stain and culture pending, antibiotics broadened to zosyn/linezolid - Procalcitonin elevated at 3, continue to follow for source - Heme-onc following, appreciate insight recommendations - IPSS-R score of 6 points, median overall survival of 1.6 years, poor prognosis. Not a candidate for bone marrow transplant given his age and functional status  - Routine cultures pending  Hypotension, likely hypovolemic complicated by ascites  - 500 cc bolus, albumin, midodrine initiated -repeat bolus this afternoon with goal MAP greater than 60 -Remains asymptomatic, continue to trend vitals  Elevated D-dimer with positive DVT study PE ruled out - Unable to perform CTA  due to renal function, VQ scan without overt findings of PE - Initiate heparin  drip, follow clinically - DVT study lower extremity positive  Essential hypertension: Chronic diastolic heart failure: Fluids on hold - continue PO PTA Imdur , lisinopril , Farxiga.   Ascites: Paracentesis completed but stopped early due to hypotension Culture pending as above Abdominal pain resolved as above  AKI on CKD stage IIIb: Continue to follow - near baseline after supportive care/gentle fluids overnight   History of gastritis: IV PPI. Advance diet as tolerated   Chronic anemia chronic disease, recent blood loss anemia Hospitalized last month with hemorrhagic gastritis with no evidence of ulcer . Hemoglobin today is stable -continue to follow  DVT prophylaxis:  Code Status:   Code Status: Full Code Family Communication: Wife updated   Status is: Inpatient  Dispo: The patient is from: Home              Anticipated d/c is to: Home              Anticipated d/c date is: 48 to 72 hours              Patient currently not medically stable for discharge  Consultants:  Oncology  Procedures:  None  Antimicrobials:  Ceftriaxone  Subjective: No acute issues or events overnight, generally improving, feels quite well comparatively, epigastric pain resolved after paracentesis  Objective: Vitals:   03/27/24 2051 03/27/24 2247 03/28/24 0500 03/28/24 0507  BP: (!) 91/57 (!) 98/53  (!) 97/59  Pulse: 98 98  99  Resp: 20   18  Temp: 100 F (37.8 C) 99 F (37.2 C)  98.5 F (36.9 C)  TempSrc: Oral Oral  Oral  SpO2: 94%  93%  Weight:   74.9 kg   Height:        Intake/Output Summary (Last 24 hours) at 03/28/2024 0755 Last data filed at 03/28/2024 0419 Gross per 24 hour  Intake 658.81 ml  Output 380 ml  Net 278.81 ml   Filed Weights   03/27/24 0424 03/28/24 0500  Weight: 77.4 kg 74.9 kg    Examination:  General:  Pleasantly resting in bed, No acute distress. HEENT:  Normocephalic  atraumatic.  Sclerae nonicteric, noninjected.  Extraocular movements intact bilaterally. Neck:  Without mass or deformity.  Trachea is midline. Lungs:  Clear to auscultate bilaterally without rhonchi, wheeze, or rales. Heart:  Regular rate and rhythm.  Without murmurs, rubs, or gallops. Abdomen:  Soft, nontender, nondistended.  Without guarding or rebound. Extremities: Without cyanosis, clubbing, edema, or obvious deformity. Skin:  Warm and dry, no erythema.   Data Reviewed: I have personally reviewed following labs and imaging studies  CBC: Recent Labs  Lab 03/26/24 0820 03/26/24 1044 03/26/24 1047 03/26/24 2156 03/27/24 0409 03/28/24 0225  WBC 55.3* 54.5*  --   --  49.8* 62.6*  NEUTROABS 49.8* 51.2*  --   --   --  53.3*  HGB 9.4* 9.6* 11.9* 9.0* 9.2* 8.0*  HCT 29.4* 31.4* 35.0* 28.7* 29.8* 26.0*  MCV 92.7 97.8  --   --  97.1 95.9  PLT 113* 115*  --   --  108* 79*   Basic Metabolic Panel: Recent Labs  Lab 03/26/24 0909 03/26/24 1044 03/26/24 1047 03/26/24 1103 03/27/24 0409 03/28/24 0225  NA 139 138 136  --  139 140  K 3.9 4.3 4.1  --  4.0 4.2  CL 102 100 103  --  102 103  CO2 22 21*  --   --  19* 20*  GLUCOSE 82 87 90  --  92 101*  BUN 51* 52* 49*  --  47* 56*  CREATININE 2.68* 2.84* 3.10*  --  2.74* 3.49*  CALCIUM 8.2* 8.7*  --   --  8.5* 8.1*  MG  --   --   --   --  2.2  --   PHOS  --   --   --  4.3  --   --    GFR: Estimated Creatinine Clearance: 17.9 mL/min (A) (by C-G formula based on SCr of 3.49 mg/dL (H)). Liver Function Tests: Recent Labs  Lab 03/26/24 0909 03/26/24 1044 03/27/24 0409 03/27/24 1513  AST 79* 108* 96*  --   ALT 32 42 33  --   ALKPHOS 397* 516* 465*  --   BILITOT 5.5* 4.9* 4.3*  --   PROT 5.2* 5.9* 5.4*  --   ALBUMIN 3.0* 3.3* 3.0* 2.9*   Recent Labs  Lab 03/26/24 1044  LIPASE 77*   Recent Labs  Lab 03/26/24 1044  AMMONIA 25   Coagulation Profile: Recent Labs  Lab 03/26/24 1044  INR 1.2   Cardiac Enzymes: No  results for input(s): CKTOTAL, CKMB, CKMBINDEX, TROPONINI in the last 168 hours. BNP (last 3 results) Recent Labs    04/17/23 1042  PROBNP 635*   HbA1C: No results for input(s): HGBA1C in the last 72 hours. CBG: No results for input(s): GLUCAP in the last 168 hours. Lipid Profile: No results for input(s): CHOL, HDL, LDLCALC, TRIG, CHOLHDL, LDLDIRECT in the last 72 hours. Thyroid  Function Tests: Recent Labs    03/26/24 0820  TSH 17.700*   Anemia Panel: Recent Labs    03/26/24 1456  VITAMINB12 >4,000*  FOLATE 13.2  FERRITIN 2,842*  TIBC 221*  IRON 46  RETICCTPCT 6.8*   Sepsis Labs: Recent Labs  Lab 03/26/24 1046 03/26/24 1351 03/27/24 0409  PROCALCITON  --   --  3.13  LATICACIDVEN 1.3 1.4  --     Recent Results (from the past 240 hours)  Blood Culture (routine x 2)     Status: None (Preliminary result)   Collection Time: 03/26/24 10:44 AM   Specimen: BLOOD  Result Value Ref Range Status   Specimen Description   Final    BLOOD BLOOD RIGHT ARM Performed at [facility], [address] [place], [place] [address]    Special Requests   Final    BOTTLES DRAWN AEROBIC AND ANAEROBIC Blood Culture adequate volume Performed at [facility], [address], [place], [place] [address]    Culture   Final    NO GROWTH < 24 HOURS Performed at [facility], [address] [place], [place] [address]    Report Status PENDING  Incomplete  Blood Culture (routine x 2)     Status: None (Preliminary result)   Collection Time: 03/26/24 10:58 AM   Specimen: BLOOD  Result Value Ref Range Status   Specimen Description   Final    BLOOD BLOOD LEFT ARM Performed at [facility], [address], [place], [place] [address]    Special Requests   Final    BOTTLES DRAWN AEROBIC AND ANAEROBIC Blood Culture adequate volume Performed at [facility], [address]  [place], [place] [address]    Culture   Final    NO GROWTH < 24 HOURS Performed at [facility], [address], [place], [place] [address]    Report Status PENDING  Incomplete         Radiology Studies: DG CHEST PORT 1 VIEW Result Date: 03/27/2024 EXAM: 1 VIEW(S) XRAY OF THE CHEST 03/27/2024 05:53:00 PM COMPARISON: Chest abdomen and pelvis CT yesterday at 11:26 am. Portable chest yesterday at 11:11 am. CLINICAL HISTORY: [id number] Seen in radiology department [phone] Seen in radiology department [phone] FINDINGS: LUNGS AND PLEURA: Moderate slightly increased left layering pleural effusion is noted, small unchanged right pleural effusion. There is overlying consolidation or atelectasis in the left base. No other focal lung infiltrate is seen with low lung volumes. No pneumothorax. HEART AND MEDIASTINUM: Mild cardiomegaly. No vascular congestion is seen. The aorta is tortuous with a stable mediastinum and scattered calcific plaques. BONES AND SOFT TISSUES: Degenerative change thoracic spine with slight dextroscoliosis. IMPRESSION: 1. Moderate, slightly increased left pleural effusion with small, unchanged right pleural effusion. 2. Left basilar consolidation or atelectasis as before. otherwise clear hypoinflated lungs . 3. Mild cardiomegaly. Electronically signed by: Francis Quam MD 03/27/2024 08:18 PM EST RP Workstation: [id number]   VAS US  LOWER EXTREMITY VENOUS (DVT) Result Date: 03/27/2024  Lower Venous DVT Study Patient Name:  JUSIAH AGUAYO  Date of Exam:   03/27/2024 Medical Rec #: [id number]      Accession #:    [id number] Date of Birth: 09-25-1945      Patient Gender: M Patient Age:   64 years Exam Location:  [facility] Procedure:      VAS US  LOWER EXTREMITY VENOUS (DVT) Referring Phys: ELSIE MONTCLAIR --------------------------------------------------------------------------------  Indications: D-Dimer >20. Ascites, abdominal distention.  Risk Factors: Myelodysplastic  Syndromes/Chronic lymphocytic leukemia diagnosed 2025 Limitations: Edema. Positioning. Comparison Study: No prior study on file Performing Technologist:  Alberta Lis RVS  Examination Guidelines: A complete evaluation includes B-mode imaging, spectral Doppler, color Doppler, and power Doppler as needed of all accessible portions of each vessel. Bilateral testing is considered an integral part of a complete examination. Limited examinations for reoccurring indications may be performed as noted. The reflux portion of the exam is performed with the patient in reverse Trendelenburg.  +---------+---------------+---------+-----------+----------+-------------------+ RIGHT    CompressibilityPhasicitySpontaneityPropertiesThrombus Aging      +---------+---------------+---------+-----------+----------+-------------------+ CFV      Full           Yes      Yes                                      +---------+---------------+---------+-----------+----------+-------------------+ SFJ      Full                                                             +---------+---------------+---------+-----------+----------+-------------------+ FV Prox  Full           Yes      No                                       +---------+---------------+---------+-----------+----------+-------------------+ FV Mid   Full                                                             +---------+---------------+---------+-----------+----------+-------------------+ FV DistalFull                                                             +---------+---------------+---------+-----------+----------+-------------------+ PFV      Full           Yes      Yes                                      +---------+---------------+---------+-----------+----------+-------------------+ POP      Full           Yes      Yes                                       +---------+---------------+---------+-----------+----------+-------------------+ PTV      None                                         Acute               +---------+---------------+---------+-----------+----------+-------------------+ PERO  Not well visualized +---------+---------------+---------+-----------+----------+-------------------+   +---------+---------------+---------+-----------+----------+-------------------+ LEFT     CompressibilityPhasicitySpontaneityPropertiesThrombus Aging      +---------+---------------+---------+-----------+----------+-------------------+ CFV      Full           Yes      Yes                                      +---------+---------------+---------+-----------+----------+-------------------+ SFJ      Full                                                             +---------+---------------+---------+-----------+----------+-------------------+ FV Prox  Full           Yes      Yes                                      +---------+---------------+---------+-----------+----------+-------------------+ FV Mid   Full                                                             +---------+---------------+---------+-----------+----------+-------------------+ FV DistalFull                                                             +---------+---------------+---------+-----------+----------+-------------------+ PFV      Full           Yes      Yes                                      +---------+---------------+---------+-----------+----------+-------------------+ POP                     Yes      Yes                  patent by color and                                                       Doppler             +---------+---------------+---------+-----------+----------+-------------------+ PTV      None                                         Acute                +---------+---------------+---------+-----------+----------+-------------------+ PERO     None  Acute               +---------+---------------+---------+-----------+----------+-------------------+     Summary: RIGHT: - Findings consistent with acute deep vein thrombosis involving the right posterior tibial veins.  Significant interstitial edema noted throughout the extremity  LEFT: - Findings consistent with acute deep vein thrombosis involving the left posterior tibial veins, and left peroneal veins.  Significant interstitial edema noted throughout the extremity.  *See table(s) above for measurements and observations. Electronically signed by Penne Colorado MD on 03/27/2024 at 6:03:26 PM.    Final    NM Pulmonary Perfusion Result Date: 03/27/2024 CLINICAL DATA:  Pulmonary embolism (PE) suspected, high prob EXAM: NUCLEAR MEDICINE PERFUSION LUNG SCAN TECHNIQUE: Perfusion images were obtained in multiple projections after intravenous injection of radiopharmaceutical. No ventilation imaging performed. RADIOPHARMACEUTICALS:  4.18 mCi Tc-42m MAA IV COMPARISON:  Chest radiographs and chest CT 03/26/2024. FINDINGS: There are no wedge-shaped perfusion defects to suggest acute pulmonary embolism. Left pleural effusion noted, similar to recent prior studies. IMPRESSION: No evidence of acute pulmonary embolism on perfusion scintigraphy by PISAPED criteria. Grossly stable small left pleural effusion. Electronically Signed   By: Elsie Perone M.D.   On: 03/27/2024 16:57   US  Abdomen Limited RUQ (LIVER/GB) Result Date: 03/26/2024 CLINICAL DATA:  Abdominal pain. EXAM: ULTRASOUND ABDOMEN LIMITED RIGHT UPPER QUADRANT COMPARISON:  CT dated 03/26/2024. FINDINGS: Gallbladder: There is stone and layering sludge within the gallbladder. The gallbladder wall is mildly thickened measuring 5 mm in thickness, possibly related to ascites. Negative sonographic Murphy's sign. Common bile  duct: Diameter: 8 mm Liver: The liver demonstrates a normal echogenicity. There is apparent minimal irregularity of the liver contour which may represent changes of cirrhosis. Clinical correlation recommended. Portal vein is patent on color Doppler imaging with normal direction of blood flow towards the liver. Other: Moderate ascites noted. IMPRESSION: 1. Gallbladder stone and sludge. No sonographic evidence of acute cholecystitis. 2. Moderate ascites. 3. Questionable cirrhosis.  Clinical correlation is recommended. Electronically Signed   By: Vanetta Chou M.D.   On: 03/26/2024 14:03   CT CHEST ABDOMEN PELVIS WO CONTRAST Result Date: 03/26/2024 EXAM: CT CHEST, ABDOMEN AND PELVIS WITHOUT CONTRAST 03/26/2024 11:41:11 AM TECHNIQUE: CT of the chest, abdomen and pelvis was performed without the administration of intravenous contrast. Multiplanar reformatted images are provided for review. Automated exposure control, iterative reconstruction, and/or weight based adjustment of the mA/kV was utilized to reduce the radiation dose to as low as reasonably achievable. COMPARISON: CT of the abdomen and pelvis dated 03/01/2024. CLINICAL HISTORY: History of hemorrhagic gastritis. Distended abdomen. Likely ascites. Increased bilirubin level. FINDINGS: CHEST: MEDIASTINUM AND LYMPH NODES: There is moderate calcific coronary artery disease present. Pericardium is unremarkable. The central airways are clear. No mediastinal, hilar or axillary lymphadenopathy. There is moderate calcific atheromatous disease within the thoracic aorta. The esophagus is unremarkable. LUNGS AND PLEURA: There is a small patchy, reticular area of opacification/consolidation present anteriorly within the right upper lobe underlying the right first costosternal joint. There are patchy, streaky bronchovascular opacities present within the lung bases bilaterally. There is improved aeration of the lower lobes relative to the previous study. The left-sided  pleural effusion has mildly worsened in the interim. No pneumothorax. ABDOMEN AND PELVIS: LIVER: The liver is unremarkable. GALLBLADDER AND BILE DUCTS: There is radiopaque material laying independently within the gallbladder. No biliary ductal dilatation. SPLEEN: The spleen is enlarged. PANCREAS: No acute abnormality. ADRENAL GLANDS: No acute abnormality. KIDNEYS, URETERS AND BLADDER: No stones in the kidneys or ureters. No  hydronephrosis. No perinephric or periureteral stranding. Urinary bladder is unremarkable. GI AND BOWEL: Stomach demonstrates no acute abnormality. There is no bowel obstruction. REPRODUCTIVE ORGANS: No acute abnormality. PERITONEUM AND RETROPERITONEUM: There is moderate ascites present. No free air. VASCULATURE: Aorta is normal in caliber. ABDOMINAL AND PELVIS LYMPH NODES: No lymphadenopathy. BONES AND SOFT TISSUES: The patient is status post left total hip arthroplasty. There are advanced degenerative changes within the lower lumbar spine. The patient is status post decompression laminectomies. No acute osseous abnormality. No focal soft tissue abnormality. IMPRESSION: 1. Small patchy reticular opacification/consolidation in the right upper lobe and patchy streaky bronchovascular opacities at the lung bases bilaterally; aeration of the lower lobes has improved compared to 03/01/24 2. Mildly worsened left pleural effusion 3. Moderate ascites 4. Splenomegaly 5. Radiopaque material within the gallbladder, compatible with gallstones 6. Moderate calcific coronary artery disease and moderate calcific atheromatous disease of the thoracic aorta Electronically signed by: Evalene Coho MD 03/26/2024 12:18 PM EST RP Workstation: [id number]   DG Chest Port 1 View Result Date: 03/26/2024 CLINICAL DATA:  Possible sepsis. EXAM: PORTABLE CHEST 1 VIEW COMPARISON:  09/20/2017 FINDINGS: Lungs are hypoinflated with bibasilar opacification left worse than right likely small layering left effusion with  bibasilar atelectasis. Infection in the lung bases is possible. Cardiomediastinal silhouette and remainder of the exam is unchanged. IMPRESSION: Hypoinflation with bibasilar opacification left worse than right likely small layering left effusion with bibasilar atelectasis. Infection in the lung bases is possible. Electronically Signed   By: Toribio Agreste M.D.   On: 03/26/2024 11:42        Scheduled Meds:  brimonidine   1 drop Left Eye BID   Chlorhexidine  Gluconate Cloth  6 each Topical Daily   dorzolamide -timolol   1 drop Left Eye BID   feeding supplement  1 Container Oral TID BM   latanoprost   1 drop Left Eye QHS   pantoprazole  (PROTONIX ) IV  40 mg Intravenous Q24H   pneumococcal 20-valent conjugate vaccine  0.5 mL Intramuscular Tomorrow-1000   sodium chloride  flush  3 mL Intravenous Q12H   tamsulosin   0.4 mg Oral QHS   Continuous Infusions:  heparin  1,550 Units/hr (03/28/24 0457)     LOS: 2 days   Time spent:  Elsie JAYSON Montclair, DO [facility]  If 7PM-7AM, please contact night-coverage [email or web address]  03/28/2024, 7:55 AM

## 2024-03-28 NOTE — Evaluation (Addendum)
 Occupational Therapy Evaluation Patient Details Name: Jared [name] MRN: [id number] DOB: [birth year] Today's Date: 03/28/2024   History of Present Illness   Patient is an 78 y/o male admitted 03/26/24 from heme-onc clinic with elevated white count and neutrophils with concern for infection.  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  Positive for dehydration, ascites and plan for paracentesis--11/15 (completed).  PMH positive for HTN, CKD 3, CHF, h/o tobacco use, arthritis with prior orthopedic surgeries.     Clinical Impressions This 78 yo male admitted and underwent above presents to acute OT with PLOF of needing more or less A for basic ADLs and transfers depending on he was feeling that day. He currently is rather lethargic and slow to respond with BP 70/45 then took again with it being 62/44--made RN aware of this and that IV was occluded. He will continue to benefit from acute OT with follow up from continued inpatient follow up therapy, <3 hours/day unless he progresses more quickly once medically stable then HHOT may be more appropriate.      If plan is discharge home, recommend the following:   Two people to help with walking and/or transfers;A lot of help with bathing/dressing/bathroom     Functional Status Assessment   Patient has had a recent decline in their functional status and demonstrates the ability to make significant improvements in function in a reasonable and predictable amount of time.     Equipment Recommendations   None recommended by OT      Precautions/Restrictions   Precautions Precautions: Fall Recall of Precautions/Restrictions: Impaired (pt having a hard time staying awake) Restrictions Weight Bearing Restrictions Per Provider Order: No     Mobility Bed Mobility Overal bed mobility: Needs Assistance Bed Mobility: Rolling Rolling: Max assist         General bed mobility comments: needed step by step cues and physical A            ADL  either performed or assessed with clinical judgement   ADL Overall ADL's : Needs assistance/impaired                                       General ADL Comments: total A at bed level     Vision Baseline Vision/History: 1 Wears glasses Patient Visual Report: No change from baseline              Pertinent Vitals/Pain Pain Assessment Pain Assessment: Faces Faces Pain Scale: Hurts little more Pain Location: abdomen--right side Pain Descriptors / Indicators: Discomfort Pain Intervention(s): Limited activity within patient's tolerance, Monitored during session, Repositioned     Extremity/Trunk Assessment Upper Extremity Assessment Upper Extremity Assessment: Generalized weakness RUE Deficits / Details: shoulder ROM limitations, distally 3/5 LUE Deficits / Details: shoulder ROM limitations, distally 3/5           Communication Communication Communication: Impaired Factors Affecting Communication:  (slow to respond at times today)   Cognition Arousal: Lethargic Behavior During Therapy: Flat affect               OT - Cognition Comments: slow to respond, eyes opening only when spoken to                 Following commands: Impaired Following commands impaired: Follows one step commands inconsistently     Cueing    Cueing Techniques: Verbal cues;Tactile cues  Home Living Family/patient expects to be discharged to:: Private residence Living Arrangements: Spouse/significant other;Children Available Help at Discharge: Family Type of Home: House Home Access: Stairs to enter;Ramped entrance Entrance Stairs-Number of Steps: 4 Entrance Stairs-Rails: Right;Left Home Layout: Laundry or work area in basement;Two level Alternate Level Stairs-Number of Steps: two story home with main level and basement level; his office is in the basement   Bathroom Shower/Tub: Walk-in shower   Bathroom Toilet: Handicapped height     Home  Equipment: Pharmacist, Hospital (2 wheels);Transport chair          Prior Functioning/Environment Prior Level of Function : Needs assist               ADLs Comments: wife helps with showering    OT Problem List: Decreased strength;Decreased range of motion;Decreased activity tolerance;Impaired balance (sitting and/or standing);Pain;Cardiopulmonary status limiting activity   OT Treatment/Interventions: Self-care/ADL training;Patient/family education;DME and/or AE instruction;Balance training      OT Goals(Current goals can be found in the care plan section)   Acute Rehab OT Goals Patient Stated Goal: to reposition in bed OT Goal Formulation: Patient unable to participate in goal setting Time For Goal Achievement: 04/11/24 Potential to Achieve Goals: Fair ADL Goals Pt Will Transfer to Toilet: with min assist;ambulating;bedside commode (over toilet) Pt Will Perform Toileting - Clothing Manipulation and hygiene: with min assist;sit to/from stand Additional ADL Goal #1: Pt will be min A in and OOB for basic ADLs Additional ADL Goal #2: Pt will be able to sit EOB with no more than min A for ADLs and in preparation for transfers   OT Frequency:  Min 2X/week       AM-PAC OT 6 Clicks Daily Activity     Outcome Measure Help from another person eating meals?: Total Help from another person taking care of personal grooming?: Total Help from another person toileting, which includes using toliet, bedpan, or urinal?: Total Help from another person bathing (including washing, rinsing, drying)?: Total Help from another person to put on and taking off regular upper body clothing?: Total Help from another person to put on and taking off regular lower body clothing?: Total 6 Click Score: 6   End of Session Nurse Communication:  (low BP's and IV was occluded)  Activity Tolerance: Patient limited by fatigue;Patient limited by lethargy Patient left: in bed;with call bell/phone  within reach;with bed alarm set;with family/visitor present  OT Visit Diagnosis: Other abnormalities of gait and mobility (R26.89);Muscle weakness (generalized) (M62.81);Pain Pain - Right/Left: Right Pain - part of body:  (lower abdomen)                Time: 8484-8467 OT Time Calculation (min): 17 min Charges:  OT General Charges $OT Visit: 1 Visit OT Evaluation $OT Eval Low Complexity: 1 Low  Donny BECKER OT Acute Rehabilitation Services Office [phone]    [place] Dorothyann Distel 03/28/2024, 4:52 PM

## 2024-03-28 NOTE — Progress Notes (Addendum)
 PHARMACY - ANTICOAGULATION CONSULT NOTE  Pharmacy Consult for IV heparin  Indication: DVT  No Known Allergies  Patient Measurements: Height: 5' 11 (180.3 cm) Weight: 77.4 kg (170 lb 10.2 oz) IBW/kg (Calculated) : 75.3 HEPARIN  DW (KG): 77.4  Vital Signs: Temp: 99 F (37.2 C) (11/14 2247) Temp Source: Oral (11/14 2247) BP: 98/53 (11/14 2247) Pulse Rate: 98 (11/14 2247)  Labs: Recent Labs    03/26/24 0820 03/26/24 0909 03/26/24 1044 03/26/24 1047 03/26/24 1456 03/26/24 2156 03/27/24 0409 03/28/24 0225  HGB 9.4*  --  9.6* 11.9*  --  9.0* 9.2*  --   HCT 29.4*  --  31.4* 35.0*  --  28.7* 29.8*  --   PLT 113*  --  115*  --   --   --  108*  --   APTT  --   --   --   --  40*  --   --   --   LABPROT  --   --  15.5*  --   --   --   --   --   INR  --   --  1.2  --   --   --   --   --   HEPARINUNFRC  --   --   --   --   --   --   --  <0.10*  CREATININE  --    < > 2.84* 3.10*  --   --  2.74*  --    < > = values in this interval not displayed.    Estimated Creatinine Clearance: 22.9 mL/min (A) (by C-G formula based on SCr of 2.74 mg/dL (H)).   Medical History: Past Medical History:  Diagnosis Date   Arthritis    knees and back   Enlarged prostate    takes Flomax    Headache    Hypertension    stress test done about 12 yrs ago - results wnl per pt.    Medications:  Scheduled:   brimonidine   1 drop Left Eye BID   Chlorhexidine  Gluconate Cloth  6 each Topical Daily   dorzolamide -timolol   1 drop Left Eye BID   feeding supplement  1 Container Oral TID BM   latanoprost   1 drop Left Eye QHS   pantoprazole  (PROTONIX ) IV  40 mg Intravenous Q24H   pneumococcal 20-valent conjugate vaccine  0.5 mL Intramuscular Tomorrow-1000   sodium chloride  flush  3 mL Intravenous Q12H   tamsulosin   0.4 mg Oral QHS   Infusions:   heparin  1,300 Units/hr (03/27/24 1838)   PRN: acetaminophen  **OR** acetaminophen , HYDROcodone -acetaminophen , morphine injection, ondansetron  **OR** ondansetron   (ZOFRAN ) IV  Assessment: Patient sent from [facility] for elevated WBC found to have new bilateral DVTs to start IV heparin . Plts 108 low. SCr 2.74 elevated.   03/28/2024 Heparin  level < 0.1, subtherapeutic on 1300 units/hr CBC in process No bleeding per RN  Goal of Therapy:  Heparin  level 0.3-0.7 units/ml Monitor platelets by anticoagulation protocol: Yes   Plan:  IV heparin  2000 unit bolus then Increase heparin  drip to 1550 units/hr Check heparin  level in 8 hours Daily CBC  Leeroy Mace RPh 03/28/2024, 2:51 AM

## 2024-03-28 NOTE — Progress Notes (Signed)
 [facility]  HEMATOLOGY/ONCOLOGY IN-PATIENT PROGRESS NOTE   PATIENT NAME: Jared [name]   MR#: [id number] DOB: [birth year] CSN#: [id number]   DATE OF SERVICE: 03/28/2024  ASSESSMENT & PLAN:   78 y.o. gentleman with known history of MDS, IPSS-R score of 6 and also history of CLL, who is followed by Dr. Loretha in our clinic, was admitted from oncology clinic on 03/26/2024 after he presented with abdominal pain, with concern for SBP/ bacterial infection, in the context of ascites, leukocytosis.  MDS/ Leukocytosis:  Diagnosed earlier this year.  IPSS-R score of 6 points, median overall survival of 1.6 years, poor prognosis.  Not a candidate for bone marrow transplant given his age and functional status. - Has leukocytosis currently, likely reactionary from infection/SBP. - He did undergo paracentesis earlier today. - Leukocytosis fluctuating.  White count was 62,600 today with ANC 57,300.  No overt blasts in the periphery. - Recent flow cytometry did not show significant increase in blasts.  Not indicative of AML transformation clinically.  Left shift, suggestive of bone marrow distress - Continue to monitor blood counts daily and transfuse as needed to maintain hemoglobin above 8 and platelet count above 20,000.  Hemoglobin at 8 today. - No overt evidence of DIC.  Normal fibrinogen level.  D-dimer is not reliable in his case as it could be acute phase reactant from current acute illness and also related to bilateral DVT.  Bilateral DVT - Currently on heparin  drip.  Continue current management.  CLL - He has been on observation only for this.  Ascites - Underwent IR guided paracentesis today with removal of 1.9 L of dark amber fluid.  Patient subjectively feeling better after this.  Fluid analysis results pending. - Receiving albumin post paracentesis.  Also is on midodrine for mild hypotension.  Rest of the management is as per primary team.  Please call us  with any  questions.   Chinita Patten, MD 03/28/2024 12:10 PM  PERTINENT HISTORY:  MDS, IPSS-R score of 6 points, Median OS of 1.6 yrs, with 1.4 yrs to 25% AML evolution. Bone marrow transplant not feasible due to age and medical fitness. - Recommend supportive care and we will consider hypomethylating agents down the lane if he has worsening multiple cytopenias. - Labs today, consider PRBC transfusion to maintain a Hb of 8 g/dl - Order weekly EPO or darbepoietin to improve hemoglobin. - EPO levels to be drawn today, will conder ESA if Epo levels less than 200 mu/ml - Dose of EPO alpha is 40000-60000 units/week [place],  - He understands that this is not a curative treatment and we have discussed the prognosis.   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  SUBJECTIVE:   Patient seen and evaluated.  He was resting in the bed.  Feeling subjectively better after paracentesis this morning.  Wife was by the bedside.  No fevers.  Denies any new complaints.  OBJECTIVE:  Vitals:   03/28/24 0833 03/28/24 0909  BP: (!) 91/54 (!) 82/50  Pulse:  100  Resp:  19  Temp:  98.3 F (36.8 C)  SpO2:  99%     Intake/Output Summary (Last 24 hours) at 03/28/2024 1210 Last data filed at 03/28/2024 0419 Gross per 24 hour  Intake 343.58 ml  Output 380 ml  Net -36.42 ml    Physical Exam Constitutional:      Appearance: Normal appearance.     Comments: Frail-appearing, elderly gentleman, in no acute distress.  HENT:     Head: Normocephalic and atraumatic.  Eyes:     Conjunctiva/sclera: Conjunctivae normal.  Cardiovascular:     Rate and Rhythm: Normal rate and regular rhythm.  Pulmonary:     Effort: Pulmonary effort is normal. No respiratory distress.  Abdominal:     General: There is distension.  Neurological:     General: No focal deficit present.     Mental Status: He is alert and oriented to person, place, and time.     LABS:   Results for  orders placed or performed during the hospital encounter of 03/26/24 (from the past 24 hours)  Albumin     Status: Abnormal   Collection Time: 03/27/24  3:13 PM  Result Value Ref Range   Albumin 2.9 (L) 3.5 - 5.0 g/dL  Lactate dehydrogenase     Status: Abnormal   Collection Time: 03/27/24  3:13 PM  Result Value Ref Range   LDH 989 (H) 105 - 235 U/L  CBC with Differential/Platelet     Status: Abnormal   Collection Time: 03/28/24  2:25 AM  Result Value Ref Range   WBC 62.6 (HH) 4.0 - 10.5 K/uL   RBC 2.71 (L) 4.22 - 5.81 MIL/uL   Hemoglobin 8.0 (L) 13.0 - 17.0 g/dL   HCT 73.9 (L) 60.9 - 47.9 %   MCV 95.9 80.0 - 100.0 fL   MCH 29.5 26.0 - 34.0 pg   MCHC 30.8 30.0 - 36.0 g/dL   RDW 69.2 (H) 88.4 - 84.4 %   Platelets 79 (L) 150 - 400 K/uL   nRBC 4.9 (H) 0.0 - 0.2 %   Neutrophils Relative % 85 %   Neutro Abs 53.3 (H) 1.7 - 7.7 K/uL   Lymphocytes Relative 4 %   Lymphs Abs 2.4 0.7 - 4.0 K/uL   Monocytes Relative 2 %   Monocytes Absolute 1.4 (H) 0.1 - 1.0 K/uL   Eosinophils Relative 0 %   Eosinophils Absolute 0.0 0.0 - 0.5 K/uL   Basophils Relative 2 %   Basophils Absolute 1.5 (H) 0.0 - 0.1 K/uL   Immature Granulocytes 7 %   Abs Immature Granulocytes 4.05 (H) 0.00 - 0.07 K/uL  Basic metabolic panel with GFR     Status: Abnormal   Collection Time: 03/28/24  2:25 AM  Result Value Ref Range   Sodium 140 135 - 145 mmol/L   Potassium 4.2 3.5 - 5.1 mmol/L   Chloride 103 98 - 111 mmol/L   CO2 20 (L) 22 - 32 mmol/L   Glucose, Bld 101 (H) 70 - 99 mg/dL   BUN 56 (H) 8 - 23 mg/dL   Creatinine, Ser 6.50 (H) 0.61 - 1.24 mg/dL   Calcium 8.1 (L) 8.9 - 10.3 mg/dL   GFR, Estimated 17 (L) >60 mL/min   Anion gap 17 (H) 5 - 15  Heparin  level (unfractionated)     Status: Abnormal   Collection Time: 03/28/24  2:25 AM  Result Value Ref Range   Heparin  Unfractionated <0.10 (L) 0.30 - 0.70 IU/mL  Lactate dehydrogenase (pleural or peritoneal fluid)     Status: Abnormal   Collection Time: 03/28/24   8:42 AM  Result Value Ref Range   LD, Fluid 465 (H) 3 - 23 U/L   Fluid Type-FLDH CYTO PERI   Body fluid cell count with differential     Status: Abnormal   Collection Time: 03/28/24  8:42 AM  Result Value Ref Range   Fluid Type-FCT CYTO PERI  Color, Fluid RED (A) YELLOW   Appearance, Fluid TURBID (A) CLEAR   Total Nucleated Cell Count, Fluid 4,474 (H) 0 - 1,000 cu mm   Neutrophil Count, Fluid 93 (H) 0 - 25 %   Lymphs, Fluid 4 %   Monocyte-Macrophage-Serous Fluid 3 (L) 50 - 90 %   Eos, Fluid 0 %  Albumin, pleural or peritoneal fluid      Status: None   Collection Time: 03/28/24  8:42 AM  Result Value Ref Range   Albumin, Fluid 1.5 g/dL   Fluid Type-FALB CYTO PERI   Heparin  level (unfractionated)     Status: Abnormal   Collection Time: 03/28/24 11:20 AM  Result Value Ref Range   Heparin  Unfractionated 0.11 (L) 0.30 - 0.70 IU/mL     IMAGING STUDIES:   US  Paracentesis Result Date: 03/28/2024 INDICATION: Inpatient with CLL, CKD 3a with concern for peritonitis. Recent imaging demonstrates ascites. EXAM: ULTRASOUND GUIDED DIAGNOSTIC AND THERAPEUTIC PARACENTESIS MEDICATIONS: 10 mL 1% lidocaine  COMPLICATIONS: None immediate. PROCEDURE: Informed written consent was obtained from the patient after a discussion of the risks, benefits and alternatives to treatment. A timeout was performed prior to the initiation of the procedure. Initial ultrasound scanning demonstrates a moderate amount of ascites within the right lower abdominal quadrant. The right lower abdomen was prepped and draped in the usual sterile fashion. 1% lidocaine  was used for local anesthesia. Following this, a 19 gauge, 7-cm, Yueh catheter was introduced. An ultrasound image was saved for documentation purposes. The paracentesis was performed. The paracentesis was stopped prior to complete drainage of fluid due to concern for hypotension. The catheter was removed and a dressing was applied. The patient tolerated the procedure well  without immediate post procedural complication. FINDINGS: A total of approximately 1.9 liters of dark amber fluid was removed. Samples were sent to the laboratory as requested by the clinical team. IMPRESSION: Successful ultrasound-guided paracentesis yielding 1.9 liters of peritoneal fluid. Performed by Laymon Coast, NP under the supervision of Dr. Luverne PLAN: If the patient eventually requires >/=2 paracenteses in a 30 day period, candidacy for formal evaluation by the [facility] Interventional Radiology [facility] Hypertension [facility] will be assessed. Electronically Signed   By: Marcey Luverne M.D.   On: 03/28/2024 09:00   DG CHEST PORT 1 VIEW Result Date: 03/27/2024 EXAM: 1 VIEW(S) XRAY OF THE CHEST 03/27/2024 05:53:00 PM COMPARISON: Chest abdomen and pelvis CT yesterday at 11:26 am. Portable chest yesterday at 11:11 am. CLINICAL HISTORY: [id number] Seen in radiology department [phone] Seen in radiology department [phone] FINDINGS: LUNGS AND PLEURA: Moderate slightly increased left layering pleural effusion is noted, small unchanged right pleural effusion. There is overlying consolidation or atelectasis in the left base. No other focal lung infiltrate is seen with low lung volumes. No pneumothorax. HEART AND MEDIASTINUM: Mild cardiomegaly. No vascular congestion is seen. The aorta is tortuous with a stable mediastinum and scattered calcific plaques. BONES AND SOFT TISSUES: Degenerative change thoracic spine with slight dextroscoliosis. IMPRESSION: 1. Moderate, slightly increased left pleural effusion with small, unchanged right pleural effusion. 2. Left basilar consolidation or atelectasis as before. otherwise clear hypoinflated lungs . 3. Mild cardiomegaly. Electronically signed by: Francis Quam MD 03/27/2024 08:18 PM EST RP Workstation: [id number]   VAS US  LOWER EXTREMITY VENOUS (DVT) Result Date: 03/27/2024  Lower Venous DVT Study Patient Name:  CRIMSON BEER  Date of Exam:   03/27/2024 Medical  Rec #: [id number]      Accession #:    [id number] Date of Birth: [birth year]  Patient Gender: M Patient Age:   70 years Exam Location:  [facility] Procedure:      VAS US  LOWER EXTREMITY VENOUS (DVT) Referring Phys: ELSIE MONTCLAIR --------------------------------------------------------------------------------  Indications: D-Dimer >20. Ascites, abdominal distention.  Risk Factors: Myelodysplastic Syndromes/Chronic lymphocytic leukemia diagnosed 2025 Limitations: Edema. Positioning. Comparison Study: No prior study on file Performing Technologist: Alberta Lis RVS  Examination Guidelines: A complete evaluation includes B-mode imaging, spectral Doppler, color Doppler, and power Doppler as needed of all accessible portions of each vessel. Bilateral testing is considered an integral part of a complete examination. Limited examinations for reoccurring indications may be performed as noted. The reflux portion of the exam is performed with the patient in reverse Trendelenburg.  +---------+---------------+---------+-----------+----------+-------------------+ RIGHT    CompressibilityPhasicitySpontaneityPropertiesThrombus Aging      +---------+---------------+---------+-----------+----------+-------------------+ CFV      Full           Yes      Yes                                      +---------+---------------+---------+-----------+----------+-------------------+ SFJ      Full                                                             +---------+---------------+---------+-----------+----------+-------------------+ FV Prox  Full           Yes      No                                       +---------+---------------+---------+-----------+----------+-------------------+ FV Mid   Full                                                             +---------+---------------+---------+-----------+----------+-------------------+ FV DistalFull                                                              +---------+---------------+---------+-----------+----------+-------------------+ PFV      Full           Yes      Yes                                      +---------+---------------+---------+-----------+----------+-------------------+ POP      Full           Yes      Yes                                      +---------+---------------+---------+-----------+----------+-------------------+ PTV      None  Acute               +---------+---------------+---------+-----------+----------+-------------------+ PERO                                                  Not well visualized +---------+---------------+---------+-----------+----------+-------------------+   +---------+---------------+---------+-----------+----------+-------------------+ LEFT     CompressibilityPhasicitySpontaneityPropertiesThrombus Aging      +---------+---------------+---------+-----------+----------+-------------------+ CFV      Full           Yes      Yes                                      +---------+---------------+---------+-----------+----------+-------------------+ SFJ      Full                                                             +---------+---------------+---------+-----------+----------+-------------------+ FV Prox  Full           Yes      Yes                                      +---------+---------------+---------+-----------+----------+-------------------+ FV Mid   Full                                                             +---------+---------------+---------+-----------+----------+-------------------+ FV DistalFull                                                             +---------+---------------+---------+-----------+----------+-------------------+ PFV      Full           Yes      Yes                                       +---------+---------------+---------+-----------+----------+-------------------+ POP                     Yes      Yes                  patent by color and                                                       Doppler             +---------+---------------+---------+-----------+----------+-------------------+ PTV      None  Acute               +---------+---------------+---------+-----------+----------+-------------------+ PERO     None                                         Acute               +---------+---------------+---------+-----------+----------+-------------------+     Summary: RIGHT: - Findings consistent with acute deep vein thrombosis involving the right posterior tibial veins.  Significant interstitial edema noted throughout the extremity  LEFT: - Findings consistent with acute deep vein thrombosis involving the left posterior tibial veins, and left peroneal veins.  Significant interstitial edema noted throughout the extremity.  *See table(s) above for measurements and observations. Electronically signed by Penne Colorado MD on 03/27/2024 at 6:03:26 PM.    Final    NM Pulmonary Perfusion Result Date: 03/27/2024 CLINICAL DATA:  Pulmonary embolism (PE) suspected, high prob EXAM: NUCLEAR MEDICINE PERFUSION LUNG SCAN TECHNIQUE: Perfusion images were obtained in multiple projections after intravenous injection of radiopharmaceutical. No ventilation imaging performed. RADIOPHARMACEUTICALS:  4.18 mCi Tc-23m MAA IV COMPARISON:  Chest radiographs and chest CT 03/26/2024. FINDINGS: There are no wedge-shaped perfusion defects to suggest acute pulmonary embolism. Left pleural effusion noted, similar to recent prior studies. IMPRESSION: No evidence of acute pulmonary embolism on perfusion scintigraphy by PISAPED criteria. Grossly stable small left pleural effusion. Electronically Signed   By: Elsie Perone M.D.   On: 03/27/2024 16:57   US   Abdomen Limited RUQ (LIVER/GB) Result Date: 03/26/2024 CLINICAL DATA:  Abdominal pain. EXAM: ULTRASOUND ABDOMEN LIMITED RIGHT UPPER QUADRANT COMPARISON:  CT dated 03/26/2024. FINDINGS: Gallbladder: There is stone and layering sludge within the gallbladder. The gallbladder wall is mildly thickened measuring 5 mm in thickness, possibly related to ascites. Negative sonographic Murphy's sign. Common bile duct: Diameter: 8 mm Liver: The liver demonstrates a normal echogenicity. There is apparent minimal irregularity of the liver contour which may represent changes of cirrhosis. Clinical correlation recommended. Portal vein is patent on color Doppler imaging with normal direction of blood flow towards the liver. Other: Moderate ascites noted. IMPRESSION: 1. Gallbladder stone and sludge. No sonographic evidence of acute cholecystitis. 2. Moderate ascites. 3. Questionable cirrhosis.  Clinical correlation is recommended. Electronically Signed   By: Vanetta Chou M.D.   On: 03/26/2024 14:03   CT CHEST ABDOMEN PELVIS WO CONTRAST Result Date: 03/26/2024 EXAM: CT CHEST, ABDOMEN AND PELVIS WITHOUT CONTRAST 03/26/2024 11:41:11 AM TECHNIQUE: CT of the chest, abdomen and pelvis was performed without the administration of intravenous contrast. Multiplanar reformatted images are provided for review. Automated exposure control, iterative reconstruction, and/or weight based adjustment of the mA/kV was utilized to reduce the radiation dose to as low as reasonably achievable. COMPARISON: CT of the abdomen and pelvis dated 03/01/2024. CLINICAL HISTORY: History of hemorrhagic gastritis. Distended abdomen. Likely ascites. Increased bilirubin level. FINDINGS: CHEST: MEDIASTINUM AND LYMPH NODES: There is moderate calcific coronary artery disease present. Pericardium is unremarkable. The central airways are clear. No mediastinal, hilar or axillary lymphadenopathy. There is moderate calcific atheromatous disease within the thoracic  aorta. The esophagus is unremarkable. LUNGS AND PLEURA: There is a small patchy, reticular area of opacification/consolidation present anteriorly within the right upper lobe underlying the right first costosternal joint. There are patchy, streaky bronchovascular opacities present within the lung bases bilaterally. There is improved aeration of the lower lobes relative to the previous study. The  left-sided pleural effusion has mildly worsened in the interim. No pneumothorax. ABDOMEN AND PELVIS: LIVER: The liver is unremarkable. GALLBLADDER AND BILE DUCTS: There is radiopaque material laying independently within the gallbladder. No biliary ductal dilatation. SPLEEN: The spleen is enlarged. PANCREAS: No acute abnormality. ADRENAL GLANDS: No acute abnormality. KIDNEYS, URETERS AND BLADDER: No stones in the kidneys or ureters. No hydronephrosis. No perinephric or periureteral stranding. Urinary bladder is unremarkable. GI AND BOWEL: Stomach demonstrates no acute abnormality. There is no bowel obstruction. REPRODUCTIVE ORGANS: No acute abnormality. PERITONEUM AND RETROPERITONEUM: There is moderate ascites present. No free air. VASCULATURE: Aorta is normal in caliber. ABDOMINAL AND PELVIS LYMPH NODES: No lymphadenopathy. BONES AND SOFT TISSUES: The patient is status post left total hip arthroplasty. There are advanced degenerative changes within the lower lumbar spine. The patient is status post decompression laminectomies. No acute osseous abnormality. No focal soft tissue abnormality. IMPRESSION: 1. Small patchy reticular opacification/consolidation in the right upper lobe and patchy streaky bronchovascular opacities at the lung bases bilaterally; aeration of the lower lobes has improved compared to 03/01/24 2. Mildly worsened left pleural effusion 3. Moderate ascites 4. Splenomegaly 5. Radiopaque material within the gallbladder, compatible with gallstones 6. Moderate calcific coronary artery disease and moderate  calcific atheromatous disease of the thoracic aorta Electronically signed by: Evalene Coho MD 03/26/2024 12:18 PM EST RP Workstation: [id number]   DG Chest Port 1 View Result Date: 03/26/2024 CLINICAL DATA:  Possible sepsis. EXAM: PORTABLE CHEST 1 VIEW COMPARISON:  09/20/2017 FINDINGS: Lungs are hypoinflated with bibasilar opacification left worse than right likely small layering left effusion with bibasilar atelectasis. Infection in the lung bases is possible. Cardiomediastinal silhouette and remainder of the exam is unchanged. IMPRESSION: Hypoinflation with bibasilar opacification left worse than right likely small layering left effusion with bibasilar atelectasis. Infection in the lung bases is possible. Electronically Signed   By: Toribio Agreste M.D.   On: 03/26/2024 11:42   DG Abd 1 View Result Date: 03/04/2024 CLINICAL DATA:  Abdominal distension. EXAM: ABDOMEN - 1 VIEW COMPARISON:  Radiograph dated 02/27/2024. FINDINGS: No bowel dilatation or evidence of obstruction. Air and contrast noted in the colon. Degenerative changes spine. Total left hip arthroplasty. No acute osseous pathology. IMPRESSION: Nonobstructive bowel gas pattern. Electronically Signed   By: Vanetta Chou M.D.   On: 03/04/2024 18:35   US  RENAL Result Date: 03/03/2024 CLINICAL DATA:  Renal failure. EXAM: RENAL / URINARY TRACT ULTRASOUND COMPLETE COMPARISON:  None Available. FINDINGS: Right Kidney: Renal measurements: 10.0 cm x 5.3 cm x 5.2 cm = volume: 142.6 mL. There is diffusely increased echogenicity of the renal parenchyma. A 2.3 cm x 2.1 cm x 1.6 cm simple right renal cyst is seen within the mid right kidney. No hydronephrosis is visualized. Left Kidney: Renal measurements: 10.3 cm x 4.6 cm x 5.1 cm = volume: 125.1 mL. There is diffusely increased echogenicity of the renal parenchyma. A 1.9 cm x 1.7 cm x 1.6 cm simple left renal cyst is seen with the mid to lower left kidney. No hydronephrosis is visualized. Bladder:  Appears normal for degree of bladder distention. The bilateral ureteral jets are not visualized peer Other: There is a moderate amount of ascites. IMPRESSION: 1. Bilateral echogenic kidneys which likely represents sequelae associated with medical renal disease. 2. Bilateral simple renal cysts. 3. Ascites. Electronically Signed   By: Suzen Dials M.D.   On: 03/03/2024 18:03   CT ABDOMEN PELVIS WO CONTRAST Result Date: 03/01/2024 CLINICAL DATA:  Abdominal distension. EXAM: CT ABDOMEN  AND PELVIS WITHOUT CONTRAST TECHNIQUE: Multidetector CT imaging of the abdomen and pelvis was performed following the standard protocol without IV contrast. RADIATION DOSE REDUCTION: This exam was performed according to the departmental dose-optimization program which includes automated exposure control, adjustment of the mA and/or kV according to patient size and/or use of iterative reconstruction technique. COMPARISON:  CT abdomen pelvis dated 02/24/2024. FINDINGS: Evaluation of this exam is limited in the absence of intravenous contrast as well as due to respiratory motion. Evaluation is also limited due to anasarca and streak artifact caused by left hip arthroplasty. Lower chest: Small bilateral pleural effusions. Large areas of consolidative changes involving the lower lobes bilaterally may represent atelectasis or pneumonia and progressed since the prior CT. No intra-abdominal free air. Diffuse mesenteric edema and small ascites. Hepatobiliary: The liver is grossly unremarkable. No biliary dilatation. Gallstone. Vicarious excretion of contrast also noted in the gallbladder. Pancreas: Unremarkable. No pancreatic ductal dilatation or surrounding inflammatory changes. Spleen: Normal in size without focal abnormality. Adrenals/Urinary Tract: The adrenal glands unremarkable. There is no hydronephrosis or nephrolithiasis on either side. Bilateral perinephric stranding, nonspecific. The urinary bladder is suboptimally evaluated  due to streak artifact caused by left hip arthroplasty. Stomach/Bowel: Oral contrast noted throughout the bowel and colon. There is sigmoid diverticulosis. No bowel obstruction. Normal appendix. Vascular/Lymphatic: Moderate aortoiliac atherosclerotic disease. The IVC is grossly unremarkable. No portal venous gas. No obvious adenopathy. Reproductive: The prostate gland is poorly visualized. Other: Diffuse mesenteric and subcutaneous edema and anasarca, worsened since the prior CT. Musculoskeletal: Osteopenia with degenerative changes. Total left hip arthroplasty. No acute osseous pathology. IMPRESSION: 1. No bowel obstruction. Normal appendix. 2. Sigmoid diverticulosis. 3. Cholelithiasis. 4. Small bilateral pleural effusions with large areas of consolidative changes involving the lower lobes bilaterally may represent atelectasis or pneumonia and progressed since the prior CT. 5. Anasarca, worsened since the prior CT. 6.  Aortic Atherosclerosis (ICD10-I70.0). Electronically Signed   By: Vanetta Chou M.D.   On: 03/01/2024 15:51   US  Paracentesis Result Date: 02/27/2024 INDICATION: Therapeutic and diagnostic removal of ascites fluid. EXAM: ULTRASOUND GUIDED right-sided PARACENTESIS MEDICATIONS: None. COMPLICATIONS: None immediate. PROCEDURE: Informed written consent was obtained from the patient after a discussion of the risks, benefits and alternatives to treatment. A timeout was performed prior to the initiation of the procedure. Initial ultrasound scanning demonstrates a moderate amount of ascites within the right lower abdominal quadrant. The right lower abdomen was prepped and draped in the usual sterile fashion. 1% lidocaine  was used for local anesthesia. Following this, a 19 gauge 7 cm Yueh catheter was introduced. An ultrasound image was saved for documentation purposes. The paracentesis was performed. The catheter was removed and a dressing was applied. The patient tolerated the procedure well without  immediate post procedural complication. FINDINGS: A total of approximately 1.2 of sanguinous fluid was removed. Samples were sent to the laboratory as requested by the clinical team. IMPRESSION: Successful ultrasound-guided paracentesis yielding 1.2 liters of peritoneal fluid. Electronically Signed   By: Cordella Banner   On: 02/27/2024 16:33

## 2024-03-28 NOTE — Progress Notes (Signed)
 PHARMACY - ANTICOAGULATION CONSULT NOTE  Pharmacy Consult for IV heparin  Indication: DVT  No Known Allergies  Patient Measurements: Height: 5' 11 (180.3 cm) Weight: 74.9 kg (165 lb 2 oz) IBW/kg (Calculated) : 75.3 HEPARIN  DW (KG): 77.4  Vital Signs: Temp: 98.3 F (36.8 C) (11/15 0909) Temp Source: Oral (11/15 0909) BP: 82/50 (11/15 0909) Pulse Rate: 100 (11/15 0909)  Labs: Recent Labs    03/26/24 1044 03/26/24 1047 03/26/24 1456 03/26/24 2156 03/27/24 0409 03/28/24 0225 03/28/24 1120  HGB 9.6* 11.9*  --  9.0* 9.2* 8.0*  --   HCT 31.4* 35.0*  --  28.7* 29.8* 26.0*  --   PLT 115*  --   --   --  108* 79*  --   APTT  --   --  40*  --   --   --   --   LABPROT 15.5*  --   --   --   --   --   --   INR 1.2  --   --   --   --   --   --   HEPARINUNFRC  --   --   --   --   --  <0.10* 0.11*  CREATININE 2.84* 3.10*  --   --  2.74* 3.49*  --     Estimated Creatinine Clearance: 17.9 mL/min (A) (by C-G formula based on SCr of 3.49 mg/dL (H)).   Medical History: Past Medical History:  Diagnosis Date   Arthritis    knees and back   Enlarged prostate    takes Flomax    Headache    Hypertension    stress test done about 12 yrs ago - results wnl per pt.    Medications:  Scheduled:   brimonidine   1 drop Left Eye BID   Chlorhexidine  Gluconate Cloth  6 each Topical Daily   dorzolamide -timolol   1 drop Left Eye BID   feeding supplement  1 Container Oral TID BM   latanoprost   1 drop Left Eye QHS   midodrine  5 mg Oral TID WC   pantoprazole  (PROTONIX ) IV  40 mg Intravenous Q24H   pneumococcal 20-valent conjugate vaccine  0.5 mL Intramuscular Tomorrow-1000   sodium chloride  flush  3 mL Intravenous Q12H   tamsulosin   0.4 mg Oral QHS   Infusions:   albumin human     cefTRIAXone (ROCEPHIN)  IV 2 g (03/28/24 1129)   heparin  1,550 Units/hr (03/28/24 0906)   PRN: acetaminophen  **OR** acetaminophen , HYDROcodone -acetaminophen , ondansetron  **OR** ondansetron  (ZOFRAN )  IV  Assessment: Patient sent from cancer center for elevated WBC found to have new bilateral DVTs to start IV heparin .  03/28/2024 -Heparin  level 0.11, remains subtherapeutic on 1300 units/hr -Hgb/plt trending down -S/p paracentesis this morning - heparin  without pause per RN -No complications of therapy noted per discussion with RN  Goal of Therapy:  Heparin  level 0.3-0.7 units/ml Monitor platelets by anticoagulation protocol: Yes   Plan:  Small heparin  bolus 1000 units given paracentesis this morning Increase heparin  infusion to 1750 units/hr Check heparin  level in 8 hours Daily CBC - monitor closely given trend downward   Stefano MARLA Bologna, PharmD, BCPS Clinical Pharmacist 03/28/2024 12:40 PM

## 2024-03-28 NOTE — Consult Note (Addendum)
 NAME:  Jared [name], MRN:  [id number], DOB:  1945/06/11, LOS: 2 ADMISSION DATE:  03/26/2024, CONSULTATION DATE:  @TODAY @ REFERRING MD:  Dr. 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  HPI: presented to ED 02/24/24 sent from heme-onc clinic for an elevated white count and neutrophils concerning for infection.  Patient appears dehydrated, has scleral icterus, abdominal distention consistent with ascites, abdomen is not taut but soft patient does have pressure on exam but no guarding organomegaly or tenderness.   ED Course:  Initial CBC shows white count of 55.3 hemoglobin of 9.4 platelets of 113. Initial CMP shows BUN of 51 CKD with a creatinine of 2.68 alk phos of 397, albumin of 3.0, AST of 79, total protein of 5.2, total bili of 5.5, LDH of 1113. Liver/biliary ultrasound ordered showing questionable cirrhosis and moderate ascites. CT C/A/P shows bibasilar consolidations, massive ascites in abdomen, splenomegaly.  Hospital Course: Underwent US  duplex LE and was found to have DVT in left posterior tibial vein and left peroneal vein V/Q scan negative for VTE Started on broad spectrum antimicrobials including Vancomycin and Zosyn De-escalated to Ceftraixone on 11/15 Underwent IR paracentesis and drained 1.9 L ascitic fluid, BP started to deteriorate after that Given albumin and midodrine for hypotension Rapid response called on 11/15 due to hypotension, brought to ICU for pressor support.  Pertinent  Medical History   Past Medical History:  Diagnosis Date   Arthritis    knees and  back   Enlarged prostate    takes Flomax    Headache    Hypertension    stress test done about 12 yrs ago - results wnl per pt.   Significant Hospital Events: Including procedures, antibiotic start and stop dates in addition to other pertinent events   As noted above  Interim History / Subjective:  Patient feels tired. Abdominal distention improved. Notes recent fall and injuring his abdomen.  Objective    Blood pressure (!) 66/41, pulse 88, temperature 99.8 F (37.7 C), temperature source Oral, resp. rate (!) 23, height 5' 11 (1.803 m), weight 74.9 kg, SpO2 96%.        Intake/Output Summary (Last 24 hours) at 03/28/2024 1636 Last data filed at 03/28/2024 0419 Gross per 24 hour  Intake 319.42 ml  Output 380 ml  Net -60.58 ml   Filed Weights   03/27/24 0424 03/28/24 0500  Weight: 77.4 kg 74.9 kg   Examination: General: thin, frail, pale HENT: pale conjunctivae, anicteric sclera, PERRL, nasal and oral mucosa dry Lungs: CTAB Cardiovascular: JVD markedly elevated to the jaw, RRR, no murmurs. Abdomen: ascites present, slightly tender to palpation in RLQ Extremities: trace edema Neuro: alert, oriented x 3 GU: Deferred POCUS: LVH, EF estimate 60-65%, RV dilated and dysfunctional, RVH present indicative of chronic PH, IVC dilated and non-collapsible.  Resolved problem list   Assessment and Plan   #Shock: likely mixed cardiogenic shock due to RV failure and septic due to peritonitis - Levophed gtt - Target MAP > 65 mmHg - STOP IVF resuscitation - Given albumin - Midodrine 10 mg TID - Bumex 1 mg IVP x 1 - Echo  to evaluate RV function and estimate PA pressure.  #Peritonitis: peritoneal fluid 4474, 93% PMNs - Escalate antimicrobials to Linezolid and Zosyn - D/C Ceftriaxone - FU blood and peritoneal cultures  #Stress Ulcer PPx: PPI  #Nutrition: NPO except meds/sips  #AKI on CKD stage III: likely cardiorenal due to RV failure - Diuresis plan as above - Renally  adjust medications - Avoid Nephrotoxins - Strict I/Os - Foley catheter  #High anion gap metabolic acidosis: likely due to renal failure. - Bicarb tabs  #DVT: - Heparin  drip  #MDS #CLL #Chronic Anemia - Appreciate hematology consult - Transfuse for Hgb < 7 mg/dL  Disposition: ICU appropriate for shock state requiring vasopressors  Labs   CBC: Recent Labs  Lab 03/26/24 0820 03/26/24 1044 03/26/24 1047 03/26/24 2156 03/27/24 0409 03/28/24 0225  WBC 55.3* 54.5*  --   --  49.8* 62.6*  NEUTROABS 49.8* 51.2*  --   --   --  53.3*  HGB 9.4* 9.6* 11.9* 9.0* 9.2* 8.0*  HCT 29.4* 31.4* 35.0* 28.7* 29.8* 26.0*  MCV 92.7 97.8  --   --  97.1 95.9  PLT 113* 115*  --   --  108* 79*    Basic Metabolic Panel: Recent Labs  Lab 03/26/24 0909 03/26/24 1044 03/26/24 1047 03/26/24 1103 03/27/24 0409 03/28/24 0225  NA 139 138 136  --  139 140  K 3.9 4.3 4.1  --  4.0 4.2  CL 102 100 103  --  102 103  CO2 22 21*  --   --  19* 20*  GLUCOSE 82 87 90  --  92 101*  BUN 51* 52* 49*  --  47* 56*  CREATININE 2.68* 2.84* 3.10*  --  2.74* 3.49*  CALCIUM 8.2* 8.7*  --   --  8.5* 8.1*  MG  --   --   --   --  2.2  --   PHOS  --   --   --  4.3  --   --    GFR: Estimated Creatinine Clearance: 17.9 mL/min (A) (by C-G formula based on SCr of 3.49 mg/dL (H)). Recent Labs  Lab 03/26/24 0820 03/26/24 1044 03/26/24 1046 03/26/24 1351 03/27/24 0409 03/28/24 0225  PROCALCITON  --   --   --   --  3.13  --   WBC 55.3* 54.5*  --   --  49.8* 62.6*  LATICACIDVEN  --   --  1.3 1.4  --   --     Liver Function Tests: Recent Labs  Lab 03/26/24 0909 03/26/24 1044 03/27/24 0409 03/27/24 1513 03/28/24 1120  AST 79* 108* 96*  --   --   ALT 32 42 33  --   --   ALKPHOS 397* 516* 465*  --   --   BILITOT 5.5* 4.9* 4.3*  --   --   PROT 5.2* 5.9* 5.4*  --   --   ALBUMIN 3.0* 3.3* 3.0* 2.9* 2.7*   Recent Labs  Lab 03/26/24 1044  LIPASE 77*   Recent Labs  Lab 03/26/24 1044  AMMONIA 25     ABG    Component Value Date/Time   PHART 7.378 04/23/2023 0955   PCO2ART 35.5 04/23/2023 0955   PO2ART 78 (L) 04/23/2023 0955   HCO3 23.1 04/23/2023 0955   HCO3 20.9 04/23/2023 0955   TCO2 21 (L) 03/26/2024 1047   ACIDBASEDEF 2.0 04/23/2023 0955   ACIDBASEDEF 4.0 (H) 04/23/2023 0955   O2SAT 68 04/23/2023 0955  O2SAT 95 04/23/2023 0955     Coagulation Profile: Recent Labs  Lab 03/26/24 1044  INR 1.2    Cardiac Enzymes: No results for input(s): CKTOTAL, CKMB, CKMBINDEX, TROPONINI in the last 168 hours.  HbA1C: No results found for: HGBA1C  CBG: No results for input(s): GLUCAP in the last 168 hours.  Review of Systems:   Not obtained  Past Medical History:  He,  has a past medical history of Arthritis, Enlarged prostate, Headache, and Hypertension.   Surgical History:   Past Surgical History:  Procedure Laterality Date   BACK SURGERY  2002   CORONARY PRESSURE/FFR STUDY N/A 04/23/2023   Procedure: CORONARY PRESSURE/FFR STUDY;  Surgeon: Elmira Newman PARAS, MD;  Location: [facility];  Service: Cardiovascular;  Laterality: N/A;   ESOPHAGOGASTRODUODENOSCOPY N/A 02/27/2024   Procedure: EGD (ESOPHAGOGASTRODUODENOSCOPY);  Surgeon: Elicia Claw, MD;  Location: [facility];  Service: Gastroenterology;  Laterality: N/A;   JOINT REPLACEMENT     KNEE ARTHROSCOPY Left 05/2004   thelbert 09/27/2010   RIGHT/LEFT HEART CATH AND CORONARY ANGIOGRAPHY N/A 04/23/2023   Procedure: RIGHT/LEFT HEART CATH AND CORONARY ANGIOGRAPHY;  Surgeon: Elmira Newman PARAS, MD;  Location: [facility];  Service: Cardiovascular;  Laterality: N/A;   SHOULDER ARTHROSCOPY WITH OPEN ROTATOR CUFF REPAIR Right 2009   /notes 09/13/2010   TOTAL HIP ARTHROPLASTY Left 10/02/2017   TOTAL HIP ARTHROPLASTY Left 10/02/2017   Procedure: LEFT TOTAL HIP ARTHROPLASTY ANTERIOR APPROACH;  Surgeon: Liam Lerner, MD;  Location: [facility];  Service: Orthopedics;  Laterality: Left;   TOTAL  KNEE ARTHROPLASTY  04/16/2011   Procedure: TOTAL KNEE ARTHROPLASTY;  Surgeon: Lerner PARAS Liam;  Location: [facility];  Service: Orthopedics;  Laterality: Left;  LEFT TOTAL KNEE ARTHROPLASTY    TOTAL KNEE ARTHROPLASTY Right 04/19/2014   Procedure: RIGHT TOTAL KNEE ARTHROPLASTY;  Surgeon: Lerner PARAS Liam, MD;  Location: [facility];  Service: Orthopedics;  Laterality: Right;     Social History:   reports that he quit smoking about 49 years ago. His smoking use included cigarettes. He started smoking about 59 years ago. He has a 10 pack-year smoking history. He has never used smokeless tobacco. He reports that he does not drink alcohol and does not use drugs.   Family History:  His family history includes Cancer in his sister and sister; Migraines in his mother.   Allergies No Known Allergies   Home Medications  Prior to Admission medications   Medication Sig Start Date End Date Taking? Authorizing Provider  acetaminophen  (TYLENOL ) 650 MG CR tablet Take 1,300 mg by mouth every 8 (eight) hours as needed for pain.   Yes [provider]  amLODipine  (NORVASC ) 10 MG tablet Take 10 mg by mouth daily.   Yes [provider]  aspirin  EC (ASPIRIN  LOW DOSE) 81 MG tablet TAKE 1 TABLET (81 MG TOTAL) BY MOUTH DAILY. SWALLOW WHOLE. 09/05/23  Yes Patwardhan, Manish J, MD  atorvastatin (LIPITOR) 10 MG tablet Take 10 mg by mouth daily. 12/22/21  Yes [provider]  Azelastine HCl 137 MCG/SPRAY SOLN Place 1 spray into both nostrils every 12 (twelve) hours as needed (for allergies or rhinitis). 05/15/23  Yes [provider]  brimonidine  (ALPHAGAN ) 0.2 % ophthalmic solution Place 1 drop into the left eye 2 (two) times daily.   Yes [provider]  dorzolamide -timolol  (COSOPT ) 2-0.5 % ophthalmic solution Place 1 drop into the left eye 2 (two) times daily.   Yes [provider]  empagliflozin (JARDIANCE) 10 MG TABS  tablet Take 10 mg by mouth daily.   Yes [provider]   isosorbide  mononitrate (IMDUR ) 30 MG 24 hr tablet Take 0.5 tablets (15 mg total) by mouth daily. You can take an additional 15 mg as needed for chest pain. 05/17/23 02/26/24 Yes Wyn Jackee VEAR Mickey., NP  lisinopril  (ZESTRIL ) 20 MG tablet Take 1 tablet (20 mg total) by mouth at bedtime. 05/29/23  Yes Wyn Jackee VEAR Mickey., NP  nitroGLYCERIN  (NITROSTAT ) 0.4 MG SL tablet Place 1 tablet (0.4 mg total) under the tongue every 5 (five) minutes as needed for chest pain. 04/17/23  Yes Patwardhan, Manish J, MD  pantoprazole  (PROTONIX ) 40 MG tablet Take 1 tablet (40 mg total) by mouth 2 (two) times daily for 56 days, THEN 1 tablet (40 mg total) daily for 28 days. 03/08/24 05/31/24 Yes [place], Eric J, DO  sodium bicarbonate 650 MG tablet Take 1 tablet (650 mg total) by mouth 2 (two) times daily. 03/08/24 06/06/24 Yes [place], Eric J, DO  tamsulosin  (FLOMAX ) 0.4 MG CAPS capsule Take 0.4 mg by mouth at bedtime.   Yes [provider]  latanoprost  (XALATAN ) 0.005 % ophthalmic solution Place 1 drop into the left eye at bedtime. Patient not taking: Reported on 03/27/2024    [provider]  lidocaine  (LIDODERM ) 5 % Place 1 patch onto the skin daily as needed (for pain- Remove & Discard patch within 12 hours or as directed by MD). Patient not taking: Reported on 03/27/2024    [provider]     Due to a high probability of clinically significant, life threatening deterioration, the patient required my highest level of preparedness to intervene emergently and I personally spent this critical care time directly and personally managing the patient. This critical care time included obtaining a history; examining the patient; pulse oximetry; ordering and review of studies; arranging urgent treatment with development of a management plan; evaluation of patient's response to treatment; frequent reassessment; and, discussions with other providers.  This critical care time was performed to assess and manage  the high probability of imminent, life-threatening deterioration that could result in multi-organ failure. It was exclusive of separately billable procedures and treating other patients and teaching time. Critical Care Time: 60 minutes.  Paula Southerly, MD [facility]

## 2024-03-29 ENCOUNTER — Inpatient Hospital Stay (HOSPITAL_COMMUNITY)

## 2024-03-29 DIAGNOSIS — I270 Primary pulmonary hypertension: Secondary | ICD-10-CM

## 2024-03-29 DIAGNOSIS — E790 Hyperuricemia without signs of inflammatory arthritis and tophaceous disease: Secondary | ICD-10-CM

## 2024-03-29 DIAGNOSIS — E7220 Disorder of urea cycle metabolism, unspecified: Secondary | ICD-10-CM

## 2024-03-29 DIAGNOSIS — R1084 Generalized abdominal pain: Secondary | ICD-10-CM | POA: Diagnosis not present

## 2024-03-29 DIAGNOSIS — K659 Peritonitis, unspecified: Secondary | ICD-10-CM | POA: Diagnosis not present

## 2024-03-29 DIAGNOSIS — N183 Chronic kidney disease, stage 3 unspecified: Secondary | ICD-10-CM | POA: Diagnosis not present

## 2024-03-29 DIAGNOSIS — R188 Other ascites: Secondary | ICD-10-CM | POA: Diagnosis not present

## 2024-03-29 DIAGNOSIS — K652 Spontaneous bacterial peritonitis: Secondary | ICD-10-CM

## 2024-03-29 DIAGNOSIS — C9110 Chronic lymphocytic leukemia of B-cell type not having achieved remission: Secondary | ICD-10-CM | POA: Diagnosis not present

## 2024-03-29 DIAGNOSIS — D469 Myelodysplastic syndrome, unspecified: Secondary | ICD-10-CM | POA: Diagnosis not present

## 2024-03-29 DIAGNOSIS — R579 Shock, unspecified: Secondary | ICD-10-CM | POA: Diagnosis not present

## 2024-03-29 DIAGNOSIS — N179 Acute kidney failure, unspecified: Secondary | ICD-10-CM | POA: Diagnosis not present

## 2024-03-29 LAB — CBC
HCT: 24.8 % — ABNORMAL LOW (ref 39.0–52.0)
Hemoglobin: 8 g/dL — ABNORMAL LOW (ref 13.0–17.0)
MCH: 29.7 pg (ref 26.0–34.0)
MCHC: 32.3 g/dL (ref 30.0–36.0)
MCV: 92.2 fL (ref 80.0–100.0)
Platelets: 70 K/uL — ABNORMAL LOW (ref 150–400)
RBC: 2.69 MIL/uL — ABNORMAL LOW (ref 4.22–5.81)
RDW: 27.6 % — ABNORMAL HIGH (ref 11.5–15.5)
WBC: 61 K/uL (ref 4.0–10.5)
nRBC: 3.3 % — ABNORMAL HIGH (ref 0.0–0.2)

## 2024-03-29 LAB — MAGNESIUM: Magnesium: 2.1 mg/dL (ref 1.7–2.4)

## 2024-03-29 LAB — ECHOCARDIOGRAM COMPLETE
Area-P 1/2: 4.68 cm2
Calc EF: 66.6 %
Height: 71 in
MV M vel: 4.68 m/s
MV Peak grad: 87.6 mmHg
Radius: 0.6 cm
S' Lateral: 2.5 cm
Single Plane A2C EF: 67.7 %
Single Plane A4C EF: 63 %
Weight: 2758.4 [oz_av]

## 2024-03-29 LAB — COMPREHENSIVE METABOLIC PANEL WITH GFR
ALT: 31 U/L (ref 0–44)
AST: 96 U/L — ABNORMAL HIGH (ref 15–41)
Albumin: 2.9 g/dL — ABNORMAL LOW (ref 3.5–5.0)
Alkaline Phosphatase: 524 U/L — ABNORMAL HIGH (ref 38–126)
Anion gap: 17 — ABNORMAL HIGH (ref 5–15)
BUN: 67 mg/dL — ABNORMAL HIGH (ref 8–23)
CO2: 18 mmol/L — ABNORMAL LOW (ref 22–32)
Calcium: 7.5 mg/dL — ABNORMAL LOW (ref 8.9–10.3)
Chloride: 100 mmol/L (ref 98–111)
Creatinine, Ser: 4.17 mg/dL — ABNORMAL HIGH (ref 0.61–1.24)
GFR, Estimated: 14 mL/min — ABNORMAL LOW (ref >60)
Glucose, Bld: 137 mg/dL — ABNORMAL HIGH (ref 70–99)
Potassium: 4.4 mmol/L (ref 3.5–5.1)
Sodium: 135 mmol/L (ref 135–145)
Total Bilirubin: 4.8 mg/dL — ABNORMAL HIGH (ref 0.0–1.2)
Total Protein: 5.3 g/dL — ABNORMAL LOW (ref 6.5–8.1)

## 2024-03-29 LAB — BASIC METABOLIC PANEL WITH GFR
Anion gap: 19 — ABNORMAL HIGH (ref 5–15)
BUN: 68 mg/dL — ABNORMAL HIGH (ref 8–23)
CO2: 17 mmol/L — ABNORMAL LOW (ref 22–32)
Calcium: 7.4 mg/dL — ABNORMAL LOW (ref 8.9–10.3)
Chloride: 99 mmol/L (ref 98–111)
Creatinine, Ser: 4.2 mg/dL — ABNORMAL HIGH (ref 0.61–1.24)
GFR, Estimated: 14 mL/min — ABNORMAL LOW (ref >60)
Glucose, Bld: 112 mg/dL — ABNORMAL HIGH (ref 70–99)
Potassium: 4.4 mmol/L (ref 3.5–5.1)
Sodium: 135 mmol/L (ref 135–145)

## 2024-03-29 LAB — HEPARIN LEVEL (UNFRACTIONATED)
Heparin Unfractionated: 0.15 [IU]/mL — ABNORMAL LOW (ref 0.30–0.70)
Heparin Unfractionated: 0.23 [IU]/mL — ABNORMAL LOW (ref 0.30–0.70)

## 2024-03-29 LAB — URIC ACID: Uric Acid, Serum: 15.5 mg/dL — ABNORMAL HIGH (ref 3.7–8.6)

## 2024-03-29 MED ORDER — BUMETANIDE 0.25 MG/ML IJ SOLN
2.0000 mg | Freq: Once | INTRAMUSCULAR | Status: AC
  Administered 2024-03-29: 2 mg via INTRAVENOUS
  Filled 2024-03-29: qty 8

## 2024-03-29 MED ORDER — ORAL CARE MOUTH RINSE: 15.0000 mL | OROMUCOSAL | Status: DC | PRN

## 2024-03-29 MED ORDER — ALLOPURINOL 100 MG PO TABS
50.0000 mg | ORAL_TABLET | ORAL | Status: DC
  Administered 2024-03-29: 50 mg via ORAL
  Filled 2024-03-29: qty 1

## 2024-03-29 MED ORDER — MELATONIN 5 MG PO TABS
5.0000 mg | ORAL_TABLET | Freq: Every evening | ORAL | Status: DC | PRN
  Administered 2024-03-29 (×2): 5 mg via ORAL
  Filled 2024-03-29 (×2): qty 1

## 2024-03-29 MED ORDER — SODIUM CHLORIDE 0.9 % IV SOLN
6.0000 mg | Freq: Once | INTRAVENOUS | Status: AC
  Administered 2024-03-29: 6 mg via INTRAVENOUS
  Filled 2024-03-29: qty 4

## 2024-03-29 MED ORDER — HEPARIN BOLUS VIA INFUSION
1000.0000 [IU] | Freq: Once | INTRAVENOUS | Status: AC
  Administered 2024-03-29: 1000 [IU] via INTRAVENOUS
  Filled 2024-03-29: qty 1000

## 2024-03-29 MED ORDER — HEPARIN BOLUS VIA INFUSION
1100.0000 [IU] | Freq: Once | INTRAVENOUS | Status: AC
  Administered 2024-03-29: 1100 [IU] via INTRAVENOUS
  Filled 2024-03-29: qty 1100

## 2024-03-29 NOTE — Progress Notes (Signed)
 PHARMACY - ANTICOAGULATION CONSULT NOTE  Pharmacy Consult for IV heparin  Indication: DVT  No Known Allergies  Patient Measurements: Height: 5' 11 (180.3 cm) Weight: 78.2 kg (172 lb 6.4 oz) IBW/kg (Calculated) : 75.3 HEPARIN  DW (KG): 77.4  Vital Signs: Temp: 98.8 F (37.1 C) (11/16 1300) Temp Source: Oral (11/16 1300) BP: 94/45 (11/16 1426)  Labs: Recent Labs    03/26/24 1456 03/26/24 2156 03/28/24 0225 03/28/24 1120 03/28/24 1617 03/28/24 1919 03/29/24 0239 03/29/24 0430 03/29/24 1214  HGB  --    < > 8.0*  --  7.1*  --   --  8.0*  --   HCT  --    < > 26.0*  --  23.2*  --   --  24.8*  --   PLT  --    < > 79*  --  66*  --   --  70*  --   APTT 40*  --   --   --   --   --   --   --   --   HEPARINUNFRC  --   --  <0.10*   < >  --  <0.10* 0.15*  --  0.23*  CREATININE  --    < > 3.49*  --  4.18*  --   --  4.17* 4.20*   < > = values in this interval not displayed.    Estimated Creatinine Clearance: 14.9 mL/min (A) (by C-G formula based on SCr of 4.2 mg/dL (H)).   Medical History: Past Medical History:  Diagnosis Date   Arthritis    knees and back   Enlarged prostate    takes Flomax    Headache    Hypertension    stress test done about 12 yrs ago - results wnl per pt.    Medications:  Scheduled:   allopurinol  50 mg Oral QODAY   brimonidine   1 drop Left Eye BID   Chlorhexidine  Gluconate Cloth  6 each Topical Daily   dorzolamide -timolol   1 drop Left Eye BID   feeding supplement  1 Container Oral TID BM   latanoprost   1 drop Left Eye QHS   midodrine  10 mg Oral TID WC   pantoprazole  (PROTONIX ) IV  40 mg Intravenous Q24H   pneumococcal 20-valent conjugate vaccine  0.5 mL Intramuscular Tomorrow-1000   sodium bicarbonate  650 mg Oral TID   sodium chloride  flush  3 mL Intravenous Q12H   tamsulosin   0.4 mg Oral QHS   Infusions:   sodium chloride  Stopped (03/28/24 1640)   heparin  1,950 Units/hr (03/29/24 1426)   linezolid (ZYVOX) IV Stopped (03/29/24 0818)    norepinephrine (LEVOPHED) Adult infusion 8 mcg/min (03/29/24 1426)   piperacillin-tazobactam (ZOSYN)  IV Stopped (03/29/24 1422)   PRN: acetaminophen  **OR** acetaminophen , HYDROcodone -acetaminophen , melatonin, ondansetron  **OR** ondansetron  (ZOFRAN ) IV, mouth rinse  Assessment: Patient sent from cancer center for elevated WBC found to have new bilateral DVTs to start IV heparin .  03/29/2024 -Heparin  level 0.23, remains subtherapeutic (trending up) after increase in infusion to 1950 units/hr -CBC - low but stable -No complications of therapy noted per discussion with RN  Goal of Therapy:  Heparin  level 0.3-0.7 units/ml Monitor platelets by anticoagulation protocol: Yes   Plan:  Bolus heparin  1100 units Increase heparin  infusion to 2150 units/hr Check heparin  level in 8 hours Daily CBC - monitor closely    Stefano MARLA Bologna, PharmD, BCPS Clinical Pharmacist 03/29/2024 2:32 PM

## 2024-03-29 NOTE — Progress Notes (Signed)
  Echocardiogram 2D Echocardiogram has been performed.  Charmaine KANDICE Gaskins 03/29/2024, 8:36 AM

## 2024-03-29 NOTE — Consult Note (Addendum)
 Renal Service Consult Note [facility] Jared [name] Jared Fret, MD  Patient: Jared [name] Date: 03/29/2024 Requesting Physician: Dr. Catherine  Reason for Consult: Renal failure  HPI: The patient is a 78 y.o. year-old w/ PMH of  CLL, MDS, HTN, CKD 3a, chronic diastolic HF who presented 11/13 to ED sent from hem-onc clinic for ^^WBC/ pmn's concerning for infection. In ED BP was 115/77, HR 87, RR 15-20, temp 97, 94% sats on RA. Labs showed K+ 4.1, bun 49,  creat 2.6, alb 3.3, uric acid 15.7, lipase 77, ast 108, alt 42, tbili 4.9, LDH 1113. LA 1.3, 1.4. WBC 54K, Hb 11.9. D-dimer > 20, PT 15.5, INR 1.2, PTT 40. TSH 17. UA 0-5 rbc/wbc, prot 30.  Pt looked dehydrated w/ ascites, scleral icterus. Found to have DVT in L leg. V/Q was negative. Pt started on broad-spec IV abx, had paracentesis w/ IR for 1.9 L. BP started to deteriorate after that. Given albumin and midodrine, then needed levophed for BP in the 70s. Pt moved to ICU. Creatinine has increased up to 3.5 yesterday and 4.1 today. We are asked to see for renal failure.    Pt seen in room. BP's were 110-120s on presentation 11/13. On 11/14 bp's dropped to 90s and on 11/15 into the 70s-80s. Levophed drip started  11/15 in the afternoon. UOP was 400 11/13, then 380 on 11/14, 350 cc on 11/15 and 550 cc today. Creat bumped up from 2.6 on 11/13, to 2.7 on 11/14, to 3.4 on 11/15 and 4.1 today. IVFs 5.1 L in and 1.9 L out = +3.2 L in total. Wt's are up from 75kg to 78 kg (discounting 1st wt usually pulled from prior admission). CXR 11/13 showed bibasilar opacification left > right likely small layering left effusion with bibasilar atelectasis. CT chest/abd /pelvis 11/13 showed small patchy reticular opacification/consolidation in the RUL + patchy bronchovascular opacities at the bases bilaterally. RUQ US  11/13 showed GB stone+ sludge, mod ascites and questionable cirrhosis. CXR 11/14 showed increased L effusion with small R effusion, + L basilar  consolidation/ atelectasis as before. Paracentesis of 1.9 L was done by IR on 11/15.   Have d/w pt's family. Pt is a poor candidate for RRT due to his serious comorbidities. Questions answered.   ROS - denies CP, no joint pain, no HA, no blurry vision   Past Medical History  Past Medical History:  Diagnosis Date   Arthritis    knees and back   Enlarged prostate    takes Flomax    Headache    Hypertension    stress test done about 12 yrs ago - results wnl per pt.   Past Surgical History  Past Surgical History:  Procedure Laterality Date   BACK SURGERY  2002   CORONARY PRESSURE/FFR STUDY N/A 04/23/2023   Procedure: CORONARY PRESSURE/FFR STUDY;  Surgeon: Elmira Newman PARAS, MD;  Location: [facility];  Service: Cardiovascular;  Laterality: N/A;   ESOPHAGOGASTRODUODENOSCOPY N/A 02/27/2024   Procedure: EGD (ESOPHAGOGASTRODUODENOSCOPY);  Surgeon: Elicia Claw, MD;  Location: [facility];  Service: Gastroenterology;  Laterality: N/A;   JOINT REPLACEMENT     KNEE ARTHROSCOPY Left 05/2004   thelbert 09/27/2010   RIGHT/LEFT HEART CATH AND CORONARY ANGIOGRAPHY N/A 04/23/2023   Procedure: RIGHT/LEFT HEART CATH AND CORONARY ANGIOGRAPHY;  Surgeon: Elmira Newman PARAS, MD;  Location: [facility];  Service: Cardiovascular;  Laterality: N/A;   SHOULDER ARTHROSCOPY WITH OPEN ROTATOR CUFF REPAIR Right 2009   thelbert 09/13/2010  TOTAL HIP ARTHROPLASTY Left 10/02/2017   TOTAL HIP ARTHROPLASTY Left 10/02/2017   Procedure: LEFT TOTAL HIP ARTHROPLASTY ANTERIOR APPROACH;  Surgeon: Liam Lerner, MD;  Location: [facility];  Service: Orthopedics;  Laterality: Left;   TOTAL KNEE ARTHROPLASTY  04/16/2011   Procedure: TOTAL KNEE ARTHROPLASTY;  Surgeon: Lerner JINNY Liam;  Location: [facility];  Service: Orthopedics;  Laterality: Left;  LEFT TOTAL KNEE ARTHROPLASTY    TOTAL KNEE ARTHROPLASTY Right 04/19/2014   Procedure: RIGHT TOTAL KNEE ARTHROPLASTY;  Surgeon: Lerner JINNY Liam, MD;  Location: [facility];  Service:  Orthopedics;  Laterality: Right;   Family History  Family History  Problem Relation Age of Onset   Migraines Mother    Cancer Sister    Cancer Sister    Social History  reports that he quit smoking about 49 years ago. His smoking use included cigarettes. He started smoking about 59 years ago. He has a 10 pack-year smoking history. He has never used smokeless tobacco. He reports that he does not drink alcohol and does not use drugs. Allergies No Known Allergies Home medications Prior to Admission medications   Medication Sig Start Date End Date Taking? Authorizing Provider  acetaminophen  (TYLENOL ) 650 MG CR tablet Take 1,300 mg by mouth every 8 (eight) hours as needed for pain.   Yes [provider]  amLODipine  (NORVASC ) 10 MG tablet Take 10 mg by mouth daily.   Yes [provider]  aspirin  EC (ASPIRIN  LOW DOSE) 81 MG tablet TAKE 1 TABLET (81 MG TOTAL) BY MOUTH DAILY. SWALLOW WHOLE. 09/05/23  Yes Patwardhan, Manish J, MD  atorvastatin (LIPITOR) 10 MG tablet Take 10 mg by mouth daily. 12/22/21  Yes [provider]  Azelastine HCl 137 MCG/SPRAY SOLN Place 1 spray into both nostrils every 12 (twelve) hours as needed (for allergies or rhinitis). 05/15/23  Yes [provider]  brimonidine  (ALPHAGAN ) 0.2 % ophthalmic solution Place 1 drop into the left eye 2 (two) times daily.   Yes [provider]  dorzolamide -timolol  (COSOPT ) 2-0.5 % ophthalmic solution Place 1 drop into the left eye 2 (two) times daily.   Yes [provider]  empagliflozin (JARDIANCE) 10 MG TABS tablet Take 10 mg by mouth daily.   Yes [provider]  isosorbide  mononitrate (IMDUR ) 30 MG 24 hr tablet Take 0.5 tablets (15 mg total) by mouth daily. You can take an additional 15 mg as needed for chest pain. 05/17/23 02/26/24 Yes Wyn Jackee VEAR Mickey., NP  lisinopril  (ZESTRIL ) 20 MG tablet Take 1 tablet (20 mg total) by mouth at bedtime. 05/29/23  Yes Wyn Jackee VEAR Mickey., NP   nitroGLYCERIN  (NITROSTAT ) 0.4 MG SL tablet Place 1 tablet (0.4 mg total) under the tongue every 5 (five) minutes as needed for chest pain. 04/17/23  Yes Patwardhan, Manish J, MD  pantoprazole  (PROTONIX ) 40 MG tablet Take 1 tablet (40 mg total) by mouth 2 (two) times daily for 56 days, THEN 1 tablet (40 mg total) daily for 28 days. 03/08/24 05/31/24 Yes [place], Eric J, DO  sodium bicarbonate 650 MG tablet Take 1 tablet (650 mg total) by mouth 2 (two) times daily. 03/08/24 06/06/24 Yes [place], Eric J, DO  tamsulosin  (FLOMAX ) 0.4 MG CAPS capsule Take 0.4 mg by mouth at bedtime.   Yes [provider]  latanoprost  (XALATAN ) 0.005 % ophthalmic solution Place 1 drop into the left eye at bedtime. Patient not taking: Reported on 03/27/2024    [provider]  lidocaine  (LIDODERM ) 5 % Place 1 patch  onto the skin daily as needed (for pain- Remove & Discard patch within 12 hours or as directed by MD). Patient not taking: Reported on 03/27/2024    [provider]     Vitals:   03/29/24 1615 03/29/24 1630 03/29/24 1645 03/29/24 1700  BP: 94/70 (!) 108/59 (!) 107/52 110/81  Pulse:      Resp: (!) 23 (!) 23 (!) 23 (!) 22  Temp:      TempSrc:      SpO2:      Weight:      Height:       Exam Gen alert, elderly, sitting up in bed Pleasant  Sclera anicteric, throat clear  No jvd or bruits Chest clear bilat to bases RRR no MRG Abd mod tight 2-3+ ascites, +bs, no rebound Ext 1-2+ bilat LE edema, no other edema Neuro is alert, no asterixis, nonfocal    Home bp meds: Norvasc  10 mg daily (on pause) Jardiance (paused) Imdur  (paused) Lisinopril  20mg  at bedtime (paused)    Date   Creat  eGFR (ml/min) 2009- 2015  1.19- 1.33 2019   1.29- 1.34 2024   1.31 May 2023  1.43 12/31/23  1.74  39 ml/min  10/13- 03/08/24 1.34- 3.20 19- 54 ml/min  03/19/24  2.62 03/26/24  2.68 11/14   2.74 11/15   3.49 11/16   4.17  UA 11/13 - amber, prot 30, rare bact, 0-5 rbc/ wbc/ epi     Assessment/ Plan AKI on CKD 3a: b/l creatinine is 1.3- 1.7, from aug- oct 2025, eGFR 39- 54 ml/min. Creatinine here was 2.6 on admission 11/06, in the setting of low-grade fevers and very high WBC's. BP's were stable initially then dropped into the 70-80s requiring ICU and vasopressor support. Renal function worsened along with the hypotension. UA was negative, CT abd showed normal kidneys w/o obstruction. 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 Getting rasburicase. No sig signs of uremia. UOP remains good which is encouraging. Pt is not a good RRT candidate given sig comorbidities. Fluid in is enough w/o extra IVFs today. Recommend cont supportive care w/ IV abx,  pressor support and rasburicase. Have d/w family and questions answered. Will follow.   Myer Fret  MD [facility] 03/29/2024, 5:17 PM  Recent Labs  Lab 03/26/24 1103 03/26/24 2156 03/28/24 1617 03/29/24 0430 03/29/24 1214  HGB  --    < > 7.1* 8.0*  --   ALBUMIN  --    < > 2.8* 2.9*  --   CALCIUM  --    < > 7.7* 7.5* 7.4*  PHOS 4.3  --   --   --   --   CREATININE  --    < > 4.18* 4.17* 4.20*  K  --    < > 4.6 4.4 4.4   < > = values in this interval not displayed.   Inpatient medications:  allopurinol  50 mg Oral QODAY   brimonidine   1 drop Left Eye BID   Chlorhexidine  Gluconate Cloth  6 each Topical Daily   dorzolamide -timolol   1 drop Left Eye BID   feeding supplement  1 Container Oral TID BM   latanoprost   1 drop Left Eye QHS   midodrine  10 mg Oral TID WC   pantoprazole  (PROTONIX ) IV  40 mg Intravenous Q24H   pneumococcal 20-valent conjugate vaccine  0.5 mL Intramuscular Tomorrow-1000   sodium bicarbonate  650 mg Oral TID   sodium chloride   flush  3 mL Intravenous Q12H   tamsulosin   0.4 mg Oral QHS    heparin  2,150 Units/hr (03/29/24 1714)   linezolid (ZYVOX) IV Stopped (03/29/24 0818)   norepinephrine (LEVOPHED) Adult infusion 8 mcg/min (03/29/24 1714)    piperacillin-tazobactam (ZOSYN)  IV Stopped (03/29/24 1422)   acetaminophen  **OR** acetaminophen , HYDROcodone -acetaminophen , melatonin, ondansetron  **OR** ondansetron  (ZOFRAN ) IV, mouth rinse

## 2024-03-29 NOTE — Plan of Care (Signed)
  Problem: Clinical Measurements: Goal: Diagnostic test results will improve Outcome: Progressing Goal: Respiratory complications will improve Outcome: Progressing Goal: Cardiovascular complication will be avoided Outcome: Progressing   Problem: Nutrition: Goal: Adequate nutrition will be maintained Outcome: Progressing   Problem: Elimination: Goal: Will not experience complications related to bowel motility Outcome: Progressing Goal: Will not experience complications related to urinary retention Outcome: Progressing

## 2024-03-29 NOTE — Progress Notes (Signed)
 [facility]  HEMATOLOGY/ONCOLOGY IN-PATIENT PROGRESS NOTE   PATIENT NAME: Jared [name]   MR#: [id number] DOB: 1946/01/07 CSN#: [id number]   DATE OF SERVICE: 03/29/2024  ASSESSMENT & PLAN:   78 y.o. gentleman with known history of MDS, IPSS-R score of 6 and also history of CLL, who is followed by Dr. Loretha in our clinic, was admitted from oncology clinic on 03/26/2024 after he presented with abdominal pain, with concern for SBP/ bacterial infection, in the context of ascites, leukocytosis.  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  IPSS-R score of 6 points, median overall survival of 1.6 years, poor prognosis.  Not a candidate for bone marrow transplant given his age and functional status. - Has leukemoid reaction currently, likely reactionary from infection/SBP. - He did undergo paracentesis on 03/28/2024 - Leukocytosis fluctuating.  White count was 61,000 today.  Differential was not obtained.  No overt blasts in the periphery up until 03/28/2024. - Recent flow cytometry did not show significant increase in blasts.  Not indicative of AML transformation clinically.  Left shift, suggestive of bone marrow distress - Continue to monitor blood counts daily and transfuse as needed to maintain hemoglobin above 8 and platelet count above 20,000.  Hemoglobin at 8 today. - No overt evidence of DIC.  Normal fibrinogen level.  D-dimer is not reliable in his case as it could be acute phase reactant from current acute illness and also related to bilateral DVT.  Hyperuricemia: - Uric acid was elevated at 15.7 on 03/26/2024.  Repeat value today is still elevated at 15.5.  Proceeded with 1 dose of rasburicase 6 mg. -Continue to monitor uric acid daily.  SBP/ Ascites: - Underwent IR guided paracentesis on 03/28/2024 with removal of 1.9 L of dark amber fluid.  Patient subjectively feeling better after this.  Fluid analysis results suggest picture consistent with SBP.  He is  on broad-spectrum antibiotics  Shock - Likely mixed cardiogenic shock and from sepsis.  He is on Levophed gtt. now.  Also on midodrine.  Bilateral DVT - Currently on heparin  drip.  Continue current management.  CLL - He has been on observation only for this.  Rest of the management is as per primary team.  Please call us  with any questions.  Dr. Loretha will continue following him from tomorrow, 03/30/2024.   Chinita Patten, MD 03/29/2024 1:23 PM  PERTINENT HISTORY:  MDS, IPSS-R score of 6 points, Median OS of 1.6 yrs, with 1.4 yrs to 25% AML evolution. Bone marrow transplant not feasible due to age and medical fitness. - Recommend supportive care and we will consider hypomethylating agents down the lane if he has worsening multiple cytopenias. - Labs today, consider PRBC transfusion to maintain a Hb of 8 g/dl - Order weekly EPO or darbepoietin to improve hemoglobin. - EPO levels to be drawn today, will conder ESA if Epo levels less than 200 mu/ml - Dose of EPO alpha is 40000-60000 units/week [place],  - He understands that this is not a curative treatment and we have discussed the prognosis.   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  SUBJECTIVE:   Patient seen and evaluated.  He was resting in the bed.  Feeling subjectively better after paracentesis yesterday.  Abdominal pain is better.  Wife was by the bedside.  No fevers.  Denies any new complaints.  OBJECTIVE:  Vitals:   03/29/24 1230 03/29/24 1245  BP:  (!) 107/57  Pulse:  Resp: 19 (!) 23  Temp:    SpO2:       Intake/Output Summary (Last 24 hours) at 03/29/2024 1323 Last data filed at 03/29/2024 1056 Gross per 24 hour  Intake 3206.86 ml  Output 700 ml  Net 2506.86 ml    Physical Exam Constitutional:      Appearance: Normal appearance.     Comments: Frail-appearing, elderly gentleman, in no acute distress.  HENT:     Head: Normocephalic and atraumatic.  Eyes:      Conjunctiva/sclera: Conjunctivae normal.  Cardiovascular:     Rate and Rhythm: Normal rate and regular rhythm.  Pulmonary:     Effort: Pulmonary effort is normal. No respiratory distress.  Abdominal:     General: There is distension.  Neurological:     General: No focal deficit present.     Mental Status: He is alert and oriented to person, place, and time.     LABS:   Results for orders placed or performed during the hospital encounter of 03/26/24 (from the past 24 hours)  CBC with Differential/Platelet     Status: Abnormal   Collection Time: 03/28/24  4:17 PM  Result Value Ref Range   WBC 55.9 (HH) 4.0 - 10.5 K/uL   RBC 2.38 (L) 4.22 - 5.81 MIL/uL   Hemoglobin 7.1 (L) 13.0 - 17.0 g/dL   HCT 76.7 (L) 60.9 - 47.9 %   MCV 97.5 80.0 - 100.0 fL   MCH 29.8 26.0 - 34.0 pg   MCHC 30.6 30.0 - 36.0 g/dL   RDW 69.8 (H) 88.4 - 84.4 %   Platelets 66 (L) 150 - 400 K/uL   nRBC 5.0 (H) 0.0 - 0.2 %   Neutrophils Relative % 87 %   Neutro Abs 48.7 (H) 1.7 - 7.7 K/uL   Lymphocytes Relative 3 %   Lymphs Abs 1.8 0.7 - 4.0 K/uL   Monocytes Relative 2 %   Monocytes Absolute 0.8 0.1 - 1.0 K/uL   Eosinophils Relative 0 %   Eosinophils Absolute 0.0 0.0 - 0.5 K/uL   Basophils Relative 2 %   Basophils Absolute 1.1 (H) 0.0 - 0.1 K/uL   Immature Granulocytes 6 %   Abs Immature Granulocytes 3.30 (H) 0.00 - 0.07 K/uL   Target Cells PRESENT   Lactic acid, plasma     Status: None   Collection Time: 03/28/24  4:17 PM  Result Value Ref Range   Lactic Acid, Venous 1.9 0.5 - 1.9 mmol/L  Comprehensive metabolic panel     Status: Abnormal   Collection Time: 03/28/24  4:17 PM  Result Value Ref Range   Sodium 137 135 - 145 mmol/L   Potassium 4.6 3.5 - 5.1 mmol/L   Chloride 101 98 - 111 mmol/L   CO2 18 (L) 22 - 32 mmol/L   Glucose, Bld 129 (H) 70 - 99 mg/dL   BUN 63 (H) 8 - 23 mg/dL   Creatinine, Ser 5.81 (H) 0.61 - 1.24 mg/dL   Calcium 7.7 (L) 8.9 - 10.3 mg/dL   Total Protein 5.2 (L) 6.5 - 8.1  g/dL   Albumin 2.8 (L) 3.5 - 5.0 g/dL   AST 91 (H) 15 - 41 U/L   ALT 30 0 - 44 U/L   Alkaline Phosphatase 492 (H) 38 - 126 U/L   Total Bilirubin 4.2 (H) 0.0 - 1.2 mg/dL   GFR, Estimated 14 (L) >60 mL/min   Anion gap 18 (H) 5 - 15  Pro Brain natriuretic peptide  Status: Abnormal   Collection Time: 03/28/24  4:17 PM  Result Value Ref Range   Pro Brain Natriuretic Peptide 24,567.0 (H) <300.0 pg/mL  MRSA Next Gen by PCR, Nasal     Status: None   Collection Time: 03/28/24  4:36 PM   Specimen: Nasal Mucosa; Nasal Swab  Result Value Ref Range   MRSA by PCR Next Gen NOT DETECTED NOT DETECTED  Heparin  level (unfractionated)     Status: Abnormal   Collection Time: 03/28/24  7:19 PM  Result Value Ref Range   Heparin  Unfractionated <0.10 (L) 0.30 - 0.70 IU/mL  Lactic acid, plasma     Status: None   Collection Time: 03/28/24  7:23 PM  Result Value Ref Range   Lactic Acid, Venous 1.8 0.5 - 1.9 mmol/L  Type and screen [facility]     Status: None (Preliminary result)   Collection Time: 03/28/24  7:23 PM  Result Value Ref Range   ABO/RH(D) O POS    Antibody Screen NEG    Sample Expiration 04/22/2024,2359    Unit Number [id number]    Blood Component Type RED CELLS,LR    Unit division 00    Status of Unit ISSUED    Transfusion Status OK TO TRANSFUSE    Crossmatch Result      Compatible Performed at [facility], [address] [place], [place] [address]   Prepare RBC (crossmatch)     Status: None   Collection Time: 03/28/24  7:23 PM  Result Value Ref Range   Order Confirmation      ORDER PROCESSED BY BLOOD BANK Performed at [facility], [address] [place], [place] [address]   Heparin  level (unfractionated)     Status: Abnormal   Collection Time: 03/29/24  2:39 AM  Result Value Ref Range   Heparin  Unfractionated 0.15 (L) 0.30 - 0.70 IU/mL  CBC     Status: Abnormal   Collection Time: 03/29/24  4:30 AM  Result Value  Ref Range   WBC 61.0 (HH) 4.0 - 10.5 K/uL   RBC 2.69 (L) 4.22 - 5.81 MIL/uL   Hemoglobin 8.0 (L) 13.0 - 17.0 g/dL   HCT 75.1 (L) 60.9 - 47.9 %   MCV 92.2 80.0 - 100.0 fL   MCH 29.7 26.0 - 34.0 pg   MCHC 32.3 30.0 - 36.0 g/dL   RDW 72.3 (H) 88.4 - 84.4 %   Platelets 70 (L) 150 - 400 K/uL   nRBC 3.3 (H) 0.0 - 0.2 %  Comprehensive metabolic panel     Status: Abnormal   Collection Time: 03/29/24  4:30 AM  Result Value Ref Range   Sodium 135 135 - 145 mmol/L   Potassium 4.4 3.5 - 5.1 mmol/L   Chloride 100 98 - 111 mmol/L   CO2 18 (L) 22 - 32 mmol/L   Glucose, Bld 137 (H) 70 - 99 mg/dL   BUN 67 (H) 8 - 23 mg/dL   Creatinine, Ser 5.82 (H) 0.61 - 1.24 mg/dL   Calcium 7.5 (L) 8.9 - 10.3 mg/dL   Total Protein 5.3 (L) 6.5 - 8.1 g/dL   Albumin 2.9 (L) 3.5 - 5.0 g/dL   AST 96 (H) 15 - 41 U/L   ALT 31 0 - 44 U/L   Alkaline Phosphatase 524 (H) 38 - 126 U/L   Total Bilirubin 4.8 (H) 0.0 - 1.2 mg/dL   GFR, Estimated 14 (L) >60 mL/min   Anion gap 17 (H) 5 -  15  Uric acid     Status: Abnormal   Collection Time: 03/29/24 10:53 AM  Result Value Ref Range   Uric Acid, Serum 15.5 (H) 3.7 - 8.6 mg/dL  Heparin  level (unfractionated)     Status: Abnormal   Collection Time: 03/29/24 12:14 PM  Result Value Ref Range   Heparin  Unfractionated 0.23 (L) 0.30 - 0.70 IU/mL  Basic metabolic panel     Status: Abnormal   Collection Time: 03/29/24 12:14 PM  Result Value Ref Range   Sodium 135 135 - 145 mmol/L   Potassium 4.4 3.5 - 5.1 mmol/L   Chloride 99 98 - 111 mmol/L   CO2 17 (L) 22 - 32 mmol/L   Glucose, Bld 112 (H) 70 - 99 mg/dL   BUN 68 (H) 8 - 23 mg/dL   Creatinine, Ser 5.79 (H) 0.61 - 1.24 mg/dL   Calcium 7.4 (L) 8.9 - 10.3 mg/dL   GFR, Estimated 14 (L) >60 mL/min   Anion gap 19 (H) 5 - 15  Magnesium      Status: None   Collection Time: 03/29/24 12:14 PM  Result Value Ref Range   Magnesium  2.1 1.7 - 2.4 mg/dL     IMAGING STUDIES:   US  Paracentesis Result Date: 03/28/2024 INDICATION:  Inpatient with CLL, CKD 3a with concern for peritonitis. Recent imaging demonstrates ascites. EXAM: ULTRASOUND GUIDED DIAGNOSTIC AND THERAPEUTIC PARACENTESIS MEDICATIONS: 10 mL 1% lidocaine  COMPLICATIONS: None immediate. PROCEDURE: Informed written consent was obtained from the patient after a discussion of the risks, benefits and alternatives to treatment. A timeout was performed prior to the initiation of the procedure. Initial ultrasound scanning demonstrates a moderate amount of ascites within the right lower abdominal quadrant. The right lower abdomen was prepped and draped in the usual sterile fashion. 1% lidocaine  was used for local anesthesia. Following this, a 19 gauge, 7-cm, Yueh catheter was introduced. An ultrasound image was saved for documentation purposes. The paracentesis was performed. The paracentesis was stopped prior to complete drainage of fluid due to concern for hypotension. The catheter was removed and a dressing was applied. The patient tolerated the procedure well without immediate post procedural complication. FINDINGS: A total of approximately 1.9 liters of dark amber fluid was removed. Samples were sent to the laboratory as requested by the clinical team. IMPRESSION: Successful ultrasound-guided paracentesis yielding 1.9 liters of peritoneal fluid. Performed by Laymon Coast, NP under the supervision of Dr. Luverne PLAN: If the patient eventually requires >/=2 paracenteses in a 30 day period, candidacy for formal evaluation by the [facility] Interventional Radiology Portal Hypertension [facility] will be assessed. Electronically Signed   By: Marcey Luverne M.D.   On: 03/28/2024 09:00   DG CHEST PORT 1 VIEW Result Date: 03/27/2024 EXAM: 1 VIEW(S) XRAY OF THE CHEST 03/27/2024 05:53:00 PM COMPARISON: Chest abdomen and pelvis CT yesterday at 11:26 am. Portable chest yesterday at 11:11 am. CLINICAL HISTORY: [id number] Seen in radiology department [phone] Seen in radiology department  [phone] FINDINGS: LUNGS AND PLEURA: Moderate slightly increased left layering pleural effusion is noted, small unchanged right pleural effusion. There is overlying consolidation or atelectasis in the left base. No other focal lung infiltrate is seen with low lung volumes. No pneumothorax. HEART AND MEDIASTINUM: Mild cardiomegaly. No vascular congestion is seen. The aorta is tortuous with a stable mediastinum and scattered calcific plaques. BONES AND SOFT TISSUES: Degenerative change thoracic spine with slight dextroscoliosis. IMPRESSION: 1. Moderate, slightly increased left pleural effusion with small, unchanged right pleural effusion. 2. Left basilar consolidation or  atelectasis as before. otherwise clear hypoinflated lungs . 3. Mild cardiomegaly. Electronically signed by: Francis Quam MD 03/27/2024 08:18 PM EST RP Workstation: [id number]   VAS US  LOWER EXTREMITY VENOUS (DVT) Result Date: 03/27/2024  Lower Venous DVT Study Patient Name:  FRANCES JOYNT  Date of Exam:   03/27/2024 Medical Rec #: [id number]      Accession #:    [id number] Date of Birth: 1945-08-17      Patient Gender: M Patient Age:   44 years Exam Location:  [facility] Procedure:      VAS US  LOWER EXTREMITY VENOUS (DVT) Referring Phys: ELSIE MONTCLAIR --------------------------------------------------------------------------------  Indications: D-Dimer >20. Ascites, abdominal distention.  Risk Factors: Myelodysplastic Syndromes/Chronic lymphocytic leukemia diagnosed 2025 Limitations: Edema. Positioning. Comparison Study: No prior study on file Performing Technologist: Alberta Lis RVS  Examination Guidelines: A complete evaluation includes B-mode imaging, spectral Doppler, color Doppler, and power Doppler as needed of all accessible portions of each vessel. Bilateral testing is considered an integral part of a complete examination. Limited examinations for reoccurring indications may be performed as noted. The reflux portion of the  exam is performed with the patient in reverse Trendelenburg.  +---------+---------------+---------+-----------+----------+-------------------+ RIGHT    CompressibilityPhasicitySpontaneityPropertiesThrombus Aging      +---------+---------------+---------+-----------+----------+-------------------+ CFV      Full           Yes      Yes                                      +---------+---------------+---------+-----------+----------+-------------------+ SFJ      Full                                                             +---------+---------------+---------+-----------+----------+-------------------+ FV Prox  Full           Yes      No                                       +---------+---------------+---------+-----------+----------+-------------------+ FV Mid   Full                                                             +---------+---------------+---------+-----------+----------+-------------------+ FV DistalFull                                                             +---------+---------------+---------+-----------+----------+-------------------+ PFV      Full           Yes      Yes                                      +---------+---------------+---------+-----------+----------+-------------------+ POP  Full           Yes      Yes                                      +---------+---------------+---------+-----------+----------+-------------------+ PTV      None                                         Acute               +---------+---------------+---------+-----------+----------+-------------------+ PERO                                                  Not well visualized +---------+---------------+---------+-----------+----------+-------------------+   +---------+---------------+---------+-----------+----------+-------------------+ LEFT     CompressibilityPhasicitySpontaneityPropertiesThrombus Aging       +---------+---------------+---------+-----------+----------+-------------------+ CFV      Full           Yes      Yes                                      +---------+---------------+---------+-----------+----------+-------------------+ SFJ      Full                                                             +---------+---------------+---------+-----------+----------+-------------------+ FV Prox  Full           Yes      Yes                                      +---------+---------------+---------+-----------+----------+-------------------+ FV Mid   Full                                                             +---------+---------------+---------+-----------+----------+-------------------+ FV DistalFull                                                             +---------+---------------+---------+-----------+----------+-------------------+ PFV      Full           Yes      Yes                                      +---------+---------------+---------+-----------+----------+-------------------+ POP                     Yes      Yes  patent by color and                                                       Doppler             +---------+---------------+---------+-----------+----------+-------------------+ PTV      None                                         Acute               +---------+---------------+---------+-----------+----------+-------------------+ PERO     None                                         Acute               +---------+---------------+---------+-----------+----------+-------------------+     Summary: RIGHT: - Findings consistent with acute deep vein thrombosis involving the right posterior tibial veins.  Significant interstitial edema noted throughout the extremity  LEFT: - Findings consistent with acute deep vein thrombosis involving the left posterior tibial veins, and left peroneal veins.  Significant  interstitial edema noted throughout the extremity.  *See table(s) above for measurements and observations. Electronically signed by Penne Colorado MD on 03/27/2024 at 6:03:26 PM.    Final    NM Pulmonary Perfusion Result Date: 03/27/2024 CLINICAL DATA:  Pulmonary embolism (PE) suspected, high prob EXAM: NUCLEAR MEDICINE PERFUSION LUNG SCAN TECHNIQUE: Perfusion images were obtained in multiple projections after intravenous injection of radiopharmaceutical. No ventilation imaging performed. RADIOPHARMACEUTICALS:  4.18 mCi Tc-66m MAA IV COMPARISON:  Chest radiographs and chest CT 03/26/2024. FINDINGS: There are no wedge-shaped perfusion defects to suggest acute pulmonary embolism. Left pleural effusion noted, similar to recent prior studies. IMPRESSION: No evidence of acute pulmonary embolism on perfusion scintigraphy by PISAPED criteria. Grossly stable small left pleural effusion. Electronically Signed   By: Elsie Perone M.D.   On: 03/27/2024 16:57   US  Abdomen Limited RUQ (LIVER/GB) Result Date: 03/26/2024 CLINICAL DATA:  Abdominal pain. EXAM: ULTRASOUND ABDOMEN LIMITED RIGHT UPPER QUADRANT COMPARISON:  CT dated 03/26/2024. FINDINGS: Gallbladder: There is stone and layering sludge within the gallbladder. The gallbladder wall is mildly thickened measuring 5 mm in thickness, possibly related to ascites. Negative sonographic Murphy's sign. Common bile duct: Diameter: 8 mm Liver: The liver demonstrates a normal echogenicity. There is apparent minimal irregularity of the liver contour which may represent changes of cirrhosis. Clinical correlation recommended. Portal vein is patent on color Doppler imaging with normal direction of blood flow towards the liver. Other: Moderate ascites noted. IMPRESSION: 1. Gallbladder stone and sludge. No sonographic evidence of acute cholecystitis. 2. Moderate ascites. 3. Questionable cirrhosis.  Clinical correlation is recommended. Electronically Signed   By: Vanetta Chou  M.D.   On: 03/26/2024 14:03   CT CHEST ABDOMEN PELVIS WO CONTRAST Result Date: 03/26/2024 EXAM: CT CHEST, ABDOMEN AND PELVIS WITHOUT CONTRAST 03/26/2024 11:41:11 AM TECHNIQUE: CT of the chest, abdomen and pelvis was performed without the administration of intravenous contrast. Multiplanar reformatted images are provided for review. Automated exposure control, iterative reconstruction, and/or weight based adjustment of the mA/kV was utilized to reduce the radiation dose to as  low as reasonably achievable. COMPARISON: CT of the abdomen and pelvis dated 03/01/2024. CLINICAL HISTORY: History of hemorrhagic gastritis. Distended abdomen. Likely ascites. Increased bilirubin level. FINDINGS: CHEST: MEDIASTINUM AND LYMPH NODES: There is moderate calcific coronary artery disease present. Pericardium is unremarkable. The central airways are clear. No mediastinal, hilar or axillary lymphadenopathy. There is moderate calcific atheromatous disease within the thoracic aorta. The esophagus is unremarkable. LUNGS AND PLEURA: There is a small patchy, reticular area of opacification/consolidation present anteriorly within the right upper lobe underlying the right first costosternal joint. There are patchy, streaky bronchovascular opacities present within the lung bases bilaterally. There is improved aeration of the lower lobes relative to the previous study. The left-sided pleural effusion has mildly worsened in the interim. No pneumothorax. ABDOMEN AND PELVIS: LIVER: The liver is unremarkable. GALLBLADDER AND BILE DUCTS: There is radiopaque material laying independently within the gallbladder. No biliary ductal dilatation. SPLEEN: The spleen is enlarged. PANCREAS: No acute abnormality. ADRENAL GLANDS: No acute abnormality. KIDNEYS, URETERS AND BLADDER: No stones in the kidneys or ureters. No hydronephrosis. No perinephric or periureteral stranding. Urinary bladder is unremarkable. GI AND BOWEL: Stomach demonstrates no acute  abnormality. There is no bowel obstruction. REPRODUCTIVE ORGANS: No acute abnormality. PERITONEUM AND RETROPERITONEUM: There is moderate ascites present. No free air. VASCULATURE: Aorta is normal in caliber. ABDOMINAL AND PELVIS LYMPH NODES: No lymphadenopathy. BONES AND SOFT TISSUES: The patient is status post left total hip arthroplasty. There are advanced degenerative changes within the lower lumbar spine. The patient is status post decompression laminectomies. No acute osseous abnormality. No focal soft tissue abnormality. IMPRESSION: 1. Small patchy reticular opacification/consolidation in the right upper lobe and patchy streaky bronchovascular opacities at the lung bases bilaterally; aeration of the lower lobes has improved compared to 03/01/24 2. Mildly worsened left pleural effusion 3. Moderate ascites 4. Splenomegaly 5. Radiopaque material within the gallbladder, compatible with gallstones 6. Moderate calcific coronary artery disease and moderate calcific atheromatous disease of the thoracic aorta Electronically signed by: Evalene Coho MD 03/26/2024 12:18 PM EST RP Workstation: [id number]   DG Chest Port 1 View Result Date: 03/26/2024 CLINICAL DATA:  Possible sepsis. EXAM: PORTABLE CHEST 1 VIEW COMPARISON:  09/20/2017 FINDINGS: Lungs are hypoinflated with bibasilar opacification left worse than right likely small layering left effusion with bibasilar atelectasis. Infection in the lung bases is possible. Cardiomediastinal silhouette and remainder of the exam is unchanged. IMPRESSION: Hypoinflation with bibasilar opacification left worse than right likely small layering left effusion with bibasilar atelectasis. Infection in the lung bases is possible. Electronically Signed   By: Toribio Agreste M.D.   On: 03/26/2024 11:42   DG Abd 1 View Result Date: 03/04/2024 CLINICAL DATA:  Abdominal distension. EXAM: ABDOMEN - 1 VIEW COMPARISON:  Radiograph dated 02/27/2024. FINDINGS: No bowel dilatation or  evidence of obstruction. Air and contrast noted in the colon. Degenerative changes spine. Total left hip arthroplasty. No acute osseous pathology. IMPRESSION: Nonobstructive bowel gas pattern. Electronically Signed   By: Vanetta Chou M.D.   On: 03/04/2024 18:35   US  RENAL Result Date: 03/03/2024 CLINICAL DATA:  Renal failure. EXAM: RENAL / URINARY TRACT ULTRASOUND COMPLETE COMPARISON:  None Available. FINDINGS: Right Kidney: Renal measurements: 10.0 cm x 5.3 cm x 5.2 cm = volume: 142.6 mL. There is diffusely increased echogenicity of the renal parenchyma. A 2.3 cm x 2.1 cm x 1.6 cm simple right renal cyst is seen within the mid right kidney. No hydronephrosis is visualized. Left Kidney: Renal measurements: 10.3 cm x 4.6 cm  x 5.1 cm = volume: 125.1 mL. There is diffusely increased echogenicity of the renal parenchyma. A 1.9 cm x 1.7 cm x 1.6 cm simple left renal cyst is seen with the mid to lower left kidney. No hydronephrosis is visualized. Bladder: Appears normal for degree of bladder distention. The bilateral ureteral jets are not visualized peer Other: There is a moderate amount of ascites. IMPRESSION: 1. Bilateral echogenic kidneys which likely represents sequelae associated with medical renal disease. 2. Bilateral simple renal cysts. 3. Ascites. Electronically Signed   By: Suzen Dials M.D.   On: 03/03/2024 18:03   CT ABDOMEN PELVIS WO CONTRAST Result Date: 03/01/2024 CLINICAL DATA:  Abdominal distension. EXAM: CT ABDOMEN AND PELVIS WITHOUT CONTRAST TECHNIQUE: Multidetector CT imaging of the abdomen and pelvis was performed following the standard protocol without IV contrast. RADIATION DOSE REDUCTION: This exam was performed according to the departmental dose-optimization program which includes automated exposure control, adjustment of the mA and/or kV according to patient size and/or use of iterative reconstruction technique. COMPARISON:  CT abdomen pelvis dated 02/24/2024. FINDINGS:  Evaluation of this exam is limited in the absence of intravenous contrast as well as due to respiratory motion. Evaluation is also limited due to anasarca and streak artifact caused by left hip arthroplasty. Lower chest: Small bilateral pleural effusions. Large areas of consolidative changes involving the lower lobes bilaterally may represent atelectasis or pneumonia and progressed since the prior CT. No intra-abdominal free air. Diffuse mesenteric edema and small ascites. Hepatobiliary: The liver is grossly unremarkable. No biliary dilatation. Gallstone. Vicarious excretion of contrast also noted in the gallbladder. Pancreas: Unremarkable. No pancreatic ductal dilatation or surrounding inflammatory changes. Spleen: Normal in size without focal abnormality. Adrenals/Urinary Tract: The adrenal glands unremarkable. There is no hydronephrosis or nephrolithiasis on either side. Bilateral perinephric stranding, nonspecific. The urinary bladder is suboptimally evaluated due to streak artifact caused by left hip arthroplasty. Stomach/Bowel: Oral contrast noted throughout the bowel and colon. There is sigmoid diverticulosis. No bowel obstruction. Normal appendix. Vascular/Lymphatic: Moderate aortoiliac atherosclerotic disease. The IVC is grossly unremarkable. No portal venous gas. No obvious adenopathy. Reproductive: The prostate gland is poorly visualized. Other: Diffuse mesenteric and subcutaneous edema and anasarca, worsened since the prior CT. Musculoskeletal: Osteopenia with degenerative changes. Total left hip arthroplasty. No acute osseous pathology. IMPRESSION: 1. No bowel obstruction. Normal appendix. 2. Sigmoid diverticulosis. 3. Cholelithiasis. 4. Small bilateral pleural effusions with large areas of consolidative changes involving the lower lobes bilaterally may represent atelectasis or pneumonia and progressed since the prior CT. 5. Anasarca, worsened since the prior CT. 6.  Aortic Atherosclerosis  (ICD10-I70.0). Electronically Signed   By: Vanetta Chou M.D.   On: 03/01/2024 15:51

## 2024-03-29 NOTE — Progress Notes (Signed)
 PHARMACY - ANTICOAGULATION CONSULT NOTE  Pharmacy Consult for IV heparin  Indication: DVT  No Known Allergies  Patient Measurements: Height: 5' 11 (180.3 cm) Weight: 74.9 kg (165 lb 2 oz) IBW/kg (Calculated) : 75.3 HEPARIN  DW (KG): 77.4  Vital Signs: Temp: 98.4 F (36.9 C) (11/16 0119) Temp Source: Oral (11/16 0119) BP: 104/53 (11/16 0315) Pulse Rate: 104 (11/16 0119)  Labs: Recent Labs    03/26/24 1044 03/26/24 1047 03/26/24 1456 03/26/24 2156 03/27/24 0409 03/27/24 0409 03/28/24 0225 03/28/24 1120 03/28/24 1617 03/28/24 1919 03/29/24 0239  HGB 9.6*   < >  --    < > 9.2*  --  8.0*  --  7.1*  --   --   HCT 31.4*   < >  --    < > 29.8*  --  26.0*  --  23.2*  --   --   PLT 115*  --   --   --  108*  --  79*  --  66*  --   --   APTT  --   --  40*  --   --   --   --   --   --   --   --   LABPROT 15.5*  --   --   --   --   --   --   --   --   --   --   INR 1.2  --   --   --   --   --   --   --   --   --   --   HEPARINUNFRC  --   --   --   --   --    < > <0.10* 0.11*  --  <0.10* 0.15*  CREATININE 2.84*   < >  --   --  2.74*  --  3.49*  --  4.18*  --   --    < > = values in this interval not displayed.    Estimated Creatinine Clearance: 14.9 mL/min (A) (by C-G formula based on SCr of 4.18 mg/dL (H)).   Medical History: Past Medical History:  Diagnosis Date   Arthritis    knees and back   Enlarged prostate    takes Flomax    Headache    Hypertension    stress test done about 12 yrs ago - results wnl per pt.    Medications:  Scheduled:   brimonidine   1 drop Left Eye BID   Chlorhexidine  Gluconate Cloth  6 each Topical Daily   dorzolamide -timolol   1 drop Left Eye BID   feeding supplement  1 Container Oral TID BM   latanoprost   1 drop Left Eye QHS   midodrine  10 mg Oral TID WC   pantoprazole  (PROTONIX ) IV  40 mg Intravenous Q24H   pneumococcal 20-valent conjugate vaccine  0.5 mL Intramuscular Tomorrow-1000   sodium bicarbonate  650 mg Oral TID   sodium  chloride flush  3 mL Intravenous Q12H   tamsulosin   0.4 mg Oral QHS   Infusions:   sodium chloride  Stopped (03/28/24 1640)   heparin  1,750 Units/hr (03/29/24 0311)   linezolid (ZYVOX) IV Stopped (03/28/24 1913)   norepinephrine (LEVOPHED) Adult infusion 6 mcg/min (03/29/24 0311)   piperacillin-tazobactam (ZOSYN)  IV Stopped (03/28/24 2026)   PRN: acetaminophen  **OR** acetaminophen , HYDROcodone -acetaminophen , melatonin, ondansetron  **OR** ondansetron  (ZOFRAN ) IV  Assessment: Patient sent from cancer center for elevated WBC found to have new bilateral DVTs to start IV  heparin .  03/29/2024 -Heparin  level 0.15, remains subtherapeutic on 1750 units/hr -No complications of therapy noted per discussion with RN  Goal of Therapy:  Heparin  level 0.3-0.7 units/ml Monitor platelets by anticoagulation protocol: Yes   Plan:  Small heparin  bolus 1000 units given paracentesis yesterday morning Increase heparin  infusion to 1950 units/hr Check heparin  level in 8 hours Daily CBC - monitor closely given trend downward   Leeroy Mace RPh 03/29/2024, 3:55 AM

## 2024-03-29 NOTE — Progress Notes (Addendum)
 NAME:  Jared [name], MRN:  [id number], DOB:  03-22-1946, LOS: 3 ADMISSION DATE:  03/26/2024, CONSULTATION DATE:  @TODAY @ REFERRING MD:  Dr. Lue, CHIEF COMPLAINT:  Shock   History of Present Illness:  Mr. 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  HPI: presented to ED 02/24/24 sent from heme-onc clinic for an elevated white count and neutrophils concerning for infection.  Patient appears dehydrated, has scleral icterus, abdominal distention consistent with ascites, abdomen is not taut but soft patient does have pressure on exam but no guarding organomegaly or tenderness.   ED Course:  Initial CBC shows white count of 55.3 hemoglobin of 9.4 platelets of 113. Initial CMP shows BUN of 51 CKD with a creatinine of 2.68 alk phos of 397, albumin of 3.0, AST of 79, total protein of 5.2, total bili of 5.5, LDH of 1113. Liver/biliary ultrasound ordered showing questionable cirrhosis and moderate ascites. CT C/A/P shows bibasilar consolidations, massive ascites in abdomen, splenomegaly.  Hospital Course: Underwent US  duplex LE and was found to have DVT in left posterior tibial vein and left peroneal vein V/Q scan negative for VTE Started on broad spectrum antimicrobials including Vancomycin and Zosyn De-escalated to Ceftraixone on 11/15 Underwent IR paracentesis and drained 1.9 L ascitic fluid, BP started to deteriorate after that Given albumin and midodrine for hypotension Rapid response called on 11/15 due to hypotension, brought to ICU for pressor support.  Pertinent  Medical History   Past Medical History:  Diagnosis Date   Arthritis    knees and  back   Enlarged prostate    takes Flomax    Headache    Hypertension    stress test done about 12 yrs ago - results wnl per pt.   Significant Hospital Events: Including procedures, antibiotic start and stop dates in addition to other pertinent events   03/28/24 --> brought down to ICU for shock, started on levophed. 03/29/24 --> remains on levophed  Interim History / Subjective:  Patient reports feeling improved overall. Still complaining of abdominal distention.  Objective    Blood pressure (!) 106/55, pulse (!) 104, temperature 98.4 F (36.9 C), temperature source Oral, resp. rate 20, height 5' 11 (1.803 m), weight 78.2 kg, SpO2 98%.        Intake/Output Summary (Last 24 hours) at 03/29/2024 1006 Last data filed at 03/29/2024 0850 Gross per 24 hour  Intake 3127.43 ml  Output 500 ml  Net 2627.43 ml   Filed Weights   03/27/24 0424 03/28/24 0500 03/29/24 0500  Weight: 77.4 kg 74.9 kg 78.2 kg   Examination: General: thin, frail, no distress HEENT: PERRL, pale conjunctivae Lungs: clear breath sounds Cardiovascular: JVD 14 cm H2O, RRR, no murmurs Abdomen: abdomen more distended and tight today, no tenderness, ascites + Extremities: 1+ pitting edema bilaterally Neuro: alert, oriented x 3 GU: Deferred  Resolved problem list   Assessment and Plan   #Shock: likely mixed cardiogenic shock due to RV failure and septic due to peritonitis - Levophed gtt - Target MAP > 65 mmHg - s/p albumin - Midodrine 10 mg TID - Bumex 2 mg IVP x 1, if does not respond, will trial 4 mg - FU echo results  #  Peritonitis: peritoneal fluid 4474, 93% PMNs, stain with PMNs, no organisms seen - Continue Linezolid and Zosyn - FU blood and peritoneal cultures  #Stress Ulcer PPx: PPI  #Nutrition: regular diet  #Hyperuricemia: Uric acid 15.7 - Allopurinol 50 mg daily - Discuss with pharmacy and oncology re prescribing Rasburicase  #AKI on CKD stage III: likely cardiorenal due to RV  failure - Diuresis plan as above - Consult nephrology for dialysis candidacy - suspect patient is not a good dialysis candidate - Renally adjust medications - Avoid Nephrotoxins - Strict I/Os - Foley catheter  #High anion gap metabolic acidosis: likely due to renal failure. - Bicarb tabs  #DVT: - Heparin  drip  #MDS #CLL #Chronic Anemia - Appreciate hematology consult - Transfuse for Hgb < 7 mg/dL  Disposition: ICU appropriate for shock state requiring vasopressors  Labs   CBC: Recent Labs  Lab 03/26/24 0820 03/26/24 1044 03/26/24 1047 03/26/24 2156 03/27/24 0409 03/28/24 0225 03/28/24 1617 03/29/24 0430  WBC 55.3* 54.5*  --   --  49.8* 62.6* 55.9* 61.0*  NEUTROABS 49.8* 51.2*  --   --   --  53.3* 48.7*  --   HGB 9.4* 9.6*   < > 9.0* 9.2* 8.0* 7.1* 8.0*  HCT 29.4* 31.4*   < > 28.7* 29.8* 26.0* 23.2* 24.8*  MCV 92.7 97.8  --   --  97.1 95.9 97.5 92.2  PLT 113* 115*  --   --  108* 79* 66* 70*   < > = values in this interval not displayed.    Basic Metabolic Panel: Recent Labs  Lab 03/26/24 1044 03/26/24 1047 03/26/24 1103 03/27/24 0409 03/28/24 0225 03/28/24 1617 03/29/24 0430  NA 138 136  --  139 140 137 135  K 4.3 4.1  --  4.0 4.2 4.6 4.4  CL 100 103  --  102 103 101 100  CO2 21*  --   --  19* 20* 18* 18*  GLUCOSE 87 90  --  92 101* 129* 137*  BUN 52* 49*  --  47* 56* 63* 67*  CREATININE 2.84* 3.10*  --  2.74* 3.49* 4.18* 4.17*  CALCIUM 8.7*  --   --  8.5* 8.1* 7.7* 7.5*  MG  --   --   --  2.2  --   --   --   PHOS  --   --  4.3  --   --   --   --    GFR: Estimated Creatinine Clearance: 15 mL/min (A) (by C-G formula based on SCr of 4.17 mg/dL (H)). Recent Labs  Lab 03/26/24 1046 03/26/24 1351 03/27/24 0409 03/28/24 0225 03/28/24 1617 03/28/24 1923 03/29/24 0430  PROCALCITON  --   --  3.13  --   --   --   --   WBC  --   --  49.8* 62.6* 55.9*  --  61.0*  LATICACIDVEN 1.3 1.4  --   --  1.9 1.8  --     Liver Function Tests: Recent Labs  Lab  03/26/24 0909 03/26/24 1044 03/27/24 0409 03/27/24 1513 03/28/24 1120 03/28/24 1617 03/29/24 0430  AST 79* 108* 96*  --   --  91* 96*  ALT 32 42 33  --   --  30 31  ALKPHOS 397* 516* 465*  --   --  492* 524*  BILITOT 5.5* 4.9* 4.3*  --   --  4.2* 4.8*  PROT 5.2* 5.9* 5.4*  --   --  5.2* 5.3*  ALBUMIN 3.0* 3.3* 3.0* 2.9* 2.7* 2.8* 2.9*   Recent Labs  Lab 03/26/24 1044  LIPASE 77*   Recent Labs  Lab 03/26/24 1044  AMMONIA 25    ABG    Component Value Date/Time   PHART 7.378 04/23/2023 0955   PCO2ART 35.5 04/23/2023 0955   PO2ART 78 (L) 04/23/2023 0955   HCO3 23.1 04/23/2023 0955   HCO3 20.9 04/23/2023 0955   TCO2 21 (L) 03/26/2024 1047   ACIDBASEDEF 2.0 04/23/2023 0955   ACIDBASEDEF 4.0 (H) 04/23/2023 0955   O2SAT 68 04/23/2023 0955   O2SAT 95 04/23/2023 0955     Coagulation Profile: Recent Labs  Lab 03/26/24 1044  INR 1.2    Cardiac Enzymes: No results for input(s): CKTOTAL, CKMB, CKMBINDEX, TROPONINI in the last 168 hours.  HbA1C: No results found for: HGBA1C  CBG: No results for input(s): GLUCAP in the last 168 hours.  Review of Systems:   Not obtained  Past Medical History:  He,  has a past medical history of Arthritis, Enlarged prostate, Headache, and Hypertension.   Surgical History:   Past Surgical History:  Procedure Laterality Date   BACK SURGERY  2002   CORONARY PRESSURE/FFR STUDY N/A 04/23/2023   Procedure: CORONARY PRESSURE/FFR STUDY;  Surgeon: Elmira Newman PARAS, MD;  Location: [facility];  Service: Cardiovascular;  Laterality: N/A;   ESOPHAGOGASTRODUODENOSCOPY N/A 02/27/2024   Procedure: EGD (ESOPHAGOGASTRODUODENOSCOPY);  Surgeon: Elicia Claw, MD;  Location: [facility];  Service: Gastroenterology;  Laterality: N/A;   JOINT REPLACEMENT     KNEE ARTHROSCOPY Left 05/2004   thelbert 09/27/2010   RIGHT/LEFT HEART CATH AND CORONARY ANGIOGRAPHY N/A 04/23/2023   Procedure: RIGHT/LEFT HEART CATH AND CORONARY  ANGIOGRAPHY;  Surgeon: Elmira Newman PARAS, MD;  Location: [facility];  Service: Cardiovascular;  Laterality: N/A;   SHOULDER ARTHROSCOPY WITH OPEN ROTATOR CUFF REPAIR Right 2009   /notes 09/13/2010   TOTAL HIP ARTHROPLASTY Left 10/02/2017   TOTAL HIP ARTHROPLASTY Left 10/02/2017   Procedure: LEFT TOTAL HIP ARTHROPLASTY ANTERIOR APPROACH;  Surgeon: Liam Lerner, MD;  Location: [facility];  Service: Orthopedics;  Laterality: Left;   TOTAL KNEE ARTHROPLASTY  04/16/2011   Procedure: TOTAL KNEE ARTHROPLASTY;  Surgeon: Lerner PARAS Liam;  Location: [facility];  Service: Orthopedics;  Laterality: Left;  LEFT TOTAL KNEE ARTHROPLASTY    TOTAL KNEE ARTHROPLASTY Right 04/19/2014   Procedure: RIGHT TOTAL KNEE ARTHROPLASTY;  Surgeon: Lerner PARAS Liam, MD;  Location: [facility];  Service: Orthopedics;  Laterality: Right;     Social History:   reports that he quit smoking about 49 years ago. His smoking use included cigarettes. He started smoking about 59 years ago. He has a 10 pack-year smoking history. He has never used smokeless tobacco. He reports that he does not drink alcohol and does not use drugs.   Family History:  His family history includes Cancer in his sister and sister; Migraines in his mother.   Allergies No Known Allergies   Home Medications  Prior to Admission medications   Medication Sig Start Date End Date Taking? Authorizing Provider  acetaminophen  (TYLENOL ) 650 MG CR tablet Take 1,300 mg by mouth every 8 (eight) hours as needed for pain.   Yes [provider]  amLODipine  (NORVASC ) 10 MG tablet Take 10 mg by mouth daily.   Yes [provider]  aspirin  EC (ASPIRIN  LOW DOSE) 81 MG tablet TAKE 1 TABLET (81 MG TOTAL) BY MOUTH DAILY. SWALLOW WHOLE. 09/05/23  Yes Patwardhan, Newman PARAS, MD  atorvastatin (LIPITOR) 10 MG tablet Take 10 mg by mouth daily. 12/22/21  Yes [provider]  Azelastine HCl 137 MCG/SPRAY SOLN Place 1 spray into both nostrils every 12 (twelve) hours as needed  (for allergies or rhinitis). 05/15/23  Yes [provider]  brimonidine  (ALPHAGAN ) 0.2 % ophthalmic solution Place 1 drop into the left eye 2 (two) times daily.   Yes [provider]  dorzolamide -timolol  (COSOPT ) 2-0.5 % ophthalmic solution Place 1 drop into the left eye 2 (two) times daily.   Yes [provider]  empagliflozin (JARDIANCE) 10 MG TABS tablet Take 10 mg by mouth daily.   Yes [provider]  isosorbide  mononitrate (IMDUR ) 30 MG 24 hr tablet Take 0.5 tablets (15 mg total) by mouth daily. You can take an additional 15 mg as needed for chest pain. 05/17/23 02/26/24 Yes Wyn Jackee VEAR Mickey., NP  lisinopril  (ZESTRIL ) 20 MG tablet Take 1 tablet (20 mg total) by mouth at bedtime. 05/29/23  Yes Wyn Jackee VEAR Mickey., NP  nitroGLYCERIN  (NITROSTAT ) 0.4 MG SL tablet Place 1 tablet (0.4 mg total) under the tongue every 5 (five) minutes as needed for chest pain. 04/17/23  Yes Patwardhan, Manish J, MD  pantoprazole  (PROTONIX ) 40 MG tablet Take 1 tablet (40 mg total) by mouth 2 (two) times daily for 56 days, THEN 1 tablet (40 mg total) daily for 28 days. 03/08/24 05/31/24 Yes [place], Eric J, DO  sodium bicarbonate 650 MG tablet Take 1 tablet (650 mg total) by mouth 2 (two) times daily. 03/08/24 06/06/24 Yes [place], Eric J, DO  tamsulosin  (FLOMAX ) 0.4 MG CAPS capsule Take 0.4 mg by mouth at bedtime.   Yes [provider]  latanoprost  (XALATAN ) 0.005 % ophthalmic solution Place 1 drop into the left eye at bedtime. Patient not taking: Reported on 03/27/2024    [provider]  lidocaine  (LIDODERM ) 5 % Place 1 patch onto the skin daily as needed (for pain- Remove & Discard patch within 12 hours or as directed by MD). Patient not taking: Reported on 03/27/2024    [provider]     Due to a high probability of clinically significant, life threatening deterioration, the patient required my highest level of preparedness to intervene emergently and I  personally spent this critical care time directly and personally managing the patient. This critical care time included obtaining a history; examining the patient; pulse oximetry; ordering and review of studies; arranging urgent treatment with development of a management plan; evaluation of patient's response to treatment; frequent reassessment; and, discussions with other providers.  This critical care time was performed to assess and manage the high probability of imminent, life-threatening deterioration that could result in multi-organ failure. It was exclusive of separately billable procedures and treating other patients and teaching time. Critical Care Time: 40 minutes.  Paula Southerly, MD [facility]

## 2024-03-29 NOTE — Progress Notes (Addendum)
 eLink Physician-Brief Progress Note Patient Name: Jared [name] DOB: 1945-12-23 MRN: [id number]   Date of Service  03/29/2024  HPI/Events of Note  Insomnia  eICU Interventions  Melatonin PRN   0518 -asking for additional meds for insomnia.  Given the timing, recommend nonpharmacological interventions.  Intervention Category Minor Interventions: Routine modifications to care plan (e.g. PRN medications for pain, fever)  Jaylise Peek 03/29/2024, 1:35 AM

## 2024-03-30 ENCOUNTER — Other Ambulatory Visit: Payer: Self-pay

## 2024-03-30 ENCOUNTER — Ambulatory Visit: Admitting: Cardiology

## 2024-03-30 DIAGNOSIS — D649 Anemia, unspecified: Secondary | ICD-10-CM

## 2024-03-30 DIAGNOSIS — A419 Sepsis, unspecified organism: Principal | ICD-10-CM

## 2024-03-30 DIAGNOSIS — R6521 Severe sepsis with septic shock: Secondary | ICD-10-CM

## 2024-03-30 DIAGNOSIS — J189 Pneumonia, unspecified organism: Secondary | ICD-10-CM

## 2024-03-30 DIAGNOSIS — D469 Myelodysplastic syndrome, unspecified: Secondary | ICD-10-CM | POA: Diagnosis not present

## 2024-03-30 DIAGNOSIS — I50810 Right heart failure, unspecified: Secondary | ICD-10-CM

## 2024-03-30 DIAGNOSIS — N1832 Chronic kidney disease, stage 3b: Secondary | ICD-10-CM

## 2024-03-30 DIAGNOSIS — L8990 Pressure ulcer of unspecified site, unspecified stage: Secondary | ICD-10-CM | POA: Insufficient documentation

## 2024-03-30 LAB — BASIC METABOLIC PANEL WITH GFR
Anion gap: 21 — ABNORMAL HIGH (ref 5–15)
BUN: 70 mg/dL — ABNORMAL HIGH (ref 8–23)
CO2: 17 mmol/L — ABNORMAL LOW (ref 22–32)
Calcium: 7.2 mg/dL — ABNORMAL LOW (ref 8.9–10.3)
Chloride: 97 mmol/L — ABNORMAL LOW (ref 98–111)
Creatinine, Ser: 4.48 mg/dL — ABNORMAL HIGH (ref 0.61–1.24)
GFR, Estimated: 13 mL/min — ABNORMAL LOW (ref >60)
Glucose, Bld: 97 mg/dL (ref 70–99)
Potassium: 4.5 mmol/L (ref 3.5–5.1)
Sodium: 134 mmol/L — ABNORMAL LOW (ref 135–145)

## 2024-03-30 LAB — TYPE AND SCREEN
ABO/RH(D): O POS
Antibody Screen: NEGATIVE
Unit division: 0

## 2024-03-30 LAB — BPAM RBC
Blood Product Expiration Date: 202512162359
ISSUE DATE / TIME: 202511152309
Unit Type and Rh: 5100

## 2024-03-30 LAB — CBC WITH DIFFERENTIAL/PLATELET
Abs Immature Granulocytes: 5.3 K/uL — ABNORMAL HIGH (ref 0.00–0.07)
Basophils Absolute: 1.8 K/uL — ABNORMAL HIGH (ref 0.0–0.1)
Basophils Relative: 3 %
Eosinophils Absolute: 0 K/uL (ref 0.0–0.5)
Eosinophils Relative: 0 %
HCT: 24.1 % — ABNORMAL LOW (ref 39.0–52.0)
Hemoglobin: 7.5 g/dL — ABNORMAL LOW (ref 13.0–17.0)
Immature Granulocytes: 10 %
Lymphocytes Relative: 5 %
Lymphs Abs: 3 K/uL (ref 0.7–4.0)
MCH: 29.5 pg (ref 26.0–34.0)
MCHC: 31.1 g/dL (ref 30.0–36.0)
MCV: 94.9 fL (ref 80.0–100.0)
Monocytes Absolute: 0.8 K/uL (ref 0.1–1.0)
Monocytes Relative: 1 %
Neutro Abs: 44.8 K/uL — ABNORMAL HIGH (ref 1.7–7.7)
Neutrophils Relative %: 81 %
Platelets: 86 K/uL — ABNORMAL LOW (ref 150–400)
RBC: 2.54 MIL/uL — ABNORMAL LOW (ref 4.22–5.81)
RDW: 27.6 % — ABNORMAL HIGH (ref 11.5–15.5)
WBC: 55.6 K/uL (ref 4.0–10.5)
nRBC: 3.2 % — ABNORMAL HIGH (ref 0.0–0.2)

## 2024-03-30 LAB — HEPARIN LEVEL (UNFRACTIONATED)
Heparin Unfractionated: 0.42 [IU]/mL (ref 0.30–0.70)
Heparin Unfractionated: 0.48 [IU]/mL (ref 0.30–0.70)

## 2024-03-30 LAB — RASBURICASE - URIC ACID: Uric Acid, Serum: 8 mg/dL (ref 3.7–8.6)

## 2024-03-30 MED ORDER — SODIUM CHLORIDE 0.9% FLUSH
10.0000 mL | Freq: Two times a day (BID) | INTRAVENOUS | Status: DC
  Administered 2024-03-30: 10 mL

## 2024-03-30 MED ORDER — SODIUM CHLORIDE 0.9% FLUSH: 10.0000 mL | INTRAVENOUS | Status: DC | PRN

## 2024-03-30 MED ORDER — MIDODRINE HCL 5 MG PO TABS
15.0000 mg | ORAL_TABLET | Freq: Three times a day (TID) | ORAL | Status: DC
  Administered 2024-03-30 (×3): 15 mg via ORAL
  Filled 2024-03-30 (×3): qty 3

## 2024-03-30 MED ORDER — HYDROCORTISONE SOD SUC (PF) 100 MG IJ SOLR
50.0000 mg | Freq: Four times a day (QID) | INTRAMUSCULAR | Status: DC
  Administered 2024-03-30 – 2024-03-31 (×4): 50 mg via INTRAVENOUS
  Filled 2024-03-30 (×4): qty 2

## 2024-03-30 NOTE — Progress Notes (Addendum)
 Jared [name]   DOB:[birth year]   FM#:[id number]      ASSESSMENT & PLAN:  Jared [name] is an 78 year old male patient with oncologic history significant for MDS-CLL.  Patient admitted 03/26/2024 from oncology clinic with complaints of abdominal pain.  Medical oncology following.   MDS - Recent diagnosis 2025.  IPSS-R score of 6 points, median overall survival of 1.6 years, poor prognosis. Not a candidate for bone marrow transplant given his age and functional status.  - Flow cytometry done showed small residual B-cell, no leukemia and no increase in circulating blasts.   - Has been on weekly ESA/Retacrit, not given on 03/26/2024. -- Status post paracentesis 11/15 - Appreciate palliative team. - Medical oncology/Dr. Loretha following.   Chronic lymphocytic leukemia (CLL) Leukocytosis Leukemoid reaction - Not on oncologic treatment.  Observation only. - WBC 55.6 today.   - Status post IV Vanco x 1. Continue IV antibiotics as ordered. - Continue to monitor CBC with differential.  Hyperuricemia AKI -- Uric acid elevated 15.5 -- status post rasburicas x1 dose - Elevated creatinine 4.20 and BUN 68.  Nephrology was consulted. -- Continue to monitor uric acid and renal function   Abdominal pain Abdominal distention/Ascites Generalized weakness - Continue pain medications as ordered -- Status post paracentesis with 1.9 L fluid removed -Continue supportive care   Anemia Thrombocytopenia - worsening, hemoglobin 7.5 - Recommend PRBC transfusion for hemoglobin <7.0. - Worsening platelets 86K.  No transfusional intervention required at this time. -- Continue to monitor CBC with differential  DVT -- On IV heparin  drip, continue per protocol -- Monitor for bleeding   Code Status DNR-Limited  Subjective:  Patient seen awake and alert laying in bed in ICU.  Patient's wife is at bedside.  He is weak appearing.  States that he is trying.  No acute distress is noted at this  time.  Objective:   Intake/Output Summary (Last 24 hours) at 03/30/2024 1026 Last data filed at 03/30/2024 0900 Gross per 24 hour  Intake 2281.56 ml  Output 500 ml  Net 1781.56 ml     PHYSICAL EXAMINATION: ECOG PERFORMANCE STATUS: 4 - Bedbound  Vitals:   03/30/24 0941 03/30/24 0948  BP:    Pulse: (!) 117 (!) 116  Resp: (!) 22 (!) 22  Temp:    SpO2: (!) 89% 97%   Filed Weights   03/28/24 0500 03/29/24 0500 03/30/24 0500  Weight: 165 lb 2 oz (74.9 kg) 172 lb 6.4 oz (78.2 kg) 172 lb 6.4 oz (78.2 kg)    GENERAL: alert, + weak appearing + chronically ill-appearing  SKIN: skin color, texture, turgor are normal, no rashes or significant lesions EYES: normal, conjunctiva are pink and non-injected, sclera clear OROPHARYNX: no exudate, no erythema and lips, buccal mucosa, and tongue normal  NECK: supple, thyroid  normal size, non-tender, without nodularity LYMPH: no palpable lymphadenopathy in the cervical, axillary or inguinal LUNGS: clear to auscultation and percussion with normal breathing effort HEART: regular rate & rhythm and no murmurs and no lower extremity edema ABDOMEN: abdomen soft, non-tender and normal bowel sounds MUSCULOSKELETAL: no cyanosis of digits and no clubbing  PSYCH: alert & oriented x 3 with fluent speech NEURO: no focal motor/sensory deficits   All questions were answered. The patient knows to call the clinic with any problems, questions or concerns.   The total time spent in the appointment was 40 minutes encounter with patient including review of chart and various tests results, discussions about plan of care and coordination  of care plan  [name], NP 03/30/2024 10:26 AM    Labs Reviewed:  Lab Results  Component Value Date   WBC 55.6 (HH) 03/30/2024   HGB 7.5 (L) 03/30/2024   HCT 24.1 (L) 03/30/2024   MCV 94.9 03/30/2024   PLT 86 (L) 03/30/2024   Recent Labs    03/26/24 1044 03/26/24 1047 03/27/24 0409 03/27/24 1513 03/28/24 1120  03/28/24 1617 03/29/24 0430 03/29/24 1214 03/30/24 0252  NA 138   < > 139   < >  --  137 135 135 134*  K 4.3   < > 4.0   < >  --  4.6 4.4 4.4 4.5  CL 100   < > 102   < >  --  101 100 99 97*  CO2 21*  --  19*   < >  --  18* 18* 17* 17*  GLUCOSE 87   < > 92   < >  --  129* 137* 112* 97  BUN 52*   < > 47*   < >  --  63* 67* 68* 70*  CREATININE 2.84*   < > 2.74*   < >  --  4.18* 4.17* 4.20* 4.48*  CALCIUM 8.7*  --  8.5*   < >  --  7.7* 7.5* 7.4* 7.2*  GFRNONAA 22*  --  23*   < >  --  14* 14* 14* 13*  PROT 5.9*  --  5.4*  --   --  5.2* 5.3*  --   --   ALBUMIN 3.3*  --  3.0*   < > 2.7* 2.8* 2.9*  --   --   AST 108*  --  96*  --   --  91* 96*  --   --   ALT 42  --  33  --   --  30 31  --   --   ALKPHOS 516*  --  465*  --   --  492* 524*  --   --   BILITOT 4.9*  --  4.3*  --   --  4.2* 4.8*  --   --   BILIDIR 3.5*  --   --   --   --   --   --   --   --   IBILI 1.4*  --   --   --   --   --   --   --   --    < > = values in this interval not displayed.    Studies Reviewed:  US  EKG SITE RITE Result Date: 03/30/2024 If Site Rite image not attached, placement could not be confirmed due to current cardiac rhythm.  ECHOCARDIOGRAM COMPLETE Result Date: 03/29/2024    ECHOCARDIOGRAM REPORT   Patient Name:   Jared [name] Date of Exam: 03/29/2024 Medical Rec #:  [id number]     Height:       71.0 in Accession #:    [id number]    Weight:       172.4 lb Date of Birth:  Mar 05, 1946     BSA:          1.980 m Patient Age:    78 years      BP:           107/63 mmHg Patient Gender: M             HR:           113 bpm. Exam  Location:  Inpatient Procedure: 2D Echo (Both Spectral and Color Flow Doppler were utilized during            procedure). Indications:    Pulmonary Hypertension  History:        Patient has prior history of Echocardiogram examinations. CHF;                 Risk Factors:Hypertension.  Sonographer:    Charmaine Gaskins Referring Phys: [id number] PAULA SOUTHERLY IMPRESSIONS  1. Left ventricular ejection  fraction, by estimation, is 65 to 70%. The left ventricle has normal function. The left ventricle has no regional wall motion abnormalities. There is moderate left ventricular hypertrophy. Left ventricular diastolic parameters are indeterminate. There is the interventricular septum is flattened in systole and diastole, consistent with right ventricular pressure and volume overload.  2. Right ventricular systolic function is normal. The right ventricular size is moderately enlarged. There is severely elevated pulmonary artery systolic pressure. The estimated right ventricular systolic pressure is 80.0 mmHg.  3. Right atrial size was severely dilated.  4. A small pericardial effusion is present.  5. The mitral valve is normal in structure. Mild to moderate mitral valve regurgitation.  6. The tricuspid valve is abnormal. Tricuspid valve regurgitation is severe.  7. The aortic valve is tricuspid. Aortic valve regurgitation is trivial. Aortic valve sclerosis is present, with no evidence of aortic valve stenosis.  8. Aortic dilatation noted. There is dilatation of the ascending aorta, measuring 40 mm.  9. The inferior vena cava is dilated in size with <50% respiratory variability, suggesting right atrial pressure of 15 mmHg. FINDINGS  Left Ventricle: Left ventricular ejection fraction, by estimation, is 65 to 70%. The left ventricle has normal function. The left ventricle has no regional wall motion abnormalities. The left ventricular internal cavity size was normal in size. There is  moderate left ventricular hypertrophy. The interventricular septum is flattened in systole and diastole, consistent with right ventricular pressure and volume overload. Left ventricular diastolic parameters are indeterminate. Right Ventricle: The right ventricular size is moderately enlarged. No increase in right ventricular wall thickness. Right ventricular systolic function is normal. There is severely elevated pulmonary artery systolic  pressure. The tricuspid regurgitant velocity is 4.03 m/s, and with an assumed right atrial pressure of 15 mmHg, the estimated right ventricular systolic pressure is 80.0 mmHg. Left Atrium: Left atrial size was normal in size. Right Atrium: Right atrial size was severely dilated. Pericardium: A small pericardial effusion is present. Mitral Valve: The mitral valve is normal in structure. Mild to moderate mitral valve regurgitation. Tricuspid Valve: The tricuspid valve is abnormal. Tricuspid valve regurgitation is severe. Aortic Valve: The aortic valve is tricuspid. Aortic valve regurgitation is trivial. Aortic valve sclerosis is present, with no evidence of aortic valve stenosis. Pulmonic Valve: The pulmonic valve was grossly normal. Pulmonic valve regurgitation is trivial. Aorta: The aortic root is normal in size and structure and aortic dilatation noted. There is dilatation of the ascending aorta, measuring 40 mm. Venous: The inferior vena cava is dilated in size with less than 50% respiratory variability, suggesting right atrial pressure of 15 mmHg. IAS/Shunts: The interatrial septum was not well visualized.  LEFT VENTRICLE PLAX 2D LVIDd:         3.90 cm     Diastology LVIDs:         2.50 cm     LV e' medial:    7.40 cm/s LV PW:         1.30 cm  LV E/e' medial:  12.6 LV IVS:        1.40 cm     LV e' lateral:   9.36 cm/s LVOT diam:     2.00 cm     LV E/e' lateral: 10.0 LVOT Area:     3.14 cm  LV Volumes (MOD) LV vol d, MOD A2C: 76.4 ml LV vol d, MOD A4C: 60.3 ml LV vol s, MOD A2C: 24.7 ml LV vol s, MOD A4C: 22.3 ml LV SV MOD A2C:     51.7 ml LV SV MOD A4C:     60.3 ml LV SV MOD BP:      47.1 ml RIGHT VENTRICLE RV Basal diam:  4.30 cm RV Mid diam:    4.20 cm RV S prime:     12.20 cm/s TAPSE (M-mode): 2.0 cm RVSP:           80.0 mmHg LEFT ATRIUM             Index        RIGHT ATRIUM            Index LA diam:        3.50 cm 1.77 cm/m   RA Pressure: 15.00 mmHg LA Vol (A2C):   66.7 ml 33.69 ml/m  RA Area:     29.10  cm LA Vol (A4C):   66.0 ml 33.34 ml/m  RA Volume:   110.00 ml  55.57 ml/m LA Biplane Vol: 70.0 ml 35.36 ml/m   AORTA Ao Root diam: 3.70 cm Ao Asc diam:  3.95 cm MITRAL VALVE                  TRICUSPID VALVE MV Area (PHT): 4.68 cm       TR Peak grad:   65.0 mmHg MV Decel Time: 162 msec       TR Vmax:        403.00 cm/s MR Peak grad:    87.6 mmHg    Estimated RAP:  15.00 mmHg MR Mean grad:    64.0 mmHg    RVSP:           80.0 mmHg MR Vmax:         468.00 cm/s MR Vmean:        379.0 cm/s   SHUNTS MR PISA:         2.26 cm     Systemic Diam: 2.00 cm MR PISA Eff ROA: 19 mm MR PISA Radius:  0.60 cm MV E velocity: 93.40 cm/s MV A velocity: 116.00 cm/s MV E/A ratio:  0.81 Lonni Nanas MD Electronically signed by Lonni Nanas MD Signature Date/Time: 03/29/2024/1:58:20 PM    Final    US  Paracentesis Result Date: 03/28/2024 INDICATION: Inpatient with CLL, CKD 3a with concern for peritonitis. Recent imaging demonstrates ascites. EXAM: ULTRASOUND GUIDED DIAGNOSTIC AND THERAPEUTIC PARACENTESIS MEDICATIONS: 10 mL 1% lidocaine  COMPLICATIONS: None immediate. PROCEDURE: Informed written consent was obtained from the patient after a discussion of the risks, benefits and alternatives to treatment. A timeout was performed prior to the initiation of the procedure. Initial ultrasound scanning demonstrates a moderate amount of ascites within the right lower abdominal quadrant. The right lower abdomen was prepped and draped in the usual sterile fashion. 1% lidocaine  was used for local anesthesia. Following this, a 19 gauge, 7-cm, Yueh catheter was introduced. An ultrasound image was saved for documentation purposes. The paracentesis was performed. The paracentesis was stopped prior to complete drainage of fluid due to concern for  hypotension. The catheter was removed and a dressing was applied. The patient tolerated the procedure well without immediate post procedural complication. FINDINGS: A total of  approximately 1.9 liters of dark amber fluid was removed. Samples were sent to the laboratory as requested by the clinical team. IMPRESSION: Successful ultrasound-guided paracentesis yielding 1.9 liters of peritoneal fluid. Performed by Laymon Coast, NP under the supervision of Dr. Luverne PLAN: If the patient eventually requires >/=2 paracenteses in a 30 day period, candidacy for formal evaluation by the [facility] will be assessed. Electronically Signed   By: Marcey Luverne M.D.   On: 03/28/2024 09:00   DG CHEST PORT 1 VIEW Result Date: 03/27/2024 EXAM: 1 VIEW(S) XRAY OF THE CHEST 03/27/2024 05:53:00 PM COMPARISON: Chest abdomen and pelvis CT yesterday at 11:26 am. Portable chest yesterday at 11:11 am. CLINICAL HISTORY: [id number] Seen in radiology department [id number] Seen in radiology department [phone] FINDINGS: LUNGS AND PLEURA: Moderate slightly increased left layering pleural effusion is noted, small unchanged right pleural effusion. There is overlying consolidation or atelectasis in the left base. No other focal lung infiltrate is seen with low lung volumes. No pneumothorax. HEART AND MEDIASTINUM: Mild cardiomegaly. No vascular congestion is seen. The aorta is tortuous with a stable mediastinum and scattered calcific plaques. BONES AND SOFT TISSUES: Degenerative change thoracic spine with slight dextroscoliosis. IMPRESSION: 1. Moderate, slightly increased left pleural effusion with small, unchanged right pleural effusion. 2. Left basilar consolidation or atelectasis as before. otherwise clear hypoinflated lungs . 3. Mild cardiomegaly. Electronically signed by: Francis Quam MD 03/27/2024 08:18 PM EST RP Workstation: [id number]   VAS US  LOWER EXTREMITY VENOUS (DVT) Result Date: 03/27/2024  Lower Venous DVT Study Patient Name:  AUBREY VOONG  Date of Exam:   03/27/2024 Medical Rec #: [id number]      Accession #:    [id number] Date of Birth:  [birth year]      Patient Gender: M Patient Age:   50 years Exam Location:  [facility] Procedure:      VAS US  LOWER EXTREMITY VENOUS (DVT) Referring Phys: ELSIE MONTCLAIR --------------------------------------------------------------------------------  Indications: D-Dimer >20. Ascites, abdominal distention.  Risk Factors: Myelodysplastic Syndromes/Chronic lymphocytic leukemia diagnosed 2025 Limitations: Edema. Positioning. Comparison Study: No prior study on file Performing Technologist: Alberta Lis RVS  Examination Guidelines: A complete evaluation includes B-mode imaging, spectral Doppler, color Doppler, and power Doppler as needed of all accessible portions of each vessel. Bilateral testing is considered an integral part of a complete examination. Limited examinations for reoccurring indications may be performed as noted. The reflux portion of the exam is performed with the patient in reverse Trendelenburg.  +---------+---------------+---------+-----------+----------+-------------------+ RIGHT    CompressibilityPhasicitySpontaneityPropertiesThrombus Aging      +---------+---------------+---------+-----------+----------+-------------------+ CFV      Full           Yes      Yes                                      +---------+---------------+---------+-----------+----------+-------------------+ SFJ      Full                                                             +---------+---------------+---------+-----------+----------+-------------------+ FV Prox  Full           Yes      No                                       +---------+---------------+---------+-----------+----------+-------------------+ FV Mid   Full                                                             +---------+---------------+---------+-----------+----------+-------------------+ FV DistalFull                                                              +---------+---------------+---------+-----------+----------+-------------------+ PFV      Full           Yes      Yes                                      +---------+---------------+---------+-----------+----------+-------------------+ POP      Full           Yes      Yes                                      +---------+---------------+---------+-----------+----------+-------------------+ PTV      None                                         Acute               +---------+---------------+---------+-----------+----------+-------------------+ PERO                                                  Not well visualized +---------+---------------+---------+-----------+----------+-------------------+   +---------+---------------+---------+-----------+----------+-------------------+ LEFT     CompressibilityPhasicitySpontaneityPropertiesThrombus Aging      +---------+---------------+---------+-----------+----------+-------------------+ CFV      Full           Yes      Yes                                      +---------+---------------+---------+-----------+----------+-------------------+ SFJ      Full                                                             +---------+---------------+---------+-----------+----------+-------------------+ FV Prox  Full           Yes      Yes                                      +---------+---------------+---------+-----------+----------+-------------------+  FV Mid   Full                                                             +---------+---------------+---------+-----------+----------+-------------------+ FV DistalFull                                                             +---------+---------------+---------+-----------+----------+-------------------+ PFV      Full           Yes      Yes                                      +---------+---------------+---------+-----------+----------+-------------------+  POP                     Yes      Yes                  patent by color and                                                       Doppler             +---------+---------------+---------+-----------+----------+-------------------+ PTV      None                                         Acute               +---------+---------------+---------+-----------+----------+-------------------+ PERO     None                                         Acute               +---------+---------------+---------+-----------+----------+-------------------+     Summary: RIGHT: - Findings consistent with acute deep vein thrombosis involving the right posterior tibial veins.  Significant interstitial edema noted throughout the extremity  LEFT: - Findings consistent with acute deep vein thrombosis involving the left posterior tibial veins, and left peroneal veins.  Significant interstitial edema noted throughout the extremity.  *See table(s) above for measurements and observations. Electronically signed by Penne Colorado MD on 03/27/2024 at 6:03:26 PM.    Final    NM Pulmonary Perfusion Result Date: 03/27/2024 CLINICAL DATA:  Pulmonary embolism (PE) suspected, high prob EXAM: NUCLEAR MEDICINE PERFUSION LUNG SCAN TECHNIQUE: Perfusion images were obtained in multiple projections after intravenous injection of radiopharmaceutical. No ventilation imaging performed. RADIOPHARMACEUTICALS:  4.18 mCi Tc-60m MAA IV COMPARISON:  Chest radiographs and chest CT 03/26/2024. FINDINGS: There are no wedge-shaped perfusion defects to suggest acute pulmonary embolism. Left pleural effusion noted, similar to recent prior studies. IMPRESSION: No evidence of acute pulmonary embolism on perfusion scintigraphy  by PISAPED criteria. Grossly stable small left pleural effusion. Electronically Signed   By: Elsie Perone M.D.   On: 03/27/2024 16:57   US  Abdomen Limited RUQ (LIVER/GB) Result Date: 03/26/2024 CLINICAL DATA:  Abdominal  pain. EXAM: ULTRASOUND ABDOMEN LIMITED RIGHT UPPER QUADRANT COMPARISON:  CT dated 03/26/2024. FINDINGS: Gallbladder: There is stone and layering sludge within the gallbladder. The gallbladder wall is mildly thickened measuring 5 mm in thickness, possibly related to ascites. Negative sonographic Murphy's sign. Common bile duct: Diameter: 8 mm Liver: The liver demonstrates a normal echogenicity. There is apparent minimal irregularity of the liver contour which may represent changes of cirrhosis. Clinical correlation recommended. Portal vein is patent on color Doppler imaging with normal direction of blood flow towards the liver. Other: Moderate ascites noted. IMPRESSION: 1. Gallbladder stone and sludge. No sonographic evidence of acute cholecystitis. 2. Moderate ascites. 3. Questionable cirrhosis.  Clinical correlation is recommended. Electronically Signed   By: Vanetta Chou M.D.   On: 03/26/2024 14:03   CT CHEST ABDOMEN PELVIS WO CONTRAST Result Date: 03/26/2024 EXAM: CT CHEST, ABDOMEN AND PELVIS WITHOUT CONTRAST 03/26/2024 11:41:11 AM TECHNIQUE: CT of the chest, abdomen and pelvis was performed without the administration of intravenous contrast. Multiplanar reformatted images are provided for review. Automated exposure control, iterative reconstruction, and/or weight based adjustment of the mA/kV was utilized to reduce the radiation dose to as low as reasonably achievable. COMPARISON: CT of the abdomen and pelvis dated 03/01/2024. CLINICAL HISTORY: History of hemorrhagic gastritis. Distended abdomen. Likely ascites. Increased bilirubin level. FINDINGS: CHEST: MEDIASTINUM AND LYMPH NODES: There is moderate calcific coronary artery disease present. Pericardium is unremarkable. The central airways are clear. No mediastinal, hilar or axillary lymphadenopathy. There is moderate calcific atheromatous disease within the thoracic aorta. The esophagus is unremarkable. LUNGS AND PLEURA: There is a small patchy,  reticular area of opacification/consolidation present anteriorly within the right upper lobe underlying the right first costosternal joint. There are patchy, streaky bronchovascular opacities present within the lung bases bilaterally. There is improved aeration of the lower lobes relative to the previous study. The left-sided pleural effusion has mildly worsened in the interim. No pneumothorax. ABDOMEN AND PELVIS: LIVER: The liver is unremarkable. GALLBLADDER AND BILE DUCTS: There is radiopaque material laying independently within the gallbladder. No biliary ductal dilatation. SPLEEN: The spleen is enlarged. PANCREAS: No acute abnormality. ADRENAL GLANDS: No acute abnormality. KIDNEYS, URETERS AND BLADDER: No stones in the kidneys or ureters. No hydronephrosis. No perinephric or periureteral stranding. Urinary bladder is unremarkable. GI AND BOWEL: Stomach demonstrates no acute abnormality. There is no bowel obstruction. REPRODUCTIVE ORGANS: No acute abnormality. PERITONEUM AND RETROPERITONEUM: There is moderate ascites present. No free air. VASCULATURE: Aorta is normal in caliber. ABDOMINAL AND PELVIS LYMPH NODES: No lymphadenopathy. BONES AND SOFT TISSUES: The patient is status post left total hip arthroplasty. There are advanced degenerative changes within the lower lumbar spine. The patient is status post decompression laminectomies. No acute osseous abnormality. No focal soft tissue abnormality. IMPRESSION: 1. Small patchy reticular opacification/consolidation in the right upper lobe and patchy streaky bronchovascular opacities at the lung bases bilaterally; aeration of the lower lobes has improved compared to 03/01/24 2. Mildly worsened left pleural effusion 3. Moderate ascites 4. Splenomegaly 5. Radiopaque material within the gallbladder, compatible with gallstones 6. Moderate calcific coronary artery disease and moderate calcific atheromatous disease of the thoracic aorta Electronically signed by: Evalene Coho MD 03/26/2024 12:18 PM EST RP Workstation: [id number]   DG Chest Port 1 View Result Date: 03/26/2024 CLINICAL  DATA:  Possible sepsis. EXAM: PORTABLE CHEST 1 VIEW COMPARISON:  09/20/2017 FINDINGS: Lungs are hypoinflated with bibasilar opacification left worse than right likely small layering left effusion with bibasilar atelectasis. Infection in the lung bases is possible. Cardiomediastinal silhouette and remainder of the exam is unchanged. IMPRESSION: Hypoinflation with bibasilar opacification left worse than right likely small layering left effusion with bibasilar atelectasis. Infection in the lung bases is possible. Electronically Signed   By: Toribio Agreste M.D.   On: 03/26/2024 11:42   DG Abd 1 View Result Date: 03/04/2024 CLINICAL DATA:  Abdominal distension. EXAM: ABDOMEN - 1 VIEW COMPARISON:  Radiograph dated 02/27/2024. FINDINGS: No bowel dilatation or evidence of obstruction. Air and contrast noted in the colon. Degenerative changes spine. Total left hip arthroplasty. No acute osseous pathology. IMPRESSION: Nonobstructive bowel gas pattern. Electronically Signed   By: Vanetta Chou M.D.   On: 03/04/2024 18:35   US  RENAL Result Date: 03/03/2024 CLINICAL DATA:  Renal failure. EXAM: RENAL / URINARY TRACT ULTRASOUND COMPLETE COMPARISON:  None Available. FINDINGS: Right Kidney: Renal measurements: 10.0 cm x 5.3 cm x 5.2 cm = volume: 142.6 mL. There is diffusely increased echogenicity of the renal parenchyma. A 2.3 cm x 2.1 cm x 1.6 cm simple right renal cyst is seen within the mid right kidney. No hydronephrosis is visualized. Left Kidney: Renal measurements: 10.3 cm x 4.6 cm x 5.1 cm = volume: 125.1 mL. There is diffusely increased echogenicity of the renal parenchyma. A 1.9 cm x 1.7 cm x 1.6 cm simple left renal cyst is seen with the mid to lower left kidney. No hydronephrosis is visualized. Bladder: Appears normal for degree of bladder distention. The bilateral ureteral jets are not  visualized peer Other: There is a moderate amount of ascites. IMPRESSION: 1. Bilateral echogenic kidneys which likely represents sequelae associated with medical renal disease. 2. Bilateral simple renal cysts. 3. Ascites. Electronically Signed   By: Suzen Dials M.D.   On: 03/03/2024 18:03   CT ABDOMEN PELVIS WO CONTRAST Result Date: 03/01/2024 CLINICAL DATA:  Abdominal distension. EXAM: CT ABDOMEN AND PELVIS WITHOUT CONTRAST TECHNIQUE: Multidetector CT imaging of the abdomen and pelvis was performed following the standard protocol without IV contrast. RADIATION DOSE REDUCTION: This exam was performed according to the departmental dose-optimization program which includes automated exposure control, adjustment of the mA and/or kV according to patient size and/or use of iterative reconstruction technique. COMPARISON:  CT abdomen pelvis dated 02/24/2024. FINDINGS: Evaluation of this exam is limited in the absence of intravenous contrast as well as due to respiratory motion. Evaluation is also limited due to anasarca and streak artifact caused by left hip arthroplasty. Lower chest: Small bilateral pleural effusions. Large areas of consolidative changes involving the lower lobes bilaterally may represent atelectasis or pneumonia and progressed since the prior CT. No intra-abdominal free air. Diffuse mesenteric edema and small ascites. Hepatobiliary: The liver is grossly unremarkable. No biliary dilatation. Gallstone. Vicarious excretion of contrast also noted in the gallbladder. Pancreas: Unremarkable. No pancreatic ductal dilatation or surrounding inflammatory changes. Spleen: Normal in size without focal abnormality. Adrenals/Urinary Tract: The adrenal glands unremarkable. There is no hydronephrosis or nephrolithiasis on either side. Bilateral perinephric stranding, nonspecific. The urinary bladder is suboptimally evaluated due to streak artifact caused by left hip arthroplasty. Stomach/Bowel: Oral contrast  noted throughout the bowel and colon. There is sigmoid diverticulosis. No bowel obstruction. Normal appendix. Vascular/Lymphatic: Moderate aortoiliac atherosclerotic disease. The IVC is grossly unremarkable. No portal venous gas. No obvious adenopathy. Reproductive: The prostate  gland is poorly visualized. Other: Diffuse mesenteric and subcutaneous edema and anasarca, worsened since the prior CT. Musculoskeletal: Osteopenia with degenerative changes. Total left hip arthroplasty. No acute osseous pathology. IMPRESSION: 1. No bowel obstruction. Normal appendix. 2. Sigmoid diverticulosis. 3. Cholelithiasis. 4. Small bilateral pleural effusions with large areas of consolidative changes involving the lower lobes bilaterally may represent atelectasis or pneumonia and progressed since the prior CT. 5. Anasarca, worsened since the prior CT. 6.  Aortic Atherosclerosis (ICD10-I70.0). Electronically Signed   By: Vanetta Chou M.D.   On: 03/01/2024 15:51   Attending Note  I personally saw the patient, reviewed the chart and examined the patient. The plan of care was discussed with the patient and the admitting team. I agree with the assessment and plan as documented above. Thank you very much for the consultation. I met him today, wife and son in law at bedside. Unfortunately he appears worse compared to Friday, now on pressors but still alert,conversing normally He understands overall prognosis is poor, he agrees to DNR.DNI I agree that aggressive resuscitation measures are not a good idea given his underlying co-morbidities At this time, although he is requiring pressors, he is completely alert, wants to sit in the chair I agree with continuing current measures if he shows improvement. If he deteriorates, then he is very acceptable to palliative measures and keeping him comfortable. We will continue to follow him.

## 2024-03-30 NOTE — Progress Notes (Addendum)
 [facility] Progress Note  Subjective:  [place] O2 ranging from 2L to 6L HFNC 2.1 L in and 850 cc UOP yesterday, net +1.3 L Today only 150 cc UOP Creat up to 4.48 today, bun 70 K+ 4.5 WBC 55K Uric acid down to 8 from 15  Presentation summary: 78 y.o. year-old w/ PMH of  CLL, MDS, HTN, CKD 3a, chronic diastolic HF who presented 11/13 to ED sent from hem-onc clinic for ^^WBC/ pmn's concerning for infection. In ED BP was 115/77, HR 87, RR 15-20, temp 97, 94% sats on RA. Labs showed K+ 4.1, bun 49,  creat 2.6, alb 3.3, uric acid 15.7, lipase 77, ast 108, alt 42, tbili 4.9, LDH 1113. LA 1.3, 1.4. WBC 54K, Hb 11.9. D-dimer > 20, PT 15.5, INR 1.2, PTT 40. TSH 17. UA 0-5 rbc/wbc, prot 30.  Pt looked dehydrated w/ ascites, scleral icterus. Found to have DVT in L leg. V/Q was negative. Pt started on broad-spec IV abx, had paracentesis w/ IR for 1.9 L. BP started to deteriorate after that. Given albumin and midodrine, then needed levophed for BP in the 70s. Pt moved to ICU. Creatinine increased up to 3.5 then to 4.1. We were asked to see for renal failure.   Vitals:   03/30/24 1315 03/30/24 1330 03/30/24 1345 03/30/24 1400  BP: (!) 97/51 (!) 94/55 (!) 100/52   Pulse:   (!) 113 (!) 107  Resp: (!) 23 (!) 25 (!) 23 (!) 21  Temp:      TempSrc:      SpO2:   96% 94%  Weight:      Height:        Exam: Gen alert, elderly, a bit lethargic No jvd or bruits Chest clear bilat to bases RRR no MRG Abd 3+ tight ascites, +bs, no rebound Ext 1-2+ bilat LE edema, no other edema Neuro is alert, no asterixis, nonfocal     Home bp meds: Norvasc  10 mg daily (on pause) Jardiance (paused) Imdur  (paused) Lisinopril  20mg  at bedtime (paused)      Date                            Creat               eGFR (ml/min) 2009- 2015                  1.19- 1.33 2019                            1.29- 1.34 2024                            1.31 May 2023                     1.43 12/31/23                        1.74                  39 ml/min         10/13- 03/08/24           1.34- 3.20        19- 54 ml/min   03/19/24  2.62 03/26/24                      2.68 11/14                           2.74 11/15                           3.49 11/16                           4.17   UA 11/13 - amber, prot 30, rare bact, 0-5 rbc/ wbc/ epi     Assessment/ Plan AKI on CKD 3a: b/l creatinine is 1.3- 1.7, from aug- oct 2025, eGFR 39- 54 ml/min. Creatinine here was 2.6 on admission 11/06, in the setting of low-grade fevers and very high WBC's. BP's were stable initially then dropped into the 70-80s requiring ICU and vasopressor support. Renal function worsened along with the hypotension. UA was negative, CT abd showed normal kidneys w/o obstruction. 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 S/P rasburicase. UOP is down today. Creat rising. Pt is not a good RRT candidate given sig comorbidities. Recommended cont supportive care w/ IV abx,  pressor support. Appreciate palliative care assistance.  Shock: mixed due to sepsis and cardiogenic (RV failure/ pHTN). Getting IV abx, vasopressors and midodrine support. Per CCM.  Recurrent ascites: s/p paracentesis on 11/15   Rob Kingston Guiles MD  [facility] 03/30/2024, 3:56 PM  Recent Labs  Lab 03/26/24 1103 03/26/24 2156 03/28/24 1617 03/29/24 0430 03/29/24 1214 03/30/24 0252  HGB  --    < > 7.1* 8.0*  --  7.5*  ALBUMIN  --    < > 2.8* 2.9*  --   --   CALCIUM  --    < > 7.7* 7.5* 7.4* 7.2*  PHOS 4.3  --   --   --   --   --   CREATININE  --    < > 4.18* 4.17* 4.20* 4.48*  K  --    < > 4.6 4.4 4.4 4.5   < > = values in this interval not displayed.   Recent Labs  Lab 03/26/24 1456  IRON 46  TIBC 221*  FERRITIN 2,842*   Inpatient medications:  allopurinol  50 mg Oral QODAY   brimonidine   1 drop Left Eye BID   Chlorhexidine  Gluconate Cloth  6 each Topical Daily   dorzolamide -timolol   1 drop Left Eye BID   feeding  supplement  1 Container Oral TID BM   hydrocortisone sod succinate (SOLU-CORTEF) inj  50 mg Intravenous Q6H   latanoprost   1 drop Left Eye QHS   midodrine  15 mg Oral TID WC   pantoprazole  (PROTONIX ) IV  40 mg Intravenous Q24H   pneumococcal 20-valent conjugate vaccine  0.5 mL Intramuscular Tomorrow-1000   sodium bicarbonate  650 mg Oral TID   sodium chloride  flush  3 mL Intravenous Q12H   tamsulosin   0.4 mg Oral QHS    heparin  2,150 Units/hr (03/30/24 1457)   linezolid (ZYVOX) IV Stopped (03/30/24 9177)   norepinephrine (LEVOPHED) Adult infusion 12 mcg/min (03/30/24 1457)   piperacillin-tazobactam (ZOSYN)  IV Stopped (03/30/24 1411)   acetaminophen  **OR** acetaminophen , HYDROcodone -acetaminophen , melatonin, ondansetron  **OR** ondansetron  (ZOFRAN ) IV, mouth rinse

## 2024-03-30 NOTE — Progress Notes (Signed)
 [id number] [facility] Liaison Note  This is a current Home Health patient with [facility].  [facility] will continue to follow for discharge disposition.  Please call with any questions.  Jared [name] [facility] Liaison [phone]

## 2024-03-30 NOTE — Consult Note (Signed)
 Consultation Note Date: 03/30/2024   Patient Name: Jared [name]  DOB: 02-Jan-1946  MRN: [id number]  Age / Sex: 78 y.o., male  PCP: [name], FNP Referring Physician: Mannam, Praveen, MD  Reason for Consultation: Establishing goals of care   HPI/Patient Profile: 78 y.o. male admitted on 03/26/2024   Palliative care was consulted to assist with symptom management, goals of care discussion,prognosis clarification, and family support for this 78 year old gentleman with complex hematologic disease and acute critical illness.   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   Since admission, he has developed worsening hypotension requiring escalating pressor support, fluctuating leukemoid reaction likely from infection and marrow distress, and hyperuricemia requiring rasburicase. He underwent paracentesis with removal of 1.9 L of dark amber ascitic fluid on 11/15, after which he experienced a marked decline in blood pressure requiring rapid response activation and transfer to the ICU for vasopressor therapy.  Renal services are also consulted, renally, he has AKI on CKD stage 3a, with baseline creatinine 1.3-1.7 and current values in the mid-2s. The renal picture appears most consistent with hypoperfusion injury in the context of persistent hypotension, with possible contribution from tumor lysis physiology given significantly elevated uric acid requiring rasburicase. Urine output is being monitored, but overall renal reserve is limited. Nephrology notes that he is not an ideal candidate for renal replacement therapy given frailty, burden of disease, and multiorgan failure. Supportive care   is ongoing, but clinical  trajectory remains guarded.  Clinical Assessment and Goals of Care: PCCM colleagues had code status discussions with patient and patient elected for DNR DNI. Palliative care consult continues.  Patient seen and examined, also discussed with wife Fronie present at the bedside.   Palliative medicine is specialized medical care for people living with serious illness. It focuses on providing relief from the symptoms and stress of a serious illness. The goal is to improve quality of life for both the patient and the family.  Goals of care: Broad aims of medical therapy in relation to the patient's values and preferences. Our aim is to provide medical care aimed at enabling patients to achieve the goals that matter most to them, given the circumstances of their particular medical situation and their constraints.    NEXT OF KIN  Wife Gwen present at bedside, 4 daughters, 2 are out of town, 2 are local.   SUMMARY OF RECOMMENDATIONS   Palliative care discussed with the patient and wife regarding code status, patient repeats his wishes for DNR DNI and wife is in agreement. There are no formally prepared advance care planning documents, and wife asks about HCPOA paperwork, will consult a chaplain for completion of advance care planning documents.   Goals of care discussions undertaken with patient and family - they understand the seriousness of his illness, including the poor-risk MDS prognosis, superimposed sepsis and  shock, the challenges with marrow recovery, and the limited reversibility expected if additional organs fail. Family understands that he is critically ill and at high risk for further decline despite aggressive treatment. At this time, they wish to continue current support while reassessing frequently. We offered ongoing anticipatory guidance, symptom support, and assistance with future decision-making should his condition worsen, the family expressed interest in continued goals of care  discussions.   We will follow along with the primary team to assist with symptom burden, communication, and aligning medical decisions with the patient's and family's goals.   Code Status/Advance Care Planning: DNR   Symptom Management:     Palliative Prophylaxis:  Frequent Pain Assessment   Psycho-social/Spiritual:  Desire for further Chaplaincy support:yes Additional Recommendations: Caregiving  Support/Resources  Prognosis:  Unable to determine  Discharge Planning: To Be Determined      Primary Diagnoses: Present on Admission:  Leukocytosis (leucocytosis)   I have reviewed the medical record, interviewed the patient and family, and examined the patient. The following aspects are pertinent.  Past Medical History:  Diagnosis Date   Arthritis    knees and back   Enlarged prostate    takes Flomax    Headache    Hypertension    stress test done about 12 yrs ago - results wnl per pt.   Social History   Socioeconomic History   Marital status: Married    Spouse name: Not on file   Number of children: Not on file   Years of education: Not on file   Highest education level: Not on file  Occupational History   Not on file  Tobacco Use   Smoking status: Former    Current packs/day: 0.00    Average packs/day: 1 pack/day for 10.0 years (10.0 ttl pk-yrs)    Types: Cigarettes    Start date: 09/06/1964    Quit date: 09/07/1974    Years since quitting: 49.5   Smokeless tobacco: Never  Vaping Use   Vaping status: Never Used  Substance and Sexual Activity   Alcohol use: No    Comment: quit 1976 - beer   Drug use: No   Sexual activity: Not on file  Other Topics Concern   Not on file  Social History Narrative   Not on file   Social Drivers of Health   Financial Resource Strain: Not on file  Food Insecurity: No Food Insecurity (03/26/2024)   Hunger Vital Sign    Worried About Running Out of Food in the Last Year: Never true    Ran Out of Food in the Last Year:  Never true  Transportation Needs: No Transportation Needs (03/26/2024)   PRAPARE - Administrator, Civil Service (Medical): No    Lack of Transportation (Non-Medical): No  Physical Activity: Not on file  Stress: Not on file  Social Connections: Socially Integrated (03/26/2024)   Social Connection and Isolation Panel    Frequency of Communication with Friends and Family: Three times a week    Frequency of Social Gatherings with Friends and Family: Three times a week    Attends Religious Services: More than 4 times per year    Active Member of Clubs or Organizations: Yes    Attends Engineer, Structural: More than 4 times per year    Marital Status: Married   Family History  Problem Relation Age of Onset   Migraines Mother    Cancer Sister    Cancer Sister    Scheduled Meds:  allopurinol  50 mg Oral QODAY   brimonidine   1 drop Left Eye BID   Chlorhexidine  Gluconate Cloth  6 each Topical Daily   dorzolamide -timolol   1 drop Left Eye BID   feeding supplement  1 Container Oral TID BM   hydrocortisone sod succinate (SOLU-CORTEF) inj  50 mg Intravenous Q6H   latanoprost   1 drop Left Eye QHS   midodrine  15 mg Oral TID WC   pantoprazole  (PROTONIX ) IV  40 mg Intravenous Q24H   pneumococcal 20-valent conjugate vaccine  0.5 mL Intramuscular Tomorrow-1000   sodium bicarbonate  650 mg Oral TID   sodium chloride  flush  3 mL Intravenous Q12H   tamsulosin   0.4 mg Oral QHS   Continuous Infusions:  heparin  2,150 Units/hr (03/30/24 0900)   linezolid (ZYVOX) IV Stopped (03/30/24 9177)   norepinephrine (LEVOPHED) Adult infusion 11 mcg/min (03/30/24 0900)   piperacillin-tazobactam (ZOSYN)  IV Stopped (03/30/24 0650)   PRN Meds:.acetaminophen  **OR** acetaminophen , HYDROcodone -acetaminophen , melatonin, ondansetron  **OR** ondansetron  (ZOFRAN ) IV, mouth rinse Medications Prior to Admission:  Prior to Admission medications   Medication Sig Start Date End Date Taking? Authorizing  Provider  acetaminophen  (TYLENOL ) 650 MG CR tablet Take 1,300 mg by mouth every 8 (eight) hours as needed for pain.   Yes [provider]  amLODipine  (NORVASC ) 10 MG tablet Take 10 mg by mouth daily.   Yes [provider]  aspirin  EC (ASPIRIN  LOW DOSE) 81 MG tablet TAKE 1 TABLET (81 MG TOTAL) BY MOUTH DAILY. SWALLOW WHOLE. 09/05/23  Yes Patwardhan, Manish J, MD  atorvastatin (LIPITOR) 10 MG tablet Take 10 mg by mouth daily. 12/22/21  Yes [provider]  Azelastine HCl 137 MCG/SPRAY SOLN Place 1 spray into both nostrils every 12 (twelve) hours as needed (for allergies or rhinitis). 05/15/23  Yes [provider]  brimonidine  (ALPHAGAN ) 0.2 % ophthalmic solution Place 1 drop into the left eye 2 (two) times daily.   Yes [provider]  dorzolamide -timolol  (COSOPT ) 2-0.5 % ophthalmic solution Place 1 drop into the left eye 2 (two) times daily.   Yes [provider]  empagliflozin (JARDIANCE) 10 MG TABS tablet Take 10 mg by mouth daily.   Yes [provider]  isosorbide  mononitrate (IMDUR ) 30 MG 24 hr tablet Take 0.5 tablets (15 mg total) by mouth daily. You can take an additional 15 mg as needed for chest pain. 05/17/23 02/26/24 Yes Wyn Jackee VEAR Mickey., NP  lisinopril  (ZESTRIL ) 20 MG tablet Take 1 tablet (20 mg total) by mouth at bedtime. 05/29/23  Yes Wyn Jackee VEAR Mickey., NP  nitroGLYCERIN  (NITROSTAT ) 0.4 MG SL tablet Place 1 tablet (0.4 mg total) under the tongue every 5 (five) minutes as needed for chest pain. 04/17/23  Yes Patwardhan, Manish J, MD  pantoprazole  (PROTONIX ) 40 MG tablet Take 1 tablet (40 mg total) by mouth 2 (two) times daily for 56 days, THEN 1 tablet (40 mg total) daily for 28 days. 03/08/24 05/31/24 Yes [place], Eric J, DO  sodium bicarbonate 650 MG tablet Take 1 tablet (650 mg total) by mouth 2 (two) times daily. 03/08/24 06/06/24 Yes [place], Eric J, DO  tamsulosin  (FLOMAX ) 0.4 MG CAPS capsule Take 0.4 mg by mouth at bedtime.   Yes  [provider]  latanoprost  (XALATAN ) 0.005 % ophthalmic solution Place 1 drop into the left eye at bedtime. Patient not taking: Reported on 03/27/2024    [provider]  lidocaine  (LIDODERM ) 5 % Place 1 patch onto the skin daily as needed (for  pain- Remove & Discard patch within 12 hours or as directed by MD). Patient not taking: Reported on 03/27/2024    [provider]   No Known Allergies Review of Systems +generalized weakness  Physical Exam Elderly appearing  Monitor noted S 1 S 2 Trace edema Has foley  Vital Signs: BP (!) 85/49   Pulse (!) 110   Temp 98.1 F (36.7 C) (Oral)   Resp (!) 22   Ht 5' 11 (1.803 m)   Wt 78.2 kg   SpO2 93%   BMI 24.04 kg/m  Pain Scale: 0-10   Pain Score: Asleep   SpO2: SpO2: 93 % O2 Device:SpO2: 93 % O2 Flow Rate: .O2 Flow Rate (L/min): 4 L/min  IO: Intake/output summary:  Intake/Output Summary (Last 24 hours) at 03/30/2024 1148 Last data filed at 03/30/2024 0900 Gross per 24 hour  Intake 2262.47 ml  Output 500 ml  Net 1762.47 ml    LBM: Last BM Date : 03/28/24 Baseline Weight: Weight: 77.4 kg Most recent weight: Weight: 78.2 kg     Palliative Assessment/Data:   PPS 50%  Time In:  10 Time Out:  11.15 Time Total:  75 Greater than 50%  of this time was spent counseling and coordinating care related to the above assessment and plan.  Signed by: Lonia Serve, MD   Please contact Palliative Medicine Team phone at [phone] for questions and concerns.  For individual provider: See Tracey

## 2024-03-30 NOTE — Progress Notes (Signed)
 Peripherally Inserted Central Catheter Placement  The IV Nurse has discussed with the patient and/or persons authorized to consent for the patient, the purpose of this procedure and the potential benefits and risks involved with this procedure.  The benefits include less needle sticks, lab draws from the catheter, and the patient may be discharged home with the catheter. Risks include, but not limited to, infection, bleeding, blood clot (thrombus formation), and puncture of an artery; nerve damage and irregular heartbeat and possibility to perform a PICC exchange if needed/ordered by physician.  Alternatives to this procedure were also discussed.  Bard Power PICC patient education guide, fact sheet on infection prevention and patient information card has been provided to patient /or left at bedside. Consent signed at bedside by wife due to altered mental status.   PICC Placement Documentation  PICC Double Lumen 03/30/24 Right Brachial 39 cm 0 cm (Active)  Indication for Insertion or Continuance of Line Vasoactive infusions 03/30/24 1743  Exposed Catheter (cm) 0 cm 03/30/24 1743  Site Assessment Clean, Dry, Intact 03/30/24 1743  Lumen #1 Status Flushed;Saline locked;Blood return noted 03/30/24 1743  Lumen #2 Status Flushed;Saline locked;Blood return noted 03/30/24 1743  Dressing Type Transparent;Securing device 03/30/24 1743  Dressing Status Antimicrobial disc/dressing in place;Clean, Dry, Intact 03/30/24 1743  Line Care Connections checked and tightened 03/30/24 1743  Line Adjustment (NICU/IV Team Only) No 03/30/24 1743  Dressing Intervention New dressing;Adhesive placed at insertion site (IV team only) 03/30/24 1743  Dressing Change Due 04/06/24 03/30/24 1743       Jared [name], [name] 03/30/2024, 5:43 PM

## 2024-03-30 NOTE — Progress Notes (Signed)
 PHARMACY - ANTICOAGULATION CONSULT NOTE  Pharmacy Consult for IV heparin  Indication: DVT  No Known Allergies  Patient Measurements: Height: 5' 11 (180.3 cm) Weight: 78.2 kg (172 lb 6.4 oz) IBW/kg (Calculated) : 75.3 HEPARIN  DW (KG): 77.4  Vital Signs: Temp: 98 F (36.7 C) (11/17 0030) Temp Source: Oral (11/17 0030) BP: 97/52 (11/17 0045)  Labs: Recent Labs    03/28/24 0225 03/28/24 1120 03/28/24 1617 03/28/24 1919 03/29/24 0239 03/29/24 0430 03/29/24 1214 03/30/24 0036  HGB 8.0*  --  7.1*  --   --  8.0*  --   --   HCT 26.0*  --  23.2*  --   --  24.8*  --   --   PLT 79*  --  66*  --   --  70*  --   --   HEPARINUNFRC <0.10*   < >  --    < > 0.15*  --  0.23* 0.42  CREATININE 3.49*  --  4.18*  --   --  4.17* 4.20*  --    < > = values in this interval not displayed.    Estimated Creatinine Clearance: 14.9 mL/min (A) (by C-G formula based on SCr of 4.2 mg/dL (H)).   Medical History: Past Medical History:  Diagnosis Date   Arthritis    knees and back   Enlarged prostate    takes Flomax    Headache    Hypertension    stress test done about 12 yrs ago - results wnl per pt.    Medications:  Scheduled:   allopurinol  50 mg Oral QODAY   brimonidine   1 drop Left Eye BID   Chlorhexidine  Gluconate Cloth  6 each Topical Daily   dorzolamide -timolol   1 drop Left Eye BID   feeding supplement  1 Container Oral TID BM   latanoprost   1 drop Left Eye QHS   midodrine  10 mg Oral TID WC   pantoprazole  (PROTONIX ) IV  40 mg Intravenous Q24H   pneumococcal 20-valent conjugate vaccine  0.5 mL Intramuscular Tomorrow-1000   sodium bicarbonate  650 mg Oral TID   sodium chloride  flush  3 mL Intravenous Q12H   tamsulosin   0.4 mg Oral QHS   Infusions:   heparin  2,150 Units/hr (03/30/24 0100)   linezolid (ZYVOX) IV Stopped (03/29/24 1916)   norepinephrine (LEVOPHED) Adult infusion 8 mcg/min (03/30/24 0100)   piperacillin-tazobactam (ZOSYN)  IV Stopped (03/29/24 2200)   PRN:  acetaminophen  **OR** acetaminophen , HYDROcodone -acetaminophen , melatonin, ondansetron  **OR** ondansetron  (ZOFRAN ) IV, mouth rinse  Assessment: Patient sent from cancer center for elevated WBC found to have new bilateral DVTs to start IV heparin .  03/30/2024 -heparin  level 0.42 therapeutic on 2150 units/hr -no bleeding per RN    Goal of Therapy:  Heparin  level 0.3-0.7 units/ml Monitor platelets by anticoagulation protocol: Yes   Plan:  continue heparin  infusion at 2150 units/hr Check heparin  level in 8 hours Daily CBC - monitor closely    Leeroy Mace RPh 03/30/2024, 1:39 AM

## 2024-03-30 NOTE — Progress Notes (Signed)
   03/30/24 1423  Spiritual Encounters  Type of Visit Initial  Care provided to: Pt and family  Referral source Physician  Reason for visit Advance directives  OnCall Visit No  Interventions  Spiritual Care Interventions Made Established relationship of care and support;Compassionate presence   I responded to a spiritual care consult request by Dr. Jeryl to provide assistance with HCPOA. I also sought to assess needs of Mr Rueter, his spouse, Fronie, and family who were at bedside.  Mr Wrinkle was alert and reflected a positive affect, seemed comforted by his siblings and wife gathered around. Mrs Stanforth appreciated the document and stated that she would consider that.  I let them know that I was available for ongoing support, and expressed gratitude to meet Mr Ellery and for his family support and love. I let Mrs Venus know some brief aspects regarding HCPOA but mostly that I wanted to support whatever needs she felt, emotional and spiritual. I affirmed family presence and their mutual support.  I let them know chaplain could return as needed and to simply ask their care team.  Hakeem Frazzini L. Delores HERO.Div Pager [phone]

## 2024-03-30 NOTE — Plan of Care (Signed)
  Problem: Education: Goal: Knowledge of General Education information will improve Description: Including pain rating scale, medication(s)/side effects and non-pharmacologic comfort measures Outcome: Progressing   Problem: Nutrition: Goal: Adequate nutrition will be maintained Outcome: Not Progressing   Problem: Pain Managment: Goal: General experience of comfort will improve and/or be controlled Outcome: Progressing

## 2024-03-30 NOTE — Progress Notes (Signed)
 eLink Physician-Brief Progress Note Patient Name: Jared [name] DOB: 1945-08-18 MRN: [id number]   Date of Service  03/30/2024  HPI/Events of Note  Maxed on peripheral norepinephrine, drowsy with recent Norco Poor urine output overnight, moderate response to Bumex challenge the day prior.  Still +4 L per documentation Already on midodrine 3 times daily  eICU Interventions  Will likely need a central line this morning, primary team to reassess  Will escalate the ceiling of his norepinephrine for the time being.  No intervention for low urine output-team considering higher dose Bumex challenge.  Poor candidate for RRT per nephrology     Intervention Category Intermediate Interventions: Hypotension - evaluation and management  [name] 03/30/2024, 5:32 AM

## 2024-03-30 NOTE — Progress Notes (Signed)
 NAME:  Jared [name], MRN:  [id number], DOB:  07-06-1945, LOS: 4 ADMISSION DATE:  03/26/2024, CONSULTATION DATE:  @TODAY @ REFERRING MD:  Dr. 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  HPI: presented to ED 02/24/24 sent from heme-onc clinic for an elevated white count and neutrophils concerning for infection.  Patient appears dehydrated, has scleral icterus, abdominal distention consistent with ascites, abdomen is not taut but soft patient does have pressure on exam but no guarding organomegaly or tenderness.   ED Course:  Initial CBC shows white count of 55.3 hemoglobin of 9.4 platelets of 113. Initial CMP shows BUN of 51 CKD with a creatinine of 2.68 alk phos of 397, albumin of 3.0, AST of 79, total protein of 5.2, total bili of 5.5, LDH of 1113. Liver/biliary ultrasound ordered showing questionable cirrhosis and moderate ascites. CT C/A/P shows bibasilar consolidations, massive ascites in abdomen, splenomegaly.  Hospital Course: Underwent US  duplex LE and was found to have DVT in left posterior tibial vein and left peroneal vein V/Q scan negative for VTE Started on broad spectrum antimicrobials including Vancomycin and Zosyn De-escalated to Ceftraixone on 11/15 Underwent IR paracentesis and drained 1.9 L ascitic fluid, BP started to deteriorate after that Given albumin and midodrine for hypotension Rapid response called on 11/15 due to hypotension, brought to ICU for pressor support.  Pertinent  Medical History   Past Medical History:  Diagnosis Date   Arthritis    knees and  back   Enlarged prostate    takes Flomax    Headache    Hypertension    stress test done about 12 yrs ago - results wnl per pt.   Significant Hospital Events: Including procedures, antibiotic start and stop dates in addition to other pertinent events   03/28/24 --> brought down to ICU for shock, started on levophed. 03/29/24 --> remains on levophed  Interim History / Subjective:  Appears tired this morning; had GOC discussion with him - made DNR/DNI. Starting stress steroids and increasing midodrine. PICC order placed for ongoing vasopressor needs. Will talk with family when they arrive.   Objective    Blood pressure (!) 107/55, pulse (!) 104, temperature 98 F (36.7 C), temperature source Oral, resp. rate (!) 22, height 5' 11 (1.803 m), weight 78.2 kg, SpO2 92%.        Intake/Output Summary (Last 24 hours) at 03/30/2024 0801 Last data filed at 03/30/2024 9348 Gross per 24 hour  Intake 2071.66 ml  Output 700 ml  Net 1371.66 ml   Filed Weights   03/28/24 0500 03/29/24 0500 03/30/24 0500  Weight: 74.9 kg 78.2 kg 78.2 kg   Examination: General: elderly, frail, tired appearing HEENT: perrla, pale conjunctiva, mucous membranes dry  Lungs: ctab, resp even and unlabored, diminished bases, 2LNC Cardiovascular: s1s2, sinus tachycardia, +JVD, trace to 1+ peripheral edema bilaterally  Abdomen: rounded, taut, mild TTP, ascites present; not rigid Extremities: 1+ pitting edema bilaterally, warm  Neuro: drowsy but wakes easily and converses, oriented, non focal  GU: foley  Resolved problem list   Assessment and Plan   #Shock: primarily septic from peritonitis - peritoneal fluid  4474, 93% PMNs, stain with PMNs, no organisms seen; component of cardiogenic shock from severe pHTN, RV dysfunction, severe TR.  - con't linezolid, zosyn  - con't levophed for MAP > 65  - consult for PICC line placed for above  - add stress steroids hydrocortisone 50mg  q6h  - increase midodrine to 15mg  TID   - follow blood and peritoneal culture data  - echo results 11/16 LVEF 65-70%, RV mod enlarged, severely elevated pHTN, estimated RVP 80, RA severely dilated, tricuspid valve regurg severe  - considered PDE-I but needs vasopressor support already; no iNO; would not want to use IV flolan. After PICC will obtain coox and CVP and consider talking with [facility] about inotropic support - but re: GOC.  - made DNR/DNI this morning after discussion   #Ascites 2/2 peritonitis, RV failure  - s/p IR paracentesis 11/15, no growth but 93% PMN - re accumulation present but became hemodynamically unstable after procedure so hesitant to tap again with increasing pressor requirements  - pain not significant at this time   #AKI on CKD IIIb; suspect cardiorenal and septic ATN  #AGMA  - nephrology consulted - not RRT candidate  - tried bumex 2mg  11/16 with only 850cc UOP/24hr; creatinine bump again today  - hold on repeated diuresis until able to obtain coox and cvp from picc - may need inotropic support to augment diuresis - sodium bicarb 650mg  TID  - con't foley, renal perfusion, strict I/O, monitor elytes and correct   #MDS #CLL #Chronic Anemia #Hyperuricemia: Uric acid 15.7 Oncology consulted; no indication he has AML transformation; receiving prn Rasburicase  - transfuse hgb < 8 - hgb 7.5 this morning; hold on transfusion for now with volume overload  - transfuse plt < 20k  - add on uric acid to morning labs  - allopurinol 50mg  daily   #DVT: - Heparin  drip  GOC discussion: made DNR/DNI this morning. See iPAL; consulted palliative care; he appears very tired; will continue to treat infection, ordering picc line and will try to treat treatables. With multiorgan dysfunction, concern we may not have much time left. Will talk with family when they get here.   Labs   CBC: Recent Labs  Lab 03/26/24 0820 03/26/24 1044 03/26/24 1047 03/27/24 0409 03/28/24 0225 03/28/24 1617 03/29/24 0430  03/30/24 0252  WBC 55.3* 54.5*  --  49.8* 62.6* 55.9* 61.0* 55.6*  NEUTROABS 49.8* 51.2*  --   --  53.3* 48.7*  --  44.8*  HGB 9.4* 9.6*   < > 9.2* 8.0* 7.1* 8.0* 7.5*  HCT 29.4* 31.4*   < > 29.8* 26.0* 23.2* 24.8* 24.1*  MCV 92.7 97.8  --  97.1 95.9 97.5 92.2 94.9  PLT 113* 115*  --  108* 79* 66* 70* 86*   < > = values in this interval not displayed.    Basic Metabolic Panel: Recent Labs  Lab 03/26/24 1103 03/27/24 0409 03/28/24 0225 03/28/24 1617 03/29/24 0430 03/29/24 1214 03/30/24 0252  NA  --  139 140 137 135 135 134*  K  --  4.0 4.2 4.6 4.4 4.4 4.5  CL  --  102 103 101 100 99 97*  CO2  --  19* 20* 18* 18* 17* 17*  GLUCOSE  --  92 101* 129* 137* 112* 97  BUN  --  47* 56* 63* 67* 68* 70*  CREATININE  --  2.74* 3.49* 4.18* 4.17* 4.20* 4.48*  CALCIUM  --  8.5* 8.1* 7.7* 7.5* 7.4* 7.2*  MG  --  2.2  --   --   --  2.1  --   PHOS 4.3  --   --   --   --   --   --    GFR: Estimated Creatinine Clearance: 14 mL/min (A) (by C-G formula based on SCr of 4.48 mg/dL (H)). Recent Labs  Lab 03/26/24 1046 03/26/24 1351 03/27/24 0409 03/28/24 0225 03/28/24 1617 03/28/24 1923 03/29/24 0430 03/30/24 0252  PROCALCITON  --   --  3.13  --   --   --   --   --   WBC  --   --  49.8* 62.6* 55.9*  --  61.0* 55.6*  LATICACIDVEN 1.3 1.4  --   --  1.9 1.8  --   --     Liver Function Tests: Recent Labs  Lab 03/26/24 0909 03/26/24 1044 03/27/24 0409 03/27/24 1513 03/28/24 1120 03/28/24 1617 03/29/24 0430  AST 79* 108* 96*  --   --  91* 96*  ALT 32 42 33  --   --  30 31  ALKPHOS 397* 516* 465*  --   --  492* 524*  BILITOT 5.5* 4.9* 4.3*  --   --  4.2* 4.8*  PROT 5.2* 5.9* 5.4*  --   --  5.2* 5.3*  ALBUMIN 3.0* 3.3* 3.0* 2.9* 2.7* 2.8* 2.9*   Recent Labs  Lab 03/26/24 1044  LIPASE 77*   Recent Labs  Lab 03/26/24 1044  AMMONIA 25    ABG    Component Value Date/Time   PHART 7.378 04/23/2023 0955   PCO2ART 35.5 04/23/2023 0955   PO2ART 78 (L) 04/23/2023 0955   HCO3  23.1 04/23/2023 0955   HCO3 20.9 04/23/2023 0955   TCO2 21 (L) 03/26/2024 1047   ACIDBASEDEF 2.0 04/23/2023 0955   ACIDBASEDEF 4.0 (H) 04/23/2023 0955   O2SAT 68 04/23/2023 0955   O2SAT 95 04/23/2023 0955     Coagulation Profile: Recent Labs  Lab 03/26/24 1044  INR 1.2    Cardiac Enzymes: No results for input(s): CKTOTAL, CKMB, CKMBINDEX, TROPONINI in the last 168 hours.  HbA1C: No results found for: HGBA1C  CBG: No results for input(s): GLUCAP in the last 168 hours.  Review of Systems:   As above  Past Medical History:  He,  has a past medical history of Arthritis, Enlarged prostate, Headache, and Hypertension.   Surgical History:   Past Surgical History:  Procedure Laterality Date   BACK SURGERY  2002   CORONARY PRESSURE/FFR STUDY N/A 04/23/2023   Procedure: CORONARY PRESSURE/FFR STUDY;  Surgeon: Elmira Newman PARAS, MD;  Location: [facility];  Service: Cardiovascular;  Laterality: N/A;   ESOPHAGOGASTRODUODENOSCOPY N/A 02/27/2024   Procedure: EGD (ESOPHAGOGASTRODUODENOSCOPY);  Surgeon: Elicia Claw, MD;  Location: [facility];  Service: Gastroenterology;  Laterality: N/A;   JOINT REPLACEMENT     KNEE ARTHROSCOPY Left 05/2004   thelbert 09/27/2010   RIGHT/LEFT HEART CATH AND CORONARY ANGIOGRAPHY N/A 04/23/2023   Procedure: RIGHT/LEFT HEART CATH AND CORONARY ANGIOGRAPHY;  Surgeon: Elmira Newman PARAS, MD;  Location: [facility];  Service: Cardiovascular;  Laterality: N/A;   SHOULDER ARTHROSCOPY WITH OPEN ROTATOR CUFF REPAIR Right 2009   /notes 09/13/2010   TOTAL HIP ARTHROPLASTY Left 10/02/2017   TOTAL HIP ARTHROPLASTY Left 10/02/2017   Procedure: LEFT TOTAL HIP ARTHROPLASTY ANTERIOR APPROACH;  Surgeon: Liam Lerner, MD;  Location: [facility];  Service: Orthopedics;  Laterality: Left;   TOTAL KNEE ARTHROPLASTY  04/16/2011   Procedure: TOTAL KNEE ARTHROPLASTY;  Surgeon: Dempsey JINNY Sensor;  Location: [facility];  Service: Orthopedics;  Laterality: Left;   LEFT TOTAL KNEE ARTHROPLASTY    TOTAL KNEE ARTHROPLASTY Right 04/19/2014   Procedure: RIGHT TOTAL KNEE ARTHROPLASTY;  Surgeon: Dempsey JINNY Sensor, MD;  Location: [facility];  Service: Orthopedics;  Laterality: Right;     Social History:   reports that he quit smoking about 49 years ago. His smoking use included cigarettes. He started smoking about 59 years ago. He has a 10 pack-year smoking history. He has never used smokeless tobacco. He reports that he does not drink alcohol and does not use drugs.   Family History:  His family history includes Cancer in his sister and sister; Migraines in his mother.   Allergies No Known Allergies   Home Medications  Prior to Admission medications   Medication Sig Start Date End Date Taking? Authorizing Provider  acetaminophen  (TYLENOL ) 650 MG CR tablet Take 1,300 mg by mouth every 8 (eight) hours as needed for pain.   Yes [provider]  amLODipine  (NORVASC ) 10 MG tablet Take 10 mg by mouth daily.   Yes [provider]  aspirin  EC (ASPIRIN  LOW DOSE) 81 MG tablet TAKE 1 TABLET (81 MG TOTAL) BY MOUTH DAILY. SWALLOW WHOLE. 09/05/23  Yes Patwardhan, Manish J, MD  atorvastatin (LIPITOR) 10 MG tablet Take 10 mg by mouth daily. 12/22/21  Yes [provider]  Azelastine HCl 137 MCG/SPRAY SOLN Place 1 spray into both nostrils every 12 (twelve) hours as needed (for allergies or rhinitis). 05/15/23  Yes [provider]  brimonidine  (ALPHAGAN ) 0.2 % ophthalmic solution Place 1 drop into the left eye 2 (two) times daily.   Yes [provider]  dorzolamide -timolol  (COSOPT ) 2-0.5 % ophthalmic solution Place 1 drop into the left eye 2 (two) times daily.   Yes [provider]  empagliflozin (JARDIANCE) 10 MG TABS tablet Take 10 mg by mouth daily.   Yes [provider]  isosorbide  mononitrate (IMDUR ) 30 MG 24 hr tablet Take 0.5 tablets (15 mg total) by mouth daily. You can take an additional 15 mg as needed for chest pain.  05/17/23 02/26/24 Yes Wyn Jackee VEAR Mickey., NP  lisinopril  (ZESTRIL ) 20 MG tablet Take 1 tablet (20 mg total) by mouth at bedtime. 05/29/23  Yes Wyn Jackee VEAR Mickey., NP  nitroGLYCERIN  (NITROSTAT ) 0.4 MG SL tablet Place 1 tablet (0.4 mg total) under the tongue every 5 (five) minutes as needed for chest pain. 04/17/23  Yes Patwardhan, Manish J, MD  pantoprazole  (PROTONIX ) 40 MG tablet Take 1 tablet (40 mg total) by mouth 2 (two) times daily for 56 days, THEN 1 tablet (40 mg total) daily for 28 days. 03/08/24 05/31/24 Yes Austria, Eric J, DO  sodium bicarbonate 650 MG tablet Take 1 tablet (650 mg total) by mouth 2 (two) times daily. 03/08/24 06/06/24 Yes [place], Camellia JINNY, DO  tamsulosin  (FLOMAX ) 0.4 MG CAPS capsule Take 0.4 mg by mouth at bedtime.   Yes [provider]  latanoprost  (XALATAN ) 0.005 % ophthalmic solution Place 1 drop into the left eye at bedtime. Patient not taking: Reported on 03/27/2024    [provider]  lidocaine  (LIDODERM ) 5 % Place 1 patch onto the skin daily as needed (for pain- Remove & Discard patch within 12 hours or as directed by MD). Patient not taking: Reported on 03/27/2024    [provider]    Critical care time: 41  The patient is critically ill with multiple organ system failure and  requires high complexity decision making for assessment and support, frequent evaluation and titration of therapies, advanced monitoring, review of radiographic studies and interpretation of complex data.    Critical Care Time devoted to patient care services, exclusive of separately billable procedures, described in this note is 40  Tinnie FORBES Adolph DEVONNA [facility] 03/30/24 8:05 AM  Please see [email or web address] for pager details.  From 7A-7P if no response, please call [phone] After hours, please call ELink [phone]

## 2024-03-30 NOTE — Progress Notes (Signed)
 PHARMACY - ANTICOAGULATION CONSULT NOTE  Pharmacy Consult for IV heparin  Indication: DVT  No Known Allergies  Patient Measurements: Height: 5' 11 (180.3 cm) Weight: 78.2 kg (172 lb 6.4 oz) IBW/kg (Calculated) : 75.3 HEPARIN  DW (KG): 77.4  Vital Signs: Temp: 98 F (36.7 C) (11/17 0600) Temp Source: Oral (11/17 0600) BP: 107/55 (11/17 0630)  Labs: Recent Labs    03/28/24 1617 03/28/24 1919 03/29/24 0239 03/29/24 0430 03/29/24 1214 03/30/24 0036 03/30/24 0252  HGB 7.1*  --   --  8.0*  --   --  7.5*  HCT 23.2*  --   --  24.8*  --   --  24.1*  PLT 66*  --   --  70*  --   --  86*  HEPARINUNFRC  --    < > 0.15*  --  0.23* 0.42  --   CREATININE 4.18*  --   --  4.17* 4.20*  --  4.48*   < > = values in this interval not displayed.    Estimated Creatinine Clearance: 14 mL/min (A) (by C-G formula based on SCr of 4.48 mg/dL (H)).   Medications: No anticoagulants PTA  Assessment: Patient sent from cancer center for elevated WBC found to have new bilateral DVT. Pharmacy consulted to dose heparin .  Today, 03/30/24 Confirmatory heparin  level = 0.48 remains therapeutic on heparin  infusion of 2150 units/hr CBC: Hgb and Plt both low. Transfused 11/16 No bleeding reported  Goal of Therapy:  Heparin  level 0.3-0.7 units/ml Monitor platelets by anticoagulation protocol: Yes   Plan:  Continue heparin  at 2150 units/hr CBC, heparin  level daily Monitor for signs of bleeding  Ronal CHRISTELLA Rav, PharmD 03/30/24 8:10 AM

## 2024-03-30 NOTE — Plan of Care (Signed)
  Problem: Clinical Measurements: Goal: Diagnostic test results will improve Outcome: Progressing   Problem: Activity: Goal: Risk for activity intolerance will decrease Outcome: Progressing   Problem: Elimination: Goal: Will not experience complications related to urinary retention Outcome: Progressing   Problem: Pain Managment: Goal: General experience of comfort will improve and/or be controlled Outcome: Progressing   Problem: Skin Integrity: Goal: Risk for impaired skin integrity will decrease Outcome: Progressing

## 2024-03-31 ENCOUNTER — Inpatient Hospital Stay (HOSPITAL_COMMUNITY)

## 2024-03-31 DIAGNOSIS — A419 Sepsis, unspecified organism: Secondary | ICD-10-CM

## 2024-03-31 DIAGNOSIS — Z515 Encounter for palliative care: Secondary | ICD-10-CM

## 2024-03-31 DIAGNOSIS — Z7189 Other specified counseling: Secondary | ICD-10-CM

## 2024-03-31 LAB — CULTURE, BLOOD (ROUTINE X 2)
Culture: NO GROWTH
Culture: NO GROWTH
Special Requests: ADEQUATE
Special Requests: ADEQUATE

## 2024-03-31 LAB — BASIC METABOLIC PANEL WITH GFR
Anion gap: 27 — ABNORMAL HIGH (ref 5–15)
BUN: 80 mg/dL — ABNORMAL HIGH (ref 8–23)
CO2: 10 mmol/L — ABNORMAL LOW (ref 22–32)
Calcium: 6.7 mg/dL — ABNORMAL LOW (ref 8.9–10.3)
Chloride: 91 mmol/L — ABNORMAL LOW (ref 98–111)
Creatinine, Ser: 5.19 mg/dL — ABNORMAL HIGH (ref 0.61–1.24)
GFR, Estimated: 11 mL/min — ABNORMAL LOW (ref >60)
Glucose, Bld: 136 mg/dL — ABNORMAL HIGH (ref 70–99)
Potassium: 6 mmol/L — ABNORMAL HIGH (ref 3.5–5.1)
Sodium: 127 mmol/L — ABNORMAL LOW (ref 135–145)

## 2024-03-31 LAB — RASBURICASE - URIC ACID: Uric Acid, Serum: 4.7 mg/dL (ref 3.7–8.6)

## 2024-03-31 LAB — CBC WITH DIFFERENTIAL/PLATELET
Abs Immature Granulocytes: 0.7 K/uL — ABNORMAL HIGH (ref 0.00–0.07)
Basophils Absolute: 0 K/uL (ref 0.0–0.1)
Basophils Relative: 0 %
Eosinophils Absolute: 0 K/uL (ref 0.0–0.5)
Eosinophils Relative: 0 %
HCT: 17.3 % — ABNORMAL LOW (ref 39.0–52.0)
Hemoglobin: 5.1 g/dL — CL (ref 13.0–17.0)
Lymphocytes Relative: 7 %
Lymphs Abs: 2.4 K/uL (ref 0.7–4.0)
MCH: 29.5 pg (ref 26.0–34.0)
MCHC: 29.5 g/dL — ABNORMAL LOW (ref 30.0–36.0)
MCV: 100 fL (ref 80.0–100.0)
Metamyelocytes Relative: 1 %
Monocytes Absolute: 0 K/uL — ABNORMAL LOW (ref 0.1–1.0)
Monocytes Relative: 0 %
Myelocytes: 1 %
Neutro Abs: 30.8 K/uL — ABNORMAL HIGH (ref 1.7–7.7)
Neutrophils Relative %: 91 %
Platelets: 84 K/uL — ABNORMAL LOW (ref 150–400)
RBC: 1.73 MIL/uL — ABNORMAL LOW (ref 4.22–5.81)
RDW: 28.8 % — ABNORMAL HIGH (ref 11.5–15.5)
WBC: 33.9 K/uL — ABNORMAL HIGH (ref 4.0–10.5)
nRBC: 3.3 % — ABNORMAL HIGH (ref 0.0–0.2)

## 2024-03-31 LAB — BODY FLUID CULTURE W GRAM STAIN: Culture: NO GROWTH

## 2024-03-31 LAB — PHOSPHORUS: Phosphorus: 8.7 mg/dL — ABNORMAL HIGH (ref 2.5–4.6)

## 2024-03-31 MED ORDER — ONDANSETRON HCL 4 MG/2ML IJ SOLN: 4.0000 mg | Freq: Four times a day (QID) | INTRAMUSCULAR | Status: DC | PRN

## 2024-03-31 MED ORDER — MORPHINE BOLUS VIA INFUSION: 5.0000 mg | INTRAVENOUS | Status: DC | PRN

## 2024-03-31 MED ORDER — SODIUM CHLORIDE 0.9 % IV SOLN: INTRAVENOUS | Status: DC

## 2024-03-31 MED ORDER — ONDANSETRON 4 MG PO TBDP: 4.0000 mg | ORAL_TABLET | Freq: Four times a day (QID) | ORAL | Status: DC | PRN

## 2024-03-31 MED ORDER — ACETAMINOPHEN 325 MG PO TABS: 650.0000 mg | ORAL_TABLET | Freq: Four times a day (QID) | ORAL | Status: DC | PRN

## 2024-03-31 MED ORDER — GLYCOPYRROLATE 1 MG PO TABS: 1.0000 mg | ORAL_TABLET | ORAL | Status: DC | PRN

## 2024-03-31 MED ORDER — POLYVINYL ALCOHOL 1.4 % OP SOLN: 1.0000 [drp] | Freq: Four times a day (QID) | OPHTHALMIC | Status: DC | PRN

## 2024-03-31 MED ORDER — HALOPERIDOL LACTATE 5 MG/ML IJ SOLN: 2.5000 mg | INTRAMUSCULAR | Status: DC | PRN

## 2024-03-31 MED ORDER — ACETAMINOPHEN 650 MG RE SUPP: 650.0000 mg | Freq: Four times a day (QID) | RECTAL | Status: DC | PRN

## 2024-03-31 MED ORDER — VASOPRESSIN 20 UNITS/100 ML INFUSION FOR SHOCK
INTRAVENOUS | Status: AC
  Administered 2024-03-31: 20 [IU]
  Filled 2024-03-31: qty 100

## 2024-03-31 MED ORDER — HYDROMORPHONE HCL 1 MG/ML IJ SOLN
0.5000 mg | Freq: Once | INTRAMUSCULAR | Status: AC
  Administered 2024-03-31: 0.5 mg via INTRAVENOUS
  Filled 2024-03-31: qty 1

## 2024-03-31 MED ORDER — GLYCOPYRROLATE 0.2 MG/ML IJ SOLN
0.2000 mg | INTRAMUSCULAR | Status: DC | PRN
Start: 2024-03-31 — End: 2024-03-31

## 2024-03-31 MED ORDER — MORPHINE 100MG IN NS 100ML (1MG/ML) PREMIX INFUSION
0.0000 mg/h | INTRAVENOUS | Status: DC
  Filled 2024-03-31: qty 100

## 2024-03-31 MED ORDER — NOREPINEPHRINE 16 MG/250ML-% IV SOLN
0.0000 ug/min | INTRAVENOUS | Status: DC
Start: 2024-03-31 — End: 2024-03-31
  Administered 2024-03-31: 22 ug/min via INTRAVENOUS
  Filled 2024-03-31: qty 250

## 2024-03-31 MED ORDER — GLYCOPYRROLATE 0.2 MG/ML IJ SOLN: 0.2000 mg | INTRAMUSCULAR | Status: DC | PRN

## 2024-04-10 ENCOUNTER — Inpatient Hospital Stay

## 2024-04-10 ENCOUNTER — Inpatient Hospital Stay: Admitting: Hematology and Oncology

## 2024-04-13 NOTE — Progress Notes (Signed)
 eLink Physician-Brief Progress Note Patient Name: Jared [name] DOB: 1945-11-20 MRN: [id number]   Date of Service  April 06, 2024  HPI/Events of Note  10/10 abdominal pain.  did receive Norco around 0430 without relief, distended and uncomfortable appearing clutching his abdomen  eICU Interventions  One-time Dilaudid  0.5 mg for breakthrough     Intervention Category Intermediate Interventions: Pain - evaluation and management  Sonu Kruckenberg 04/06/24, 5:44 AM

## 2024-04-13 NOTE — Progress Notes (Signed)
 TOD 1004. Comfort care measures in place/DNR. Wife and 2 Daughters at bedside at time of death. Dentures to remain with patient. CCM MD notified of TOD. Honor bridge contact, patient is not a candidate for donation.

## 2024-04-13 NOTE — Progress Notes (Signed)
 PHARMACY - ANTICOAGULATION CONSULT NOTE  Pharmacy Consult for IV heparin  Indication: DVT  No Known Allergies  Patient Measurements: Height: 5' 11 (180.3 cm) Weight: 83.8 kg (184 lb 11.9 oz) IBW/kg (Calculated) : 75.3 HEPARIN  DW (KG): 77.4  Vital Signs: Temp: 96.5 F (35.8 C) Apr 17, 2024 0720) Temp Source: Axillary 04/17/24 0720) BP: 90/59 17-Apr-2024 0815) Pulse Rate: 102 2024/04/17 0200)  Labs: Recent Labs    03/28/24 1617 03/28/24 1919 03/29/24 0430 03/29/24 1214 03/30/24 0036 03/30/24 0252 03/30/24 0736 April 17, 2024 0731  HGB 7.1*  --  8.0*  --   --  7.5*  --   --   HCT 23.2*  --  24.8*  --   --  24.1*  --   --   PLT 66*  --  70*  --   --  86*  --   --   HEPARINUNFRC  --    < >  --  0.23* 0.42  --  0.48  --   CREATININE 4.18*  --  4.17* 4.20*  --  4.48*  --  5.19*   < > = values in this interval not displayed.    Estimated Creatinine Clearance: 12.1 mL/min (A) (by C-G formula based on SCr of 5.19 mg/dL (H)).   Medications: No anticoagulants PTA  Assessment: Patient sent from cancer center for elevated WBC found to have new bilateral DVT. Pharmacy consulted to dose heparin .  Today, April 17, 2024 CBC: Hgb and Plt both low. Transfused 11/16 Pt had some bleeding overnight when IV line was removed  Heparin  was paused @ 05:30 this morning due to loss of IV access. D/w CCM - ok to restart.   Goal of Therapy:  Heparin  level 0.3-0.7 units/ml Monitor platelets by anticoagulation protocol: Yes   Plan:  Resume heparin  at 2150 units/hr Check 8 hour heparin  level CBC, heparin  level daily Monitor for signs of bleeding  Ronal CHRISTELLA Rav, PharmD 17-Apr-2024 8:39 AM

## 2024-04-13 NOTE — Progress Notes (Signed)
   Apr 24, 2024 1000  Spiritual Encounters  Type of Visit Initial  Care provided to: [facility] partners present during encounter Nurse  Referral source Nurse (RN/NT/LPN)  Reason for visit Patient death  OnCall Visit No    Chaplain responded to a call for support of family. The patient, Jared [name] had died a few minutes before I arrived. I engaged with family members listening and reflecting with them. They are grieving in a healthy way for them. There are three of his daughters present with his  wife Jared [name] one more is on the way. Before departing I advised them if they wished to have a chaplain later during their stay we would be here to support them.   I updated the patient's nurse and offered her support as well.   Carley [place] [facility] [phone]

## 2024-04-13 NOTE — Progress Notes (Addendum)
  Daily Progress Note   Patient Name: Jared [name]       Date: 04-21-2024 DOB: 1945/10/30  Age: 78 y.o. MRN#: [id number] Attending Physician: Mannam, Praveen, MD Primary Care Physician: [name], FNP Admit Date: 03/26/2024 Length of Stay: 5 days  Reason for Consultation/Follow-up: Establishing goals of care  Subjective:   Reviewed EMR including recent documentation from [facility] provider.  Patient required increasing pressor support overnight.  PCCM provider already spoke with patient's wife this morning and expressed concern that patient is actively dying.  Family has been called to bedside.  Patient has been transitioned to full comfort focused care.  Appropriately started on comfort focused medications.  Discussed care with bedside RN.  Wife present at bedside.  More family coming to bedside.  Patient actively dying.  Appears comfortable.  Patient passed while present at room.   Objective:   Vital Signs:  BP (!) 96/54   Pulse (!) 102   Temp (!) 97.5 F (36.4 C) (Axillary)   Resp (!) 30   Ht 5' 11 (1.803 m)   Wt 83.8 kg   SpO2 92%   BMI 25.77 kg/m   Physical Exam: General: unresponsive, frail, chronically Cardiovascular: Tachycardia noted Respiratory: no increased work of breathing noted, not in respiratory distress Neuro: Unresponsive  Assessment & Plan:   Assessment: Patient is an 78 year old male with a past medical history of hypertension, CKD stage IIIa, HFpEF, pulmonary hypertension, and MDS/CLL who was admitted on 03/26/2024 from oncology clinic for increased abdominal distention in the setting of ascites and was found to have SBP.  Hospital course complicated by mixed septic and cardiogenic shock requiring ICU level of care.  Nephrology consulted for AKI on CKD stage IIIa; patient not a good RRT candidate given significant comorbidities.  Palliative medicine team consulted for complex medical decision making.  Recommendations/Plan: # Complex medical decision  making/goals of care:  - Patient was transition to full comfort focused care today due to patient's worsening medical status.  Patient appropriately deceived symptom management at end-of-life and passed away in the hospital today as anticipated.  -  Code Status: Do not attempt resuscitation (DNR) - Comfort care  # Discharge Planning: Patient passed away in hospital as was anticipated.  Thank you for allowing the palliative care team to participate in the care Jared [name].  Jared Radar, DO Palliative Care Provider PMT # [phone]  If patient remains symptomatic despite maximum doses, please call PMT at [phone] between 0700 and 1900. Outside of these hours, please call attending, as PMT does not have night coverage.

## 2024-04-13 NOTE — IPAL (Signed)
  Interdisciplinary Goals of Care Family Meeting   Date carried out: 14-Apr-2024  Location of the meeting: Bedside  Member's involved: Bedside Registered Nurse and Family Member or next of kin  Durable Power of Attorney or acting medical decision maker: wife, Correy Weidner   Discussion: We discussed goals of care for Jared [name] .  Overnight with increasing pressor requirements.  Now on levo and vasopressin.  His breathing is more labored today and his oxygen requirement has increased overnight into this morning.  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  He was made DNR/DNI yesterday and palliative care came to speak with him.  Given that he continues to decline, not dialysis candidate, not MCS or inotrope candidate and we have been treating his infections and shock without improvement, I discussed comfort measures with him and his wife.  He has limited interaction this morning but is able to nod at me.  Recommended that we move to comfort measures with liberalized visitation as I feel that he is actively dying this morning.  Wife is in agreement.  She is calling children to come in.  Will continue vasopressor support until family is able to be here.  Code status:   Code Status: Do not attempt resuscitation (DNR) - Comfort care   Disposition: In-patient comfort care  Time spent for the meeting: 20  Tinnie FORBES Furth, PA-C [facility] 04-14-24 8:56 AM  Please see [email or web address] for pager details.  From 7A-7P if no response, please call [phone] After hours, please call ELink [phone]

## 2024-04-13 NOTE — Death Summary Note (Signed)
 DEATH SUMMARY   Patient Details  Name: Jared DIIORIO MRN: [id number] DOB: Jul 02, 1945  Admission/Discharge Information   Admit Date:  04-25-24  Date of Death: Date of Death: 2024/04/30  Time of Death: Time of Death: 1004  Length of Stay: 5  Referring Physician: [name], FNP   Reason(s) for Hospitalization  Spontaneous bacterial peritonitis   Diagnoses  Preliminary cause of death: septic shock 2/2 spontaneous bacterial peritonitis with cardiac, respiratory and renal failure  Secondary Diagnoses (including complications and co-morbidities):  Principal Problem:   Leukocytosis (leucocytosis) Active Problems:   CLL (chronic lymphocytic leukemia) (HCC)   Other ascites   Generalized abdominal pain   SBP (spontaneous bacterial peritonitis) (HCC)   Pneumonia of right upper lobe due to infectious organism   Pressure injury of skin   Palliative care encounter   Goals of care, counseling/discussion   End of life care   Brief Hospital Course (including significant findings, care, treatment, and services provided and events leading to death)  Jared [name] is a 78 y.o. 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   HPI: Patient presented to ED 04-25-2024 sent from heme-onc clinic for an elevated white count and neutrophils concerning for infection.  Patient appears dehydrated, has scleral icterus, abdominal distention consistent with ascites, abdomen is not taut but soft patient does have pressure on exam but no guarding organomegaly or tenderness.   ED Course:  Initial CBC shows white count of 55.3 hemoglobin of 9.4 platelets of 113. Initial CMP  shows BUN of 51 CKD with a creatinine of 2.68 alk phos of 397, albumin of 3.0, AST of 79, total protein of 5.2, total bili of 5.5, LDH of 1113. Liver/biliary ultrasound ordered showing questionable cirrhosis and moderate ascites. CT C/A/P shows bibasilar consolidations, massive ascites in abdomen, splenomegaly.   Hospital Course: Underwent US  duplex LE and was found to have DVT in left posterior tibial vein and left peroneal vein V/Q scan negative for VTE Started on broad spectrum antimicrobials including Vancomycin and Zosyn De-escalated to Ceftraixone on 11/15 Underwent IR paracentesis and drained 1.9 L ascitic fluid, BP started to deteriorate after that Given albumin and midodrine for hypotension Rapid response called on 11/15 due to hypotension, brought to ICU for pressor support.   During his ICU stay, patient remained on antibiotics with Zosyn and Linezolid. He had rapid reaccumulation of ascites but due to hypotension requiring vasopressor support, decision to hold off on IR drainage again. Additionally, we attempted diuresis given his RV failure, AKI, however his renal function worsened and this was held. On 11/17 he had worsening shock with increasing levophed requirements. We started him on stress dose steroids, and PICC line was placed. Team and family had GOC discussion with patient and he was made DNR/DNI. On 30-Apr-2024 on morning assessment he appeared very tired with increased and irregular WOB concerning active dying process. Vasopressor requirements still increasing and on vasopressin and levophed. With limited options, we had ongoing GOC discussion and wife/patient in agreement to make him comfortable. He was given morphine, visitation was liberalized and patient passed peacefully at 89 with family at bedside. They were visited by chaplaincy services.    Pertinent Labs and Studies  Significant Diagnostic Studies DG CHEST PORT 1 VIEW Result Date: 04-08-24 CLINICAL DATA:  Central line  placement. EXAM: PORTABLE CHEST 1 VIEW COMPARISON:  03/27/2024 FINDINGS: Interval placement of right-sided PICC line with tip at the level of the cavoatrial junction. Lungs are hypoinflated demonstrate persistent left base/retrocardiac opacification possibly small effusion with associated basilar atelectasis. Infection in the left base is possible. Right lung is clear. Cardiomediastinal silhouette and remainder of the exam is unchanged. IMPRESSION: 1. Persistent left base/retrocardiac opacification likely small effusion with associated basilar atelectasis. Infection in the left base is possible. 2. Right-sided PICC line with tip at the level of the cavoatrial junction. Electronically Signed   By: Toribio Agreste M.D.   On: 2024-04-08 08:29   US  EKG SITE RITE Result Date: 03/30/2024 If Site Rite image not attached, placement could not be confirmed due to current cardiac rhythm.  ECHOCARDIOGRAM COMPLETE Result Date: 03/29/2024    ECHOCARDIOGRAM REPORT   Patient Name:   Jared [name] Date of Exam: 03/29/2024 Medical Rec #:  [id number]     Height:       71.0 in Accession #:    [id number]    Weight:       172.4 lb Date of Birth:  1946-03-12     BSA:          1.980 m Patient Age:    78 years      BP:           107/63 mmHg Patient Gender: M             HR:           113 bpm. Exam Location:  Inpatient Procedure: 2D Echo (Both Spectral and Color Flow Doppler were utilized during            procedure). Indications:    Pulmonary Hypertension  History:        Patient has prior history of Echocardiogram examinations. CHF;                 Risk Factors:Hypertension.  Sonographer:    Charmaine Gaskins Referring Phys: [id number] PAULA SOUTHERLY IMPRESSIONS  1. Left ventricular ejection fraction, by estimation, is 65 to 70%. The left ventricle has normal function. The left ventricle has no regional wall motion abnormalities. There is moderate left ventricular hypertrophy. Left ventricular diastolic parameters are indeterminate. There  is the interventricular septum is flattened in systole and diastole, consistent with right ventricular pressure and volume overload.  2. Right ventricular systolic function is normal. The right ventricular size is moderately enlarged. There is severely elevated pulmonary artery systolic pressure. The estimated right ventricular systolic pressure is 80.0 mmHg.  3. Right atrial size was severely dilated.  4. A small pericardial effusion is present.  5. The mitral valve is normal in structure. Mild to moderate mitral valve regurgitation.  6. The tricuspid valve is abnormal. Tricuspid valve regurgitation is severe.  7. The aortic valve is tricuspid. Aortic valve regurgitation is trivial. Aortic valve sclerosis is present, with no evidence of aortic valve stenosis.  8. Aortic dilatation noted. There is dilatation of the ascending aorta, measuring 40 mm.  9. The inferior vena cava is dilated in size with <50% respiratory variability, suggesting right atrial pressure of 15 mmHg. FINDINGS  Left Ventricle: Left ventricular ejection fraction, by estimation, is 65 to 70%. The left ventricle has normal function. The left ventricle has no regional wall motion abnormalities. The left ventricular internal cavity size was normal in size. There is  moderate left ventricular hypertrophy. The interventricular septum is flattened in systole and diastole, consistent with right ventricular pressure and volume overload. Left ventricular diastolic parameters are indeterminate. Right Ventricle: The right ventricular size is moderately enlarged. No increase in right ventricular wall thickness. Right ventricular systolic function is normal. There is severely elevated pulmonary artery systolic pressure. The tricuspid regurgitant velocity is 4.03 m/s, and with an assumed right atrial pressure of 15 mmHg, the estimated right ventricular systolic pressure is 80.0 mmHg. Left Atrium: Left atrial size was normal in size. Right Atrium: Right atrial  size was severely dilated. Pericardium: A small pericardial effusion is present. Mitral Valve: The mitral valve is normal in structure. Mild to moderate mitral valve regurgitation. Tricuspid Valve: The tricuspid valve is abnormal. Tricuspid valve regurgitation is severe. Aortic Valve: The aortic valve is tricuspid. Aortic valve regurgitation is trivial. Aortic valve sclerosis is present, with no evidence of aortic valve stenosis. Pulmonic Valve: The pulmonic valve was grossly normal. Pulmonic valve regurgitation is trivial. Aorta: The aortic root is normal in size and structure and aortic dilatation noted. There is dilatation of the ascending aorta, measuring 40 mm. Venous: The inferior vena cava is dilated in size with less than 50% respiratory variability, suggesting right atrial pressure of 15 mmHg. IAS/Shunts: The interatrial septum was not well visualized.  LEFT VENTRICLE PLAX 2D LVIDd:         3.90 cm     Diastology LVIDs:         2.50 cm     LV e' medial:    7.40 cm/s LV PW:         1.30 cm     LV E/e' medial:  12.6 LV IVS:        1.40 cm     LV e' lateral:   9.36 cm/s LVOT diam:     2.00 cm     LV E/e' lateral: 10.0 LVOT Area:     3.14 cm  LV Volumes (MOD) LV vol d, MOD A2C: 76.4 ml LV vol d, MOD A4C: 60.3 ml LV vol s, MOD A2C: 24.7 ml LV vol s, MOD A4C: 22.3 ml LV SV MOD A2C:     51.7 ml LV SV MOD A4C:     60.3 ml LV SV MOD BP:      47.1 ml RIGHT VENTRICLE RV Basal diam:  4.30 cm RV Mid diam:    4.20 cm RV S prime:     12.20 cm/s TAPSE (M-mode): 2.0 cm RVSP:           80.0 mmHg LEFT ATRIUM             Index        RIGHT ATRIUM            Index LA diam:        3.50 cm 1.77 cm/m   RA Pressure: 15.00 mmHg LA Vol (A2C):   66.7 ml 33.69 ml/m  RA Area:     29.10 cm LA Vol (A4C):   66.0 ml 33.34 ml/m  RA Volume:   110.00 ml  55.57 ml/m LA Biplane Vol: 70.0 ml 35.36 ml/m   AORTA Ao Root diam: 3.70 cm Ao Asc diam:  3.95 cm MITRAL VALVE                  TRICUSPID VALVE MV Area (PHT): 4.68 cm       TR Peak  grad:   65.0 mmHg MV Decel Time: 162 msec  TR Vmax:        403.00 cm/s MR Peak grad:    87.6 mmHg    Estimated RAP:  15.00 mmHg MR Mean grad:    64.0 mmHg    RVSP:           80.0 mmHg MR Vmax:         468.00 cm/s MR Vmean:        379.0 cm/s   SHUNTS MR PISA:         2.26 cm     Systemic Diam: 2.00 cm MR PISA Eff ROA: 19 mm MR PISA Radius:  0.60 cm MV E velocity: 93.40 cm/s MV A velocity: 116.00 cm/s MV E/A ratio:  0.81 Lonni Nanas MD Electronically signed by Lonni Nanas MD Signature Date/Time: 03/29/2024/1:58:20 PM    Final    US  Paracentesis Result Date: 03/28/2024 INDICATION: Inpatient with CLL, CKD 3a with concern for peritonitis. Recent imaging demonstrates ascites. EXAM: ULTRASOUND GUIDED DIAGNOSTIC AND THERAPEUTIC PARACENTESIS MEDICATIONS: 10 mL 1% lidocaine  COMPLICATIONS: None immediate. PROCEDURE: Informed written consent was obtained from the patient after a discussion of the risks, benefits and alternatives to treatment. A timeout was performed prior to the initiation of the procedure. Initial ultrasound scanning demonstrates a moderate amount of ascites within the right lower abdominal quadrant. The right lower abdomen was prepped and draped in the usual sterile fashion. 1% lidocaine  was used for local anesthesia. Following this, a 19 gauge, 7-cm, Yueh catheter was introduced. An ultrasound image was saved for documentation purposes. The paracentesis was performed. The paracentesis was stopped prior to complete drainage of fluid due to concern for hypotension. The catheter was removed and a dressing was applied. The patient tolerated the procedure well without immediate post procedural complication. FINDINGS: A total of approximately 1.9 liters of dark amber fluid was removed. Samples were sent to the laboratory as requested by the clinical team. IMPRESSION: Successful ultrasound-guided paracentesis yielding 1.9 liters of peritoneal fluid. Performed by Laymon Coast, NP  under the supervision of Dr. Luverne PLAN: If the patient eventually requires >/=2 paracenteses in a 30 day period, candidacy for formal evaluation by the [facility] Hypertension [facility] will be assessed. Electronically Signed   By: Marcey Luverne M.D.   On: 03/28/2024 09:00   DG CHEST PORT 1 VIEW Result Date: 03/27/2024 EXAM: 1 VIEW(S) XRAY OF THE CHEST 03/27/2024 05:53:00 PM COMPARISON: Chest abdomen and pelvis CT yesterday at 11:26 am. Portable chest yesterday at 11:11 am. CLINICAL HISTORY: [id number] Seen in radiology department [phone] Seen in radiology department [phone] FINDINGS: LUNGS AND PLEURA: Moderate slightly increased left layering pleural effusion is noted, small unchanged right pleural effusion. There is overlying consolidation or atelectasis in the left base. No other focal lung infiltrate is seen with low lung volumes. No pneumothorax. HEART AND MEDIASTINUM: Mild cardiomegaly. No vascular congestion is seen. The aorta is tortuous with a stable mediastinum and scattered calcific plaques. BONES AND SOFT TISSUES: Degenerative change thoracic spine with slight dextroscoliosis. IMPRESSION: 1. Moderate, slightly increased left pleural effusion with small, unchanged right pleural effusion. 2. Left basilar consolidation or atelectasis as before. otherwise clear hypoinflated lungs . 3. Mild cardiomegaly. Electronically signed by: Francis Quam MD 03/27/2024 08:18 PM EST RP Workstation: [id number]   VAS US  LOWER EXTREMITY VENOUS (DVT) Result Date: 03/27/2024  Lower Venous DVT Study Patient Name:  PAWAN KNECHTEL  Date of Exam:   03/27/2024 Medical Rec #: [id number]      Accession #:    [id number]  Date of Birth: 05/31/1945      Patient Gender: M Patient Age:   31 years Exam Location:  [facility] Procedure:      VAS US  LOWER EXTREMITY VENOUS (DVT) Referring Phys: ELSIE MONTCLAIR --------------------------------------------------------------------------------   Indications: D-Dimer >20. Ascites, abdominal distention.  Risk Factors: Myelodysplastic Syndromes/Chronic lymphocytic leukemia diagnosed 2025 Limitations: Edema. Positioning. Comparison Study: No prior study on file Performing Technologist: Alberta Lis RVS  Examination Guidelines: A complete evaluation includes B-mode imaging, spectral Doppler, color Doppler, and power Doppler as needed of all accessible portions of each vessel. Bilateral testing is considered an integral part of a complete examination. Limited examinations for reoccurring indications may be performed as noted. The reflux portion of the exam is performed with the patient in reverse Trendelenburg.  +---------+---------------+---------+-----------+----------+-------------------+ RIGHT    CompressibilityPhasicitySpontaneityPropertiesThrombus Aging      +---------+---------------+---------+-----------+----------+-------------------+ CFV      Full           Yes      Yes                                      +---------+---------------+---------+-----------+----------+-------------------+ SFJ      Full                                                             +---------+---------------+---------+-----------+----------+-------------------+ FV Prox  Full           Yes      No                                       +---------+---------------+---------+-----------+----------+-------------------+ FV Mid   Full                                                             +---------+---------------+---------+-----------+----------+-------------------+ FV DistalFull                                                             +---------+---------------+---------+-----------+----------+-------------------+ PFV      Full           Yes      Yes                                      +---------+---------------+---------+-----------+----------+-------------------+ POP      Full           Yes      Yes                                       +---------+---------------+---------+-----------+----------+-------------------+ PTV      None  Acute               +---------+---------------+---------+-----------+----------+-------------------+ PERO                                                  Not well visualized +---------+---------------+---------+-----------+----------+-------------------+   +---------+---------------+---------+-----------+----------+-------------------+ LEFT     CompressibilityPhasicitySpontaneityPropertiesThrombus Aging      +---------+---------------+---------+-----------+----------+-------------------+ CFV      Full           Yes      Yes                                      +---------+---------------+---------+-----------+----------+-------------------+ SFJ      Full                                                             +---------+---------------+---------+-----------+----------+-------------------+ FV Prox  Full           Yes      Yes                                      +---------+---------------+---------+-----------+----------+-------------------+ FV Mid   Full                                                             +---------+---------------+---------+-----------+----------+-------------------+ FV DistalFull                                                             +---------+---------------+---------+-----------+----------+-------------------+ PFV      Full           Yes      Yes                                      +---------+---------------+---------+-----------+----------+-------------------+ POP                     Yes      Yes                  patent by color and                                                       Doppler             +---------+---------------+---------+-----------+----------+-------------------+ PTV      None  Acute               +---------+---------------+---------+-----------+----------+-------------------+ PERO     None                                         Acute               +---------+---------------+---------+-----------+----------+-------------------+     Summary: RIGHT: - Findings consistent with acute deep vein thrombosis involving the right posterior tibial veins.  Significant interstitial edema noted throughout the extremity  LEFT: - Findings consistent with acute deep vein thrombosis involving the left posterior tibial veins, and left peroneal veins.  Significant interstitial edema noted throughout the extremity.  *See table(s) above for measurements and observations. Electronically signed by Penne Colorado MD on 03/27/2024 at 6:03:26 PM.    Final    NM Pulmonary Perfusion Result Date: 03/27/2024 CLINICAL DATA:  Pulmonary embolism (PE) suspected, high prob EXAM: NUCLEAR MEDICINE PERFUSION LUNG SCAN TECHNIQUE: Perfusion images were obtained in multiple projections after intravenous injection of radiopharmaceutical. No ventilation imaging performed. RADIOPHARMACEUTICALS:  4.18 mCi Tc-67m MAA IV COMPARISON:  Chest radiographs and chest CT 03/26/2024. FINDINGS: There are no wedge-shaped perfusion defects to suggest acute pulmonary embolism. Left pleural effusion noted, similar to recent prior studies. IMPRESSION: No evidence of acute pulmonary embolism on perfusion scintigraphy by PISAPED criteria. Grossly stable small left pleural effusion. Electronically Signed   By: Elsie Perone M.D.   On: 03/27/2024 16:57   US  Abdomen Limited RUQ (LIVER/GB) Result Date: 03/26/2024 CLINICAL DATA:  Abdominal pain. EXAM: ULTRASOUND ABDOMEN LIMITED RIGHT UPPER QUADRANT COMPARISON:  CT dated 03/26/2024. FINDINGS: Gallbladder: There is stone and layering sludge within the gallbladder. The gallbladder wall is mildly thickened measuring 5 mm in thickness, possibly related to ascites. Negative sonographic Murphy's  sign. Common bile duct: Diameter: 8 mm Liver: The liver demonstrates a normal echogenicity. There is apparent minimal irregularity of the liver contour which may represent changes of cirrhosis. Clinical correlation recommended. Portal vein is patent on color Doppler imaging with normal direction of blood flow towards the liver. Other: Moderate ascites noted. IMPRESSION: 1. Gallbladder stone and sludge. No sonographic evidence of acute cholecystitis. 2. Moderate ascites. 3. Questionable cirrhosis.  Clinical correlation is recommended. Electronically Signed   By: Vanetta Chou M.D.   On: 03/26/2024 14:03   CT CHEST ABDOMEN PELVIS WO CONTRAST Result Date: 03/26/2024 EXAM: CT CHEST, ABDOMEN AND PELVIS WITHOUT CONTRAST 03/26/2024 11:41:11 AM TECHNIQUE: CT of the chest, abdomen and pelvis was performed without the administration of intravenous contrast. Multiplanar reformatted images are provided for review. Automated exposure control, iterative reconstruction, and/or weight based adjustment of the mA/kV was utilized to reduce the radiation dose to as low as reasonably achievable. COMPARISON: CT of the abdomen and pelvis dated 03/01/2024. CLINICAL HISTORY: History of hemorrhagic gastritis. Distended abdomen. Likely ascites. Increased bilirubin level. FINDINGS: CHEST: MEDIASTINUM AND LYMPH NODES: There is moderate calcific coronary artery disease present. Pericardium is unremarkable. The central airways are clear. No mediastinal, hilar or axillary lymphadenopathy. There is moderate calcific atheromatous disease within the thoracic aorta. The esophagus is unremarkable. LUNGS AND PLEURA: There is a small patchy, reticular area of opacification/consolidation present anteriorly within the right upper lobe underlying the right first costosternal joint. There are patchy, streaky bronchovascular opacities present within the lung bases bilaterally. There is improved aeration of the lower lobes relative to the previous  study. The  left-sided pleural effusion has mildly worsened in the interim. No pneumothorax. ABDOMEN AND PELVIS: LIVER: The liver is unremarkable. GALLBLADDER AND BILE DUCTS: There is radiopaque material laying independently within the gallbladder. No biliary ductal dilatation. SPLEEN: The spleen is enlarged. PANCREAS: No acute abnormality. ADRENAL GLANDS: No acute abnormality. KIDNEYS, URETERS AND BLADDER: No stones in the kidneys or ureters. No hydronephrosis. No perinephric or periureteral stranding. Urinary bladder is unremarkable. GI AND BOWEL: Stomach demonstrates no acute abnormality. There is no bowel obstruction. REPRODUCTIVE ORGANS: No acute abnormality. PERITONEUM AND RETROPERITONEUM: There is moderate ascites present. No free air. VASCULATURE: Aorta is normal in caliber. ABDOMINAL AND PELVIS LYMPH NODES: No lymphadenopathy. BONES AND SOFT TISSUES: The patient is status post left total hip arthroplasty. There are advanced degenerative changes within the lower lumbar spine. The patient is status post decompression laminectomies. No acute osseous abnormality. No focal soft tissue abnormality. IMPRESSION: 1. Small patchy reticular opacification/consolidation in the right upper lobe and patchy streaky bronchovascular opacities at the lung bases bilaterally; aeration of the lower lobes has improved compared to 03/01/24 2. Mildly worsened left pleural effusion 3. Moderate ascites 4. Splenomegaly 5. Radiopaque material within the gallbladder, compatible with gallstones 6. Moderate calcific coronary artery disease and moderate calcific atheromatous disease of the thoracic aorta Electronically signed by: Evalene Coho MD 03/26/2024 12:18 PM EST RP Workstation: [id number]   DG Chest Port 1 View Result Date: 03/26/2024 CLINICAL DATA:  Possible sepsis. EXAM: PORTABLE CHEST 1 VIEW COMPARISON:  09/20/2017 FINDINGS: Lungs are hypoinflated with bibasilar opacification left worse than right likely small layering  left effusion with bibasilar atelectasis. Infection in the lung bases is possible. Cardiomediastinal silhouette and remainder of the exam is unchanged. IMPRESSION: Hypoinflation with bibasilar opacification left worse than right likely small layering left effusion with bibasilar atelectasis. Infection in the lung bases is possible. Electronically Signed   By: Toribio Agreste M.D.   On: 03/26/2024 11:42   DG Abd 1 View Result Date: 03/04/2024 CLINICAL DATA:  Abdominal distension. EXAM: ABDOMEN - 1 VIEW COMPARISON:  Radiograph dated 02/27/2024. FINDINGS: No bowel dilatation or evidence of obstruction. Air and contrast noted in the colon. Degenerative changes spine. Total left hip arthroplasty. No acute osseous pathology. IMPRESSION: Nonobstructive bowel gas pattern. Electronically Signed   By: Vanetta Chou M.D.   On: 03/04/2024 18:35   US  RENAL Result Date: 03/03/2024 CLINICAL DATA:  Renal failure. EXAM: RENAL / URINARY TRACT ULTRASOUND COMPLETE COMPARISON:  None Available. FINDINGS: Right Kidney: Renal measurements: 10.0 cm x 5.3 cm x 5.2 cm = volume: 142.6 mL. There is diffusely increased echogenicity of the renal parenchyma. A 2.3 cm x 2.1 cm x 1.6 cm simple right renal cyst is seen within the mid right kidney. No hydronephrosis is visualized. Left Kidney: Renal measurements: 10.3 cm x 4.6 cm x 5.1 cm = volume: 125.1 mL. There is diffusely increased echogenicity of the renal parenchyma. A 1.9 cm x 1.7 cm x 1.6 cm simple left renal cyst is seen with the mid to lower left kidney. No hydronephrosis is visualized. Bladder: Appears normal for degree of bladder distention. The bilateral ureteral jets are not visualized peer Other: There is a moderate amount of ascites. IMPRESSION: 1. Bilateral echogenic kidneys which likely represents sequelae associated with medical renal disease. 2. Bilateral simple renal cysts. 3. Ascites. Electronically Signed   By: Suzen Dials M.D.   On: 03/03/2024 18:03   CT  ABDOMEN PELVIS WO CONTRAST Result Date: 03/01/2024 CLINICAL DATA:  Abdominal distension. EXAM: CT ABDOMEN  AND PELVIS WITHOUT CONTRAST TECHNIQUE: Multidetector CT imaging of the abdomen and pelvis was performed following the standard protocol without IV contrast. RADIATION DOSE REDUCTION: This exam was performed according to the departmental dose-optimization program which includes automated exposure control, adjustment of the mA and/or kV according to patient size and/or use of iterative reconstruction technique. COMPARISON:  CT abdomen pelvis dated 02/24/2024. FINDINGS: Evaluation of this exam is limited in the absence of intravenous contrast as well as due to respiratory motion. Evaluation is also limited due to anasarca and streak artifact caused by left hip arthroplasty. Lower chest: Small bilateral pleural effusions. Large areas of consolidative changes involving the lower lobes bilaterally may represent atelectasis or pneumonia and progressed since the prior CT. No intra-abdominal free air. Diffuse mesenteric edema and small ascites. Hepatobiliary: The liver is grossly unremarkable. No biliary dilatation. Gallstone. Vicarious excretion of contrast also noted in the gallbladder. Pancreas: Unremarkable. No pancreatic ductal dilatation or surrounding inflammatory changes. Spleen: Normal in size without focal abnormality. Adrenals/Urinary Tract: The adrenal glands unremarkable. There is no hydronephrosis or nephrolithiasis on either side. Bilateral perinephric stranding, nonspecific. The urinary bladder is suboptimally evaluated due to streak artifact caused by left hip arthroplasty. Stomach/Bowel: Oral contrast noted throughout the bowel and colon. There is sigmoid diverticulosis. No bowel obstruction. Normal appendix. Vascular/Lymphatic: Moderate aortoiliac atherosclerotic disease. The IVC is grossly unremarkable. No portal venous gas. No obvious adenopathy. Reproductive: The prostate gland is poorly  visualized. Other: Diffuse mesenteric and subcutaneous edema and anasarca, worsened since the prior CT. Musculoskeletal: Osteopenia with degenerative changes. Total left hip arthroplasty. No acute osseous pathology. IMPRESSION: 1. No bowel obstruction. Normal appendix. 2. Sigmoid diverticulosis. 3. Cholelithiasis. 4. Small bilateral pleural effusions with large areas of consolidative changes involving the lower lobes bilaterally may represent atelectasis or pneumonia and progressed since the prior CT. 5. Anasarca, worsened since the prior CT. 6.  Aortic Atherosclerosis (ICD10-I70.0). Electronically Signed   By: Vanetta Chou M.D.   On: 03/01/2024 15:51    Microbiology Recent Results (from the past 240 hours)  Blood Culture (routine x 2)     Status: None   Collection Time: 03/26/24 10:44 AM   Specimen: BLOOD  Result Value Ref Range Status   Specimen Description   Final    BLOOD BLOOD RIGHT ARM Performed at [facility], [address] [place], [place] [address]    Special Requests   Final    BOTTLES DRAWN AEROBIC AND ANAEROBIC Blood Culture adequate volume Performed at [facility], [address] [place], [place] [address]    Culture   Final    NO GROWTH 5 DAYS Performed at [facility], [address], [place], [place] [address]    Report Status 2024-04-13 FINAL  Final  Blood Culture (routine x 2)     Status: None   Collection Time: 03/26/24 10:58 AM   Specimen: BLOOD  Result Value Ref Range Status   Specimen Description   Final    BLOOD BLOOD LEFT ARM Performed at [facility], [address] [place], [place] [address]    Special Requests   Final    BOTTLES DRAWN AEROBIC AND ANAEROBIC Blood Culture adequate volume Performed at [facility], [address] [place], [place] [address]    Culture   Final    NO GROWTH 5 DAYS Performed at [facility], [address] [place], [place]  [address]    Report Status 04/13/2024 FINAL  Final  Body fluid culture w Gram Stain     Status: None   Collection Time: 03/28/24  8:42 AM   Specimen: PATH Cytology Peritoneal fluid  Result Value Ref Range Status   Specimen Description   Final    PERITONEAL Performed at [facility], [address], [place], [place] [address]    Special Requests   Final    NONE Performed at [facility], [address], [place], [place] [address]    Gram Stain   Final    MODERATE WBC PRESENT, PREDOMINANTLY PMN NO ORGANISMS SEEN    Culture   Final    NO GROWTH 3 DAYS Performed at [facility], [address], [place], [place] [address]    Report Status Apr 17, 2024 FINAL  Final  MRSA Next Gen by PCR, Nasal     Status: None   Collection Time: 03/28/24  4:36 PM   Specimen: Nasal Mucosa; Nasal Swab  Result Value Ref Range Status   MRSA by PCR Next Gen NOT DETECTED NOT DETECTED Final    Comment: (NOTE) The GeneXpert MRSA Assay (FDA approved for NASAL specimens only), is one component of a comprehensive MRSA colonization surveillance program. It is not intended to diagnose MRSA infection nor to guide or monitor treatment for MRSA infections. Test performance is not FDA approved in patients less than 19 years old. Performed at [facility], [address] [place], [place] [address]     Lab Basic Metabolic Panel: Recent Labs  Lab 03/26/24 1103 03/27/24 0409 03/28/24 0225 03/28/24 1617 03/29/24 0430 03/29/24 1214 03/30/24 0252 04-17-24 0731  NA  --  139   < > 137 135 135 134* 127*  K  --  4.0   < > 4.6 4.4 4.4 4.5 6.0*  CL  --  102   < > 101 100 99 97* 91*  CO2  --  19*   < > 18* 18* 17* 17* 10*  GLUCOSE  --  92   < > 129* 137* 112* 97 136*  BUN  --  47*   < > 63* 67* 68* 70* 80*  CREATININE  --  2.74*   < > 4.18* 4.17* 4.20* 4.48* 5.19*  CALCIUM  --  8.5*   < > 7.7* 7.5* 7.4* 7.2* 6.7*  MG  --  2.2  --   --   --  2.1  --   --    PHOS 4.3  --   --   --   --   --   --  8.7*   < > = values in this interval not displayed.   Liver Function Tests: Recent Labs  Lab 03/26/24 0909 03/26/24 1044 03/27/24 0409 03/27/24 1513 03/28/24 1120 03/28/24 1617 03/29/24 0430  AST 79* 108* 96*  --   --  91* 96*  ALT 32 42 33  --   --  30 31  ALKPHOS 397* 516* 465*  --   --  492* 524*  BILITOT 5.5* 4.9* 4.3*  --   --  4.2* 4.8*  PROT 5.2* 5.9* 5.4*  --   --  5.2* 5.3*  ALBUMIN 3.0* 3.3* 3.0* 2.9* 2.7* 2.8* 2.9*   Recent Labs  Lab 03/26/24 1044  LIPASE 77*   Recent Labs  Lab 03/26/24 1044  AMMONIA 25   CBC: Recent Labs  Lab 03/26/24 1044 03/26/24 1047 03/28/24 0225 03/28/24 1617 03/29/24 0430 03/30/24 0252 04-17-24 0853  WBC 54.5*   < >  62.6* 55.9* 61.0* 55.6* 33.9*  NEUTROABS 51.2*  --  53.3* 48.7*  --  44.8* 30.8*  HGB 9.6*   < > 8.0* 7.1* 8.0* 7.5* 5.1*  HCT 31.4*   < > 26.0* 23.2* 24.8* 24.1* 17.3*  MCV 97.8   < > 95.9 97.5 92.2 94.9 100.0  PLT 115*   < > 79* 66* 70* 86* 84*   < > = values in this interval not displayed.   Cardiac Enzymes: No results for input(s): CKTOTAL, CKMB, CKMBINDEX, TROPONINI in the last 168 hours. Sepsis Labs: Recent Labs  Lab 03/26/24 1046 03/26/24 1351 03/27/24 0409 03/28/24 0225 03/28/24 1617 03/28/24 1923 03/29/24 0430 03/30/24 0252 04-23-24 0853  PROCALCITON  --   --  3.13  --   --   --   --   --   --   WBC  --   --  49.8*   < > 55.9*  --  61.0* 55.6* 33.9*  LATICACIDVEN 1.3 1.4  --   --  1.9 1.8  --   --   --    < > = values in this interval not displayed.    Procedures/Operations  Imaging with CT, abdomina XR, CXR, US  abdomen RUQ, US  paracentesis.  IR paracentesis  Upper endoscopy  PICC line placement  Echocardiogram   Tinnie FORBES Furth, PA-C [facility] 2024/04/23 11:35 AM  Please see [email or web address] for pager details.  From 7A-7P if no response, please call [phone] After hours, please call ELink [phone]

## 2024-04-13 NOTE — Plan of Care (Signed)
  Problem: Education: Goal: Knowledge of General Education information will improve Description: Including pain rating scale, medication(s)/side effects and non-pharmacologic comfort measures Outcome: Progressing   Problem: Clinical Measurements: Goal: Diagnostic test results will improve Outcome: Progressing Goal: Respiratory complications will improve Outcome: Progressing Goal: Cardiovascular complication will be avoided Outcome: Progressing   Problem: Nutrition: Goal: Adequate nutrition will be maintained Outcome: Progressing   Problem: Elimination: Goal: Will not experience complications related to urinary retention Outcome: Progressing

## 2024-04-13 NOTE — Progress Notes (Signed)
 Called to assess R arm PICC. RUA with 4+ edema at insertion site of PICC. GBR. PIV started to LUA to resume IVF's until patency of PICC and reason for edema is ascertained. Attempted to place 2nd PIV and found veins to be very sclerosed and impenetrable by IV catheters to LAFA and LUA. Primary RN notified.

## 2024-04-13 NOTE — Progress Notes (Signed)
 NAME:  Jared [name], MRN:  [id number], DOB:  May 11, 1946, LOS: 5 ADMISSION DATE:  03/26/2024, CONSULTATION DATE:  @TODAY @ REFERRING MD:  Dr. 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  HPI: presented to ED 02/24/24 sent from heme-onc clinic for an elevated white count and neutrophils concerning for infection.  Patient appears dehydrated, has scleral icterus, abdominal distention consistent with ascites, abdomen is not taut but soft patient does have pressure on exam but no guarding organomegaly or tenderness.   ED Course:  Initial CBC shows white count of 55.3 hemoglobin of 9.4 platelets of 113. Initial CMP shows BUN of 51 CKD with a creatinine of 2.68 alk phos of 397, albumin of 3.0, AST of 79, total protein of 5.2, total bili of 5.5, LDH of 1113. Liver/biliary ultrasound ordered showing questionable cirrhosis and moderate ascites. CT C/A/P shows bibasilar consolidations, massive ascites in abdomen, splenomegaly.  Hospital Course: Underwent US  duplex LE and was found to have DVT in left posterior tibial vein and left peroneal vein V/Q scan negative for VTE Started on broad spectrum antimicrobials including Vancomycin and Zosyn De-escalated to Ceftraixone on 11/15 Underwent IR paracentesis and drained 1.9 L ascitic fluid, BP started to deteriorate after that Given albumin and midodrine for hypotension Rapid response called on 11/15 due to hypotension, brought to ICU for pressor support.  Pertinent  Medical History   Past Medical History:  Diagnosis Date   Arthritis    knees and  back   Enlarged prostate    takes Flomax    Headache    Hypertension    stress test done about 12 yrs ago - results wnl per pt.   Significant Hospital Events: Including procedures, antibiotic start and stop dates in addition to other pertinent events   03/28/24 --> brought down to ICU for shock, started on levophed. 03/29/24 --> remains on levophed 2024/04/18: comfort   Interim History / Subjective:  More lethargic than yesterday.  On increasing vasopressor and oxygen requirements.  Discussion with wife, CI pal.  Moving toward comfort measures.  Waiting on children to arrive. Objective    Blood pressure (!) 90/59, pulse (!) 102, temperature (!) 96.5 F (35.8 C), temperature source Axillary, resp. rate 20, height 5' 11 (1.803 m), weight 83.8 kg, SpO2 94%.        Intake/Output Summary (Last 24 hours) at 18-Apr-2024 0856 Last data filed at 2024-04-18 9346 Gross per 24 hour  Intake 2359.61 ml  Output 325 ml  Net 2034.61 ml   Filed Weights   03/29/24 0500 03/30/24 0500 2024/04/18 0500  Weight: 78.2 kg 78.2 kg 83.8 kg   Examination: General: Elderly, frail, acute on chronically ill-appearing HEENT: PERRLA, pale conjunctivo-, mucous membranes are dry, temporal wasting Lungs: Diminished right greater than left, 6 L nasal cannula, breathing is moderately labored Cardiovascular: S1-S2, bilateral lower extremity pitting edema right greater than left, right upper extremity swelling Abdomen: rounded, taut, mild TTP, ascites present; not rigid Extremities: 2+ pitting edema bilaterally, warm  Neuro: Drowsy and tired appearing, looks at provider and attempts to verbalize with questions but quickly becomes inattentive again, globally very weak but  no focal deficit GU: foley  Resolved problem list   Assessment and Plan  #Shock mixed septic and cardiogenic secondary to peritonitis and RV dysfunction #Ascites #AKI on CKD 3B, cardiorenal and septic ATN #Hyperkalemia #AGMA #MDS #CLL #Chronic  anemia #Hyperuricemia #DVT, acute  Patient initially admitted for septic and cardiogenic shock secondary to SBP.  He does have severe pulmonary hypertension, RV dysfunction and severe tricuspid regurgitation complicating his shock picture.  He has been treated on linezolid and Zosyn with no significant improvement.  He had stress dose steroids restarted yesterday which has not improved his vasopressor requirement which is only increase since yesterday.  He has acute on chronic renal failure which is likely cardiorenal and septic ATN with insignificant urine output, not improving with diuresis.  Unfortunately he is not a inotropic or MCS candidate and he is not a dialysis candidate.  Given his ongoing decline throughout this admission, feel that he is appropriate for comfort measures.  Myself and palliative medicine had bedside discussion yesterday and he was made DNR/DNI.  On 04-16-2024 when I evaluated him and he appears to be actively dying.  He is having increasing oxygen and vasopressor requirement.  He is moderately labored in his breathing which is irregular and he is somewhat inattentive.  His wife is at bedside.  Please see IPAL note.  Decision made to transition him to comfort measures.  His children are on the way so we will continue vasopressor support until they get here and then transition fully to comfort measures.  Labs   CBC: Recent Labs  Lab 03/26/24 0820 03/26/24 1044 03/26/24 1047 03/27/24 0409 03/28/24 0225 03/28/24 1617 03/29/24 0430 03/30/24 0252  WBC 55.3* 54.5*  --  49.8* 62.6* 55.9* 61.0* 55.6*  NEUTROABS 49.8* 51.2*  --   --  53.3* 48.7*  --  44.8*  HGB 9.4* 9.6*   < > 9.2* 8.0* 7.1* 8.0* 7.5*  HCT 29.4* 31.4*   < > 29.8* 26.0* 23.2* 24.8* 24.1*  MCV 92.7 97.8  --  97.1 95.9 97.5 92.2 94.9  PLT 113* 115*  --  108* 79* 66* 70* 86*   < > = values in this interval not displayed.    Basic Metabolic Panel: Recent Labs  Lab 03/26/24 1103 03/27/24 0409 03/28/24 0225  03/28/24 1617 03/29/24 0430 03/29/24 1214 03/30/24 0252 16-Apr-2024 0731  NA  --  139   < > 137 135 135 134* 127*  K  --  4.0   < > 4.6 4.4 4.4 4.5 6.0*  CL  --  102   < > 101 100 99 97* 91*  CO2  --  19*   < > 18* 18* 17* 17* 10*  GLUCOSE  --  92   < > 129* 137* 112* 97 136*  BUN  --  47*   < > 63* 67* 68* 70* 80*  CREATININE  --  2.74*   < > 4.18* 4.17* 4.20* 4.48* 5.19*  CALCIUM  --  8.5*   < > 7.7* 7.5* 7.4* 7.2* 6.7*  MG  --  2.2  --   --   --  2.1  --   --   PHOS 4.3  --   --   --   --   --   --  8.7*   < > = values in this interval not displayed.   GFR: Estimated Creatinine Clearance: 12.1 mL/min (A) (by C-G formula based on SCr of 5.19 mg/dL (H)). Recent Labs  Lab  03/26/24 1046 03/26/24 1351 03/27/24 0409 03/28/24 0225 03/28/24 1617 03/28/24 1923 03/29/24 0430 03/30/24 0252  PROCALCITON  --   --  3.13  --   --   --   --   --   WBC  --   --  49.8* 62.6* 55.9*  --  61.0* 55.6*  LATICACIDVEN 1.3 1.4  --   --  1.9 1.8  --   --     Liver Function Tests: Recent Labs  Lab 03/26/24 0909 03/26/24 1044 03/27/24 0409 03/27/24 1513 03/28/24 1120 03/28/24 1617 03/29/24 0430  AST 79* 108* 96*  --   --  91* 96*  ALT 32 42 33  --   --  30 31  ALKPHOS 397* 516* 465*  --   --  492* 524*  BILITOT 5.5* 4.9* 4.3*  --   --  4.2* 4.8*  PROT 5.2* 5.9* 5.4*  --   --  5.2* 5.3*  ALBUMIN 3.0* 3.3* 3.0* 2.9* 2.7* 2.8* 2.9*   Recent Labs  Lab 03/26/24 1044  LIPASE 77*   Recent Labs  Lab 03/26/24 1044  AMMONIA 25    ABG    Component Value Date/Time   PHART 7.378 04/23/2023 0955   PCO2ART 35.5 04/23/2023 0955   PO2ART 78 (L) 04/23/2023 0955   HCO3 23.1 04/23/2023 0955   HCO3 20.9 04/23/2023 0955   TCO2 21 (L) 03/26/2024 1047   ACIDBASEDEF 2.0 04/23/2023 0955   ACIDBASEDEF 4.0 (H) 04/23/2023 0955   O2SAT 68 04/23/2023 0955   O2SAT 95 04/23/2023 0955     Coagulation Profile: Recent Labs  Lab 03/26/24 1044  INR 1.2    Cardiac Enzymes: No results for input(s):  CKTOTAL, CKMB, CKMBINDEX, TROPONINI in the last 168 hours.  HbA1C: No results found for: HGBA1C  CBG: No results for input(s): GLUCAP in the last 168 hours.  Review of Systems:   As above  Past Medical History:  He,  has a past medical history of Arthritis, Enlarged prostate, Headache, and Hypertension.   Surgical History:   Past Surgical History:  Procedure Laterality Date   BACK SURGERY  2002   CORONARY PRESSURE/FFR STUDY N/A 04/23/2023   Procedure: CORONARY PRESSURE/FFR STUDY;  Surgeon: Elmira Newman PARAS, MD;  Location: [facility];  Service: Cardiovascular;  Laterality: N/A;   ESOPHAGOGASTRODUODENOSCOPY N/A 02/27/2024   Procedure: EGD (ESOPHAGOGASTRODUODENOSCOPY);  Surgeon: Elicia Claw, MD;  Location: [facility];  Service: Gastroenterology;  Laterality: N/A;   JOINT REPLACEMENT     KNEE ARTHROSCOPY Left 05/2004   thelbert 09/27/2010   RIGHT/LEFT HEART CATH AND CORONARY ANGIOGRAPHY N/A 04/23/2023   Procedure: RIGHT/LEFT HEART CATH AND CORONARY ANGIOGRAPHY;  Surgeon: Elmira Newman PARAS, MD;  Location: [facility];  Service: Cardiovascular;  Laterality: N/A;   SHOULDER ARTHROSCOPY WITH OPEN ROTATOR CUFF REPAIR Right 2009   /notes 09/13/2010   TOTAL HIP ARTHROPLASTY Left 10/02/2017   TOTAL HIP ARTHROPLASTY Left 10/02/2017   Procedure: LEFT TOTAL HIP ARTHROPLASTY ANTERIOR APPROACH;  Surgeon: Liam Lerner, MD;  Location: [facility];  Service: Orthopedics;  Laterality: Left;   TOTAL KNEE ARTHROPLASTY  04/16/2011   Procedure: TOTAL KNEE ARTHROPLASTY;  Surgeon: Lerner PARAS Liam;  Location: [facility];  Service: Orthopedics;  Laterality: Left;  LEFT TOTAL KNEE ARTHROPLASTY    TOTAL KNEE ARTHROPLASTY Right 04/19/2014   Procedure: RIGHT TOTAL KNEE ARTHROPLASTY;  Surgeon: Lerner PARAS Liam, MD;  Location: [facility];  Service: Orthopedics;  Laterality: Right;     Social History:  reports that he quit smoking about 49 years ago. His smoking use included cigarettes. He started  smoking about 59 years ago. He has a 10 pack-year smoking history. He has never used smokeless tobacco. He reports that he does not drink alcohol and does not use drugs.   Family History:  His family history includes Cancer in his sister and sister; Migraines in his mother.   Allergies No Known Allergies   Home Medications  Prior to Admission medications   Medication Sig Start Date End Date Taking? Authorizing Provider  acetaminophen  (TYLENOL ) 650 MG CR tablet Take 1,300 mg by mouth every 8 (eight) hours as needed for pain.   Yes [provider]  amLODipine  (NORVASC ) 10 MG tablet Take 10 mg by mouth daily.   Yes [provider]  aspirin  EC (ASPIRIN  LOW DOSE) 81 MG tablet TAKE 1 TABLET (81 MG TOTAL) BY MOUTH DAILY. SWALLOW WHOLE. 09/05/23  Yes Patwardhan, Manish J, MD  atorvastatin (LIPITOR) 10 MG tablet Take 10 mg by mouth daily. 12/22/21  Yes [provider]  Azelastine HCl 137 MCG/SPRAY SOLN Place 1 spray into both nostrils every 12 (twelve) hours as needed (for allergies or rhinitis). 05/15/23  Yes [provider]  brimonidine  (ALPHAGAN ) 0.2 % ophthalmic solution Place 1 drop into the left eye 2 (two) times daily.   Yes [provider]  dorzolamide -timolol  (COSOPT ) 2-0.5 % ophthalmic solution Place 1 drop into the left eye 2 (two) times daily.   Yes [provider]  empagliflozin (JARDIANCE) 10 MG TABS tablet Take 10 mg by mouth daily.   Yes [provider]  isosorbide  mononitrate (IMDUR ) 30 MG 24 hr tablet Take 0.5 tablets (15 mg total) by mouth daily. You can take an additional 15 mg as needed for chest pain. 05/17/23 02/26/24 Yes Wyn Jackee VEAR Mickey., NP  lisinopril  (ZESTRIL ) 20 MG tablet Take 1 tablet (20 mg total) by mouth at bedtime. 05/29/23  Yes Wyn Jackee VEAR Mickey., NP  nitroGLYCERIN  (NITROSTAT ) 0.4 MG SL tablet Place 1 tablet (0.4 mg total) under the tongue every 5 (five) minutes as needed for chest pain. 04/17/23  Yes Patwardhan,  Manish J, MD  pantoprazole  (PROTONIX ) 40 MG tablet Take 1 tablet (40 mg total) by mouth 2 (two) times daily for 56 days, THEN 1 tablet (40 mg total) daily for 28 days. 03/08/24 05/31/24 Yes Austria, Eric J, DO  sodium bicarbonate 650 MG tablet Take 1 tablet (650 mg total) by mouth 2 (two) times daily. 03/08/24 06/06/24 Yes Austria, Eric J, DO  tamsulosin  (FLOMAX ) 0.4 MG CAPS capsule Take 0.4 mg by mouth at bedtime.   Yes [provider]  latanoprost  (XALATAN ) 0.005 % ophthalmic solution Place 1 drop into the left eye at bedtime. Patient not taking: Reported on 03/27/2024    [provider]  lidocaine  (LIDODERM ) 5 % Place 1 patch onto the skin daily as needed (for pain- Remove & Discard patch within 12 hours or as directed by MD). Patient not taking: Reported on 03/27/2024    [provider]    Critical care time: 61  The patient is critically ill with multiple organ system failure and requires high complexity decision making for assessment and support, frequent evaluation and titration of therapies, advanced monitoring, review of radiographic studies and interpretation of complex data.    Critical Care Time devoted to patient care services, exclusive of separately billable procedures, described in this note is 41  Aijalon Demuro FORBES Furth, PA-C [facility] 04/26/2024 8:56 AM  Please see [email or web address] for pager details.  From 7A-7P if no response, please call [phone] After hours, please call ELink [phone]

## 2024-04-13 DEATH — deceased

## 2024-04-24 LAB — FLOW CYTOMETRY

## 2024-05-06 ENCOUNTER — Telehealth: Payer: Self-pay

## 2024-05-06 NOTE — Telephone Encounter (Signed)
 Pt spouse ( Mrs. Telford) came into the office to pick up a Cancer Claim form, that has been completed for her husband on 05/06/2024.

## 2024-06-03 ENCOUNTER — Other Ambulatory Visit: Payer: Self-pay
